# Patient Record
Sex: Female | Born: 1965
Health system: Southern US, Community
[De-identification: ages and names within clinical notes are randomized; demographics above are authoritative.]

## PROBLEM LIST (undated history)

## (undated) DIAGNOSIS — R251 Tremor, unspecified: Secondary | ICD-10-CM

## (undated) DIAGNOSIS — F32A Depression, unspecified: Secondary | ICD-10-CM

## (undated) DIAGNOSIS — R519 Headache, unspecified: Secondary | ICD-10-CM

## (undated) DIAGNOSIS — Z87448 Personal history of other diseases of urinary system: Secondary | ICD-10-CM

## (undated) DIAGNOSIS — T4145XA Adverse effect of unspecified anesthetic, initial encounter: Secondary | ICD-10-CM

## (undated) DIAGNOSIS — G709 Myoneural disorder, unspecified: Secondary | ICD-10-CM

## (undated) DIAGNOSIS — G629 Polyneuropathy, unspecified: Secondary | ICD-10-CM

## (undated) DIAGNOSIS — R945 Abnormal results of liver function studies: Secondary | ICD-10-CM

## (undated) DIAGNOSIS — A6920 Lyme disease, unspecified: Secondary | ICD-10-CM

## (undated) DIAGNOSIS — G8929 Other chronic pain: Secondary | ICD-10-CM

## (undated) DIAGNOSIS — R51 Headache: Secondary | ICD-10-CM

## (undated) DIAGNOSIS — D472 Monoclonal gammopathy: Secondary | ICD-10-CM

## (undated) DIAGNOSIS — M545 Low back pain: Secondary | ICD-10-CM

## (undated) DIAGNOSIS — K219 Gastro-esophageal reflux disease without esophagitis: Secondary | ICD-10-CM

## (undated) DIAGNOSIS — G4769 Other sleep related movement disorders: Secondary | ICD-10-CM

## (undated) DIAGNOSIS — C50311 Malignant neoplasm of lower-inner quadrant of right female breast: Secondary | ICD-10-CM

## (undated) DIAGNOSIS — E785 Hyperlipidemia, unspecified: Secondary | ICD-10-CM

## (undated) DIAGNOSIS — R112 Nausea with vomiting, unspecified: Secondary | ICD-10-CM

## (undated) DIAGNOSIS — Z923 Personal history of irradiation: Secondary | ICD-10-CM

## (undated) DIAGNOSIS — R7989 Other specified abnormal findings of blood chemistry: Secondary | ICD-10-CM

## (undated) DIAGNOSIS — F419 Anxiety disorder, unspecified: Secondary | ICD-10-CM

## (undated) DIAGNOSIS — R0600 Dyspnea, unspecified: Secondary | ICD-10-CM

## (undated) DIAGNOSIS — I671 Cerebral aneurysm, nonruptured: Secondary | ICD-10-CM

## (undated) DIAGNOSIS — Z800 Family history of malignant neoplasm of digestive organs: Secondary | ICD-10-CM

## (undated) DIAGNOSIS — N189 Chronic kidney disease, unspecified: Secondary | ICD-10-CM

## (undated) DIAGNOSIS — C50919 Malignant neoplasm of unspecified site of unspecified female breast: Secondary | ICD-10-CM

## (undated) DIAGNOSIS — T7840XA Allergy, unspecified, initial encounter: Secondary | ICD-10-CM

## (undated) DIAGNOSIS — M1990 Unspecified osteoarthritis, unspecified site: Secondary | ICD-10-CM

## (undated) DIAGNOSIS — T8859XA Other complications of anesthesia, initial encounter: Secondary | ICD-10-CM

## (undated) DIAGNOSIS — G4733 Obstructive sleep apnea (adult) (pediatric): Secondary | ICD-10-CM

## (undated) DIAGNOSIS — C649 Malignant neoplasm of unspecified kidney, except renal pelvis: Secondary | ICD-10-CM

## (undated) DIAGNOSIS — I10 Essential (primary) hypertension: Secondary | ICD-10-CM

## (undated) DIAGNOSIS — F329 Major depressive disorder, single episode, unspecified: Secondary | ICD-10-CM

## (undated) DIAGNOSIS — Z803 Family history of malignant neoplasm of breast: Secondary | ICD-10-CM

## (undated) DIAGNOSIS — Z8719 Personal history of other diseases of the digestive system: Secondary | ICD-10-CM

## (undated) DIAGNOSIS — R2689 Other abnormalities of gait and mobility: Secondary | ICD-10-CM

## (undated) DIAGNOSIS — G250 Essential tremor: Secondary | ICD-10-CM

## (undated) DIAGNOSIS — Z9889 Other specified postprocedural states: Secondary | ICD-10-CM

## (undated) DIAGNOSIS — K5900 Constipation, unspecified: Secondary | ICD-10-CM

## (undated) DIAGNOSIS — Z8489 Family history of other specified conditions: Secondary | ICD-10-CM

## (undated) DIAGNOSIS — R42 Dizziness and giddiness: Secondary | ICD-10-CM

## (undated) DIAGNOSIS — M797 Fibromyalgia: Secondary | ICD-10-CM

## (undated) DIAGNOSIS — K589 Irritable bowel syndrome without diarrhea: Secondary | ICD-10-CM

## (undated) DIAGNOSIS — Z1379 Encounter for other screening for genetic and chromosomal anomalies: Secondary | ICD-10-CM

## (undated) DIAGNOSIS — R269 Unspecified abnormalities of gait and mobility: Secondary | ICD-10-CM

## (undated) DIAGNOSIS — H532 Diplopia: Secondary | ICD-10-CM

## (undated) HISTORY — DX: Monoclonal gammopathy: D47.2

## (undated) HISTORY — PX: BREAST SURGERY: SHX581

## (undated) HISTORY — DX: Encounter for other screening for genetic and chromosomal anomalies: Z13.79

## (undated) HISTORY — DX: Family history of malignant neoplasm of digestive organs: Z80.0

## (undated) HISTORY — PX: KNEE ARTHROSCOPY W/ ACL RECONSTRUCTION: SHX1858

## (undated) HISTORY — PX: ABDOMINAL HYSTERECTOMY: SHX81

## (undated) HISTORY — DX: Chronic kidney disease, unspecified: N18.9

## (undated) HISTORY — DX: Other sleep related movement disorders: G47.69

## (undated) HISTORY — DX: Malignant neoplasm of unspecified site of unspecified female breast: C50.919

## (undated) HISTORY — DX: Hyperlipidemia, unspecified: E78.5

## (undated) HISTORY — DX: Low back pain: M54.5

## (undated) HISTORY — PX: WRIST SURGERY: SHX841

## (undated) HISTORY — DX: Other chronic pain: G89.29

## (undated) HISTORY — PX: BLADDER SURGERY: SHX569

## (undated) HISTORY — DX: Lyme disease, unspecified: A69.20

## (undated) HISTORY — DX: Malignant neoplasm of lower-inner quadrant of right female breast: C50.311

## (undated) HISTORY — DX: Unspecified osteoarthritis, unspecified site: M19.90

## (undated) HISTORY — DX: Essential tremor: G25.0

## (undated) HISTORY — DX: Myoneural disorder, unspecified: G70.9

## (undated) HISTORY — PX: KNEE ARTHROSCOPY: SHX127

## (undated) HISTORY — DX: Diplopia: H53.2

## (undated) HISTORY — PX: BREAST BIOPSY: SHX20

## (undated) HISTORY — DX: Family history of malignant neoplasm of breast: Z80.3

## (undated) HISTORY — PX: OTHER SURGICAL HISTORY: SHX169

## (undated) HISTORY — DX: Unspecified abnormalities of gait and mobility: R26.9

## (undated) HISTORY — DX: Allergy, unspecified, initial encounter: T78.40XA

## (undated) HISTORY — DX: Dizziness and giddiness: R42

## (undated) HISTORY — PX: FRACTURE SURGERY: SHX138

## (undated) HISTORY — PX: BONE GRAFT HIP ILIAC CREST: SUR159

## (undated) HISTORY — PX: BREAST LUMPECTOMY: SHX2

## (undated) HISTORY — PX: CHOLECYSTECTOMY: SHX55

---

## 1998-12-02 ENCOUNTER — Other Ambulatory Visit: Admission: RE | Admit: 1998-12-02 | Discharge: 1998-12-02 | Payer: Self-pay

## 1999-12-21 ENCOUNTER — Encounter: Payer: Self-pay | Admitting: Family Medicine

## 1999-12-21 ENCOUNTER — Encounter: Admission: RE | Admit: 1999-12-21 | Discharge: 1999-12-21 | Payer: Self-pay | Admitting: Family Medicine

## 2000-08-30 ENCOUNTER — Encounter: Admission: RE | Admit: 2000-08-30 | Discharge: 2000-08-30 | Payer: Self-pay | Admitting: Family Medicine

## 2000-08-30 ENCOUNTER — Encounter: Payer: Self-pay | Admitting: Family Medicine

## 2001-11-07 ENCOUNTER — Other Ambulatory Visit: Admission: RE | Admit: 2001-11-07 | Discharge: 2001-11-07 | Payer: Self-pay | Admitting: Family Medicine

## 2002-01-30 ENCOUNTER — Encounter: Payer: Self-pay | Admitting: Family Medicine

## 2002-01-30 ENCOUNTER — Encounter: Admission: RE | Admit: 2002-01-30 | Discharge: 2002-01-30 | Payer: Self-pay | Admitting: Family Medicine

## 2002-02-02 ENCOUNTER — Encounter: Payer: Self-pay | Admitting: Family Medicine

## 2002-02-02 ENCOUNTER — Encounter: Admission: RE | Admit: 2002-02-02 | Discharge: 2002-02-02 | Payer: Self-pay | Admitting: Family Medicine

## 2005-05-02 ENCOUNTER — Emergency Department (HOSPITAL_COMMUNITY): Admission: EM | Admit: 2005-05-02 | Discharge: 2005-05-02 | Payer: Self-pay | Admitting: Family Medicine

## 2005-08-27 ENCOUNTER — Ambulatory Visit (HOSPITAL_COMMUNITY): Admission: RE | Admit: 2005-08-27 | Discharge: 2005-08-28 | Payer: Self-pay | Admitting: Surgery

## 2006-04-16 ENCOUNTER — Ambulatory Visit: Admission: RE | Admit: 2006-04-16 | Discharge: 2006-04-16 | Payer: Self-pay | Admitting: Family Medicine

## 2006-05-01 ENCOUNTER — Ambulatory Visit: Payer: Self-pay | Admitting: Pulmonary Disease

## 2007-02-07 ENCOUNTER — Ambulatory Visit (HOSPITAL_COMMUNITY): Admission: RE | Admit: 2007-02-07 | Discharge: 2007-02-08 | Payer: Self-pay | Admitting: Specialist

## 2012-02-12 ENCOUNTER — Emergency Department (HOSPITAL_COMMUNITY)
Admission: EM | Admit: 2012-02-12 | Discharge: 2012-02-12 | Disposition: A | Discharge status: Home / Self Care | Payer: Self-pay | Attending: Emergency Medicine | Admitting: Emergency Medicine

## 2012-02-12 ENCOUNTER — Encounter (HOSPITAL_COMMUNITY): Payer: Self-pay | Admitting: *Deleted

## 2012-02-12 ENCOUNTER — Emergency Department (HOSPITAL_COMMUNITY): Payer: Self-pay

## 2012-02-12 DIAGNOSIS — M25519 Pain in unspecified shoulder: Secondary | ICD-10-CM | POA: Insufficient documentation

## 2012-02-12 DIAGNOSIS — W010XXA Fall on same level from slipping, tripping and stumbling without subsequent striking against object, initial encounter: Secondary | ICD-10-CM | POA: Insufficient documentation

## 2012-02-12 DIAGNOSIS — S43401A Unspecified sprain of right shoulder joint, initial encounter: Secondary | ICD-10-CM

## 2012-02-12 DIAGNOSIS — G4733 Obstructive sleep apnea (adult) (pediatric): Secondary | ICD-10-CM | POA: Insufficient documentation

## 2012-02-12 DIAGNOSIS — IMO0002 Reserved for concepts with insufficient information to code with codable children: Secondary | ICD-10-CM | POA: Insufficient documentation

## 2012-02-12 DIAGNOSIS — F329 Major depressive disorder, single episode, unspecified: Secondary | ICD-10-CM | POA: Insufficient documentation

## 2012-02-12 DIAGNOSIS — IMO0001 Reserved for inherently not codable concepts without codable children: Secondary | ICD-10-CM | POA: Insufficient documentation

## 2012-02-12 DIAGNOSIS — F3289 Other specified depressive episodes: Secondary | ICD-10-CM | POA: Insufficient documentation

## 2012-02-12 DIAGNOSIS — I10 Essential (primary) hypertension: Secondary | ICD-10-CM | POA: Insufficient documentation

## 2012-02-12 HISTORY — DX: Fibromyalgia: M79.7

## 2012-02-12 HISTORY — DX: Obstructive sleep apnea (adult) (pediatric): G47.33

## 2012-02-12 HISTORY — DX: Essential (primary) hypertension: I10

## 2012-02-12 HISTORY — DX: Depression, unspecified: F32.A

## 2012-02-12 HISTORY — DX: Major depressive disorder, single episode, unspecified: F32.9

## 2012-02-12 MED ORDER — HYDROCODONE-ACETAMINOPHEN 5-325 MG PO TABS
1.0000 | ORAL_TABLET | Freq: Once | ORAL | Status: AC
  Administered 2012-02-12: 1 via ORAL
  Filled 2012-02-12: qty 1

## 2012-02-12 MED ORDER — HYDROCODONE-ACETAMINOPHEN 5-325 MG PO TABS: 1.0000 | ORAL_TABLET | ORAL | Status: AC | PRN

## 2012-02-12 NOTE — Discharge Instructions (Signed)
Joint Sprain A sprain is a tear or stretch in the ligaments that hold a joint together. Severe sprains may need as long as 3-6 weeks of immobilization and/or exercises to heal completely. Sprained joints should be rested and protected. If not, they can become unstable and prone to re-injury. Proper treatment can reduce your pain, shorten the period of disability, and reduce the risk of repeated injuries. TREATMENT   Rest and elevate the injured joint to reduce pain and swelling.   Apply ice packs to the injury for 20-30 minutes every 2-3 hours for the next 2-3 days.   Keep the injury wrapped in a compression bandage or splint as long as the joint is painful or as instructed by your caregiver.   Do not use the injured joint until it is completely healed to prevent re-injury and chronic instability. Follow the instructions of your caregiver.   Long-term sprain management may require exercises and/or treatment by a physical therapist. Taping or special braces may help stabilize the joint until it is completely better.  SEEK MEDICAL CARE IF:   You develop increased pain or swelling of the joint.   You develop increasing redness and warmth of the joint.   You develop a fever.   It becomes stiff.   Your hand or foot gets cold or numb.  Document Released: 11/15/2004 Document Revised: 09/27/2011 Document Reviewed: 10/25/2008 [facility] Patient Information 2012 [place], [place].   Using medication prescribed for pain, use caution as this will make you drowsy.  Do not drive within 4 taking hydrocodone.  Use the sling for comfort and continue using ice to your shoulder for the next 24 hours as much as is comfortable.  Called Dr. Hilda Lias for further management if your symptoms persist beyond the next week.  Your x-rays are negative today.

## 2012-02-12 NOTE — ED Notes (Signed)
Pt c/o pain in her right shoulder and upper arm. States that it is difficult to raise her arm up.

## 2012-02-13 NOTE — ED Provider Notes (Signed)
History     CSN: [id number]  Arrival date & time 02/12/12  1556   First MD Initiated Contact with Patient 02/12/12 1649      Chief Complaint  Patient presents with  . Fall    (Consider location/radiation/quality/duration/timing/severity/associated sxs/prior treatment) HPI Comments: Patient fell this am while attempting to walk through her dark room to use the bathroom.  She landed on her right side with her right arm outstretched above her.  She has increasing pain and decreased ROM of her right shoulder.  Pain is constant ,  Throbbing and does not radiate.  She denies weakness in her forearm or hand,  No numbness or tingling.  She denies any other injury except for minor abrasion on right volar forearm.  Patient is a 46 y.o. female presenting with fall. The history is provided by the patient.  Fall    Past Medical History  Diagnosis Date  . Fibromyalgia   . Depression   . OSA (obstructive sleep apnea)   . Hypertension     Past Surgical History  Procedure Date  . Bone graft hip iliac crest   . Abdominal hysterectomy   . Bladder surgery   . Knee arthroscopy w/ acl reconstruction     right  . Cholecystectomy     History reviewed. No pertinent family history.  History  Substance Use Topics  . Smoking status: Never Smoker   . Smokeless tobacco: Not on file  . Alcohol Use: No    OB History    Grav Para Term Preterm Abortions TAB SAB Ect Mult Living                  Review of Systems  Musculoskeletal: Positive for arthralgias. Negative for joint swelling.  Skin: Negative for wound.  Neurological: Negative for weakness.  All other systems reviewed and are negative.    Allergies  Altace; Erythromycin; Minocin; Shrimp; Drixoral; and Sinutab  Home Medications   Current Outpatient Rx  Name Route Sig Dispense Refill  . ACETAMINOPHEN 500 MG PO TABS Oral Take 500 mg by mouth as needed. For pain    . ATENOLOL 50 MG PO TABS Oral Take 50 mg by mouth daily.    Marland Kitchen  CETIRIZINE HCL 10 MG PO TABS Oral Take 10 mg by mouth daily.    Marland Kitchen CLONAZEPAM 1 MG PO TABS Oral Take 1 mg by mouth 2 (two) times daily as needed. For anxiety    . FLUOXETINE HCL 20 MG PO CAPS Oral Take 20 mg by mouth daily.    Marland Kitchen HYDROCHLOROTHIAZIDE 25 MG PO TABS Oral Take 25 mg by mouth daily.    . IBUPROFEN 200 MG PO TABS Oral Take 400 mg by mouth as needed. For pain    . TRAMADOL HCL 50 MG PO TABS Oral Take 50-100 mg by mouth every 6 (six) hours as needed. Every 4 to 6 hours as needed for pain    . HYDROCODONE-ACETAMINOPHEN 5-325 MG PO TABS Oral Take 1 tablet by mouth every 4 (four) hours as needed for pain. 15 tablet 0    BP 127/87  Pulse 99  Temp(Src) 98.4 F (36.9 C) (Oral)  Resp 20  Ht 5' 8.5" (1.74 m)  Wt 212 lb (96.163 kg)  BMI 31.77 kg/m2  SpO2 96%  Physical Exam  Nursing note and vitals reviewed. Constitutional: She appears well-developed and well-nourished.  HENT:  Head: Normocephalic.  Cardiovascular: Normal rate and intact distal pulses.  Exam reveals no decreased pulses.  Pulses:      Radial pulses are 2+ on the right side, and 2+ on the left side.  Musculoskeletal: She exhibits tenderness. She exhibits no edema.       Right shoulder: She exhibits decreased range of motion, tenderness and decreased strength. She exhibits no swelling, no effusion, no crepitus, no deformity, no spasm and normal pulse.       ttp right upper anterior arm over upper bicep muscle through shoulder anterior bicep tendon insertion site.  Increased pain with attempted resistance to right elbow flexion,  No appreciable decreased strength.  No palpable deformity along length of bicep muscle.  Neurological: She is alert. No sensory deficit.  Skin: Skin is warm, dry and intact.    ED Course  Procedures (including critical care time)  Labs Reviewed - No data to display Dg Shoulder Right  02/12/2012  *RADIOLOGY REPORT*  Clinical Data: Fall on outstretched arm, right shoulder pain  RIGHT SHOULDER  - 2+ VIEW  Comparison: None.  Findings: Right lung apex is clear.  No fracture or dislocation. No soft tissue abnormality.  IMPRESSION: No acute osseous abnormality.  Original Report Authenticated By: Harrel Lemon, M.D.     1. Sprain of right shoulder       MDM  xrays reviewed and negative.  Suspect shoulder/bicep sprain.  Sling provided,  Rest,  Ice,  Hydrocodone.  Recheck by ortho if not improved over 1 week.        Candis Musa, PA 02/13/12 1346  Candis Musa, PA 02/13/12 1347

## 2012-02-16 NOTE — ED Provider Notes (Signed)
Medical screening examination/treatment/procedure(s) were performed by non-physician practitioner and as supervising physician I was immediately available for consultation/collaboration.   Shelda Jakes, MD 02/16/12 1902

## 2014-10-21 ENCOUNTER — Other Ambulatory Visit: Payer: Self-pay

## 2014-10-21 DIAGNOSIS — Z1231 Encounter for screening mammogram for malignant neoplasm of breast: Secondary | ICD-10-CM

## 2014-11-02 ENCOUNTER — Other Ambulatory Visit: Payer: Self-pay

## 2014-11-02 ENCOUNTER — Ambulatory Visit: Payer: Self-pay

## 2014-11-02 ENCOUNTER — Ambulatory Visit
Admission: RE | Admit: 2014-11-02 | Discharge: 2014-11-02 | Disposition: A | Discharge status: Home / Self Care | Payer: BC Managed Care – PPO | Source: Ambulatory Visit

## 2014-11-02 DIAGNOSIS — Z1231 Encounter for screening mammogram for malignant neoplasm of breast: Secondary | ICD-10-CM

## 2014-11-04 ENCOUNTER — Other Ambulatory Visit: Payer: Self-pay | Admitting: Nurse Practitioner

## 2014-11-04 DIAGNOSIS — R928 Other abnormal and inconclusive findings on diagnostic imaging of breast: Secondary | ICD-10-CM

## 2014-11-15 ENCOUNTER — Other Ambulatory Visit: Payer: Self-pay | Admitting: Nurse Practitioner

## 2014-11-15 ENCOUNTER — Ambulatory Visit
Admission: RE | Admit: 2014-11-15 | Discharge: 2014-11-15 | Disposition: A | Discharge status: Home / Self Care | Payer: BC Managed Care – PPO | Source: Ambulatory Visit | Attending: Nurse Practitioner | Admitting: Nurse Practitioner

## 2014-11-15 DIAGNOSIS — R928 Other abnormal and inconclusive findings on diagnostic imaging of breast: Secondary | ICD-10-CM

## 2014-11-25 ENCOUNTER — Ambulatory Visit
Admission: RE | Admit: 2014-11-25 | Discharge: 2014-11-25 | Disposition: A | Discharge status: Home / Self Care | Payer: BC Managed Care – PPO | Source: Ambulatory Visit | Attending: Nurse Practitioner | Admitting: Nurse Practitioner

## 2014-11-25 ENCOUNTER — Other Ambulatory Visit: Payer: Self-pay | Admitting: Nurse Practitioner

## 2014-11-25 DIAGNOSIS — R928 Other abnormal and inconclusive findings on diagnostic imaging of breast: Secondary | ICD-10-CM

## 2015-02-01 ENCOUNTER — Encounter: Payer: Self-pay | Admitting: Neurology

## 2015-02-01 ENCOUNTER — Ambulatory Visit (INDEPENDENT_AMBULATORY_CARE_PROVIDER_SITE_OTHER): Payer: BC Managed Care – PPO | Admitting: Neurology

## 2015-02-01 VITALS — BP 118/72 | HR 69 | Ht 68.5 in | Wt 237.0 lb

## 2015-02-01 DIAGNOSIS — M542 Cervicalgia: Secondary | ICD-10-CM | POA: Diagnosis not present

## 2015-02-01 DIAGNOSIS — R29818 Other symptoms and signs involving the nervous system: Secondary | ICD-10-CM

## 2015-02-01 DIAGNOSIS — G250 Essential tremor: Secondary | ICD-10-CM

## 2015-02-01 DIAGNOSIS — M25569 Pain in unspecified knee: Secondary | ICD-10-CM | POA: Diagnosis not present

## 2015-02-01 DIAGNOSIS — R7989 Other specified abnormal findings of blood chemistry: Secondary | ICD-10-CM

## 2015-02-01 DIAGNOSIS — R2689 Other abnormalities of gait and mobility: Secondary | ICD-10-CM

## 2015-02-01 DIAGNOSIS — M797 Fibromyalgia: Secondary | ICD-10-CM

## 2015-02-01 DIAGNOSIS — R945 Abnormal results of liver function studies: Secondary | ICD-10-CM

## 2015-02-01 MED ORDER — GABAPENTIN 300 MG PO CAPS: ORAL_CAPSULE | ORAL | Status: DC

## 2015-02-01 NOTE — Progress Notes (Signed)
Subjective:   Sarah Cooley was seen in consultation in the movement disorder clinic at the request of [facility], NP.  The evaluation is for tremor.  The patient is a 49 y.o. right handed female with a history of tremor.  Pt states that the tremor is in both hands and in the head.  Pt states that it started in the hands more than 10 years ago and started in the head more recently.  Friends notice the head tremor more than she does.  She states that in January cymbalta was added for fibromyalgia and IBS and fluoxetine was d/c and wellbutrin was also added.  She returned a month later and was c/o balance issues, constipation, sexual dysfunction, increased fibromyalgia pain, urinary retention.  Her meds were changed back.  The cymbalta and wellbutrin were d/c at the end of feb.  The tremor has not changed at all.  She still has concentration difficulty.  Her balance issues seem to come and go.  She still has some bladder dysfunction, but that is better.  She states that she has been on seroquel in the past and sounds like she had "uncontrollable movements."   There is a family hx of tremor in her mother.  She states that her mother has "demyelination" in her back.    Affected by caffeine:  No.  (1 large cup per day) Affected by alcohol:  No. (2-3 times per year) Affected by stress:  Yes.   Affected by fatigue:  Yes.   Spills soup if on spoon:  Yes.   Spills glass of liquid if full:  Yes.   Affects ADL's (tying shoes, brushing teeth, etc):  No. (does have some trouble with makeup)  Current/Previously tried tremor medications: n/a  Current medications that may exacerbate tremor:  n/a  I reviewed the patients MRI of the brain dated 01/08/15.  There were very rare T2 hyperintensities.  Pt worried that she has MS.    Outside reports reviewed: historical medical records, lab reports and x-ray reports.  Allergies  Allergen Reactions  . Erythromycin Other (See Comments)    GI- Upset   .  Minocycline Hcl Hives  . Ramipril Other (See Comments)    Ramipril--Caused Pancreatitis  . Shrimp [Shellfish Allergy] Swelling  . Drixoral [Brompheniramine-Pseudoeph] Rash  . Sinutab [Chlorphen-Pseudoephed-Apap] Rash    Outpatient Encounter Prescriptions as of 02/01/2015  Medication Sig  . acetaminophen (TYLENOL) 500 MG tablet Take 500 mg by mouth as needed. For pain  . atenolol (TENORMIN) 50 MG tablet Take 50 mg by mouth 2 (two) times daily.   Marland Kitchen atorvastatin (LIPITOR) 20 MG tablet   . cetirizine (ZYRTEC) 10 MG tablet Take 10 mg by mouth daily.  . clonazePAM (KLONOPIN) 1 MG tablet Take 1 mg by mouth 3 (three) times daily as needed. For anxiety  . diclofenac (VOLTAREN) 75 MG EC tablet Take 75 mg by mouth 2 (two) times daily.  . ergocalciferol (VITAMIN D2) 50000 UNITS capsule Take 50,000 Units by mouth once a week.  Marland Kitchen FLUoxetine (PROZAC) 20 MG capsule Take 40 mg by mouth daily.   Marland Kitchen gabapentin (NEURONTIN) 100 MG capsule Take 100 mg by mouth 2 (two) times daily.  . hydrochlorothiazide (HYDRODIURIL) 25 MG tablet Take 25 mg by mouth daily.  Marland Kitchen ibuprofen (ADVIL,MOTRIN) 200 MG tablet Take 400 mg by mouth as needed. For pain  . [DISCONTINUED] traMADol (ULTRAM) 50 MG tablet Take 50-100 mg by mouth every 6 (six) hours as needed. Every 4 to 6 hours  as needed for pain    Past Medical History  Diagnosis Date  . Fibromyalgia   . Depression   . OSA (obstructive sleep apnea)   . Hypertension   . Hyperlipidemia     Past Surgical History  Procedure Laterality Date  . Bone graft hip iliac crest    . Abdominal hysterectomy    . Bladder surgery    . Knee arthroscopy w/ acl reconstruction      right  . Cholecystectomy      History   Social History  . Marital Status: Married    Spouse Name: N/A  . Number of Children: N/A  . Years of Education: N/A   Occupational History  . physician office    Social History Main Topics  . Smoking status: Never Smoker   . Smokeless tobacco: Not on file    . Alcohol Use: 0.0 oz/week    0 Standard drinks or equivalent per week     Comment: 2-3 times a year  . Drug Use: No  . Sexual Activity: Not on file   Other Topics Concern  . Not on file   Social History Narrative    Family Status  Relation Status Death Age  . Mother Alive     tremor, DJD, lichen planus  . Father Alive     HTN, hyperlipidemia  . Sister Alive     SLE  . Brother Alive     healthy, depression  . Son Alive     1, bipolar  . Daughter Alive     1, healthy    Review of Systems C/o inverting words and numbers.  Complains about neck pain.  Complains about pain in the thigh.   A complete 10 system ROS was obtained and was negative apart from what is mentioned.   Objective:   VITALS:   Filed Vitals:   02/01/15 0908  BP: 118/72  Pulse: 69  Height: 5' 8.5" (1.74 m)  Weight: 237 lb (107.502 kg)  SpO2: 98%   Gen:  Appears stated age and in NAD. HEENT:  Normocephalic, atraumatic. The mucous membranes are moist. The superficial temporal arteries are without ropiness or tenderness. Cardiovascular: Regular rate and rhythm. Lungs: Clear to auscultation bilaterally. Neck: There are no carotid bruits noted bilaterally.  NEUROLOGICAL:  Orientation:  The patient is alert and oriented x 3.  Recent and remote memory are intact.  Attention span and concentration are normal.  Able to name objects and repeat without trouble.  Fund of knowledge is appropriate Cranial nerves: There is good facial symmetry. The pupils are equal round and reactive to light bilaterally. Fundoscopic exam reveals clear disc margins bilaterally. Extraocular muscles are intact and visual fields are full to confrontational testing. Speech is fluent and clear. Soft palate rises symmetrically and there is no tongue deviation. Hearing is intact to conversational tone. Tone: Tone is good throughout. Sensation: Sensation is intact to light touch and pinprick throughout (facial, trunk, extremities).  Vibration is intact at the bilateral big toe. There is no extinction with double simultaneous stimulation. There is no sensory dermatomal level identified. Coordination:  The patient has no dysdiadichokinesia or dysmetria. Motor: Strength is 5/5 in the bilateral upper and lower extremities.  Shoulder shrug is equal bilaterally.  There is no pronator drift.  There are no fasciculations noted. DTR's: Deep tendon reflexes are 2+/4 at the bilateral biceps, triceps, brachioradialis, patella and achilles.  Plantar responses are downgoing bilaterally. Gait and Station: The patient is able to ambulate  without difficulty. The patient is able to heel toe walk without any difficulty. The patient has minimal difficulty in relating in a tandem fashion. The patient is able to stand in the Romberg position.   MOVEMENT EXAM: Tremor:  There is tremor of the outstretched hands.  It is not worse with intention, and is primarily a postural tremor.  It is worse on the right than the left.  She has head tremor in the "no" direction.  There is no null point.  She has no significant difficulty with Archimedes spirals.  Tremor is evident when she pours water from one glass to another, but she does not spill it.  Labs:  Labs were reviewed from her primary care physician office.  Her AST was 63 and ALT was 81.  Patient states that because of these elevations her cholesterol medication was changed from Pravachol to Lipitor.  Her TSH was normal at 1.590.     Assessment/Plan:   1.  Essential Tremor.  -This is evidenced by the symmetrical nature and longstanding hx of gradually getting worse.  In addition, she has a family history of tremor.  She has both head and hand tremor.  I did explain to her that head tremor can be quite resistant to medication treatment.  We talked about.  His medications, and ultimately decided to just use the gabapentin that she was recently placed on for her fibromyalgia and try to increase that and  see if it would be helpful for tremor.  She would need a much higher dosage.  The other benefit of using gabapentin is that it will not further increase her liver enzymes, which were already elevated.  I gave her a schedule to titrate the medication up.  She is currently on 100 mg twice a day and over the next 3 weeks I will work her up to 300 mg in the morning and 600 mg at night.  She will let me know if she has any side effects with the medication.  Risks, benefits, side effects and alternative therapies were discussed.  The opportunity to ask questions was given and they were answered to the best of my ability.  The patient expressed understanding and willingness to follow the outlined treatment protocols.  -reviewed patients MRI brain with her.  Was a very rare focus of T2 hyperintesties, not uncommon in her age group in a patient with HTN, hyperlipidemia.  Does not look suspicious for demyelinating disease.  I told her that I see no evidence on her examination that she has demyelinating disease, which she was very worried about.  Reassurance was provided. 2.  Loss of balance.  -The patient asked me several questions about what could be causing the other issues that she complained about.  Again, I saw no evidence of any type of demyelinating disease.  Her examination was nonfocal and nonlateralizing.  I did tell her that I wonder if it is not from her clonazepam, which she has been on when necessary for a long time, but has recently been taking it 3 times a day.  This could potentially cause cognitive difficulties as well as balance changes.  I told her that we could certainly do a workup and look at her MRI of the cervical spine since she does complain about neck pain.  She would also like to pursue an EMG to make sure that there is nothing peripheral going on.  I think that is reasonable, but otherwise I see nothing else neurologic that could  be causing her complaints. 3.  Fibromyalgia  -This is  long-standing and she is under the care of her primary care physician for this. 4.  She will follow-up with me in the next few months, sooner should new neurologic issues arise.

## 2015-02-01 NOTE — Patient Instructions (Signed)
1.  Take gabapentin as follows:  100 mg in the AM, 300 mg in the Pm for a week, then 300 mg twice a day for a week, then 300 mg in the AM and 600 mg in the PM thereafter

## 2015-02-14 ENCOUNTER — Ambulatory Visit (INDEPENDENT_AMBULATORY_CARE_PROVIDER_SITE_OTHER): Payer: BC Managed Care – PPO | Admitting: Neurology

## 2015-02-14 ENCOUNTER — Telehealth: Payer: Self-pay | Admitting: Neurology

## 2015-02-14 DIAGNOSIS — R202 Paresthesia of skin: Secondary | ICD-10-CM

## 2015-02-14 DIAGNOSIS — M542 Cervicalgia: Secondary | ICD-10-CM | POA: Diagnosis not present

## 2015-02-14 DIAGNOSIS — R269 Unspecified abnormalities of gait and mobility: Secondary | ICD-10-CM

## 2015-02-14 DIAGNOSIS — M25569 Pain in unspecified knee: Secondary | ICD-10-CM

## 2015-02-14 NOTE — Telephone Encounter (Signed)
-----   Message from [place], DO sent at 02/14/2015  3:24 PM EDT ----- Please let pt know that her EMG was normal; no evidence of pinched nerves from the back.  No evidence of peripheral neuropathy.  I don't know cause of her other sx's but not likely neurologic

## 2015-02-14 NOTE — Procedures (Signed)
[facility]  [address], [place]  [place], [place] [address] Tel: [phone] Fax:  [phone] Test Date:  02/14/2015  Patient: Sarah Cooley DOB: 12-Nov-1965 Physician: Narda Amber, DO  Sex: Female Height: 5' 8.5" Ref Phys: Alonza Bogus  ID#: [id number] Temp: 34.0C Technician: Laureen Ochs R. NCS T.   Patient Complaints: Patient is a 49 year old female here for evaluaiton of both her lower extremities for balance changes and pain.  NCV & EMG Findings: Extensive electrodiagnostic testing of the left lower extremity and additional studies of the right shows:  1. Bilateral sural and superficial peroneal sensory responses are within normal limits. 2. Bilateral tibial and peroneal motor responses are within normal limits. 3. Bilateral H reflex studies are within normal limits. 4. There is no evidence of active or chronic motor axon loss changes affecting any of the tested muscles. Motor unit configuration and recruitment pattern is within normal limits.  Impression: This is a normal study. In particular, there is no evidence of a generalized large fiber sensorimotor polyneuropathy or lumbosacral radiculopathy affecting the lower extremities.   ___________________________ Narda Amber, DO    Nerve Conduction Studies Anti Sensory Summary Table   Site NR Peak (ms) Norm Peak (ms) P-T Amp (V) Norm P-T Amp  Left Sup Peroneal Anti Sensory (Ant Lat Mall)  34C  12 cm    2.6 <4.5 12.4 >5  Right Sup Peroneal Anti Sensory (Ant Lat Mall)  34C  12 cm    2.7 <4.5 12.9 >5  Left Sural Anti Sensory (Lat Mall)  34C  Calf    3.6 <4.5 8.5 >5  Right Sural Anti Sensory (Lat Mall)  34C  Calf    3.5 <4.5 10.6 >5   Motor Summary Table   Site NR Onset (ms) Norm Onset (ms) O-P Amp (mV) Norm O-P Amp Site1 Site2 Delta-0 (ms) Dist (cm) Vel (m/s) Norm Vel (m/s)  Left Peroneal Motor (Ext Dig Brev)  34C  Ankle    3.4 <5.5 4.8 >3 B Fib Ankle 7.5 35.0 47 >40  B Fib    10.9  4.6   Poplt B Fib 2.1 10.0 48 >40  Poplt    13.0  4.5         Right Peroneal Motor (Ext Dig Brev)  34C  Ankle    3.5 <5.5 6.0 >3 B Fib Ankle 7.0 35.5 51 >40  B Fib    10.5  5.7  Poplt B Fib 1.5 8.0 53 >40  Poplt    12.0  5.7         Left Tibial Motor (Abd Hall Brev)  34C  Ankle    3.7 <6.0 12.9 >8 Knee Ankle 7.9 39.0 49 >40  Knee    11.6  8.5         Right Tibial Motor (Abd Hall Brev)  34C  Ankle    3.4 <6.0 12.3 >8 Knee Ankle 8.0 41.0 51 >40  Knee    11.4  9.0          H Reflex Studies   NR H-Lat (ms) Lat Norm (ms) L-R H-Lat (ms)  Left Tibial (Gastroc)  34C     33.33 <35 0.00  Right Tibial (Gastroc)  34C     33.33 <35 0.00   EMG   Side Muscle Ins Act Fibs Psw Fasc Number Recrt Dur Dur. Amp Amp. Poly Poly. Comment  Left AntTibialis Nml Nml Nml Nml Nml Nml Nml Nml Nml Nml Nml Nml N/A  Left Gastroc Nml Nml Nml Nml Nml Nml Nml Nml Nml Nml Nml Nml N/A  Left Flex Dig Long Nml Nml Nml Nml Nml Nml Nml Nml Nml Nml Nml Nml N/A  Left RectFemoris Nml Nml Nml Nml Nml Nml Nml Nml Nml Nml Nml Nml N/A  Left GluteusMed Nml Nml Nml Nml Nml Nml Nml Nml Nml Nml Nml Nml N/A  Right AntTibialis Nml Nml Nml Nml Nml Nml Nml Nml Nml Nml Nml Nml N/A  Right Gastroc Nml Nml Nml Nml Nml Nml Nml Nml Nml Nml Nml Nml N/A  Right RectFemoris Nml Nml Nml Nml Nml Nml Nml Nml Nml Nml Nml Nml N/A      Waveforms:

## 2015-02-14 NOTE — Telephone Encounter (Signed)
Left message on machine for patient to call back.

## 2015-02-16 ENCOUNTER — Telehealth: Payer: Self-pay | Admitting: *Deleted

## 2015-02-16 NOTE — Telephone Encounter (Signed)
Left another message on machine for patient to call back.

## 2015-02-16 NOTE — Telephone Encounter (Signed)
Patient complaining of side affects since she increased her Neurontin  Call back number [phone]

## 2015-02-17 NOTE — Telephone Encounter (Signed)
Attempted to contact patient at the number she left and the mailbox is full.

## 2015-02-17 NOTE — Telephone Encounter (Signed)
Attempted to contact patient as well to find out symptoms of reaction to neurontin. Voicemail was full.

## 2015-02-17 NOTE — Telephone Encounter (Signed)
Caryl Pina, will you call her and see what SE are.  Also, give her the message that Luvenia Starch was calling her about.

## 2015-02-18 ENCOUNTER — Encounter: Payer: Self-pay | Admitting: Neurology

## 2015-02-18 ENCOUNTER — Ambulatory Visit (HOSPITAL_COMMUNITY)
Admission: RE | Admit: 2015-02-18 | Discharge: 2015-02-18 | Disposition: A | Discharge status: Home / Self Care | Payer: BC Managed Care – PPO | Source: Ambulatory Visit | Attending: Neurology | Admitting: Neurology

## 2015-02-18 DIAGNOSIS — M5022 Other cervical disc displacement, mid-cervical region: Secondary | ICD-10-CM | POA: Diagnosis not present

## 2015-02-18 DIAGNOSIS — M25569 Pain in unspecified knee: Secondary | ICD-10-CM | POA: Diagnosis not present

## 2015-02-18 DIAGNOSIS — M542 Cervicalgia: Secondary | ICD-10-CM

## 2015-02-18 DIAGNOSIS — R251 Tremor, unspecified: Secondary | ICD-10-CM | POA: Diagnosis not present

## 2015-02-18 NOTE — Telephone Encounter (Signed)
Left message for patient to call me back. 

## 2015-02-18 NOTE — Telephone Encounter (Signed)
Letter mailed to patient with results.

## 2015-02-21 ENCOUNTER — Telehealth: Payer: Self-pay | Admitting: Neurology

## 2015-02-21 NOTE — Telephone Encounter (Signed)
Mailed letter to patient with normal results due to patient not returning previous calls for EMG results. To call with any questions.

## 2015-02-21 NOTE — Telephone Encounter (Signed)
Patient did not call back with side effects of medication.  Jade mailed a letter giving patient other information.

## 2015-02-21 NOTE — Telephone Encounter (Signed)
-----   Message from La Conner, DO sent at 02/18/2015  3:49 PM EDT ----- Let pt know that her MRI of the cervical spine is unremarkable

## 2015-04-01 ENCOUNTER — Telehealth: Payer: Self-pay | Admitting: Neurology

## 2015-04-01 MED ORDER — GABAPENTIN 600 MG PO TABS: 600.0000 mg | ORAL_TABLET | Freq: Two times a day (BID) | ORAL | Status: DC

## 2015-04-01 NOTE — Telephone Encounter (Signed)
Spoke with patient. She states she is currently on Gabapentin 300 mg in the morning, 600 mg at night. She is not having side effects from medication. Previous phone call which stated she was having side effects she believes was from EMG - she had severe sciatic pain down her leg. She was placed on a course of Prednisone by her PCP and that is better. She states that Gabapentin isn't doing much for the tremor in her hands, arms, and head. It is sometimes better, but most of the time the same. She was asking about an alternative medication. Her PCP advised her to ask about Requip. If she is to remain on this dose of Gabapentin she will need a refill with the current dosage instructions called in. Please advise.

## 2015-04-01 NOTE — Telephone Encounter (Signed)
Pt called to say that the medication Gabapentin is not working for her, would like a call back at [phone] Ext:[phone]/Dawn

## 2015-04-01 NOTE — Telephone Encounter (Signed)
i'm not sure what the requip would be for.  She doesn't have PD, which is what requip helps with.  She just has essential tremor.  She can try to increase the dose of gabapentin to 600 mg bid and you can send new RX if you would like.

## 2015-04-01 NOTE — Telephone Encounter (Signed)
Notified patient of advisement. She is agreeable to try increase of Gabapentin. New Rx sent to her pharmacy.

## 2015-05-06 ENCOUNTER — Encounter: Payer: Self-pay | Admitting: Neurology

## 2015-05-06 ENCOUNTER — Ambulatory Visit (INDEPENDENT_AMBULATORY_CARE_PROVIDER_SITE_OTHER): Payer: BC Managed Care – PPO | Admitting: Neurology

## 2015-05-06 VITALS — BP 100/70 | HR 76 | Ht 68.0 in | Wt 239.0 lb

## 2015-05-06 DIAGNOSIS — G250 Essential tremor: Secondary | ICD-10-CM | POA: Diagnosis not present

## 2015-05-06 DIAGNOSIS — M797 Fibromyalgia: Secondary | ICD-10-CM | POA: Diagnosis not present

## 2015-05-06 DIAGNOSIS — R29818 Other symptoms and signs involving the nervous system: Secondary | ICD-10-CM

## 2015-05-06 DIAGNOSIS — R2689 Other abnormalities of gait and mobility: Secondary | ICD-10-CM

## 2015-05-06 MED ORDER — PRIMIDONE 50 MG PO TABS: 50.0000 mg | ORAL_TABLET | Freq: Every day | ORAL | Status: DC

## 2015-05-06 NOTE — Progress Notes (Signed)
Subjective:   Sarah Cooley was seen in consultation in the movement disorder clinic at the request of [facility], NP.  The evaluation is for tremor.  The patient is a 49 y.o. right handed female with a history of tremor.  Pt states that the tremor is in both hands and in the head.  Pt states that it started in the hands more than 10 years ago and started in the head more recently.  Friends notice the head tremor more than she does.  She states that in January cymbalta was added for fibromyalgia and IBS and fluoxetine was d/c and wellbutrin was also added.  She returned a month later and was c/o balance issues, constipation, sexual dysfunction, increased fibromyalgia pain, urinary retention.  Her meds were changed back.  The cymbalta and wellbutrin were d/c at the end of feb.  The tremor has not changed at all.  She still has concentration difficulty.  Her balance issues seem to come and go.  She still has some bladder dysfunction, but that is better.  She states that she has been on seroquel in the past and sounds like she had "uncontrollable movements."   There is a family hx of tremor in her mother.  She states that her mother has "demyelination" in her back.    Affected by caffeine:  No.  (1 large cup per day) Affected by alcohol:  No. (2-3 times per year) Affected by stress:  Yes.   Affected by fatigue:  Yes.   Spills soup if on spoon:  Yes.   Spills glass of liquid if full:  Yes.   Affects ADL's (tying shoes, brushing teeth, etc):  No. (does have some trouble with makeup)  05/06/15 update:  The patient returns today for follow-up.   She is accompanied by her husband who supplements the hx.   The patient has a history of essential tremor.  Last visit, she was on low-dose gabapentin for fibromyalgia and I was reluctant to add further medication because of elevated liver enzymes.  Therefore, I increased her gabapentin in hopes that it would help her essential tremor.  She called me on  June 10 and stated that it was not helping.  She stated that her primary care for providers told her to ask me about Requip.  I explained to her that Requip was only helpful for patients who have parkinsonian tremor, which she does not have.  I did increase her gabapentin, however, to 600 mg twice per day.  Today, the patient states that this has really helped.  It has not helped her fibromyalgia either.  Last visit, the patient was also complaining about balance difficulties.  I thought that this was likely from her clonazepam, which she was taking 3 times per day.  However, the patient wanted to explore this further.  She had an EMG on 02/14/2015.  This was normal.  Specifically, there was no evidence of radiculopathy or peripheral neuropathy.  She also had an unremarkable MRI of the cervical spine.  She had previously already had an MRI of the brain.  I explained to her that I did not feel her balance changes were a primary neurologic problem.  She continues to c/o balance issues and falling.  She states that she has had some bradycardia.  She has had a normal EKG and has been altering her dosage/timing of her atenolol because of that.  States that she has been having vision change as well.  Saw her eye doctor  and noted no problems.  Current/Previously tried tremor medications: n/a  Current medications that may exacerbate tremor:  n/a  I reviewed the patients MRI of the brain dated 01/08/15.  There were very rare T2 hyperintensities.  Pt worried that she has MS.    Outside reports reviewed: historical medical records, lab reports and x-ray reports.  Allergies  Allergen Reactions  . Erythromycin Other (See Comments)    GI- Upset   . Minocycline Hcl Hives  . Ramipril Other (See Comments)    Ramipril--Caused Pancreatitis  . Shrimp [Shellfish Allergy] Swelling  . Drixoral [Brompheniramine-Pseudoeph] Rash  . Sinutab [Chlorphen-Pseudoephed-Apap] Rash    Outpatient Encounter Prescriptions as of  05/06/2015  Medication Sig  . acetaminophen (TYLENOL) 500 MG tablet Take 500 mg by mouth as needed. For pain  . atenolol (TENORMIN) 50 MG tablet Take 25 mg by mouth daily.   Marland Kitchen atorvastatin (LIPITOR) 20 MG tablet Take 40 mg by mouth daily.   . cetirizine (ZYRTEC) 10 MG tablet Take 10 mg by mouth daily.  . clonazePAM (KLONOPIN) 1 MG tablet Take 1 mg by mouth 3 (three) times daily as needed. For anxiety  . diclofenac (VOLTAREN) 75 MG EC tablet Take 75 mg by mouth 2 (two) times daily.  Marland Kitchen FLUoxetine (PROZAC) 20 MG capsule Take 60 mg by mouth daily.   Marland Kitchen gabapentin (NEURONTIN) 600 MG tablet Take 1 tablet (600 mg total) by mouth 2 (two) times daily.  . hydrochlorothiazide (HYDRODIURIL) 25 MG tablet Take 25 mg by mouth daily.  Marland Kitchen ibuprofen (ADVIL,MOTRIN) 200 MG tablet Take 400 mg by mouth as needed. For pain  . zolpidem (AMBIEN) 10 MG tablet Take 10 mg by mouth at bedtime.   . [DISCONTINUED] ergocalciferol (VITAMIN D2) 50000 UNITS capsule Take 50,000 Units by mouth once a week.   No facility-administered encounter medications on file as of 05/06/2015.    Past Medical History  Diagnosis Date  . Fibromyalgia   . Depression   . OSA (obstructive sleep apnea)   . Hypertension   . Hyperlipidemia     Past Surgical History  Procedure Laterality Date  . Bone graft hip iliac crest    . Abdominal hysterectomy    . Bladder surgery    . Knee arthroscopy w/ acl reconstruction      right  . Cholecystectomy      History   Social History  . Marital Status: Married    Spouse Name: N/A  . Number of Children: N/A  . Years of Education: N/A   Occupational History  . physician office    Social History Main Topics  . Smoking status: Never Smoker   . Smokeless tobacco: Not on file  . Alcohol Use: 0.0 oz/week    0 Standard drinks or equivalent per week     Comment: 2-3 times a year  . Drug Use: No  . Sexual Activity: Not on file   Other Topics Concern  . Not on file   Social History Narrative      Family Status  Relation Status Death Age  . Mother Alive     tremor, DJD, lichen planus  . Father Alive     HTN, hyperlipidemia  . Sister Alive     SLE  . Brother Alive     healthy, depression  . Son Alive     1, bipolar  . Daughter Alive     1, healthy    Review of Systems    A complete 10 system ROS was  obtained and was negative apart from what is mentioned.   Objective:   VITALS:   Filed Vitals:   05/06/15 1010  BP: 100/70  Pulse: 76  Height: 5\' 8"  (1.727 m)  Weight: 239 lb (108.41 kg)   Gen:  Appears stated age and in NAD. HEENT:  Normocephalic, atraumatic. The mucous membranes are moist. The superficial temporal arteries are without ropiness or tenderness. Cardiovascular: Regular rate and rhythm. Lungs: Clear to auscultation bilaterally. Neck: There are no carotid bruits noted bilaterally.  NEUROLOGICAL:  Orientation:  The patient is alert and oriented x 3.  Cranial nerves: There is good facial symmetry.  Speech is fluent and clear. Soft palate rises symmetrically and there is no tongue deviation. Hearing is intact to conversational tone. Tone: Tone is good throughout. Sensation: Sensation is intact to light touch throughout. Coordination:  The patient has no dysdiadichokinesia or dysmetria. Motor: Strength is 5/5 in the bilateral upper and lower extremities.  Shoulder shrug is equal bilaterally.  There is no pronator drift.  There are no fasciculations noted. Gait and Station: The patient is able to ambulate without difficulty.   MOVEMENT EXAM: Tremor:  There is minimal tremor of the outstretched hands. No intention tremor.  She has no head tremor today.    She has no significant difficulty with Archimedes spirals.   Labs:  Labs were reviewed from her primary care physician office.  Her AST was 63 and ALT was 81.  Patient states that because of these elevations her cholesterol medication was changed from Pravachol to Lipitor.  Her TSH was normal at  1.590.     Assessment/Plan:   1.  Essential Tremor.  -Clinically, I saw almost no tremor today but pt didn't feel that increasing her gabapentin that she was already on for fibromyalgia was helpful for tremor.  -Wants to try primidone, 50 mg at night.  States that her PCP did repeat LFT's and were normal.  I did call PCP for labs yesterday and got labs from 03/31/15 but this only included lipids and vitamin D.  Will try to get a copy  -decrease gabapentin to 600 mg q hs 2.  Loss of balance.  -The patient asked me several questions about what could be causing this.  I reiterated that I could not find a primary neurologic etiology for balance change.  She has had an extensive neurologic work up (MRI brain, cervical spine, EMG) and there was nothing to suggest a primary neurologic etiology for balance change.  I told her that this may be medication but she needs to f/u with PCP to discuss further. 3.  Fibromyalgia  -This is long-standing and she is under the care of her primary care physician for this. 4.  She will follow-up with me in the next few months, sooner should new neurologic issues arise.

## 2015-05-06 NOTE — Patient Instructions (Addendum)
1.  Start primidone - 50 mg - 1/2 tablet at night for 4 nights and then increase to 1 tablet at night  2.  Reduce Gabapentin to 600 mg at bedtime.

## 2015-07-17 ENCOUNTER — Emergency Department (HOSPITAL_COMMUNITY): Payer: BC Managed Care – PPO

## 2015-07-17 ENCOUNTER — Encounter (HOSPITAL_COMMUNITY): Payer: Self-pay | Admitting: *Deleted

## 2015-07-17 ENCOUNTER — Observation Stay (HOSPITAL_COMMUNITY)
Admission: EM | Admit: 2015-07-17 | Discharge: 2015-07-20 | Disposition: A | Discharge status: Home / Self Care | Payer: BC Managed Care – PPO | Attending: Emergency Medicine | Admitting: Emergency Medicine

## 2015-07-17 DIAGNOSIS — M1990 Unspecified osteoarthritis, unspecified site: Secondary | ICD-10-CM | POA: Insufficient documentation

## 2015-07-17 DIAGNOSIS — K589 Irritable bowel syndrome without diarrhea: Secondary | ICD-10-CM | POA: Diagnosis not present

## 2015-07-17 DIAGNOSIS — F0390 Unspecified dementia without behavioral disturbance: Secondary | ICD-10-CM | POA: Insufficient documentation

## 2015-07-17 DIAGNOSIS — K449 Diaphragmatic hernia without obstruction or gangrene: Secondary | ICD-10-CM | POA: Diagnosis not present

## 2015-07-17 DIAGNOSIS — G20 Parkinson's disease: Secondary | ICD-10-CM | POA: Insufficient documentation

## 2015-07-17 DIAGNOSIS — E669 Obesity, unspecified: Secondary | ICD-10-CM | POA: Diagnosis present

## 2015-07-17 DIAGNOSIS — I129 Hypertensive chronic kidney disease with stage 1 through stage 4 chronic kidney disease, or unspecified chronic kidney disease: Secondary | ICD-10-CM | POA: Insufficient documentation

## 2015-07-17 DIAGNOSIS — N189 Chronic kidney disease, unspecified: Secondary | ICD-10-CM | POA: Diagnosis not present

## 2015-07-17 DIAGNOSIS — E785 Hyperlipidemia, unspecified: Secondary | ICD-10-CM | POA: Insufficient documentation

## 2015-07-17 DIAGNOSIS — I609 Nontraumatic subarachnoid hemorrhage, unspecified: Secondary | ICD-10-CM

## 2015-07-17 DIAGNOSIS — I4891 Unspecified atrial fibrillation: Secondary | ICD-10-CM | POA: Diagnosis not present

## 2015-07-17 DIAGNOSIS — G4489 Other headache syndrome: Secondary | ICD-10-CM | POA: Diagnosis not present

## 2015-07-17 DIAGNOSIS — R519 Headache, unspecified: Secondary | ICD-10-CM | POA: Diagnosis present

## 2015-07-17 DIAGNOSIS — Z79899 Other long term (current) drug therapy: Secondary | ICD-10-CM | POA: Diagnosis not present

## 2015-07-17 DIAGNOSIS — I2510 Atherosclerotic heart disease of native coronary artery without angina pectoris: Secondary | ICD-10-CM | POA: Insufficient documentation

## 2015-07-17 DIAGNOSIS — R079 Chest pain, unspecified: Secondary | ICD-10-CM | POA: Insufficient documentation

## 2015-07-17 DIAGNOSIS — I10 Essential (primary) hypertension: Secondary | ICD-10-CM | POA: Diagnosis not present

## 2015-07-17 DIAGNOSIS — G44201 Tension-type headache, unspecified, intractable: Secondary | ICD-10-CM | POA: Diagnosis not present

## 2015-07-17 DIAGNOSIS — R51 Headache: Secondary | ICD-10-CM

## 2015-07-17 DIAGNOSIS — Q2730 Arteriovenous malformation, site unspecified: Secondary | ICD-10-CM | POA: Diagnosis not present

## 2015-07-17 DIAGNOSIS — M720 Palmar fascial fibromatosis [Dupuytren]: Secondary | ICD-10-CM | POA: Diagnosis not present

## 2015-07-17 DIAGNOSIS — R778 Other specified abnormalities of plasma proteins: Secondary | ICD-10-CM | POA: Diagnosis present

## 2015-07-17 DIAGNOSIS — I472 Ventricular tachycardia: Secondary | ICD-10-CM | POA: Insufficient documentation

## 2015-07-17 DIAGNOSIS — R42 Dizziness and giddiness: Secondary | ICD-10-CM | POA: Diagnosis present

## 2015-07-17 DIAGNOSIS — R7989 Other specified abnormal findings of blood chemistry: Secondary | ICD-10-CM | POA: Diagnosis not present

## 2015-07-17 DIAGNOSIS — K219 Gastro-esophageal reflux disease without esophagitis: Secondary | ICD-10-CM | POA: Insufficient documentation

## 2015-07-17 DIAGNOSIS — Z8701 Personal history of pneumonia (recurrent): Secondary | ICD-10-CM | POA: Insufficient documentation

## 2015-07-17 DIAGNOSIS — I5032 Chronic diastolic (congestive) heart failure: Secondary | ICD-10-CM | POA: Insufficient documentation

## 2015-07-17 DIAGNOSIS — Z7982 Long term (current) use of aspirin: Secondary | ICD-10-CM | POA: Insufficient documentation

## 2015-07-17 LAB — CBC WITH DIFFERENTIAL/PLATELET
BASOS ABS: 0 10*3/uL (ref 0.0–0.1)
BASOS PCT: 0 %
EOS ABS: 0.1 10*3/uL (ref 0.0–0.7)
EOS PCT: 1 %
HEMATOCRIT: 41.5 % (ref 36.0–46.0)
Hemoglobin: 14.5 g/dL (ref 12.0–15.0)
Lymphocytes Relative: 22 %
Lymphs Abs: 1.9 10*3/uL (ref 0.7–4.0)
MCH: 32.4 pg (ref 26.0–34.0)
MCHC: 34.9 g/dL (ref 30.0–36.0)
MCV: 92.6 fL (ref 78.0–100.0)
MONO ABS: 0.5 10*3/uL (ref 0.1–1.0)
MONOS PCT: 6 %
NEUTROS ABS: 6.2 10*3/uL (ref 1.7–7.7)
Neutrophils Relative %: 71 %
PLATELETS: 201 10*3/uL (ref 150–400)
RBC: 4.48 MIL/uL (ref 3.87–5.11)
RDW: 12.8 % (ref 11.5–15.5)
WBC: 8.7 10*3/uL (ref 4.0–10.5)

## 2015-07-17 LAB — CBC
HCT: 40.8 % (ref 36.0–46.0)
Hemoglobin: 14.1 g/dL (ref 12.0–15.0)
MCH: 31.9 pg (ref 26.0–34.0)
MCHC: 34.6 g/dL (ref 30.0–36.0)
MCV: 92.3 fL (ref 78.0–100.0)
PLATELETS: 209 10*3/uL (ref 150–400)
RBC: 4.42 MIL/uL (ref 3.87–5.11)
RDW: 12.8 % (ref 11.5–15.5)
WBC: 12.1 10*3/uL — ABNORMAL HIGH (ref 4.0–10.5)

## 2015-07-17 LAB — CREATININE, SERUM
Creatinine, Ser: 0.72 mg/dL (ref 0.44–1.00)
GFR calc Af Amer: >60 mL/min (ref >60)
GFR calc non Af Amer: >60 mL/min (ref >60)

## 2015-07-17 LAB — BASIC METABOLIC PANEL
ANION GAP: 10 (ref 5–15)
BUN: 13 mg/dL (ref 6–20)
CALCIUM: 9 mg/dL (ref 8.9–10.3)
CO2: 26 mmol/L (ref 22–32)
CREATININE: 0.71 mg/dL (ref 0.44–1.00)
Chloride: 101 mmol/L (ref 101–111)
GFR calc Af Amer: >60 mL/min (ref >60)
GLUCOSE: 98 mg/dL (ref 65–99)
Potassium: 3.3 mmol/L — ABNORMAL LOW (ref 3.5–5.1)
Sodium: 137 mmol/L (ref 135–145)

## 2015-07-17 LAB — TSH: TSH: 3.845 u[IU]/mL (ref 0.350–4.500)

## 2015-07-17 LAB — GLUCOSE, CAPILLARY: Glucose-Capillary: 104 mg/dL — ABNORMAL HIGH (ref 65–99)

## 2015-07-17 LAB — TROPONIN I
TROPONIN I: 0.15 ng/mL — AB (ref <0.031)
TROPONIN I: 0.19 ng/mL — AB (ref <0.031)

## 2015-07-17 MED ORDER — DICLOFENAC SODIUM 75 MG PO TBEC
75.0000 mg | DELAYED_RELEASE_TABLET | Freq: Two times a day (BID) | ORAL | Status: DC | PRN
  Administered 2015-07-18: 75 mg via ORAL
  Filled 2015-07-17 (×2): qty 1

## 2015-07-17 MED ORDER — INFLUENZA VAC SPLIT QUAD 0.5 ML IM SUSY
0.5000 mL | PREFILLED_SYRINGE | INTRAMUSCULAR | Status: AC
  Administered 2015-07-18: 0.5 mL via INTRAMUSCULAR
  Filled 2015-07-17: qty 0.5

## 2015-07-17 MED ORDER — INSULIN ASPART 100 UNIT/ML ~~LOC~~ SOLN: 0.0000 [IU] | Freq: Every day | SUBCUTANEOUS | Status: DC

## 2015-07-17 MED ORDER — ONDANSETRON HCL 4 MG/2ML IJ SOLN: 4.0000 mg | Freq: Four times a day (QID) | INTRAMUSCULAR | Status: DC | PRN

## 2015-07-17 MED ORDER — DOCUSATE SODIUM 100 MG PO CAPS: 100.0000 mg | ORAL_CAPSULE | Freq: Every day | ORAL | Status: DC | PRN

## 2015-07-17 MED ORDER — CO Q 10 10 MG PO CAPS: 1.0000 | ORAL_CAPSULE | Freq: Every day | ORAL | Status: DC

## 2015-07-17 MED ORDER — LORATADINE 10 MG PO TABS: 10.0000 mg | ORAL_TABLET | Freq: Every day | ORAL | Status: DC

## 2015-07-17 MED ORDER — INSULIN ASPART 100 UNIT/ML ~~LOC~~ SOLN: 0.0000 [IU] | Freq: Three times a day (TID) | SUBCUTANEOUS | Status: DC

## 2015-07-17 MED ORDER — ENOXAPARIN SODIUM 40 MG/0.4ML ~~LOC~~ SOLN
40.0000 mg | SUBCUTANEOUS | Status: DC
  Administered 2015-07-17: 40 mg via SUBCUTANEOUS
  Filled 2015-07-17: qty 0.4

## 2015-07-17 MED ORDER — MORPHINE SULFATE (PF) 4 MG/ML IV SOLN
4.0000 mg | Freq: Once | INTRAVENOUS | Status: AC
  Administered 2015-07-17: 4 mg via INTRAVENOUS
  Filled 2015-07-17: qty 1

## 2015-07-17 MED ORDER — CLONIDINE HCL 0.1 MG PO TABS
0.1000 mg | ORAL_TABLET | Freq: Once | ORAL | Status: DC
  Filled 2015-07-17: qty 1

## 2015-07-17 MED ORDER — PRIMIDONE 50 MG PO TABS
50.0000 mg | ORAL_TABLET | Freq: Every day | ORAL | Status: DC
  Administered 2015-07-17 – 2015-07-19 (×3): 50 mg via ORAL
  Filled 2015-07-17 (×5): qty 1

## 2015-07-17 MED ORDER — FLUOXETINE HCL 20 MG PO CAPS: 60.0000 mg | ORAL_CAPSULE | Freq: Every day | ORAL | Status: DC

## 2015-07-17 MED ORDER — HYDROCHLOROTHIAZIDE 25 MG PO TABS
25.0000 mg | ORAL_TABLET | Freq: Every day | ORAL | Status: DC
Start: 2015-07-18 — End: 2015-07-20
  Administered 2015-07-18 – 2015-07-20 (×3): 25 mg via ORAL
  Filled 2015-07-17 (×3): qty 1

## 2015-07-17 MED ORDER — ONDANSETRON HCL 4 MG/2ML IJ SOLN
4.0000 mg | Freq: Once | INTRAMUSCULAR | Status: AC
  Administered 2015-07-17: 4 mg via INTRAVENOUS
  Filled 2015-07-17: qty 2

## 2015-07-17 MED ORDER — SODIUM CHLORIDE 0.9 % IJ SOLN
3.0000 mL | Freq: Two times a day (BID) | INTRAMUSCULAR | Status: DC
  Administered 2015-07-17 – 2015-07-20 (×4): 3 mL via INTRAVENOUS

## 2015-07-17 MED ORDER — SODIUM CHLORIDE 0.9 % IJ SOLN
3.0000 mL | Freq: Two times a day (BID) | INTRAMUSCULAR | Status: DC
  Administered 2015-07-17 – 2015-07-19 (×3): 3 mL via INTRAVENOUS

## 2015-07-17 MED ORDER — ATORVASTATIN CALCIUM 40 MG PO TABS
40.0000 mg | ORAL_TABLET | Freq: Every day | ORAL | Status: DC
  Administered 2015-07-17 – 2015-07-19 (×3): 40 mg via ORAL
  Filled 2015-07-17 (×3): qty 1

## 2015-07-17 MED ORDER — HYDROCODONE-ACETAMINOPHEN 5-325 MG PO TABS
0.5000 | ORAL_TABLET | Freq: Four times a day (QID) | ORAL | Status: DC | PRN
  Administered 2015-07-18: 1 via ORAL
  Filled 2015-07-17: qty 1

## 2015-07-17 MED ORDER — GABAPENTIN 300 MG PO CAPS
ORAL_CAPSULE | ORAL | Status: AC
  Filled 2015-07-17: qty 2

## 2015-07-17 MED ORDER — LOSARTAN POTASSIUM 50 MG PO TABS
100.0000 mg | ORAL_TABLET | Freq: Every day | ORAL | Status: DC
  Administered 2015-07-18 – 2015-07-20 (×3): 100 mg via ORAL
  Filled 2015-07-17 (×3): qty 2

## 2015-07-17 MED ORDER — VITAMIN E 180 MG (400 UNIT) PO CAPS
400.0000 [IU] | ORAL_CAPSULE | Freq: Every day | ORAL | Status: DC
  Administered 2015-07-18 – 2015-07-20 (×3): 400 [IU] via ORAL
  Filled 2015-07-17 (×5): qty 1

## 2015-07-17 MED ORDER — GABAPENTIN 600 MG PO TABS
600.0000 mg | ORAL_TABLET | Freq: Every day | ORAL | Status: DC
  Administered 2015-07-17: 600 mg via ORAL
  Filled 2015-07-17: qty 1

## 2015-07-17 MED ORDER — METFORMIN HCL 500 MG PO TABS
500.0000 mg | ORAL_TABLET | Freq: Two times a day (BID) | ORAL | Status: DC
  Administered 2015-07-18 – 2015-07-20 (×5): 500 mg via ORAL
  Filled 2015-07-17 (×5): qty 1

## 2015-07-17 MED ORDER — ONDANSETRON HCL 4 MG PO TABS: 4.0000 mg | ORAL_TABLET | Freq: Four times a day (QID) | ORAL | Status: DC | PRN

## 2015-07-17 MED ORDER — LORATADINE 10 MG PO TABS: 10.0000 mg | ORAL_TABLET | Freq: Every day | ORAL | Status: DC | PRN

## 2015-07-17 MED ORDER — SODIUM CHLORIDE 0.9 % IV BOLUS (SEPSIS)
500.0000 mL | Freq: Once | INTRAVENOUS | Status: AC
  Administered 2015-07-17: 500 mL via INTRAVENOUS

## 2015-07-17 MED ORDER — CLONAZEPAM 0.5 MG PO TABS: 1.0000 mg | ORAL_TABLET | Freq: Three times a day (TID) | ORAL | Status: DC | PRN

## 2015-07-17 MED ORDER — PNEUMOCOCCAL VAC POLYVALENT 25 MCG/0.5ML IJ INJ
0.5000 mL | INJECTION | INTRAMUSCULAR | Status: AC
  Administered 2015-07-18: 0.5 mL via INTRAMUSCULAR
  Filled 2015-07-17: qty 0.5

## 2015-07-17 MED ORDER — HYDROCHLOROTHIAZIDE 25 MG PO TABS: 25.0000 mg | ORAL_TABLET | Freq: Every day | ORAL | Status: DC

## 2015-07-17 MED ORDER — FLUOXETINE HCL 20 MG PO CAPS
60.0000 mg | ORAL_CAPSULE | Freq: Every day | ORAL | Status: DC
Start: 2015-07-18 — End: 2015-07-20
  Administered 2015-07-18 – 2015-07-20 (×3): 60 mg via ORAL
  Filled 2015-07-17 (×3): qty 3

## 2015-07-17 MED ORDER — ATENOLOL 25 MG PO TABS
25.0000 mg | ORAL_TABLET | Freq: Every day | ORAL | Status: DC
  Administered 2015-07-17: 25 mg via ORAL
  Filled 2015-07-17 (×2): qty 1

## 2015-07-17 MED ORDER — PRIMIDONE 50 MG PO TABS
ORAL_TABLET | ORAL | Status: AC
  Filled 2015-07-17: qty 1

## 2015-07-17 MED ORDER — TRIAMCINOLONE ACETONIDE 55 MCG/ACT NA AERO
2.0000 | INHALATION_SPRAY | Freq: Every day | NASAL | Status: DC | PRN
  Filled 2015-07-17: qty 21.6

## 2015-07-17 NOTE — Progress Notes (Signed)
PHARMACIST - PHYSICIAN ORDER COMMUNICATION  CONCERNING: P&T Medication Policy on Herbal Medications  DESCRIPTION:  This patient's order for:  Co Q10  has been noted.  This product(s) is classified as an "herbal" or natural product. Due to a lack of definitive safety studies or FDA approval, nonstandard manufacturing practices, plus the potential risk of unknown drug-drug interactions while on inpatient medications, the Pharmacy and Therapeutics Committee does not permit the use of "herbal" or natural products of this type within [facility].   ACTION TAKEN: The pharmacy department is unable to verify this order at this time and your patient has been informed of this safety policy. Please reevaluate patient's clinical condition at discharge and address if the herbal or natural product(s) should be resumed at that time.  Jerrye Beavers, PharmD, BCPS Clinical Pharmacist

## 2015-07-17 NOTE — H&P (Signed)
[facility] History and Physical  Zienna Ahlin FAO:[id number] DOB: 1966-10-05 DOA: 07/17/2015  Referring physician: ER, Dr. Lacinda Axon PCP: Delia Chimes, NP   Chief Complaint: Severe headache. Chest numbness.  HPI: Sarah Cooley is a 49 y.o. female  This is a 49 year old lady who had a sudden onset of severe headache earlier this evening that was occipital and was associated with weakness but no photophobia. There was no nausea. She also described chest numbness with this headache. She does not admit to any chest pain when I saw her although earlier apparently she had said this. She denies any palpitations or dyspnea. She has hypertension, hyperlipidemia, obstructive sleep apnea, diagnosis of fibromyalgia. Evaluation in the emergency room shows a normal CT brain scan but elevated troponin levels. She is now being admitted for further investigation and management. There is a family history of lupus.   Review of Systems:  Apart from symptoms above, all systems are negative.  Past Medical History  Diagnosis Date  . Fibromyalgia   . Depression   . OSA (obstructive sleep apnea)   . Hypertension   . Hyperlipidemia    Past Surgical History  Procedure Laterality Date  . Bone graft hip iliac crest    . Abdominal hysterectomy    . Bladder surgery    . Knee arthroscopy w/ acl reconstruction      right  . Cholecystectomy     Social History:  reports that she has never smoked. She does not have any smokeless tobacco history on file. She reports that she drinks alcohol. She reports that she does not use illicit drugs.  Allergies  Allergen Reactions  . Shrimp [Shellfish Allergy] Anaphylaxis and Swelling  . Erythromycin Other (See Comments)    GI- Upset   . Minocycline Hcl Hives  . Ramipril Other (See Comments)    Ramipril--Caused Pancreatitis  . Drixoral [Brompheniramine-Pseudoeph] Rash  . Sinutab [Chlorphen-Pseudoephed-Apap] Rash     Family history: There is a  family history of lupus.  Prior to Admission medications   Medication Sig Start Date End Date Taking? Authorizing Provider  atorvastatin (LIPITOR) 40 MG tablet Take 40 mg by mouth daily.   Yes Historical Provider, MD  clonazePAM (KLONOPIN) 1 MG tablet Take 1 mg by mouth 3 (three) times daily as needed. For anxiety   Yes Historical Provider, MD  Coenzyme Q10 (CO Q 10) 10 MG CAPS Take 1 capsule by mouth daily.   Yes Historical Provider, MD  diclofenac (VOLTAREN) 75 MG EC tablet Take 75 mg by mouth 2 (two) times daily as needed for mild pain.    Yes Historical Provider, MD  docusate sodium (COLACE) 100 MG capsule Take 100 mg by mouth daily as needed for mild constipation.   Yes Historical Provider, MD  FLUoxetine (PROZAC) 20 MG capsule Take 60 mg by mouth daily.    Yes Historical Provider, MD  gabapentin (NEURONTIN) 600 MG tablet Take 1 tablet (600 mg total) by mouth 2 (two) times daily. Patient taking differently: Take 600 mg by mouth at bedtime.  04/01/15  Yes Rebecca S Tat, DO  hydrochlorothiazide (HYDRODIURIL) 25 MG tablet Take 25 mg by mouth daily.   Yes Historical Provider, MD  HYDROcodone-acetaminophen (NORCO/VICODIN) 5-325 MG per tablet Take 0.5-1 tablets by mouth every 6 (six) hours as needed for moderate pain.  05/10/15  Yes Historical Provider, MD  ibuprofen (ADVIL,MOTRIN) 200 MG tablet Take 800 mg by mouth every 6 (six) hours as needed for mild pain.   Yes Historical Provider, MD  losartan (COZAAR) 100 MG tablet Take 100 mg by mouth daily.   Yes Historical Provider, MD  metFORMIN (GLUCOPHAGE) 500 MG tablet Take 500 mg by mouth 2 (two) times daily with a meal.   Yes Historical Provider, MD  primidone (MYSOLINE) 50 MG tablet Take 1 tablet (50 mg total) by mouth at bedtime. 05/06/15  Yes Rebecca S Tat, DO  triamcinolone (NASACORT ALLERGY 24HR) 55 MCG/ACT AERO nasal inhaler Place 2 sprays into the nose daily as needed (Allergies).   Yes Historical Provider, MD  vitamin E 400 UNIT capsule Take  400 Units by mouth daily.   Yes Historical Provider, MD  atenolol (TENORMIN) 50 MG tablet Take 25 mg by mouth daily.     Historical Provider, MD  cetirizine (ZYRTEC) 10 MG tablet Take 10 mg by mouth daily as needed for allergies.     Historical Provider, MD   Physical Exam: Filed Vitals:   07/17/15 2000 07/17/15 2015 07/17/15 2030 07/17/15 2045  BP: 123/78  141/100   Pulse: 82 84 93 108  Temp:      TempSrc:      Resp: 10 12 14 9   Height:      Weight:      SpO2: 94% 95% 95% 98%    Wt Readings from Last 3 Encounters:  07/17/15 103.42 kg (228 lb)  05/06/15 108.41 kg (239 lb)  02/01/15 107.502 kg (237 lb)    General:  Appears calm and comfortable. Obese. Eyes: PERRL, normal lids, irises & conjunctiva ENT: grossly normal hearing, lips & tongue Neck: no LAD, masses or thyromegaly Cardiovascular: RRR, no m/r/g. No LE edema. Telemetry: SR, no arrhythmias  Respiratory: CTA bilaterally, no w/r/r. Normal respiratory effort. Abdomen: soft, ntnd Skin: no rash or induration seen on limited exam Musculoskeletal: grossly normal tone BUE/BLE Psychiatric: grossly normal mood and affect, speech fluent and appropriate Neurologic: grossly non-focal.          Labs on Admission:  Basic Metabolic Panel:  Recent Labs Lab 07/17/15 1555  NA 137  K 3.3*  CL 101  CO2 26  GLUCOSE 98  BUN 13  CREATININE 0.71  CALCIUM 9.0   Liver Function Tests: No results for input(s): AST, ALT, ALKPHOS, BILITOT, PROT, ALBUMIN in the last 168 hours. No results for input(s): LIPASE, AMYLASE in the last 168 hours. No results for input(s): AMMONIA in the last 168 hours. CBC:  Recent Labs Lab 07/17/15 1555  WBC 8.7  NEUTROABS 6.2  HGB 14.5  HCT 41.5  MCV 92.6  PLT 201   Cardiac Enzymes:  Recent Labs Lab 07/17/15 1839  TROPONINI 0.15*    BNP (last 3 results) No results for input(s): BNP in the last 8760 hours.  ProBNP (last 3 results) No results for input(s): PROBNP in the last 8760  hours.  CBG: No results for input(s): GLUCAP in the last 168 hours.  Radiological Exams on Admission: Ct Head Wo Contrast  07/17/2015   CLINICAL DATA:  49 year old female with sudden onset of stabbing pain in the back of the head 30 minutes prior to admission.  EXAM: CT HEAD WITHOUT CONTRAST  TECHNIQUE: Contiguous axial images were obtained from the base of the skull through the vertex without intravenous contrast.  COMPARISON:  No priors.  FINDINGS: No acute intracranial abnormalities. Specifically, no evidence of acute intracranial hemorrhage, no definite findings of acute/subacute cerebral ischemia, no mass, mass effect, hydrocephalus or abnormal intra or extra-axial fluid collections. Visualized paranasal sinuses and mastoids are well pneumatized. No acute displaced  skull fractures are identified.  IMPRESSION: 1. No acute intracranial abnormalities. 2. The appearance of the brain is normal.   Electronically Signed   By: Vinnie Langton M.D.   On: 07/17/2015 16:56    EKG: Independently reviewed. Sinus rhythm without any acute ST-T wave changes.  Assessment/Plan   1. Occipital headache. The etiology is not clear. Headache seems to have improved somewhat. Brain CT was unremarkable. She has been investigated with MRI brain scan for tremor. Check double-stranded DNA. Neurology consultation. 2. Elevated troponin level. She did not describe to me any chest pain or pressure but the level is elevated. She does have risk factors for coronary artery disease. I will ask cardiology to see the patient in consultation. 3. Hypertension. Now stabilized. Continue with home medications. 4. Possible diabetes. The patient has been put on metformin for she describes as elevated sugars. She has not been told that she has diabetes. Continue with metformin and can't modify diet and sliding scale of insulin. 5. Obesity. This is at the center of the majority of her medical problems.  Further recommendations will  depend on patient's hospital progress.   Code Status: Full code.  DVT Prophylaxis: Lovenox.  Family Communication: I discussed the plan with the patient at the bedside.  Disposition Plan: Home when medically stable.   Time spent: 60 minutes.  Doree Albee [facility] Pager [phone].

## 2015-07-17 NOTE — ED Notes (Addendum)
Pt states she became upset earlier this morning and has been under a lot of stress. Pt states she was eating lunch with family 30 min PTA, states stabbing pain to the back of the head. Pain continues. Pt states chest feels numb and she is nauseated. Pain to the head was noticed this morning.

## 2015-07-17 NOTE — ED Notes (Signed)
Pt appears slightly diaphoretic, c/o generalized weakness. Pt states headache is still present and worsening at this time. MD notified, states he is going to see pt at bedside.

## 2015-07-17 NOTE — ED Provider Notes (Signed)
CSN: [id number]     Arrival date & time 07/17/15  1427 History   First MD Initiated Contact with Patient 07/17/15 1503     Chief Complaint  Patient presents with  . Headache     (Consider location/radiation/quality/duration/timing/severity/associated sxs/prior Treatment) HPI.... Patient presents with occipital headache, weakness, chest pain just prior to admission. Past medical history includes hypertension, hyperlipidemia, depression, fibromyalgia.  No prior history of coronary artery disease. Nonsmoker. She is currently taking hydrochlorothiazide 25 mg and losartan 100 mg for her blood pressure. Nothing makes symptoms better or worse. Severity is moderate.  Past Medical History  Diagnosis Date  . Fibromyalgia   . Depression   . OSA (obstructive sleep apnea)   . Hypertension   . Hyperlipidemia    Past Surgical History  Procedure Laterality Date  . Bone graft hip iliac crest    . Abdominal hysterectomy    . Bladder surgery    . Knee arthroscopy w/ acl reconstruction      right  . Cholecystectomy     No family history on file. Social History  Substance Use Topics  . Smoking status: Never Smoker   . Smokeless tobacco: None  . Alcohol Use: 0.0 oz/week    0 Standard drinks or equivalent per week     Comment: 2-3 times a year   OB History    No data available     Review of Systems  All other systems reviewed and are negative.     Allergies  Shrimp; Erythromycin; Minocycline hcl; Ramipril; Drixoral; and Sinutab  Home Medications   Prior to Admission medications   Medication Sig Start Date End Date Taking? Authorizing Provider  atorvastatin (LIPITOR) 40 MG tablet Take 40 mg by mouth daily.   Yes Historical Provider, MD  clonazePAM (KLONOPIN) 1 MG tablet Take 1 mg by mouth 3 (three) times daily as needed. For anxiety   Yes Historical Provider, MD  Coenzyme Q10 (CO Q 10) 10 MG CAPS Take 1 capsule by mouth daily.   Yes Historical Provider, MD  diclofenac (VOLTAREN) 75  MG EC tablet Take 75 mg by mouth 2 (two) times daily as needed for mild pain.    Yes Historical Provider, MD  docusate sodium (COLACE) 100 MG capsule Take 100 mg by mouth daily as needed for mild constipation.   Yes Historical Provider, MD  FLUoxetine (PROZAC) 20 MG capsule Take 60 mg by mouth daily.    Yes Historical Provider, MD  gabapentin (NEURONTIN) 600 MG tablet Take 1 tablet (600 mg total) by mouth 2 (two) times daily. Patient taking differently: Take 600 mg by mouth at bedtime.  04/01/15  Yes Rebecca S Tat, DO  hydrochlorothiazide (HYDRODIURIL) 25 MG tablet Take 25 mg by mouth daily.   Yes Historical Provider, MD  HYDROcodone-acetaminophen (NORCO/VICODIN) 5-325 MG per tablet Take 0.5-1 tablets by mouth every 6 (six) hours as needed for moderate pain.  05/10/15  Yes Historical Provider, MD  ibuprofen (ADVIL,MOTRIN) 200 MG tablet Take 800 mg by mouth every 6 (six) hours as needed for mild pain.   Yes Historical Provider, MD  losartan (COZAAR) 100 MG tablet Take 100 mg by mouth daily.   Yes Historical Provider, MD  metFORMIN (GLUCOPHAGE) 500 MG tablet Take 500 mg by mouth 2 (two) times daily with a meal.   Yes Historical Provider, MD  primidone (MYSOLINE) 50 MG tablet Take 1 tablet (50 mg total) by mouth at bedtime. 05/06/15  Yes Rebecca S Tat, DO  triamcinolone (NASACORT ALLERGY 24HR)  55 MCG/ACT AERO nasal inhaler Place 2 sprays into the nose daily as needed (Allergies).   Yes Historical Provider, MD  vitamin E 400 UNIT capsule Take 400 Units by mouth daily.   Yes Historical Provider, MD  atenolol (TENORMIN) 50 MG tablet Take 25 mg by mouth daily.     Historical Provider, MD  cetirizine (ZYRTEC) 10 MG tablet Take 10 mg by mouth daily as needed for allergies.     Historical Provider, MD   BP 136/88 mmHg  Pulse 85  Temp(Src) 98 F (36.7 C) (Oral)  Resp 13  Ht 5\' 8"  (1.727 m)  Wt 228 lb (103.42 kg)  BMI 34.68 kg/m2  SpO2 92% Physical Exam  Constitutional: She is oriented to person, place,  and time.  Obese.  No neurological deficits.  HENT:  Head: Normocephalic and atraumatic.  Eyes: Conjunctivae and EOM are normal. Pupils are equal, round, and reactive to light.  Neck: Normal range of motion. Neck supple.  Cardiovascular: Normal rate and regular rhythm.   Pulmonary/Chest: Effort normal and breath sounds normal.  Abdominal: Soft. Bowel sounds are normal.  Musculoskeletal: Normal range of motion.  Neurological: She is alert and oriented to person, place, and time.  Skin: Skin is warm and dry.  Psychiatric: She has a normal mood and affect. Her behavior is normal.  Nursing note and vitals reviewed.   ED Course  Procedures (including critical care time) Labs Review Labs Reviewed  BASIC METABOLIC PANEL - Abnormal; Notable for the following:    Potassium 3.3 (*)    All other components within normal limits  TROPONIN I - Abnormal; Notable for the following:    Troponin I 0.15 (*)    All other components within normal limits  CBC WITH DIFFERENTIAL/PLATELET    Imaging Review Ct Head Wo Contrast  07/17/2015   CLINICAL DATA:  49 year old female with sudden onset of stabbing pain in the back of the head 30 minutes prior to admission.  EXAM: CT HEAD WITHOUT CONTRAST  TECHNIQUE: Contiguous axial images were obtained from the base of the skull through the vertex without intravenous contrast.  COMPARISON:  No priors.  FINDINGS: No acute intracranial abnormalities. Specifically, no evidence of acute intracranial hemorrhage, no definite findings of acute/subacute cerebral ischemia, no mass, mass effect, hydrocephalus or abnormal intra or extra-axial fluid collections. Visualized paranasal sinuses and mastoids are well pneumatized. No acute displaced skull fractures are identified.  IMPRESSION: 1. No acute intracranial abnormalities. 2. The appearance of the brain is normal.   Electronically Signed   By: Vinnie Langton M.D.   On: 07/17/2015 16:56   I have personally reviewed and  evaluated these images and lab results as part of my medical decision-making.   EKG Interpretation   Date/Time:  Sunday July 17 2015 19:00:21 EDT Ventricular Rate:  91 PR Interval:  153 QRS Duration: 102 QT Interval:  393 QTC Calculation: 483 R Axis:   2 Text Interpretation:  Sinus rhythm Low voltage, precordial leads RSR' in  V1 or V2, right VCD or RVH Borderline T abnormalities, anterior leads  Confirmed by Francois Elk  MD, Filmore Molyneux ([id number]) on 07/17/2015 7:34:03 PM      MDM   Final diagnoses:  Headache, unspecified headache type  Chest pain, unspecified chest pain type  Elevated troponin    No frank neuro deficits. Blood pressure initially elevated, but it has reduced with pain management.  CT head negative. EKG shows no acute changes. First troponin 0.15    Will discuss with general  medicine and admit    Nat Christen, MD 07/17/15 [phone]

## 2015-07-18 ENCOUNTER — Encounter (HOSPITAL_COMMUNITY): Payer: BC Managed Care – PPO

## 2015-07-18 ENCOUNTER — Encounter (HOSPITAL_COMMUNITY): Payer: Self-pay | Admitting: Adult Health

## 2015-07-18 ENCOUNTER — Inpatient Hospital Stay (HOSPITAL_COMMUNITY): Payer: BC Managed Care – PPO

## 2015-07-18 ENCOUNTER — Ambulatory Visit (HOSPITAL_COMMUNITY): Payer: BC Managed Care – PPO

## 2015-07-18 DIAGNOSIS — R51 Headache: Secondary | ICD-10-CM | POA: Diagnosis not present

## 2015-07-18 DIAGNOSIS — R7989 Other specified abnormal findings of blood chemistry: Secondary | ICD-10-CM | POA: Diagnosis not present

## 2015-07-18 DIAGNOSIS — E785 Hyperlipidemia, unspecified: Secondary | ICD-10-CM | POA: Diagnosis not present

## 2015-07-18 DIAGNOSIS — G4489 Other headache syndrome: Secondary | ICD-10-CM | POA: Diagnosis not present

## 2015-07-18 DIAGNOSIS — R079 Chest pain, unspecified: Secondary | ICD-10-CM

## 2015-07-18 DIAGNOSIS — N189 Chronic kidney disease, unspecified: Secondary | ICD-10-CM | POA: Diagnosis not present

## 2015-07-18 DIAGNOSIS — I10 Essential (primary) hypertension: Secondary | ICD-10-CM | POA: Diagnosis not present

## 2015-07-18 DIAGNOSIS — I129 Hypertensive chronic kidney disease with stage 1 through stage 4 chronic kidney disease, or unspecified chronic kidney disease: Secondary | ICD-10-CM | POA: Diagnosis not present

## 2015-07-18 DIAGNOSIS — E669 Obesity, unspecified: Secondary | ICD-10-CM | POA: Diagnosis not present

## 2015-07-18 LAB — TSH: TSH: 1.726 u[IU]/mL (ref 0.350–4.500)

## 2015-07-18 LAB — RAPID URINE DRUG SCREEN, HOSP PERFORMED
AMPHETAMINES: NOT DETECTED
Barbiturates: NOT DETECTED
Benzodiazepines: NOT DETECTED
COCAINE: NOT DETECTED
Opiates: POSITIVE — AB
TETRAHYDROCANNABINOL: NOT DETECTED

## 2015-07-18 LAB — GLUCOSE, CAPILLARY
GLUCOSE-CAPILLARY: 86 mg/dL (ref 65–99)
Glucose-Capillary: 105 mg/dL — ABNORMAL HIGH (ref 65–99)
Glucose-Capillary: 91 mg/dL (ref 65–99)
Glucose-Capillary: 88 mg/dL (ref 65–99)

## 2015-07-18 LAB — COMPREHENSIVE METABOLIC PANEL
ALK PHOS: 54 U/L (ref 38–126)
ALT: 36 U/L (ref 14–54)
AST: 29 U/L (ref 15–41)
Albumin: 3.7 g/dL (ref 3.5–5.0)
Anion gap: 10 (ref 5–15)
BUN: 12 mg/dL (ref 6–20)
CALCIUM: 8.3 mg/dL — AB (ref 8.9–10.3)
CHLORIDE: 102 mmol/L (ref 101–111)
CO2: 26 mmol/L (ref 22–32)
CREATININE: 0.79 mg/dL (ref 0.44–1.00)
GFR calc Af Amer: >60 mL/min (ref >60)
Glucose, Bld: 93 mg/dL (ref 65–99)
Potassium: 3.4 mmol/L — ABNORMAL LOW (ref 3.5–5.1)
Sodium: 138 mmol/L (ref 135–145)
Total Bilirubin: 0.7 mg/dL (ref 0.3–1.2)
Total Protein: 6.9 g/dL (ref 6.5–8.1)

## 2015-07-18 LAB — CBC
HCT: 39.4 % (ref 36.0–46.0)
Hemoglobin: 13.4 g/dL (ref 12.0–15.0)
MCH: 32 pg (ref 26.0–34.0)
MCHC: 34 g/dL (ref 30.0–36.0)
MCV: 94 fL (ref 78.0–100.0)
Platelets: 200 10*3/uL (ref 150–400)
RBC: 4.19 MIL/uL (ref 3.87–5.11)
RDW: 13 % (ref 11.5–15.5)
WBC: 11.4 10*3/uL — ABNORMAL HIGH (ref 4.0–10.5)

## 2015-07-18 LAB — TROPONIN I
TROPONIN I: 0.1 ng/mL — AB (ref <0.031)
Troponin I: 0.06 ng/mL — ABNORMAL HIGH (ref <0.031)

## 2015-07-18 LAB — MAGNESIUM: MAGNESIUM: 2 mg/dL (ref 1.7–2.4)

## 2015-07-18 MED ORDER — FLUTICASONE PROPIONATE 50 MCG/ACT NA SUSP: 2.0000 | Freq: Every day | NASAL | Status: DC | PRN

## 2015-07-18 MED ORDER — GABAPENTIN 300 MG PO CAPS
600.0000 mg | ORAL_CAPSULE | Freq: Every day | ORAL | Status: DC
  Administered 2015-07-18 – 2015-07-19 (×2): 600 mg via ORAL
  Filled 2015-07-18 (×2): qty 2

## 2015-07-18 MED ORDER — SODIUM CHLORIDE 0.9 % IJ SOLN
INTRAMUSCULAR | Status: AC
  Filled 2015-07-18: qty 42

## 2015-07-18 MED ORDER — ASPIRIN EC 81 MG PO TBEC
81.0000 mg | DELAYED_RELEASE_TABLET | Freq: Every day | ORAL | Status: DC
  Administered 2015-07-18 – 2015-07-20 (×3): 81 mg via ORAL
  Filled 2015-07-18 (×3): qty 1

## 2015-07-18 MED ORDER — REGADENOSON 0.4 MG/5ML IV SOLN
0.4000 mg | Freq: Once | INTRAVENOUS | Status: AC
  Administered 2015-07-19: 0.4 mg via INTRAVENOUS
  Filled 2015-07-18: qty 5

## 2015-07-18 NOTE — Care Management Note (Signed)
Case Management Note  Patient Details  Name: Sarah Cooley MRN: [id number] Date of Birth: 1966/05/12  Expected Discharge Date:    07/19/2015              Expected Discharge Plan:  Home/Self Care  In-House Referral:  NA  Discharge planning Services  CM Consult  Post Acute Care Choice:  NA Choice offered to:  NA  DME Arranged:    DME Agency:     HH Arranged:    [place] Agency:     Status of Service:  Completed, signed off  Medicare Important Message Given:    Date Medicare IM Given:    Medicare IM give by:    Date Additional Medicare IM Given:    Additional Medicare Important Message give by:     If discussed at [place] of Stay Meetings, dates discussed:    Additional Comments: Pt admitted with headache, chest numbness, elevated troponins. Pt is from home, lives with family. Pt ind. With ADL's. Pt has no HH services, DME or med needs prior to admission. Pt plans to return home with self care at DC. No CM needs noted.  Sherald Barge, RN 07/18/2015, 1:32 PM

## 2015-07-18 NOTE — Consult Note (Signed)
CARDIOLOGY CONSULT NOTE   Patient ID: Sarah Cooley MRN: [id number] DOB/AGE: 27-Oct-1965 49 y.o.  Admit Date: 07/17/2015 Referring Physician: PTH-Memon  Primary Physician: Delia Chimes, NP Consulting Cardiologist: Sarah Dolly MD Primary Cardiologist: New Reason for Consultation: Elevated troponin   Clinical Summary [name] is a 49 y.o.female with history of hypertension, hypercholesterolemia, "boarderline diabetes" OSA, fibromyalgia, and depression, who came to ER with complaints of headache, and chest pain. She has no previous cardiac history.   She was eating at [facility] finishing a salad when she has a sudden onset of a severe sharp pain in the back of her head. She felt dizzy. 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 She laid down and put herself in trendelenburg. Had her BP checked, and found it to be elevated. Came to ER for further evaluation.    On arrival BP was elevated at 191/102, HR 77, O2 sat 100%, afebrile. Troponin was elevated at 0.15  and 0.19 respectively, with this am troponin of 0.10. Potassium 3.4. CT scan was negative for bleeding or other intracranial abnormalities. EKG demonstrated NSR with non-specific ST changes. He was treated with clonidine, morphine and IV fluids. He is admitted for further evaluation.   She states she feels better now but has overall weakness. She has been under a lot of stress and did have some anxiety with crying at home,  prior to the episode at [facility].   Allergies  Allergen Reactions  . Shrimp [Shellfish Allergy] Anaphylaxis and Swelling  . Erythromycin Other (See Comments)    GI- Upset   . Minocycline Hcl Hives  . Ramipril Other (See Comments)    Ramipril--Caused Pancreatitis  . Drixoral [Brompheniramine-Pseudoeph] Rash  . Sinutab [Chlorphen-Pseudoephed-Apap] Rash    Medications Scheduled Medications: . atenolol  25 mg Oral Daily  . atorvastatin  40 mg Oral  QHS  . cloNIDine  0.1 mg Oral Once  . enoxaparin (LOVENOX) injection  40 mg Subcutaneous Q24H  . FLUoxetine  60 mg Oral Daily  . gabapentin  600 mg Oral QHS  . hydrochlorothiazide  25 mg Oral Daily  . Influenza vac split quadrivalent PF  0.5 mL Intramuscular Tomorrow-1000  . insulin aspart  0-20 Units Subcutaneous TID WC  . insulin aspart  0-5 Units Subcutaneous QHS  . losartan  100 mg Oral Daily  . metFORMIN  500 mg Oral BID WC  . pneumococcal 23 valent vaccine  0.5 mL Intramuscular Tomorrow-1000  . primidone  50 mg Oral QHS  . sodium chloride  3 mL Intravenous Q12H  . sodium chloride  3 mL Intravenous Q12H  . vitamin E  400 Units Oral Daily      PRN Medications: clonazePAM, diclofenac, docusate sodium, fluticasone, HYDROcodone-acetaminophen, loratadine, ondansetron **OR** ondansetron (ZOFRAN) IV   Past Medical History  Diagnosis Date  . Fibromyalgia   . Depression   . OSA (obstructive sleep apnea)   . Hypertension   . Hyperlipidemia     Past Surgical History  Procedure Laterality Date  . Bone graft hip iliac crest    . Abdominal hysterectomy    . Bladder surgery    . Knee arthroscopy w/ acl reconstruction      right  . Cholecystectomy      Family History  Problem Relation Age of Onset  . Hypertension Mother   . Lupus Sister   . Heart disease Maternal Aunt     Social History [name] reports that  she has never smoked. She does not have any smokeless tobacco history on file. [name] reports that she drinks alcohol.  Review of Systems Complete review of systems are found to be negative unless outlined in H&P above.  Physical Examination Blood pressure 116/74, pulse 53, temperature 98.5 F (36.9 C), temperature source Oral, resp. rate 16, height 5\' 8"  (1.727 m), weight 226 lb 11.2 oz (102.83 kg), SpO2 96 %.  Intake/Output Summary (Last 24 hours) at 07/18/15 0935 Last data filed at 07/18/15 0600  Gross per 24 hour  Intake      0 ml  Output    600 ml   Net   -600 ml    Telemetry: NSR  GEN: No acute distress  HEENT: Conjunctiva and lids normal, oropharynx clear with moist mucosa. Neck: Supple, no elevated JVP or carotid bruits, no thyromegaly. Lungs: Clear to auscultation, nonlabored breathing at rest. Cardiac: Regular rate and rhythm, no S3 or significant systolic murmur, no pericardial rub. Abdomen: Soft, nontender, no hepatomegaly, bowel sounds present, no guarding or rebound. Extremities: No pitting edema, distal pulses 2+. Skin: Warm and dry. Musculoskeletal: No kyphosis. Neuropsychiatric: Alert and oriented x3, affect grossly appropriate.  Prior Cardiac Testing/Procedures  Lab Results  Basic Metabolic Panel:  Recent Labs Lab 07/17/15 1555 07/17/15 2104 07/18/15 0508  NA 137  --  138  K 3.3*  --  3.4*  CL 101  --  102  CO2 26  --  26  GLUCOSE 98  --  93  BUN 13  --  12  CREATININE 0.71 0.72 0.79  CALCIUM 9.0  --  8.3*    Liver Function Tests:  Recent Labs Lab 07/18/15 0508  AST 29  ALT 36  ALKPHOS 54  BILITOT 0.7  PROT 6.9  ALBUMIN 3.7    CBC:  Recent Labs Lab 07/17/15 1555 07/17/15 2104 07/18/15 0508  WBC 8.7 12.1* 11.4*  NEUTROABS 6.2  --   --   HGB 14.5 14.1 13.4  HCT 41.5 40.8 39.4  MCV 92.6 92.3 94.0  PLT 201 209 200    Cardiac Enzymes:  Recent Labs Lab 07/17/15 1839 07/17/15 2104 07/18/15 0340  TROPONINI 0.15* 0.19* 0.10*    Radiology: Ct Head Wo Contrast  07/17/2015   CLINICAL DATA:  49 year old female with sudden onset of stabbing pain in the back of the head 30 minutes prior to admission.  EXAM: CT HEAD WITHOUT CONTRAST  TECHNIQUE: Contiguous axial images were obtained from the base of the skull through the vertex without intravenous contrast.  COMPARISON:  No priors.  FINDINGS: No acute intracranial abnormalities. Specifically, no evidence of acute intracranial hemorrhage, no definite findings of acute/subacute cerebral ischemia, no mass, mass effect, hydrocephalus or  abnormal intra or extra-axial fluid collections. Visualized paranasal sinuses and mastoids are well pneumatized. No acute displaced skull fractures are identified.  IMPRESSION: 1. No acute intracranial abnormalities. 2. The appearance of the brain is normal.   Electronically Signed   By: Vinnie Langton M.D.   On: 07/17/2015 16:56     ECG: NSR with non-specific changes    Impression and Recommendations  1. Hypertension: Elevated on arrival to ER. She denies medical non-compliance. She states that she has been followed for the BP and it is usually under control. Currently while hospitalized she is normotensive. She has been eating salty foods and using [place]. Echo for LV fx.   2. Elevated Troponin: Mild elevation. She has risk factors for CAD with hypertension, hypercholesterolemia, diabetes, and  obesity. Will plan a Lexiscan stress test (she has balance issues) either as IP or OP. She has been kept NPO. I have checked with nuclear medicine. They have had some "no-shows' and have a dose available this am. . EKG does not reveal ischemic changes.   3. Hypercholesterolemia: She is currently on atorvastatin.   4. Diabetes: On metformin and now sliding scale insulin. She admits to not being compliant with dietary restrictions.   5. Headache: She had a normal CT of head. She is now pain free. May have been related to hypertension.   Signed: Phill Myron. Lawrence NP [place]    07/18/2015, 9:35 AM Co-Sign MD  Patient seen and discussed with NP Lawerence, I agree with her documentation above. 49 yo female history of fibromyalgia, depression, OSA, HTN, HL admitted with headache, weakness, and chest numbness. Symptoms started yesterday at lunch. Initially developed severe occipital headache with some nausea, followed by a "numbness" in her midchest lasting several minutes. No SOB or palpitations. She does report a several month history of intermittent chest pain, often with activity or stress. She  describes that as a sharp pain mid to right chest. Reports this pain has increased in frequency over the last few months, stable in severity   ER vitals BP 191/102 p 77 100% RA TSH 3.8, Hgb 14.5, Plt 201, K 3.3, Cr 0.71,  Trop .15-->0.19-->0.10-->0.06 CT head negative   Mild troponin elevation that has since trended down. EKG without specific ischemic changes. Patient presented severely hypertensive with SBPs in the 190s, this may have been caused by demand ischemia. She does have CAD risk factors with DM2,HTN and HL and a several month history of intermittent chest pain.  We will f/u echo results, pending results consider ischemic testing. She ate breakfast this AM so will not be able to stress today. Make NPO for tomorrow for Lexiscan, she reports chronic balance problems and is not able to run on treadmill.   Zandra Abts MD

## 2015-07-18 NOTE — Progress Notes (Signed)
EKG done and handed off RN

## 2015-07-18 NOTE — Progress Notes (Signed)
[facility] PROGRESS NOTE  Sarah Cooley HUD:[id number] DOB: [birth year] DOA: 07/17/2015 PCP: Delia Chimes, NP  Assessment/Plan: 1. Occipital headache, possibly related to hypertensive urgency vs cervicogenic headache. Improved but still present now that her BP has normalized. Head CT negative.  Neurology consulted and she may benefit from MRI of the brain / C-spine.   2. Elevated troponin. She denies any CP or SOB.  Cardiology input appreciated. They recommend a stress test, possibly this morning.  Suspect that her elevated troponin is from a demand ischemia.  3. Hypertensive urgency, she reports that her Atenolol was discontinued as an outpatient due to symptomatic bradycardia. Will hold off on further Atenolol for now. Continue Losartan and HCTZ. 4. DM, continue Metformin and SSI.   5. Obesity.   6. HLD. Continue statin.   Code Status: Full DVT Prophylaxis:Lovenox Family Communication: Family at bedside. Discussed with patient who understands and has no concerns at this time. Disposition Plan: Anticipate discharge within the next 24 hours.     Consultants:  Neurology  Cardiology  Procedures:     Antibiotics:     HPI/Subjective: Feels somewhat improved. Still has an occipital headache radiating into her neck but not as severe as yesterday. Reports visual changes for the last year but denies any acute changes in the last 2 days. Has some nausea secondary to her HA but denies any vomiting or unilateral weakness.    Objective: Filed Vitals:   07/18/15 0733  BP: 116/74  Pulse: 53  Temp: 98.5 F (36.9 C)  Resp: 16    Intake/Output Summary (Last 24 hours) at 07/18/15 0825 Last data filed at 07/18/15 0600  Gross per 24 hour  Intake      0 ml  Output    600 ml  Net   -600 ml   Filed Weights   07/17/15 1435 07/17/15 2232  Weight: 103.42 kg (228 lb) 102.83 kg (226 lb 11.2 oz)    Exam:  General: NAD, looks comfortable Cardiovascular: RRR, S1, S2   Respiratory: clear bilaterally, No wheezing, rales or rhonchi Abdomen: soft, non tender, no distention , bowel sounds normal Musculoskeletal: No edema b/l, soreness over trapezius bilaterally, decreased flexion/extension of her neck   Data Reviewed: Basic Metabolic Panel:  Recent Labs Lab 07/17/15 1555 07/17/15 2104 07/18/15 0508  NA 137  --  138  K 3.3*  --  3.4*  CL 101  --  102  CO2 26  --  26  GLUCOSE 98  --  93  BUN 13  --  12  CREATININE 0.71 0.72 0.79  CALCIUM 9.0  --  8.3*   Liver Function Tests:  Recent Labs Lab 07/18/15 0508  AST 29  ALT 36  ALKPHOS 54  BILITOT 0.7  PROT 6.9  ALBUMIN 3.7   CBC:  Recent Labs Lab 07/17/15 1555 07/17/15 2104 07/18/15 0508  WBC 8.7 12.1* 11.4*  NEUTROABS 6.2  --   --   HGB 14.5 14.1 13.4  HCT 41.5 40.8 39.4  MCV 92.6 92.3 94.0  PLT 201 209 200   Cardiac Enzymes:  Recent Labs Lab 07/17/15 1839 07/17/15 2104 07/18/15 0340  TROPONINI 0.15* 0.19* 0.10*   CBG:  Recent Labs Lab 07/17/15 2221 07/18/15 0718  GLUCAP 104* 105*     Studies: Ct Head Wo Contrast  07/17/2015   CLINICAL DATA:  49 year old female with sudden onset of stabbing pain in the back of the head 30 minutes prior to admission.  EXAM: CT HEAD WITHOUT CONTRAST  TECHNIQUE:  Contiguous axial images were obtained from the base of the skull through the vertex without intravenous contrast.  COMPARISON:  No priors.  FINDINGS: No acute intracranial abnormalities. Specifically, no evidence of acute intracranial hemorrhage, no definite findings of acute/subacute cerebral ischemia, no mass, mass effect, hydrocephalus or abnormal intra or extra-axial fluid collections. Visualized paranasal sinuses and mastoids are well pneumatized. No acute displaced skull fractures are identified.  IMPRESSION: 1. No acute intracranial abnormalities. 2. The appearance of the brain is normal.   Electronically Signed   By: Vinnie Langton M.D.   On: 07/17/2015 16:56     Scheduled Meds: . atenolol  25 mg Oral Daily  . atorvastatin  40 mg Oral QHS  . cloNIDine  0.1 mg Oral Once  . enoxaparin (LOVENOX) injection  40 mg Subcutaneous Q24H  . FLUoxetine  60 mg Oral Daily  . gabapentin  600 mg Oral QHS  . hydrochlorothiazide  25 mg Oral Daily  . Influenza vac split quadrivalent PF  0.5 mL Intramuscular Tomorrow-1000  . insulin aspart  0-20 Units Subcutaneous TID WC  . insulin aspart  0-5 Units Subcutaneous QHS  . losartan  100 mg Oral Daily  . metFORMIN  500 mg Oral BID WC  . pneumococcal 23 valent vaccine  0.5 mL Intramuscular Tomorrow-1000  . primidone  50 mg Oral QHS  . sodium chloride  3 mL Intravenous Q12H  . sodium chloride  3 mL Intravenous Q12H  . vitamin E  400 Units Oral Daily   Continuous Infusions:   Active Problems:   Headache   Elevated troponin   Hypertension   Obesity   Time spent: 25 minutes   Jehanzeb Memon. MD  [facility] Pager [phone]. If 7PM-7AM, please contact night-coverage at [email or web address], password [facility] 07/18/2015, 8:25 AM  LOS: 1 day     By signing my name below, I, Rosalie Doctor, attest that this documentation has been prepared under the direction and in the presence of [facility]. MD Electronically Signed: Rosalie Doctor, Scribe. 07/18/2015 9:41am  I, Dr. Kathie Dike, personally performed the services described in this documentaiton. All medical record entries made by the scribe were at my direction and in my presence. I have reviewed the chart and agree that the record reflects my personal performance and is accurate and complete  Kathie Dike, MD, 07/18/2015 9:56 AM

## 2015-07-18 NOTE — Consult Note (Signed)
[place] A. Merlene Laughter, MD     [email or web address]          Sarah Cooley is an 49 y.o. female.   ASSESSMENT/PLAN: 1. Acute severe headache worrisome for thunderclap headaches which should be evaluated further especially for the possibility of subarachnoid hemorrhage due to aneurysmal bleed. There is a family history of subarachnoid hemorrhage due to aneurysmal hemorrhage. The patient therefore be set up to have a spinal tap to evaluate for cerebral hemorrhage. 2. Chronic headaches in the past both tension-type headache and a remote history of migraine headaches. 3. Essential tremor 4. Chronic fibromyalgia 5. Obstructive sleep apnea syndrome 6. Obesity   The a 49 year old white female who does have episodic posterior headaches although not it frequent. These headaches are mild to moderate in severity and associated with psychosocial stresses. However, the patient on yesterday was eating when she developed the acute onset of severe headache essentially almost maximal at onset involving the posterior head region. The patient just did not feel right and decided to seek medical attention. The headaches were associated with nausea but no vomiting. No photophobia or phonophobia. She does have baseline headaches again in the same location although these are typically mild and not associated with nausea and vomiting. She has had about 2-3 migraine headaches associated with photophobia, phonophobia and nausea. These 3 episodes occurred long time ago. The patient does admit that she has been on increased psychosocial stresses recently with various issues including her daughter is getting married. The patient has been treated with IV morphine and has had a moderate improvement in her headaches. She tells me that she has a first cousin who died in the 53s after collapsing abruptly due to complications of aneurysmal hemorrhage in the brain. She has recently been seen by Dr. Carles Collet  neurologist in [place] evaluated for several issues. Notes are reviewed. The patient is a history of obstructive sleep apnea syndrome and is compliant with her CPAP machine. Review of systems is otherwise negative. GENERAL: This is a pleasant overweight female in no acute distress.  HEENT: Supple. Atraumatic normocephalic. She does have a crowded posterior space. Funduscopic examination shows clear flat disc with spontaneous venous pulsations.  ABDOMEN: soft  EXTREMITIES: No edema   BACK: Normal.  SKIN: Normal by inspection.    MENTAL STATUS: Alert and oriented. Speech, language and cognition are generally intact. Judgment and insight normal.   CRANIAL NERVES: Pupils are equal, round and reactive to light and accommodation; extra ocular movements are full, there is no significant nystagmus; visual fields are full; upper and lower facial muscles are normal in strength and symmetric, there is no flattening of the nasolabial folds; tongue is midline; uvula is midline; shoulder elevation is normal.  MOTOR: Normal tone, bulk and strength; no pronator drift.  COORDINATION: Left finger to nose is normal, right finger to nose is normal, No rest tremor; no intention tremor; no postural tremor; no bradykinesia.  REFLEXES: Deep tendon reflexes are symmetrical and normal. Babinski reflexes are flexor bilaterally.   SENSATION: Normal to light touch.      [[[[[[[[[[[[[[[[[[OUTPT NEUROLOGIST NOTE 1. Essential Tremor. -Clinically, I saw almost no tremor today but pt didn't feel that increasing her gabapentin that she was already on for fibromyalgia was helpful for tremor. -Wants to try primidone, 50 mg at night. States that her PCP did repeat LFT's and were normal. I did call PCP for labs yesterday and got labs from 03/31/15 but this only included lipids and vitamin D. Will  try to get a copy -decrease gabapentin to 600 mg q hs 2. Loss of  balance. -The patient asked me several questions about what could be causing this. I reiterated that I could not find a primary neurologic etiology for balance change. She has had an extensive neurologic work up (MRI brain, cervical spine, EMG) and there was nothing to suggest a primary neurologic etiology for balance change. I told her that this may be medication but she needs to f/u with PCP to discuss further. 3. Fibromyalgia -This is long-standing and she is under the care of her primary care physician for this. 4. She will follow-up with me in the next few months, sooner should new neurologic issues arise.]]]]]]]]]]]]]]]]]      Blood pressure 103/60, pulse 60, temperature 98.5 F (36.9 C), temperature source Oral, resp. rate 18, height 5' 8"  (1.727 m), weight 102.83 kg (226 lb 11.2 oz), SpO2 98 %.  Past Medical History  Diagnosis Date  . Fibromyalgia   . Depression   . OSA (obstructive sleep apnea)   . Hypertension   . Hyperlipidemia     Past Surgical History  Procedure Laterality Date  . Bone graft hip iliac crest    . Abdominal hysterectomy    . Bladder surgery    . Knee arthroscopy w/ acl reconstruction      right  . Cholecystectomy      Family History  Problem Relation Age of Onset  . Hypertension Mother   . Lupus Sister   . Heart disease Maternal Aunt     Social History:  reports that she has never smoked. She does not have any smokeless tobacco history on file. She reports that she drinks alcohol. She reports that she does not use illicit drugs.  Allergies:  Allergies  Allergen Reactions  . Shrimp [Shellfish Allergy] Anaphylaxis and Swelling  . Erythromycin Other (See Comments)    GI- Upset   . Minocycline Hcl Hives  . Ramipril Other (See Comments)    Ramipril--Caused Pancreatitis  . Drixoral [Brompheniramine-Pseudoeph] Rash  . Sinutab [Chlorphen-Pseudoephed-Apap] Rash    Medications: Prior to Admission medications    Medication Sig Start Date End Date Taking? Authorizing Provider  atorvastatin (LIPITOR) 40 MG tablet Take 40 mg by mouth daily.   Yes Historical Provider, MD  clonazePAM (KLONOPIN) 1 MG tablet Take 1 mg by mouth 3 (three) times daily as needed. For anxiety   Yes Historical Provider, MD  Coenzyme Q10 (CO Q 10) 10 MG CAPS Take 1 capsule by mouth daily.   Yes Historical Provider, MD  diclofenac (VOLTAREN) 75 MG EC tablet Take 75 mg by mouth 2 (two) times daily as needed for mild pain.    Yes Historical Provider, MD  docusate sodium (COLACE) 100 MG capsule Take 100 mg by mouth daily as needed for mild constipation.   Yes Historical Provider, MD  FLUoxetine (PROZAC) 20 MG capsule Take 60 mg by mouth daily.    Yes Historical Provider, MD  gabapentin (NEURONTIN) 600 MG tablet Take 1 tablet (600 mg total) by mouth 2 (two) times daily. Patient taking differently: Take 600 mg by mouth at bedtime.  04/01/15  Yes Rebecca S Tat, DO  hydrochlorothiazide (HYDRODIURIL) 25 MG tablet Take 25 mg by mouth daily.   Yes Historical Provider, MD  HYDROcodone-acetaminophen (NORCO/VICODIN) 5-325 MG per tablet Take 0.5-1 tablets by mouth every 6 (six) hours as needed for moderate pain.  05/10/15  Yes Historical Provider, MD  ibuprofen (ADVIL,MOTRIN) 200 MG tablet Take 800  mg by mouth every 6 (six) hours as needed for mild pain.   Yes Historical Provider, MD  losartan (COZAAR) 100 MG tablet Take 100 mg by mouth daily.   Yes Historical Provider, MD  metFORMIN (GLUCOPHAGE) 500 MG tablet Take 500 mg by mouth 2 (two) times daily with a meal.   Yes Historical Provider, MD  primidone (MYSOLINE) 50 MG tablet Take 1 tablet (50 mg total) by mouth at bedtime. 05/06/15  Yes Rebecca S Tat, DO  triamcinolone (NASACORT ALLERGY 24HR) 55 MCG/ACT AERO nasal inhaler Place 2 sprays into the nose daily as needed (Allergies).   Yes Historical Provider, MD  vitamin E 400 UNIT capsule Take 400 Units by mouth daily.   Yes Historical Provider, MD   atenolol (TENORMIN) 50 MG tablet Take 25 mg by mouth daily.     Historical Provider, MD  cetirizine (ZYRTEC) 10 MG tablet Take 10 mg by mouth daily as needed for allergies.     Historical Provider, MD    Scheduled Meds: . aspirin EC  81 mg Oral Daily  . atorvastatin  40 mg Oral QHS  . cloNIDine  0.1 mg Oral Once  . enoxaparin (LOVENOX) injection  40 mg Subcutaneous Q24H  . FLUoxetine  60 mg Oral Daily  . gabapentin  600 mg Oral QHS  . hydrochlorothiazide  25 mg Oral Daily  . insulin aspart  0-20 Units Subcutaneous TID WC  . insulin aspart  0-5 Units Subcutaneous QHS  . losartan  100 mg Oral Daily  . metFORMIN  500 mg Oral BID WC  . primidone  50 mg Oral QHS  . sodium chloride  3 mL Intravenous Q12H  . sodium chloride  3 mL Intravenous Q12H  . vitamin E  400 Units Oral Daily   Continuous Infusions:  PRN Meds:.clonazePAM, diclofenac, docusate sodium, fluticasone, HYDROcodone-acetaminophen, loratadine, ondansetron **OR** ondansetron (ZOFRAN) IV     Results for orders placed or performed during the hospital encounter of 07/17/15 (from the past 48 hour(s))  Basic metabolic panel     Status: Abnormal   Collection Time: 07/17/15  3:55 PM  Result Value Ref Range   Sodium 137 135 - 145 mmol/L   Potassium 3.3 (L) 3.5 - 5.1 mmol/L   Chloride 101 101 - 111 mmol/L   CO2 26 22 - 32 mmol/L   Glucose, Bld 98 65 - 99 mg/dL   BUN 13 6 - 20 mg/dL   Creatinine, Ser 0.71 0.44 - 1.00 mg/dL   Calcium 9.0 8.9 - 10.3 mg/dL   GFR calc non Af Amer >60 >60 mL/min   GFR calc Af Amer >60 >60 mL/min    Comment: (NOTE) The eGFR has been calculated using the CKD EPI equation. This calculation has not been validated in all clinical situations. eGFR's persistently <60 mL/min signify possible Chronic Kidney Disease.    Anion gap 10 5 - 15  CBC with Differential     Status: None   Collection Time: 07/17/15  3:55 PM  Result Value Ref Range   WBC 8.7 4.0 - 10.5 K/uL   RBC 4.48 3.87 - 5.11 MIL/uL    Hemoglobin 14.5 12.0 - 15.0 g/dL   HCT 41.5 36.0 - 46.0 %   MCV 92.6 78.0 - 100.0 fL   MCH 32.4 26.0 - 34.0 pg   MCHC 34.9 30.0 - 36.0 g/dL   RDW 12.8 11.5 - 15.5 %   Platelets 201 150 - 400 K/uL   Neutrophils Relative % 71 %  Neutro Abs 6.2 1.7 - 7.7 K/uL   Lymphocytes Relative 22 %   Lymphs Abs 1.9 0.7 - 4.0 K/uL   Monocytes Relative 6 %   Monocytes Absolute 0.5 0.1 - 1.0 K/uL   Eosinophils Relative 1 %   Eosinophils Absolute 0.1 0.0 - 0.7 K/uL   Basophils Relative 0 %   Basophils Absolute 0.0 0.0 - 0.1 K/uL  TSH     Status: None   Collection Time: 07/17/15  3:55 PM  Result Value Ref Range   TSH 1.726 0.350 - 4.500 uIU/mL  Troponin I     Status: Abnormal   Collection Time: 07/17/15  6:39 PM  Result Value Ref Range   Troponin I 0.15 (H) <0.031 ng/mL    Comment:        PERSISTENTLY INCREASED TROPONIN VALUES IN THE RANGE OF 0.04-0.49 ng/mL CAN BE SEEN IN:       -UNSTABLE ANGINA       -CONGESTIVE HEART FAILURE       -MYOCARDITIS       -CHEST TRAUMA       -ARRYHTHMIAS       -LATE PRESENTING MYOCARDIAL INFARCTION       -COPD   CLINICAL FOLLOW-UP RECOMMENDED.   CBC     Status: Abnormal   Collection Time: 07/17/15  9:04 PM  Result Value Ref Range   WBC 12.1 (H) 4.0 - 10.5 K/uL   RBC 4.42 3.87 - 5.11 MIL/uL   Hemoglobin 14.1 12.0 - 15.0 g/dL   HCT 40.8 36.0 - 46.0 %   MCV 92.3 78.0 - 100.0 fL   MCH 31.9 26.0 - 34.0 pg   MCHC 34.6 30.0 - 36.0 g/dL   RDW 12.8 11.5 - 15.5 %   Platelets 209 150 - 400 K/uL  Creatinine, serum     Status: None   Collection Time: 07/17/15  9:04 PM  Result Value Ref Range   Creatinine, Ser 0.72 0.44 - 1.00 mg/dL   GFR calc non Af Amer >60 >60 mL/min   GFR calc Af Amer >60 >60 mL/min    Comment: (NOTE) The eGFR has been calculated using the CKD EPI equation. This calculation has not been validated in all clinical situations. eGFR's persistently <60 mL/min signify possible Chronic Kidney Disease.   Troponin I     Status: Abnormal    Collection Time: 07/17/15  9:04 PM  Result Value Ref Range   Troponin I 0.19 (H) <0.031 ng/mL    Comment:        PERSISTENTLY INCREASED TROPONIN VALUES IN THE RANGE OF 0.04-0.49 ng/mL CAN BE SEEN IN:       -UNSTABLE ANGINA       -CONGESTIVE HEART FAILURE       -MYOCARDITIS       -CHEST TRAUMA       -ARRYHTHMIAS       -LATE PRESENTING MYOCARDIAL INFARCTION       -COPD   CLINICAL FOLLOW-UP RECOMMENDED.   TSH     Status: None   Collection Time: 07/17/15  9:04 PM  Result Value Ref Range   TSH 3.845 0.350 - 4.500 uIU/mL  Glucose, capillary     Status: Abnormal   Collection Time: 07/17/15 10:21 PM  Result Value Ref Range   Glucose-Capillary 104 (H) 65 - 99 mg/dL   Comment 1 Notify RN    Comment 2 Document in Chart   Troponin I     Status: Abnormal   Collection Time: 07/18/15  3:40  AM  Result Value Ref Range   Troponin I 0.10 (H) <0.031 ng/mL    Comment:        PERSISTENTLY INCREASED TROPONIN VALUES IN THE RANGE OF 0.04-0.49 ng/mL CAN BE SEEN IN:       -UNSTABLE ANGINA       -CONGESTIVE HEART FAILURE       -MYOCARDITIS       -CHEST TRAUMA       -ARRYHTHMIAS       -LATE PRESENTING MYOCARDIAL INFARCTION       -COPD   CLINICAL FOLLOW-UP RECOMMENDED.   Comprehensive metabolic panel     Status: Abnormal   Collection Time: 07/18/15  5:08 AM  Result Value Ref Range   Sodium 138 135 - 145 mmol/L   Potassium 3.4 (L) 3.5 - 5.1 mmol/L   Chloride 102 101 - 111 mmol/L   CO2 26 22 - 32 mmol/L   Glucose, Bld 93 65 - 99 mg/dL   BUN 12 6 - 20 mg/dL   Creatinine, Ser 0.79 0.44 - 1.00 mg/dL   Calcium 8.3 (L) 8.9 - 10.3 mg/dL   Total Protein 6.9 6.5 - 8.1 g/dL   Albumin 3.7 3.5 - 5.0 g/dL   AST 29 15 - 41 U/L   ALT 36 14 - 54 U/L   Alkaline Phosphatase 54 38 - 126 U/L   Total Bilirubin 0.7 0.3 - 1.2 mg/dL   GFR calc non Af Amer >60 >60 mL/min   GFR calc Af Amer >60 >60 mL/min    Comment: (NOTE) The eGFR has been calculated using the CKD EPI equation. This calculation has not been  validated in all clinical situations. eGFR's persistently <60 mL/min signify possible Chronic Kidney Disease.    Anion gap 10 5 - 15  CBC     Status: Abnormal   Collection Time: 07/18/15  5:08 AM  Result Value Ref Range   WBC 11.4 (H) 4.0 - 10.5 K/uL   RBC 4.19 3.87 - 5.11 MIL/uL   Hemoglobin 13.4 12.0 - 15.0 g/dL   HCT 39.4 36.0 - 46.0 %   MCV 94.0 78.0 - 100.0 fL   MCH 32.0 26.0 - 34.0 pg   MCHC 34.0 30.0 - 36.0 g/dL   RDW 13.0 11.5 - 15.5 %   Platelets 200 150 - 400 K/uL  Glucose, capillary     Status: Abnormal   Collection Time: 07/18/15  7:18 AM  Result Value Ref Range   Glucose-Capillary 105 (H) 65 - 99 mg/dL   Comment 1 Notify RN    Comment 2 Document in Chart   Troponin I     Status: Abnormal   Collection Time: 07/18/15  9:03 AM  Result Value Ref Range   Troponin I 0.06 (H) <0.031 ng/mL    Comment:        PERSISTENTLY INCREASED TROPONIN VALUES IN THE RANGE OF 0.04-0.49 ng/mL CAN BE SEEN IN:       -UNSTABLE ANGINA       -CONGESTIVE HEART FAILURE       -MYOCARDITIS       -CHEST TRAUMA       -ARRYHTHMIAS       -LATE PRESENTING MYOCARDIAL INFARCTION       -COPD   CLINICAL FOLLOW-UP RECOMMENDED.   Magnesium     Status: None   Collection Time: 07/18/15  9:03 AM  Result Value Ref Range   Magnesium 2.0 1.7 - 2.4 mg/dL  Glucose, capillary     Status:  None   Collection Time: 07/18/15 11:34 AM  Result Value Ref Range   Glucose-Capillary 91 65 - 99 mg/dL   Comment 1 Notify RN    Comment 2 Document in Chart   Glucose, capillary     Status: None   Collection Time: 07/18/15  4:26 PM  Result Value Ref Range   Glucose-Capillary 88 65 - 99 mg/dL   Comment 1 Notify RN    Comment 2 Document in Chart     Studies/Results:     Sarah Cooley, M.D.  Diplomate, [employer] adviser of Psychiatry and Neurology ( Neurology). 07/18/2015, 6:21 PM

## 2015-07-19 ENCOUNTER — Observation Stay (HOSPITAL_COMMUNITY): Payer: BC Managed Care – PPO

## 2015-07-19 ENCOUNTER — Encounter (HOSPITAL_COMMUNITY): Payer: Self-pay

## 2015-07-19 ENCOUNTER — Observation Stay (HOSPITAL_BASED_OUTPATIENT_CLINIC_OR_DEPARTMENT_OTHER): Payer: BC Managed Care – PPO

## 2015-07-19 DIAGNOSIS — R51 Headache: Secondary | ICD-10-CM

## 2015-07-19 DIAGNOSIS — I10 Essential (primary) hypertension: Secondary | ICD-10-CM | POA: Diagnosis not present

## 2015-07-19 DIAGNOSIS — R079 Chest pain, unspecified: Secondary | ICD-10-CM | POA: Insufficient documentation

## 2015-07-19 DIAGNOSIS — R519 Headache, unspecified: Secondary | ICD-10-CM | POA: Insufficient documentation

## 2015-07-19 DIAGNOSIS — R7989 Other specified abnormal findings of blood chemistry: Secondary | ICD-10-CM | POA: Diagnosis not present

## 2015-07-19 LAB — CSF CELL COUNT WITH DIFFERENTIAL
RBC COUNT CSF: 3 /mm3 — AB
Tube #: 4
WBC CSF: 0 /mm3 (ref 0–5)

## 2015-07-19 LAB — GLUCOSE, CAPILLARY
GLUCOSE-CAPILLARY: 109 mg/dL — AB (ref 65–99)
Glucose-Capillary: 81 mg/dL (ref 65–99)
Glucose-Capillary: 106 mg/dL — ABNORMAL HIGH (ref 65–99)
Glucose-Capillary: 95 mg/dL (ref 65–99)

## 2015-07-19 LAB — NM MYOCAR MULTI W/SPECT W/WALL MOTION / EF
CHL CUP NUCLEAR SDS: 3
CHL CUP RESTING HR STRESS: 61 {beats}/min
CSEPPHR: 108 {beats}/min
LHR: 0.37
LVDIAVOL: 64 mL
LVSYSVOL: 24 mL
NUC STRESS TID: 0.91
SRS: 0
SSS: 3

## 2015-07-19 LAB — CRYPTOCOCCAL ANTIGEN, CSF: CRYPTO AG: NEGATIVE

## 2015-07-19 LAB — PROTEIN, CSF: TOTAL PROTEIN, CSF: 44 mg/dL (ref 15–45)

## 2015-07-19 LAB — ANTI-DNA ANTIBODY, DOUBLE-STRANDED

## 2015-07-19 LAB — GLUCOSE, CSF: Glucose, CSF: 63 mg/dL (ref 40–70)

## 2015-07-19 MED ORDER — REGADENOSON 0.4 MG/5ML IV SOLN
INTRAVENOUS | Status: AC
  Administered 2015-07-19: 0.4 mg via INTRAVENOUS
  Filled 2015-07-19: qty 5

## 2015-07-19 MED ORDER — TECHNETIUM TC 99M SESTAMIBI GENERIC - CARDIOLITE
30.0000 | Freq: Once | INTRAVENOUS | Status: AC | PRN
  Administered 2015-07-19: 28 via INTRAVENOUS

## 2015-07-19 MED ORDER — TECHNETIUM TC 99M SESTAMIBI - CARDIOLITE
10.0000 | Freq: Once | INTRAVENOUS | Status: AC | PRN
  Administered 2015-07-19: 07:00:00 9 via INTRAVENOUS

## 2015-07-19 MED ORDER — REGADENOSON 0.4 MG/5ML IV SOLN
0.4000 mg | Freq: Once | INTRAVENOUS | Status: AC
  Filled 2015-07-19: qty 5

## 2015-07-19 MED ORDER — SODIUM CHLORIDE 0.9 % IJ SOLN
INTRAMUSCULAR | Status: AC
  Administered 2015-07-19: 10 mL via INTRAVENOUS
  Filled 2015-07-19: qty 3

## 2015-07-19 NOTE — Procedures (Signed)
Preprocedure Dx: Headache, imbalance Postprocedure Dx: Headache, imbalance Procedure:  Fluoroscopically guided lumbar puncture Radiologist:  Thornton Papas Anesthesia:  1.5 ml of 1% lidocaine Specimen:  11 ml CSF, clear colorless EBL:   None Opening pressure: 6 cm Z8K (prone) Complications: None

## 2015-07-19 NOTE — Care Management Note (Signed)
Case Management Note  Patient Details  Name: Sarah Cooley MRN: [id number] Date of Birth: June 10, 1966  Expected Discharge Date:    07/19/2015              Expected Discharge Plan:  Home/Self Care  In-House Referral:  NA  Discharge planning Services  CM Consult  Post Acute Care Choice:  NA Choice offered to:  NA  DME Arranged:    DME Agency:     HH Arranged:    [place] Agency:     Status of Service:  Completed, signed off  Medicare Important Message Given:    Date Medicare IM Given:    Medicare IM give by:    Date Additional Medicare IM Given:    Additional Medicare Important Message give by:     If discussed at [place] of Stay Meetings, dates discussed:    Additional Comments: [place] home today with self care. No CM needs noted.  Sherald Barge, RN 07/19/2015, 12:59 PM

## 2015-07-19 NOTE — Progress Notes (Addendum)
Subjective:    No complaints  Objective:   Temp:  [97.9 F (36.6 C)-99.3 F (37.4 C)] 99.3 F (37.4 C) (09/27 0530) Pulse Rate:  [53-60] 55 (09/27 0530) Resp:  [18] 18 (09/27 0530) BP: (103-115)/(60-70) 115/70 mmHg (09/27 0530) SpO2:  [94 %-98 %] 95 % (09/27 0530) Last BM Date: 07/17/15  Filed Weights   07/17/15 1435 07/17/15 2232  Weight: 228 lb (103.42 kg) 226 lb 11.2 oz (102.83 kg)    Intake/Output Summary (Last 24 hours) at 07/19/15 0935 Last data filed at 07/19/15 0700  Gross per 24 hour  Intake    483 ml  Output   1900 ml  Net  -1417 ml    Exam:  General: NAD  Resp: CTAB  Cardiac: RRR, no m/r/g, no jvd  GI: abdomen soft, NT, ND  MSK: no LE edema  Neuro: no focal deficits  Psych: appropriate affect  Lab Results:  Basic Metabolic Panel:  Recent Labs Lab 07/17/15 1555 07/17/15 2104 07/18/15 0508 07/18/15 0903  NA 137  --  138  --   K 3.3*  --  3.4*  --   CL 101  --  102  --   CO2 26  --  26  --   GLUCOSE 98  --  93  --   BUN 13  --  12  --   CREATININE 0.71 0.72 0.79  --   CALCIUM 9.0  --  8.3*  --   MG  --   --   --  2.0    Liver Function Tests:  Recent Labs Lab 07/18/15 0508  AST 29  ALT 36  ALKPHOS 54  BILITOT 0.7  PROT 6.9  ALBUMIN 3.7    CBC:  Recent Labs Lab 07/17/15 1555 07/17/15 2104 07/18/15 0508  WBC 8.7 12.1* 11.4*  HGB 14.5 14.1 13.4  HCT 41.5 40.8 39.4  MCV 92.6 92.3 94.0  PLT 201 209 200    Cardiac Enzymes:  Recent Labs Lab 07/17/15 2104 07/18/15 0340 07/18/15 0903  TROPONINI 0.19* 0.10* 0.06*    BNP: No results for input(s): PROBNP in the last 8760 hours.  Coagulation: No results for input(s): INR in the last 168 hours.  ECG:   Medications:   Scheduled Medications: . aspirin EC  81 mg Oral Daily  . atorvastatin  40 mg Oral QHS  . cloNIDine  0.1 mg Oral Once  . FLUoxetine  60 mg Oral Daily  . gabapentin  600 mg Oral QHS  . hydrochlorothiazide  25 mg Oral Daily  . insulin  aspart  0-20 Units Subcutaneous TID WC  . insulin aspart  0-5 Units Subcutaneous QHS  . losartan  100 mg Oral Daily  . metFORMIN  500 mg Oral BID WC  . primidone  50 mg Oral QHS  . regadenoson      . regadenoson  0.4 mg Intravenous Once  . sodium chloride  3 mL Intravenous Q12H  . sodium chloride  3 mL Intravenous Q12H  . sodium chloride      . vitamin E  400 Units Oral Daily     Infusions:     PRN Medications:  clonazePAM, diclofenac, docusate sodium, fluticasone, HYDROcodone-acetaminophen, loratadine, ondansetron **OR** ondansetron (ZOFRAN) IV, technetium sestamibi generic     Assessment/Plan   1. Elevated troponin - presented with episode of severe occipital headache and severe HTN, mild troponin elevation without ischemic changes by EKG - echo with normal LVEF, no WMAs - Lexiscan MPI  pending, will f/u results. If negative no further cardiac testing is planned.         Carlyle Dolly, M.D.

## 2015-07-19 NOTE — Progress Notes (Signed)
[facility] PROGRESS NOTE  Sarah Cooley BDZ:[id number] DOB: [birth year] DOA: 07/17/2015 PCP: Delia Chimes, NP  Summary  49 yof with complaints of occipital headaches and elevated troponin. Her headaches which are possibly related to hypertensive urgency has improved but not resolved even with normalization of her BP. Neurology evaluated the patient and recommended lumbar puncture to rule out subarachnoid hemorrhage. Await further input from neurology regarding workup of headache. Cardiology evaluated the patient for elevated troponin with echocardiogram and stress testing that were found to be lower studies. No further cardiac testing is planned. Anticipate discharge home in the next 24 hours if no further neurologic workup is recommended.  Assessment/Plan: 1. Occipital headache, possibly related to hypertensive urgency vs cervicogenic headache. CT head was negative. Neurology consulted and recommended lumbar puncture to rule out subarachnoid hemorrhage. Patient underwent lumbar puncture today and was also been unrevealing. Await further input from neurology.   2. Elevated troponin. She denies any CP or SOB.  She was seen by cardiology who performed stress testing that was found to be a low risk study. Echocardiogram showed normal LVEF and no wall motion abnormalities. Suspect that her elevated troponin is from a demand ischemia. No further cardiac testing is planned. 3. Hypertensive urgency, she reports that her Atenolol was discontinued as an outpatient due to symptomatic bradycardia. She is currently on outpatient doses of losartan and hydrochlorothiazide with stable blood pressures. 4. DM, continue Metformin and SSI.   5. Obesity.   6. HLD. Continue statin.   Code Status: Full DVT Prophylaxis:Lovenox Family Communication: Family at bedside. Discussed with patient who understands and has no concerns at this time. Disposition Plan: Anticipate discharge within the next 24 hours.      Consultants:  Neurology  Cardiology  Procedures:  Lumbar puncture 9/27   Antibiotics:     HPI/Subjective: Still has some occipital headache, although not as bad as it was on admission. No chest pain or shortness of breath   Objective: Filed Vitals:   07/19/15 0530  BP: 115/70  Pulse: 55  Temp: 99.3 F (37.4 C)  Resp: 18    Intake/Output Summary (Last 24 hours) at 07/19/15 0734 Last data filed at 07/18/15 1900  Gross per 24 hour  Intake    723 ml  Output   1900 ml  Net  -1177 ml   Filed Weights   07/17/15 1435 07/17/15 2232  Weight: 103.42 kg (228 lb) 102.83 kg (226 lb 11.2 oz)    Exam:  General: NAD, looks comfortable Cardiovascular: RRR, S1, S2  Respiratory: clear bilaterally, No wheezing, rales or rhonchi Abdomen: soft, non tender, no distention , bowel sounds normal Musculoskeletal: No edema b/l  Data Reviewed: Basic Metabolic Panel:  Recent Labs Lab 07/17/15 1555 07/17/15 2104 07/18/15 0508 07/18/15 0903  NA 137  --  138  --   K 3.3*  --  3.4*  --   CL 101  --  102  --   CO2 26  --  26  --   GLUCOSE 98  --  93  --   BUN 13  --  12  --   CREATININE 0.71 0.72 0.79  --   CALCIUM 9.0  --  8.3*  --   MG  --   --   --  2.0   Liver Function Tests:  Recent Labs Lab 07/18/15 0508  AST 29  ALT 36  ALKPHOS 54  BILITOT 0.7  PROT 6.9  ALBUMIN 3.7   CBC:  Recent Labs Lab  07/17/15 1555 07/17/15 2104 07/18/15 0508  WBC 8.7 12.1* 11.4*  NEUTROABS 6.2  --   --   HGB 14.5 14.1 13.4  HCT 41.5 40.8 39.4  MCV 92.6 92.3 94.0  PLT 201 209 200   Cardiac Enzymes:  Recent Labs Lab 07/17/15 1839 07/17/15 2104 07/18/15 0340 07/18/15 0903  TROPONINI 0.15* 0.19* 0.10* 0.06*   CBG:  Recent Labs Lab 07/18/15 0718 07/18/15 1134 07/18/15 1626 07/18/15 2133 07/19/15 0728  GLUCAP 105* 91 88 86 81     Studies: Dg Chest 2 View  07/18/2015   CLINICAL DATA:  Headache, chest pain.  EXAM: CHEST  2 VIEW  COMPARISON:  08/24/2005   FINDINGS: The heart size and mediastinal contours are within normal limits. Both lungs are clear. The visualized skeletal structures are unremarkable.  IMPRESSION: No active cardiopulmonary disease.   Electronically Signed   By: Rolm Baptise M.D.   On: 07/18/2015 15:14   Ct Head Wo Contrast  07/17/2015   CLINICAL DATA:  49 year old female with sudden onset of stabbing pain in the back of the head 30 minutes prior to admission.  EXAM: CT HEAD WITHOUT CONTRAST  TECHNIQUE: Contiguous axial images were obtained from the base of the skull through the vertex without intravenous contrast.  COMPARISON:  No priors.  FINDINGS: No acute intracranial abnormalities. Specifically, no evidence of acute intracranial hemorrhage, no definite findings of acute/subacute cerebral ischemia, no mass, mass effect, hydrocephalus or abnormal intra or extra-axial fluid collections. Visualized paranasal sinuses and mastoids are well pneumatized. No acute displaced skull fractures are identified.  IMPRESSION: 1. No acute intracranial abnormalities. 2. The appearance of the brain is normal.   Electronically Signed   By: Vinnie Langton M.D.   On: 07/17/2015 16:56    Scheduled Meds: . aspirin EC  81 mg Oral Daily  . atorvastatin  40 mg Oral QHS  . cloNIDine  0.1 mg Oral Once  . FLUoxetine  60 mg Oral Daily  . gabapentin  600 mg Oral QHS  . hydrochlorothiazide  25 mg Oral Daily  . insulin aspart  0-20 Units Subcutaneous TID WC  . insulin aspart  0-5 Units Subcutaneous QHS  . losartan  100 mg Oral Daily  . metFORMIN  500 mg Oral BID WC  . primidone  50 mg Oral QHS  . regadenoson  0.4 mg Intravenous Once  . sodium chloride  3 mL Intravenous Q12H  . sodium chloride  3 mL Intravenous Q12H  . vitamin E  400 Units Oral Daily   Continuous Infusions:   Active Problems:   Headache   Elevated troponin   Hypertension   Obesity   Time spent: 76minutes   Jehanzeb Memon. MD  [facility] Pager [phone]. If 7PM-7AM,  please contact night-coverage at [email or web address], password [facility] 07/19/2015, 7:34 AM  LOS: 2 days

## 2015-07-20 DIAGNOSIS — R51 Headache: Secondary | ICD-10-CM | POA: Diagnosis not present

## 2015-07-20 DIAGNOSIS — R7989 Other specified abnormal findings of blood chemistry: Secondary | ICD-10-CM | POA: Diagnosis not present

## 2015-07-20 LAB — GLUCOSE, CAPILLARY
GLUCOSE-CAPILLARY: 89 mg/dL (ref 65–99)
GLUCOSE-CAPILLARY: 78 mg/dL (ref 65–99)

## 2015-07-20 LAB — VDRL, CSF: SYPHILIS VDRL QUANT CSF: NONREACTIVE

## 2015-07-20 MED ORDER — GABAPENTIN 600 MG PO TABS: 600.0000 mg | ORAL_TABLET | Freq: Every day | ORAL | Status: DC

## 2015-07-20 NOTE — Progress Notes (Signed)
PROGRESS NOTE  Sarah Cooley [id number]:[id number] DOB: 04/17/1966 DOA: 07/17/2015 PCP: Delia Chimes, NP  Summary: 49 y.o. female with h/o DM type 2, HTN, HLD presented to the ED with severe headache and chest numbness. While in the ED, patient was moderately hypertensive and labs revealed elevated troponin at 0.06 and slightly low potassium. CT head was unremarkable. She was admitted for further investigation and management.  Assessment/Plan: 1. Severe occipital headache superimposed on chronic HA, possibly secondary to HTN urgency. Much improved. CT head was negative. Neurology consulted and recommended lumbar puncture to rule out subarachnoid hemorrhage. Lumbar puncture normal. CSF culture pending. Discussed with Dr. Merlene Laughter today, agrees with d/c home, no further inpatient recs. Will f/u with Dr. Carles Collet. 2. Elevated troponin, suspected demand ischemia. Seen by cardiology who performed stress testing that was found to be a low risk study. Echocardiogram showed normal LVEF and no wall motion abnormalities.  3. Hypertensive urgency, now normotensive. She reports that her Atenolol was discontinued as an outpatient due to symptomatic bradycardia. She is currently on outpatient doses of losartan and hydrochlorothiazide. 4. DM. Stable. Continue Metformin and SSI.  5. Obesity.  6. HLD. Continue statin. 7. Stress, anxiety   Overall improved--no chest pain. HA nearly resolved and w/u negative.  Code Status: Full DVT Prophylaxis:Lovenox Family Communication: Discussed with patient and husband who understands and has no concerns at this time. Disposition Plan: Anticipate discharge today   Murray Hodgkins, MD  [facility]  Pager [phone] If 7PM-7AM, please contact night-coverage at [email or web address], password [facility] 07/20/2015, 9:28 AM  LOS: 3 days   Consultants:  Neurology  Cardiology  Procedures:  ECHO - Left ventricle: The cavity size was normal. Wall thickness was normal.  Systolic function was normal. The estimated ejection fraction was in the range of 60% to 65%. Wall motion was normal; there were no regional wall motion abnormalities. Left ventricular diastolic function parameters were normal. - Aortic valve: Valve area (VTI): 2.67 cm^2. Valve area (Vmax): 3.29 cm^2. - Atrial septum: No defect or patent foramen ovale was identified. - Technically adequate study.  Lumbar Puncture -CSF unremarkable  Antibiotics:    HPI/Subjective: No chest pain or SOB. Ready to go home. Headache nearly resolved.  Objective: Filed Vitals:   07/19/15 0530 07/19/15 1313 07/19/15 2100 07/20/15 0457  BP: 115/70 123/79 105/64 107/53  Pulse: 55 64 64 58  Temp: 99.3 F (37.4 C) 98.5 F (36.9 C) 97.8 F (36.6 C) 98.1 F (36.7 C)  TempSrc: Oral Oral Oral Oral  Resp: 18 18 18 18   Height:      Weight:      SpO2: 95% 97% 96% 96%    Intake/Output Summary (Last 24 hours) at 07/20/15 0928 Last data filed at 07/19/15 1706  Gross per 24 hour  Intake    480 ml  Output    450 ml  Net     30 ml     Filed Weights   07/17/15 1435 07/17/15 2232  Weight: 103.42 kg (228 lb) 102.83 kg (226 lb 11.2 oz)    Exam: Afebrile, VSS General:  Appears comfortable, calm. Eyes: PERRL, normal lids, irises Cardiovascular: Regular rate and rhythm, no murmur, rub or gallop. No lower extremity edema. Telemetry: Sinus rhythm, no arrhythmias  Respiratory: Clear to auscultation bilaterally, no wheezes, rales or rhonchi. Normal respiratory effort. Abdomen: soft, ntnd Musculoskeletal: grossly normal tone bilateral upper and lower extremities  New data reviewed:  CSF with 3 RBC, VDRL non-reactive, other CSF studies unremarkable. Culture pending.   Pending  data:  CSF culture  Scheduled Meds: . aspirin EC  81 mg Oral Daily  . atorvastatin  40 mg Oral QHS  . cloNIDine  0.1 mg Oral Once  . FLUoxetine  60 mg Oral Daily  . gabapentin  600 mg Oral QHS  . hydrochlorothiazide   25 mg Oral Daily  . insulin aspart  0-20 Units Subcutaneous TID WC  . insulin aspart  0-5 Units Subcutaneous QHS  . losartan  100 mg Oral Daily  . metFORMIN  500 mg Oral BID WC  . primidone  50 mg Oral QHS  . sodium chloride  3 mL Intravenous Q12H  . sodium chloride  3 mL Intravenous Q12H  . vitamin E  400 Units Oral Daily   Continuous Infusions:   Active Problems:   Headache   Elevated troponin   Hypertension   Obesity   Pain in the chest   Cephalalgia    By signing my name below, I, Rhett Bannister attest that this documentation has been prepared under the direction and in the presence of Murray Hodgkins, M.D.   Electronically signed: Rhett Bannister  07/20/2015   I personally performed the services described in this documentation. All medical record entries made by the scribe were at my direction. I have reviewed the chart and agree that the record reflects my personal performance and is accurate and complete. Murray Hodgkins, MD

## 2015-07-20 NOTE — Progress Notes (Signed)
Discussed with the patient and her husband at bedside - all questioned fully answered. She will call me if any problems arise. Discharge instructions included medications, upcoming appointments, and heart healthy diet.    Fritz Pickerel, RN

## 2015-07-20 NOTE — Progress Notes (Signed)
Cardiac work up negative including echo and stress test. No further cardiac testing planned at this time, will sign off inpatient care.  Zandra Abts MD

## 2015-07-20 NOTE — Discharge Summary (Signed)
Physician Discharge Summary  Sarah Cooley [id number]:[id number] DOB: 11/22/1965 DOA: 07/17/2015  PCP: Delia Chimes, NP  Admit date: 07/17/2015 Discharge date: 07/20/2015  Recommendations for Outpatient Follow-up:  1. Continue to follow-up with outpatient neurologist.   Follow-up Information    Follow up with [facility], NP.   Specialty:  Nurse Practitioner   Why:  As needed   Contact information:   [address] [place] [address] [phone]      Discharge Diagnoses:  1. Elevated troponin 2. Hypertensive urgency 3. Occipital headache 4. DM, type 2 5. Obesity 6. HLD  Discharge Condition: Improved Disposition: Home  Diet recommendation: Heart healthy  Filed Weights   07/17/15 1435 07/17/15 2232  Weight: 103.42 kg (228 lb) 102.83 kg (226 lb 11.2 oz)    History of present illness:  49 y.o. female with h/o CAD, DM type 2, HTN, HLD presented to the ED with severe headache and chest numbness. Patient did not complain of chest pain or SOB. While in the ED, patient was moderately hypertensive and labs revealed elevated troponin at 0.06 and slightly low potassium. CT head was unremarkable. She was admitted for further investigation and management.  Hospital Course:  Elevated troponin improved, likely related to demand ischemia. She remained without chest pain or SOB throughout her admission. Cardioloy recommended echo which showed normal LVEF and no wall motion abnormalities. Hypertensive urgency has resolved with antihypertensives. Occipital headache, superimposed on chronic headaches possibly related to hypertensive urgency has improved. CT head was negative. Neurology recommended lumbar puncture to rule out subarachnoid hemorrhage which was unremarkable. CSF culture is still pending.   Individual issus listed as below: 1. Severe occipital headache superimposed on chronic HA, possibly secondary to HTN urgency. Much improved. CT head was negative. Neurology  consulted and recommended lumbar puncture to rule out subarachnoid hemorrhage. Lumbar puncture normal. CSF culture pending. Discussed with Dr. Merlene Laughter today, agrees with d/c home, no further inpatient recs. Will f/u with Dr. Carles Collet. 2. Elevated troponin, suspected demand ischemia. Seen by cardiology who performed stress testing that was found to be a low risk study. Echocardiogram showed normal LVEF and no wall motion abnormalities.  3. Hypertensive urgency, now normotensive. She reports that her Atenolol was discontinued as an outpatient due to symptomatic bradycardia. She is currently on outpatient doses of losartan and hydrochlorothiazide. 4. DM. Stable. Continue Metformin and SSI.  5. Obesity.  6. HLD. Continue statin. 7. Stress, anxiety  Consultants:  Neurology  Cardiology  Procedures:  ECHO - Left ventricle: The cavity size was normal. Wall thickness was normal. Systolic function was normal. The estimated ejection fraction was in the range of 60% to 65%. Wall motion was normal; there were no regional wall motion abnormalities. Left ventricular diastolic function parameters were normal. - Aortic valve: Valve area (VTI): 2.67 cm^2. Valve area (Vmax): 3.29 cm^2. - Atrial septum: No defect or patent foramen ovale was identified. - Technically adequate study.  Lumbar Puncture -CSF unremarkable  Antibiotics:  None  Discharge Instructions   Discharge Medication List as of 07/20/2015 11:40 AM    CONTINUE these medications which have CHANGED   Details  gabapentin (NEURONTIN) 600 MG tablet Take 1 tablet (600 mg total) by mouth at bedtime., Starting 07/20/2015, Until Discontinued, No Print      CONTINUE these medications which have NOT CHANGED   Details  atorvastatin (LIPITOR) 40 MG tablet Take 40 mg by mouth daily., Until Discontinued, Historical Med    clonazePAM (KLONOPIN) 1 MG tablet Take 1 mg by mouth 3 (  three) times daily as needed. For anxiety, Until  Discontinued, Historical Med    Coenzyme Q10 (CO Q 10) 10 MG CAPS Take 1 capsule by mouth daily., Until Discontinued, Historical Med    diclofenac (VOLTAREN) 75 MG EC tablet Take 75 mg by mouth 2 (two) times daily as needed for mild pain. , Until Discontinued, Historical Med    docusate sodium (COLACE) 100 MG capsule Take 100 mg by mouth daily as needed for mild constipation., Until Discontinued, Historical Med    FLUoxetine (PROZAC) 20 MG capsule Take 60 mg by mouth daily. , Until Discontinued, Historical Med    hydrochlorothiazide (HYDRODIURIL) 25 MG tablet Take 25 mg by mouth daily., Until Discontinued, Historical Med    HYDROcodone-acetaminophen (NORCO/VICODIN) 5-325 MG per tablet Take 0.5-1 tablets by mouth every 6 (six) hours as needed for moderate pain. , Starting 05/10/2015, Until Discontinued, Historical Med    ibuprofen (ADVIL,MOTRIN) 200 MG tablet Take 800 mg by mouth every 6 (six) hours as needed for mild pain., Until Discontinued, Historical Med    losartan (COZAAR) 100 MG tablet Take 100 mg by mouth daily., Until Discontinued, Historical Med    metFORMIN (GLUCOPHAGE) 500 MG tablet Take 500 mg by mouth 2 (two) times daily with a meal., Until Discontinued, Historical Med    primidone (MYSOLINE) 50 MG tablet Take 1 tablet (50 mg total) by mouth at bedtime., Starting 05/06/2015, Until Discontinued, Normal    triamcinolone (NASACORT ALLERGY 24HR) 55 MCG/ACT AERO nasal inhaler Place 2 sprays into the nose daily as needed (Allergies)., Until Discontinued, Historical Med    vitamin E 400 UNIT capsule Take 400 Units by mouth daily., Until Discontinued, Historical Med    cetirizine (ZYRTEC) 10 MG tablet Take 10 mg by mouth daily as needed for allergies. , Until Discontinued, Historical Med      STOP taking these medications     atenolol (TENORMIN) 50 MG tablet        Allergies  Allergen Reactions  . Shrimp [Shellfish Allergy] Anaphylaxis and Swelling  . Erythromycin Other (See  Comments)    GI- Upset   . Minocycline Hcl Hives  . Ramipril Other (See Comments)    Ramipril--Caused Pancreatitis  . Drixoral [Brompheniramine-Pseudoeph] Rash  . Sinutab [Chlorphen-Pseudoephed-Apap] Rash    The results of significant diagnostics from this hospitalization (including imaging, microbiology, ancillary and laboratory) are listed below for reference.    Significant Diagnostic Studies: Dg Chest 2 View  07/18/2015   CLINICAL DATA:  Headache, chest pain.  EXAM: CHEST  2 VIEW  COMPARISON:  08/24/2005  FINDINGS: The heart size and mediastinal contours are within normal limits. Both lungs are clear. The visualized skeletal structures are unremarkable.  IMPRESSION: No active cardiopulmonary disease.   Electronically Signed   By: Rolm Baptise M.D.   On: 07/18/2015 15:14   Ct Head Wo Contrast  07/17/2015   CLINICAL DATA:  49 year old female with sudden onset of stabbing pain in the back of the head 30 minutes prior to admission.  EXAM: CT HEAD WITHOUT CONTRAST  TECHNIQUE: Contiguous axial images were obtained from the base of the skull through the vertex without intravenous contrast.  COMPARISON:  No priors.  FINDINGS: No acute intracranial abnormalities. Specifically, no evidence of acute intracranial hemorrhage, no definite findings of acute/subacute cerebral ischemia, no mass, mass effect, hydrocephalus or abnormal intra or extra-axial fluid collections. Visualized paranasal sinuses and mastoids are well pneumatized. No acute displaced skull fractures are identified.  IMPRESSION: 1. No acute intracranial abnormalities. 2. The  appearance of the brain is normal.   Electronically Signed   By: Vinnie Langton M.D.   On: 07/17/2015 16:56   Nm Myocar Multi W/spect W/wall Motion / Ef  07/19/2015    There was no ST segment deviation noted during stress after Lexiscan  injection  The study is normal. There are no perfusion defects consistent with  ischemia or prior infarct.  This is a low risk  study.  Nuclear stress EF: 63%.    Dg Fluoro Guide Ndl Plcd/bx/inj/loc  07/19/2015   CLINICAL DATA:  New onset severe, imbalance, hand tremors, numbness, family history of lupus, stroke, and aneurysm  EXAM: DIAGNOSTIC LUMBAR PUNCTURE UNDER FLUOROSCOPIC GUIDANCE  FLUOROSCOPY TIME:  Radiation Exposure Index (as provided by the fluoroscopic device): Not provided  If the device does not provide the exposure index:  Fluoroscopy Time (in minutes and seconds):  0 minutes 10 seconds  Number of Acquired Images:  1 screen capture during fluoroscopy  PROCEDURE: Procedure, benefits, and risks were discussed with the patient, including alternatives.  Patient's questions were answered.  Written informed consent was obtained.  Timeout protocol followed.  Patient placed prone.  L4-L5 disc space was localized under fluoroscopy.  Skin prepped and draped in usual sterile fashion.  Skin and soft tissues anesthetized with 1.5 mL of 1% lidocaine.  22 gauge needle was advanced into the spinal canal where clear colorless CSF was encountered with an opening pressure of 6 cm H2O (prone).  11 mL of CSF was obtained in 4 tubes for requested analysis.  Procedure tolerated very well by patient without immediate complication.  IMPRESSION: Fluoroscopic guided lumbar puncture as above.   Electronically Signed   By: Lavonia Dana M.D.   On: 07/19/2015 11:51    Microbiology: Recent Results (from the past 240 hour(s))  CSF culture     Status: None (Preliminary result)   Collection Time: 07/19/15 11:15 AM  Result Value Ref Range Status   Specimen Description CSF  Final   Special Requests NONE  Final   Gram Stain   Final    CYTOSPIN SMEAR NO WBC SEEN NO ORGANISMS SEEN Performed at [facility]    Culture   Final    NO GROWTH < 24 HOURS Performed at [facility]    Report Status PENDING  Incomplete     Labs: Basic Metabolic Panel:  Recent Labs Lab 07/17/15 1555 07/17/15 2104 07/18/15 0508 07/18/15 0903  NA  137  --  138  --   K 3.3*  --  3.4*  --   CL 101  --  102  --   CO2 26  --  26  --   GLUCOSE 98  --  93  --   BUN 13  --  12  --   CREATININE 0.71 0.72 0.79  --   CALCIUM 9.0  --  8.3*  --   MG  --   --   --  2.0   Liver Function Tests:  Recent Labs Lab 07/18/15 0508  AST 29  ALT 36  ALKPHOS 54  BILITOT 0.7  PROT 6.9  ALBUMIN 3.7   CBC:  Recent Labs Lab 07/17/15 1555 07/17/15 2104 07/18/15 0508  WBC 8.7 12.1* 11.4*  NEUTROABS 6.2  --   --   HGB 14.5 14.1 13.4  HCT 41.5 40.8 39.4  MCV 92.6 92.3 94.0  PLT 201 209 200   Cardiac Enzymes:  Recent Labs Lab 07/17/15 1839 07/17/15 2104 07/18/15 0340 07/18/15  4854  TROPONINI 0.15* 0.19* 0.10* 0.06*     CBG:  Recent Labs Lab 07/19/15 0728 07/19/15 1130 07/19/15 1615 07/19/15 2110 07/20/15 0756  GLUCAP 81 109* 106* 95 89    Principal Problem:   Elevated troponin Active Problems:   Headache   Hypertension   Obesity   Pain in the chest   Cephalalgia est   Cephalalgia   Time coordinating discharge: 35 minutes  Signed:  Murray Hodgkins, MD [facility] 07/20/2015, 11:18 AM  By signing my name below, I, Rhett Bannister attest that this documentation has been prepared under the direction and in the presence of Murray Hodgkins, M.D.   Electronically signed: Rhett Bannister  07/20/2015   I personally performed the services described in this documentation. All medical record entries made by the scribe were at my direction. I have reviewed the chart and agree that the record reflects my personal performance and is accurate and complete. Murray Hodgkins, MD

## 2015-07-22 LAB — CSF CULTURE W GRAM STAIN

## 2015-07-22 LAB — CSF CULTURE: CULTURE: NO GROWTH

## 2015-08-08 ENCOUNTER — Other Ambulatory Visit: Payer: Self-pay | Admitting: Nurse Practitioner

## 2015-08-08 DIAGNOSIS — N6489 Other specified disorders of breast: Secondary | ICD-10-CM

## 2015-08-19 ENCOUNTER — Other Ambulatory Visit: Payer: Self-pay | Admitting: Nurse Practitioner

## 2015-08-19 ENCOUNTER — Ambulatory Visit
Admission: RE | Admit: 2015-08-19 | Discharge: 2015-08-19 | Disposition: A | Discharge status: Home / Self Care | Payer: BC Managed Care – PPO | Source: Ambulatory Visit | Attending: Nurse Practitioner | Admitting: Nurse Practitioner

## 2015-08-19 DIAGNOSIS — N6489 Other specified disorders of breast: Secondary | ICD-10-CM

## 2015-08-23 ENCOUNTER — Ambulatory Visit
Admission: RE | Admit: 2015-08-23 | Discharge: 2015-08-23 | Disposition: A | Discharge status: Home / Self Care | Payer: BC Managed Care – PPO | Source: Ambulatory Visit | Attending: Nurse Practitioner | Admitting: Nurse Practitioner

## 2015-08-23 ENCOUNTER — Inpatient Hospital Stay: Admission: RE | Admit: 2015-08-23 | Payer: BC Managed Care – PPO | Source: Ambulatory Visit

## 2015-08-23 DIAGNOSIS — N6489 Other specified disorders of breast: Secondary | ICD-10-CM

## 2015-08-26 ENCOUNTER — Telehealth: Payer: Self-pay | Admitting: *Deleted

## 2015-08-26 DIAGNOSIS — C50311 Malignant neoplasm of lower-inner quadrant of right female breast: Secondary | ICD-10-CM | POA: Insufficient documentation

## 2015-08-26 HISTORY — DX: Malignant neoplasm of lower-inner quadrant of right female breast: C50.311

## 2015-08-26 NOTE — Telephone Encounter (Signed)
Confirmed [facility] for 08/31/15 at 1230pm .  Instructions and contact information given.

## 2015-08-31 ENCOUNTER — Ambulatory Visit (HOSPITAL_BASED_OUTPATIENT_CLINIC_OR_DEPARTMENT_OTHER): Payer: BC Managed Care – PPO | Admitting: Oncology

## 2015-08-31 ENCOUNTER — Ambulatory Visit: Payer: BC Managed Care – PPO | Attending: Surgery | Admitting: Physical Therapy

## 2015-08-31 ENCOUNTER — Ambulatory Visit
Admission: RE | Admit: 2015-08-31 | Discharge: 2015-08-31 | Disposition: A | Discharge status: Home / Self Care | Payer: BC Managed Care – PPO | Source: Ambulatory Visit | Attending: Radiation Oncology | Admitting: Radiation Oncology

## 2015-08-31 ENCOUNTER — Other Ambulatory Visit: Payer: BC Managed Care – PPO

## 2015-08-31 ENCOUNTER — Encounter: Payer: Self-pay | Admitting: Physical Therapy

## 2015-08-31 ENCOUNTER — Encounter: Payer: Self-pay | Admitting: Genetic Counselor

## 2015-08-31 ENCOUNTER — Ambulatory Visit: Payer: BC Managed Care – PPO | Admitting: Genetic Counselor

## 2015-08-31 ENCOUNTER — Other Ambulatory Visit (HOSPITAL_BASED_OUTPATIENT_CLINIC_OR_DEPARTMENT_OTHER): Payer: BC Managed Care – PPO

## 2015-08-31 ENCOUNTER — Ambulatory Visit: Payer: Self-pay | Admitting: Surgery

## 2015-08-31 ENCOUNTER — Encounter: Payer: Self-pay | Admitting: Oncology

## 2015-08-31 VITALS — BP 137/84 | HR 99 | Temp 98.2°F | Resp 18 | Ht 68.0 in | Wt 230.3 lb

## 2015-08-31 DIAGNOSIS — Z803 Family history of malignant neoplasm of breast: Secondary | ICD-10-CM | POA: Insufficient documentation

## 2015-08-31 DIAGNOSIS — R293 Abnormal posture: Secondary | ICD-10-CM | POA: Insufficient documentation

## 2015-08-31 DIAGNOSIS — C50311 Malignant neoplasm of lower-inner quadrant of right female breast: Secondary | ICD-10-CM

## 2015-08-31 DIAGNOSIS — D0511 Intraductal carcinoma in situ of right breast: Secondary | ICD-10-CM

## 2015-08-31 DIAGNOSIS — Z9181 History of falling: Secondary | ICD-10-CM | POA: Diagnosis present

## 2015-08-31 DIAGNOSIS — Z171 Estrogen receptor negative status [ER-]: Secondary | ICD-10-CM

## 2015-08-31 DIAGNOSIS — Z800 Family history of malignant neoplasm of digestive organs: Secondary | ICD-10-CM

## 2015-08-31 LAB — CBC WITH DIFFERENTIAL/PLATELET
BASO%: 0.6 % (ref 0.0–2.0)
BASOS ABS: 0 10*3/uL (ref 0.0–0.1)
EOS%: 1.2 % (ref 0.0–7.0)
Eosinophils Absolute: 0.1 10*3/uL (ref 0.0–0.5)
HCT: 45.4 % (ref 34.8–46.6)
HGB: 15.5 g/dL (ref 11.6–15.9)
LYMPH%: 28.3 % (ref 14.0–49.7)
MCH: 32.5 pg (ref 25.1–34.0)
MCHC: 34.2 g/dL (ref 31.5–36.0)
MCV: 95 fL (ref 79.5–101.0)
MONO#: 0.5 10*3/uL (ref 0.1–0.9)
MONO%: 6.3 % (ref 0.0–14.0)
NEUT#: 5.1 10*3/uL (ref 1.5–6.5)
NEUT%: 63.6 % (ref 38.4–76.8)
Platelets: 253 10*3/uL (ref 145–400)
RBC: 4.78 10*6/uL (ref 3.70–5.45)
RDW: 13.5 % (ref 11.2–14.5)
WBC: 8.1 10*3/uL (ref 3.9–10.3)
lymph#: 2.3 10*3/uL (ref 0.9–3.3)

## 2015-08-31 LAB — COMPREHENSIVE METABOLIC PANEL (CC13)
ALT: 37 U/L (ref 0–55)
AST: 24 U/L (ref 5–34)
Albumin: 3.9 g/dL (ref 3.5–5.0)
Alkaline Phosphatase: 70 U/L (ref 40–150)
Anion Gap: 14 mEq/L — ABNORMAL HIGH (ref 3–11)
BUN: 10.3 mg/dL (ref 7.0–26.0)
CHLORIDE: 97 meq/L — AB (ref 98–109)
CO2: 27 mEq/L (ref 22–29)
Calcium: 9.7 mg/dL (ref 8.4–10.4)
Creatinine: 0.8 mg/dL (ref 0.6–1.1)
EGFR: 84 mL/min/{1.73_m2} — AB (ref >90)
GLUCOSE: 114 mg/dL (ref 70–140)
POTASSIUM: 2.6 meq/L — AB (ref 3.5–5.1)
SODIUM: 138 meq/L (ref 136–145)
Total Bilirubin: 0.64 mg/dL (ref 0.20–1.20)
Total Protein: 7.8 g/dL (ref 6.4–8.3)

## 2015-08-31 MED ORDER — POTASSIUM CHLORIDE CRYS ER 20 MEQ PO TBCR: 20.0000 meq | EXTENDED_RELEASE_TABLET | Freq: Two times a day (BID) | ORAL | Status: DC

## 2015-08-31 NOTE — Progress Notes (Signed)
[facility]  [phone] [phone]     ID: Sarah Cooley DOB: 02/07/1966  MR#: [id number]  CSN#:[id number]  Patient Care Team: Susan Fuller, NP as PCP - General (Nurse Practitioner) Gustav C Magrinat, MD as Consulting Physician (Oncology) Thomas [name], MD as Consulting Physician (General Surgery) Sarah Squire, MD as Attending Physician (Radiation Oncology) PCP: FULLER,SUSAN, NP OTHER MD: Shannon Penland M.D., Jonathan Branch M.D., Rebecca Tat MLD  CHIEF COMPLAINT: Ductal carcinoma in situ, estrogen receptor negative  CURRENT TREATMENT: Awaiting genetics testing   BREAST CANCER HISTORY: Chela had bilateral screening mammography 11/02/2014 at the [facility] Center showing a possible area of asymmetry in the right breast. Right diagnostic mammography and ultrasonography genera 20 03/11/2015 found the breast density to be had a gray B. The area in the superior right breast noticed on screening mammography did not persist but there was another asymmetry in the medial right breast posteriorly which appeared more significant. This was not palpable. Ultrasound of this area was negative.  Stereotactic biopsy of the right breast area in question was attempted 11/25/2014 but with age in no definitive sonographic abnormality noted, and the area of asymmetry appearing less conspicuous, 6 month follow-up was suggested. This was performed 08/19/2015. Diagnostic right mammography with tomosynthesis and right breast ultrasonography on that date again found the area of asymmetry in the lower inner quadrant of the right breast, now more masslike in appearance (until tomosynthesis. Ultrasound again was unhelpful.  Stereotactic biopsy of the right breast mass in question was performed 08/23/2015 and showed ([id number]) ductal carcinoma in situ, high-grade, possibly involving a papilloma, with areas suspicious for microinvasion. The cells were estrogen and progesterone  receptor negative.  The patient's subsequent history is as detailed below  INTERVAL HISTORY: Sarah Cooley was evaluated in the breast cancer multidisciplinary clinic 08/31/2015 accompanied by her husband [name] and her sister Donna. Her case was also presented in the multidisciplinary breast cancer conference that same morning. At that time a preliminary plan was proposed: Genetics referral, breast conserving surgery including sentinel lymph node sampling and adjuvant radiation  REVIEW OF SYSTEMS: The patient has a diffusely positive review of systems, including insomnia, fatigue, pain in the back, hips, legs, feet, neck, and shoulders. The pain as throbbing. She has changes in her vision. She has ringing in her ears per she has a runny nose. She has a poor appetite and frequent problems with nausea although no vomiting. She has reflux symptoms. She has abdominal pain. She bruises easily. She has frequent headaches. She feels numb at times. She is anxious and depressed. A detailed review of systems was otherwise noncontributory.   PAST MEDICAL HISTORY: Past Medical History  Diagnosis Date  . Fibromyalgia   . Depression   . OSA (obstructive sleep apnea)   . Hypertension   . Hyperlipidemia   . Diabetes mellitus without complication (HCC)   . Breast cancer (HCC)   . Fibromyalgia   . Arthritis   . Hyperlipemia     PAST SURGICAL HISTORY: Past Surgical History  Procedure Laterality Date  . Bone graft hip iliac crest    . Abdominal hysterectomy    . Bladder surgery    . Knee arthroscopy w/ acl reconstruction      right  . Cholecystectomy      FAMILY HISTORY Family History  Problem Relation Age of Onset  . Hypertension Mother   . Lupus Sister   . Heart disease Maternal Aunt   . Lung cancer Paternal Aunt   .   Breast cancer Paternal Aunt   . Lung cancer Paternal Grandfather   . Colon cancer Paternal Uncle   . Colon cancer Paternal Uncle   The patient's parents are living, in their mid 70s.  The patient has one brother, and one sister. On the paternal side, the patient's father had melanoma diagnosed in his 60s. The paternal grandfather, who died at the age of 99, had a history of lung and colon cancer. There are 2 uncles with colon cancer and one with lung cancer as well as one aunt on the paternal side with breast cancer diagnosed in her late 60s. The patient's only sister underwent lumpectomy at the age of 32, but the patient does not know the exact results.  GYNECOLOGIC HISTORY:  No LMP recorded. Patient has had a hysterectomy. Menarche age 12, first live birth at 25. The patient is GX P2. She underwent partial hysterectomy, without salpingo-oophorectomy, remotely. She did not take hormone replacement. 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 The patient's husband, [name], is a corrections officer. The patient has 2 children from a prior marriage, Brennon Hurat who lives in [place] and is disabled secondary to bipolar disorder; and Alyssa Hurst, who lives in [place] and works as a pharmacy assistant and equal treat him. The patient has 2 grandchildren and one on the way. [name] has a daughter from an earlier marriage Kristen who works as an RN in [place]. Verenice attends a local [employer].    ADVANCED DIRECTIVES: Not in place   HEALTH MAINTENANCE: Social History  Substance Use Topics  . Smoking status: Never Smoker   . Smokeless tobacco: None  . Alcohol Use: 0.0 oz/week    0 Standard drinks or equivalent per week     Comment: 2-3 times a year     Colonoscopy:Never  PAP:Status post hysterectomy  Bone density:Never  Lipid panel:  Allergies  Allergen Reactions  . Shrimp [Shellfish Allergy] Anaphylaxis and Swelling  . Erythromycin Other (See Comments)    GI- Upset   . Minocycline Hcl Hives  . Ramipril  Other (See Comments)    Ramipril--Caused Pancreatitis  . Drixoral [Brompheniramine-Pseudoeph] Rash  . Sinutab [Chlorphen-Pseudoephed-Apap] Rash    Current Outpatient Prescriptions  Medication Sig Dispense Refill  . atorvastatin (LIPITOR) 40 MG tablet Take 40 mg by mouth daily.    . cetirizine (ZYRTEC) 10 MG tablet Take 10 mg by mouth daily as needed for allergies.     . clonazePAM (KLONOPIN) 1 MG tablet Take 1 mg by mouth 3 (three) times daily as needed. For anxiety    . diclofenac (VOLTAREN) 75 MG EC tablet Take 75 mg by mouth 2 (two) times daily as needed for mild pain.     . docusate sodium (COLACE) 100 MG capsule Take 100 mg by mouth daily as needed for mild constipation.    . fexofenadine (ALLEGRA) 30 MG tablet Take 30 mg by mouth as needed.    . FLUoxetine (PROZAC) 20 MG capsule Take 60 mg by mouth daily.     . gabapentin (NEURONTIN) 600 MG tablet Take 1 tablet (600 mg total) by mouth at bedtime.    . hydrochlorothiazide (HYDRODIURIL) 25 MG tablet Take 25 mg by mouth daily.    . HYDROcodone-acetaminophen (NORCO/VICODIN) 5-325 MG per tablet Take 0.5-1 tablets by mouth every 6 (six) hours as needed for moderate   pain.     . ibuprofen (ADVIL,MOTRIN) 200 MG tablet Take 800 mg by mouth every 6 (six) hours as needed for mild pain.    . losartan (COZAAR) 100 MG tablet Take 100 mg by mouth daily.    . metFORMIN (GLUCOPHAGE) 500 MG tablet Take 500 mg by mouth 2 (two) times daily with a meal.    . primidone (MYSOLINE) 50 MG tablet Take 1 tablet (50 mg total) by mouth at bedtime. 30 tablet 3  . Vitamin D, Ergocalciferol, (DRISDOL) 50000 UNITS CAPS capsule Take 50,000 Units by mouth 2 (two) times a week.    . vitamin E 400 UNIT capsule Take 400 Units by mouth daily.    . zolpidem (AMBIEN) 10 MG tablet Take 10 mg by mouth at bedtime.    . Coenzyme Q10 (CO Q 10) 10 MG CAPS Take 1 capsule by mouth daily.    . triamcinolone (NASACORT ALLERGY 24HR) 55 MCG/ACT AERO nasal inhaler Place 2 sprays into the  nose daily as needed (Allergies).     No current facility-administered medications for this visit.    OBJECTIVE: Middle-aged white woman who appears stated age  Filed Vitals:   08/31/15 1229  BP: 137/84  Pulse: 99  Temp: 98.2 F (36.8 C)  Resp: 18     Body mass index is 35.02 kg/(m^2).    ECOG FS:1 - Symptomatic but completely ambulatory  Ocular: Sclerae unicteric, pupils equal, round and reactive to light Ear-nose-throat: Oropharynx clear and moist Lymphatic: No cervical or supraclavicular adenopathy Lungs no rales or rhonchi, good excursion bilaterally Heart regular rate and rhythm, no murmur appreciated Abd soft, nontender, positive bowel sounds MSK no focal spinal tenderness, no joint edema Neuro: non-focal, well-oriented, appropriate affect Breasts: The right breast is status post recent biopsy. I do not palpate and mass. There are no skin or nipple changes of concern. The right axilla is benign. The left breast is unremarkable.   LAB RESULTS:  CMP     Component Value Date/Time   NA 138 08/31/2015 1217   NA 138 07/18/2015 0508   K 2.6* 08/31/2015 1217   K 3.4* 07/18/2015 0508   CL 102 07/18/2015 0508   CO2 27 08/31/2015 1217   CO2 26 07/18/2015 0508   GLUCOSE 114 08/31/2015 1217   GLUCOSE 93 07/18/2015 0508   BUN 10.3 08/31/2015 1217   BUN 12 07/18/2015 0508   CREATININE 0.8 08/31/2015 1217   CREATININE 0.79 07/18/2015 0508   CALCIUM 9.7 08/31/2015 1217   CALCIUM 8.3* 07/18/2015 0508   PROT 7.8 08/31/2015 1217   PROT 6.9 07/18/2015 0508   ALBUMIN 3.9 08/31/2015 1217   ALBUMIN 3.7 07/18/2015 0508   AST 24 08/31/2015 1217   AST 29 07/18/2015 0508   ALT 37 08/31/2015 1217   ALT 36 07/18/2015 0508   ALKPHOS 70 08/31/2015 1217   ALKPHOS 54 07/18/2015 0508   BILITOT 0.64 08/31/2015 1217   BILITOT 0.7 07/18/2015 0508   GFRNONAA >60 07/18/2015 0508   GFRAA >60 07/18/2015 0508    INo results found for: SPEP, UPEP  Lab Results  Component Value Date   WBC  8.1 08/31/2015   NEUTROABS 5.1 08/31/2015   HGB 15.5 08/31/2015   HCT 45.4 08/31/2015   MCV 95.0 08/31/2015   PLT 253 08/31/2015      Chemistry      Component Value Date/Time   NA 138 08/31/2015 1217   NA 138 07/18/2015 0508   K 2.6* 08/31/2015 1217   K   3.4* 07/18/2015 0508   CL 102 07/18/2015 0508   CO2 27 08/31/2015 1217   CO2 26 07/18/2015 0508   BUN 10.3 08/31/2015 1217   BUN 12 07/18/2015 0508   CREATININE 0.8 08/31/2015 1217   CREATININE 0.79 07/18/2015 0508      Component Value Date/Time   CALCIUM 9.7 08/31/2015 1217   CALCIUM 8.3* 07/18/2015 0508   ALKPHOS 70 08/31/2015 1217   ALKPHOS 54 07/18/2015 0508   AST 24 08/31/2015 1217   AST 29 07/18/2015 0508   ALT 37 08/31/2015 1217   ALT 36 07/18/2015 0508   BILITOT 0.64 08/31/2015 1217   BILITOT 0.7 07/18/2015 0508       No results found for: LABCA2  No components found for: LABCA125  No results for input(s): INR in the last 168 hours.  Urinalysis No results found for: COLORURINE, APPEARANCEUR, LABSPEC, PHURINE, GLUCOSEU, HGBUR, BILIRUBINUR, KETONESUR, PROTEINUR, UROBILINOGEN, NITRITE, LEUKOCYTESUR  STUDIES: US Breast Ltd Uni Right Inc Axilla  08/19/2015  CLINICAL DATA:  Six-month follow-up for probably benign asymmetry within the lower inner quadrant of the right breast. A stereotactic biopsy was attempted on 11/25/2014 but the asymmetry could not be reproduced at that time. EXAM: DIGITAL DIAGNOSTIC RIGHT MAMMOGRAM WITH 3D TOMOSYNTHESIS WITH CAD ULTRASOUND RIGHT BREAST COMPARISON:  Previous exams including diagnostic mammogram of 11/15/2014. ACR Breast Density Category b: There are scattered areas of fibroglandular density. FINDINGS: Bilateral CC and MLO views were obtained with tomosynthesis. With the benefit of tomosynthesis, the asymmetry is again identified within the lower inner quadrant of the right breast, at posterior depth, now masslike in appearance on tomosynthesis images and best seen on the  tomosynthesis CC slice 17 and MLO slice 43. There are several new punctate calcifications within this asymmetry. No new dominant masses, suspicious calcifications or secondary signs of malignancy within the remainder of the right breast. Mammographic images were processed with CAD. Targeted ultrasound is performed, showing no sonographic correlate for the right breast asymmetry. No suspicious solid or cystic mass is identified within the lower inner quadrant of the right breast. IMPRESSION: Masslike asymmetry within the lower inner quadrant of the right breast, at posterior depth, with associated microcalcifications which are new. No sonographic correlate. Recommend stereotactic biopsy with tomosynthesis guidance. RECOMMENDATION: Stereotactic-guided biopsy, with tomosynthesis guidance, for the masslike asymmetry in the lower inner quadrant of the right breast with associated microcalcifications. Stereotactic guided biopsy is scheduled for Tuesday, November 1st at 9 a.m. I have discussed the findings and recommendations with the patient. Results were also provided in writing at the conclusion of the visit. If applicable, a reminder letter will be sent to the patient regarding the next appointment. BI-RADS CATEGORY  4: Suspicious. Electronically Signed   By: Franki Cabot M.D.   On: 08/19/2015 11:14   Mm Diag Breast Tomo Uni Right  08/23/2015  CLINICAL DATA:  Stereotactic biopsy of the right breast at tomographic technique was performed of an asymmetry in the lower inner quadrant, with associated calcifications. EXAM: 3D DIAGNOSTIC RIGHT MAMMOGRAM POST STEREOTACTIC BIOPSY COMPARISON:  Previous exam(s). FINDINGS: 3D Mammographic images were obtained following stereotactic guided biopsy of a focal asymmetry in the lower inner quadrant of the right breast, with associated calcifications. An X shaped biopsy clip is 1.0 cm directly medial to the asymmetry and biopsy changes. On the 90 degrees lateral view, the biopsy  clip is correctly positioned in the superior to inferior plane. IMPRESSION: X shaped biopsy clip 1.0 cm directly medial to the biopsy site. Final Assessment:  Post Procedure Mammograms for Marker Placement Electronically Signed   By: Susan  Turner M.D.   On: 08/23/2015 10:18   Mm Diag Breast Tomo Uni Right  08/19/2015  CLINICAL DATA:  Six-month follow-up for probably benign asymmetry within the lower inner quadrant of the right breast. A stereotactic biopsy was attempted on 11/25/2014 but the asymmetry could not be reproduced at that time. EXAM: DIGITAL DIAGNOSTIC RIGHT MAMMOGRAM WITH 3D TOMOSYNTHESIS WITH CAD ULTRASOUND RIGHT BREAST COMPARISON:  Previous exams including diagnostic mammogram of 11/15/2014. ACR Breast Density Category b: There are scattered areas of fibroglandular density. FINDINGS: Bilateral CC and MLO views were obtained with tomosynthesis. With the benefit of tomosynthesis, the asymmetry is again identified within the lower inner quadrant of the right breast, at posterior depth, now masslike in appearance on tomosynthesis images and best seen on the tomosynthesis CC slice 17 and MLO slice 43. There are several new punctate calcifications within this asymmetry. No new dominant masses, suspicious calcifications or secondary signs of malignancy within the remainder of the right breast. Mammographic images were processed with CAD. Targeted ultrasound is performed, showing no sonographic correlate for the right breast asymmetry. No suspicious solid or cystic mass is identified within the lower inner quadrant of the right breast. IMPRESSION: Masslike asymmetry within the lower inner quadrant of the right breast, at posterior depth, with associated microcalcifications which are new. No sonographic correlate. Recommend stereotactic biopsy with tomosynthesis guidance. RECOMMENDATION: Stereotactic-guided biopsy, with tomosynthesis guidance, for the masslike asymmetry in the lower inner quadrant of the  right breast with associated microcalcifications. Stereotactic guided biopsy is scheduled for Tuesday, November 1st at 9 a.m. I have discussed the findings and recommendations with the patient. Results were also provided in writing at the conclusion of the visit. If applicable, a reminder letter will be sent to the patient regarding the next appointment. BI-RADS CATEGORY  4: Suspicious. Electronically Signed   By: Stan  Maynard M.D.   On: 08/19/2015 11:14   Mm Rt Breast Bx W Loc Dev 1st Lesion Image Bx Spec Stereo Guide  08/25/2015  ADDENDUM REPORT: 08/25/2015 13:18 ADDENDUM: Pathology reveals at least high grade (III) ductal carcinoma in situ of the lower inner quadrant of the Right breast. This was found to be concordant by Dr. Susan Turner. Pathology results were discussed with the patient via telephone. She reported tenderness at the biopsy site and is doing well otherwise. Post biopsy care and instructions were reviewed and questions were answered. The patient was encouraged to call [facility] with any additional questions and or concerns. The patient was referred to [facility] on August 31, 2015. Pathology results reported by Lynne Bailey RN on August 25, 2015. Electronically Signed   By: Susan  Turner M.D.   On: 08/25/2015 13:18  08/25/2015  CLINICAL DATA:  3D stereotactic biopsy of the right breast was recommended, for sampling of an asymmetry in the medial right breast. Asymmetry contains a few calcifications. EXAM: RIGHT BREAST STEREOTACTIC CORE NEEDLE BIOPSY COMPARISON:  Previous exams. FINDINGS: The patient and I discussed the procedure of stereotactic-guided biopsy including benefits and alternatives. We discussed the high likelihood of a successful procedure. We discussed the risks of the procedure including infection, bleeding, tissue injury, clip migration, and inadequate sampling. Informed  written consent was given. The usual time out protocol was performed immediately prior to the procedure. Using sterile technique and 2% Lidocaine as local anesthetic, under stereotactic guidance, a   9 gauge vacuum assisted device was used to perform core needle biopsy of asymmetry with associated calcifications in the lower inner quadrant of the right breast using a medial to lateral approach. Specimen radiograph was performed showing several calcifications. Specimens with calcifications are identified for pathology. At the conclusion of the procedure, a X shaped tissue marker clip was deployed into the biopsy cavity. Follow-up 2-view mammogram was performed and dictated separately. IMPRESSION: Stereotactic-guided biopsy of the right breast. No apparent complications. Electronically Signed: By: Susan  Turner M.D. On: 08/23/2015 10:13    ASSESSMENT: 49 y.o. [facility] woman status post right breast lower outer quadrant biopsy 08/23/2015 for ductal carcinoma in situ, high-grade, estrogen and progesterone receptor negative  (1) genetics testing pending  (2) definitive surgery to be guided by genetics results  (3) adjuvant radiation to follow surgery as appropriate  PLAN: We spent the better part of today's hour-long appointment discussing the biology of breast cancer in general, and the specifics of the patient's tumor in particular. Aniylah understands that in noninvasive ductal carcinoma, also called ductal carcinoma in situ ("DCIS") the breast cancer cells remain trapped in the ducts were they started. They cannot travel to a vital organ. For that reason these cancers in themselves are not life-threatening.  If the whole breast is removed then all the ducts are removed and since the cancer cells are trapped in the ducts, the cure rate with mastectomy for noninvasive breast cancer is approximately 99%. Nevertheless we recommend lumpectomy, because there is no survival advantage to mastectomy and because  the cosmetic result is generally superior with breast conservation.  Because of her family history and age he she qualifies for genetics testing. In patients who carry a deleterious mutations such as for example BRCA, the risk of a new breast cancer developing may be sufficiently great that the patient may choose bilateral mastectomies. However if the patient wishes to keep her breasts in that situation it is safe to do so. That would require intensified screening, which generally means not only yearly mammography but a yearly breast MRI as well. Of course, if there is a deleterious mutation bilateral oophorectomy would be necessary as there is no standard screening protocol for ovarian cancer.  After much discussion Ronda feels very confident that if she carried a deleterious mutation she would want bilateral mastectomies with reconstruction. Accordingly we have to start with genetics testing and this is being scheduled as soon as possible. Once she has those results she can proceed to surgery with all the information necessary to make a definitive decision. If she has a mastectomy she generally would not require radiation. If she has a lumpectomy then she would receive adjuvant radiation which she would like to have done in the.  As far as medical oncology follow-up she understands she is not a candidate for anti-estrogens and we do not do chemotherapy for ductal carcinoma in situ tumors. She does warrant follow-up and we generally see patients with ductal carcinoma in situ once a year for 5 years. She would prefer to have medical oncology follow-up in [place] and we will refer her to our partner Dr. Penland there, probably to see the patient February or so by which time all her local treatment should be completed.  Incidentally today on her lab work her potassium was 2.6. She is on hydrochlorothiazide, but not on potassium supplementation. I have put in a prescription for potassium 20 mEq to be taken  daily starting this evening. She is aware of this.    I will be glad to see Michiah again at this clinic anytime in the future if I when the need arises, but as of now we are making no further routine appointments for her here.  MAGRINAT,GUSTAV C, MD   08/31/2015 1:53 PM Medical Oncology and Hematology [facility] [address] [place], [place] [address] Tel. [phone]    Fax. [phone]      

## 2015-08-31 NOTE — Patient Instructions (Signed)

## 2015-08-31 NOTE — Progress Notes (Addendum)
Radiation Oncology         [phone] ________________________________  Initial outpatient Consultation  Name: Sarah Cooley MRN: [id number]  Date: 08/31/2015  DOB: 09-19-1966  ZY:SAYTKZ,SWFUX, NP  Delia Chimes, NP   REFERRING PHYSICIAN: Delia Chimes, NP  DIAGNOSIS:    ICD-9-CM ICD-10-CM   1. Breast cancer of lower-inner quadrant of right female breast (HCC) 174.3 C50.311    Stage 0 [id number] Right  Breast LIQ  Ductal Carcinoma In Situ, ERneg / PRneg / High Grade   HISTORY OF PRESENT ILLNESS::Sarah Cooley is a 49 y.o. female who presented with of right breast asymmetry.   A Prior biopsy was attempted in Feb, but lesion could not be localized.  More recent Biopsy showed DCIS with characteristics as described above in the diagnosis. Appearing to be involving a papilloma.  She is in her [place], lives in [place], works in [place]: No  PAST MEDICAL HISTORY:  has a past medical history of Fibromyalgia; Depression; OSA (obstructive sleep apnea); Hypertension; Hyperlipidemia; Diabetes mellitus without complication ([place]); Breast cancer ([place]); Fibromyalgia; Arthritis; and Hyperlipemia.    PAST SURGICAL HISTORY: Past Surgical History  Procedure Laterality Date  . Bone graft hip iliac crest    . Abdominal hysterectomy    . Bladder surgery    . Knee arthroscopy w/ acl reconstruction      right  . Cholecystectomy      FAMILY HISTORY: family history includes Breast cancer in her paternal aunt; Colon cancer in her paternal uncle and paternal uncle; Heart disease in her maternal aunt; Hypertension in her mother; Lung cancer in her paternal aunt and paternal grandfather; Lupus in her sister.  SOCIAL HISTORY:  reports that she has never smoked. She does not have any smokeless tobacco history on file. She reports that she drinks alcohol. She reports that she does not use illicit drugs.  ALLERGIES: Shrimp; Erythromycin; Minocycline hcl;  Ramipril; Drixoral; and Sinutab  MEDICATIONS:  Current Outpatient Prescriptions  Medication Sig Dispense Refill  . atorvastatin (LIPITOR) 40 MG tablet Take 40 mg by mouth daily.    . cetirizine (ZYRTEC) 10 MG tablet Take 10 mg by mouth daily as needed for allergies.     . clonazePAM (KLONOPIN) 1 MG tablet Take 1 mg by mouth 3 (three) times daily as needed. For anxiety    . Coenzyme Q10 (CO Q 10) 10 MG CAPS Take 1 capsule by mouth daily.    . diclofenac (VOLTAREN) 75 MG EC tablet Take 75 mg by mouth 2 (two) times daily as needed for mild pain.     Marland Kitchen docusate sodium (COLACE) 100 MG capsule Take 100 mg by mouth daily as needed for mild constipation.    . fexofenadine (ALLEGRA) 30 MG tablet Take 30 mg by mouth as needed.    Marland Kitchen FLUoxetine (PROZAC) 20 MG capsule Take 60 mg by mouth daily.     Marland Kitchen gabapentin (NEURONTIN) 600 MG tablet Take 1 tablet (600 mg total) by mouth at bedtime.    . hydrochlorothiazide (HYDRODIURIL) 25 MG tablet Take 25 mg by mouth daily.    Marland Kitchen HYDROcodone-acetaminophen (NORCO/VICODIN) 5-325 MG per tablet Take 0.5-1 tablets by mouth every 6 (six) hours as needed for moderate pain.     Marland Kitchen ibuprofen (ADVIL,MOTRIN) 200 MG tablet Take 800 mg by mouth every 6 (six) hours as needed for mild pain.    Marland Kitchen losartan (COZAAR) 100 MG tablet Take 100 mg by mouth daily.    . metFORMIN (  GLUCOPHAGE) 500 MG tablet Take 500 mg by mouth 2 (two) times daily with a meal.    . potassium chloride SA (K-DUR,KLOR-CON) 20 MEQ tablet Take 1 tablet (20 mEq total) by mouth 2 (two) times daily. 90 tablet 4  . primidone (MYSOLINE) 50 MG tablet Take 1 tablet (50 mg total) by mouth at bedtime. 30 tablet 3  . triamcinolone (NASACORT ALLERGY 24HR) 55 MCG/ACT AERO nasal inhaler Place 2 sprays into the nose daily as needed (Allergies).    . Vitamin D, Ergocalciferol, (DRISDOL) 50000 UNITS CAPS capsule Take 50,000 Units by mouth 2 (two) times a week.    . vitamin E 400 UNIT capsule Take 400 Units by mouth daily.    Marland Kitchen  zolpidem (AMBIEN) 10 MG tablet Take 10 mg by mouth at bedtime.     No current facility-administered medications for this encounter.    REVIEW OF SYSTEMS:  Notable for that above.   Gynecologic History  Age at first menstrual period? 12  Are you still having periods? No Approximate date of last period? After hysterectomy.  If you are still having periods: Are your periods regular? N/A  If you no longer have periods: Have you used hormone replacement? No  If YES, for how long? N/A When did you stop? N/A Obstetric History:  How many children have you carried to term? 2 Your age at first live birth? 72  Pregnant now or trying to get pregnant? No  Have you used birth control pills or hormone shots for contraception? Yes  If so, for how long (or approximate dates)? 3 years max in her 48s  Would you be interested in learning more about the options to preserve fertility? No Health Maintenance:  Have you ever had a colonoscopy? No If yes, date? N/A  Have you ever had a bone density? No If yes, date? N/A  Date of your last PAP smear? 2005? Date of your FIRST mammogram? 2002   PHYSICAL EXAM:  Vitals with BMI 08/31/2015  Height 5\' 8"   Weight 230 lbs 5 oz  BMI 67.3  Systolic 419  Diastolic 84  Pulse 99  Respirations 18   General: Alert and oriented, in no acute distress HEENT: Head is normocephalic. Extraocular movements are intact. Oropharynx is clear. Neck: Neck is supple, no palpable cervical or supraclavicular lymphadenopathy. Heart: Regular in rate and rhythm with no murmurs, rubs, or gallops. Chest: Clear to auscultation bilaterally, with no rhonchi, wheezes, or rales. Abdomen: Soft, nontender, nondistended, with no rigidity or guarding. Extremities: No cyanosis. No wrist edema. Lymphatics: see Neck Exam Skin: No concerning lesions. Musculoskeletal: symmetric strength and muscle tone throughout. Neurologic: Cranial nerves II through XII are grossly intact. No obvious focalities.  Speech is fluent. Coordination is intact. Psychiatric: Judgment and insight are intact. Affect is appropriate. Breasts:    Right: In the lower inner quadrant of the right breast she has some post biopsy bruising but no clear mass. No palpable axillary adenopathy.    Left: No palpable masses and no palpable axillary adenopathy.     ECOG = 0  0 - Asymptomatic (Fully active, able to carry on all predisease activities without restriction)  1 - Symptomatic but completely ambulatory (Restricted in physically strenuous activity but ambulatory and able to carry out work of a light or sedentary nature. For example, light housework, office work)  2 - Symptomatic, <50% in bed during the day (Ambulatory and capable of all self care but unable to carry out any work activities. Up and  about more than 50% of waking hours)  3 - Symptomatic, >50% in bed, but not bedbound (Capable of only limited self-care, confined to bed or chair 50% or more of waking hours)  4 - Bedbound (Completely disabled. Cannot carry on any self-care. Totally confined to bed or chair)  5 - Death   Eustace Pen MM, Creech RH, Tormey DC, et al. [phone]). "Toxicity and response criteria of the [facility] Group". [place] Oncol. 5 (6): 649-55   LABORATORY DATA:  Lab Results  Component Value Date   WBC 8.1 08/31/2015   HGB 15.5 08/31/2015   HCT 45.4 08/31/2015   MCV 95.0 08/31/2015   PLT 253 08/31/2015   CMP     Component Value Date/Time   NA 138 08/31/2015 1217   NA 138 07/18/2015 0508   K 2.6* 08/31/2015 1217   K 3.4* 07/18/2015 0508   CL 102 07/18/2015 0508   CO2 27 08/31/2015 1217   CO2 26 07/18/2015 0508   GLUCOSE 114 08/31/2015 1217   GLUCOSE 93 07/18/2015 0508   BUN 10.3 08/31/2015 1217   BUN 12 07/18/2015 0508   CREATININE 0.8 08/31/2015 1217   CREATININE 0.79 07/18/2015 0508   CALCIUM 9.7 08/31/2015 1217   CALCIUM 8.3* 07/18/2015 0508   PROT 7.8 08/31/2015 1217   PROT 6.9 07/18/2015 0508    ALBUMIN 3.9 08/31/2015 1217   ALBUMIN 3.7 07/18/2015 0508   AST 24 08/31/2015 1217   AST 29 07/18/2015 0508   ALT 37 08/31/2015 1217   ALT 36 07/18/2015 0508   ALKPHOS 70 08/31/2015 1217   ALKPHOS 54 07/18/2015 0508   BILITOT 0.64 08/31/2015 1217   BILITOT 0.7 07/18/2015 0508   GFRNONAA >60 07/18/2015 0508   GFRAA >60 07/18/2015 0508         RADIOGRAPHY: US Breast Ltd Uni Right Inc Axilla  08/19/2015  CLINICAL DATA:  Six-month follow-up for probably benign asymmetry within the lower inner quadrant of the right breast. A stereotactic biopsy was attempted on 11/25/2014 but the asymmetry could not be reproduced at that time. EXAM: DIGITAL DIAGNOSTIC RIGHT MAMMOGRAM WITH 3D TOMOSYNTHESIS WITH CAD ULTRASOUND RIGHT BREAST COMPARISON:  Previous exams including diagnostic mammogram of 11/15/2014. ACR Breast Density Category b: There are scattered areas of fibroglandular density. FINDINGS: Bilateral CC and MLO views were obtained with tomosynthesis. With the benefit of tomosynthesis, the asymmetry is again identified within the lower inner quadrant of the right breast, at posterior depth, now masslike in appearance on tomosynthesis images and best seen on the tomosynthesis CC slice 17 and MLO slice 43. There are several new punctate calcifications within this asymmetry. No new dominant masses, suspicious calcifications or secondary signs of malignancy within the remainder of the right breast. Mammographic images were processed with CAD. Targeted ultrasound is performed, showing no sonographic correlate for the right breast asymmetry. No suspicious solid or cystic mass is identified within the lower inner quadrant of the right breast. IMPRESSION: Masslike asymmetry within the lower inner quadrant of the right breast, at posterior depth, with associated microcalcifications which are new. No sonographic correlate. Recommend stereotactic biopsy with tomosynthesis guidance. RECOMMENDATION: Stereotactic-guided  biopsy, with tomosynthesis guidance, for the masslike asymmetry in the lower inner quadrant of the right breast with associated microcalcifications. Stereotactic guided biopsy is scheduled for Tuesday, November 1st at 9 a.m. I have discussed the findings and recommendations with the patient. Results were also provided in writing at the conclusion of the visit. If applicable, a reminder letter will be  sent to the patient regarding the next appointment. BI-RADS CATEGORY  4: Suspicious. Electronically Signed   By: Franki Cabot M.D.   On: 08/19/2015 11:14   Mm Diag Breast Tomo Uni Right  08/23/2015  CLINICAL DATA:  Stereotactic biopsy of the right breast at tomographic technique was performed of an asymmetry in the lower inner quadrant, with associated calcifications. EXAM: 3D DIAGNOSTIC RIGHT MAMMOGRAM POST STEREOTACTIC BIOPSY COMPARISON:  Previous exam(s). FINDINGS: 3D Mammographic images were obtained following stereotactic guided biopsy of a focal asymmetry in the lower inner quadrant of the right breast, with associated calcifications. An X shaped biopsy clip is 1.0 cm directly medial to the asymmetry and biopsy changes. On the 90 degrees lateral view, the biopsy clip is correctly positioned in the superior to inferior plane. IMPRESSION: X shaped biopsy clip 1.0 cm directly medial to the biopsy site. Final Assessment: Post Procedure Mammograms for Marker Placement Electronically Signed   By: Curlene Dolphin M.D.   On: 08/23/2015 10:18   Mm Diag Breast Tomo Uni Right  08/19/2015  CLINICAL DATA:  Six-month follow-up for probably benign asymmetry within the lower inner quadrant of the right breast. A stereotactic biopsy was attempted on 11/25/2014 but the asymmetry could not be reproduced at that time. EXAM: DIGITAL DIAGNOSTIC RIGHT MAMMOGRAM WITH 3D TOMOSYNTHESIS WITH CAD ULTRASOUND RIGHT BREAST COMPARISON:  Previous exams including diagnostic mammogram of 11/15/2014. ACR Breast Density Category b: There are  scattered areas of fibroglandular density. FINDINGS: Bilateral CC and MLO views were obtained with tomosynthesis. With the benefit of tomosynthesis, the asymmetry is again identified within the lower inner quadrant of the right breast, at posterior depth, now masslike in appearance on tomosynthesis images and best seen on the tomosynthesis CC slice 17 and MLO slice 43. There are several new punctate calcifications within this asymmetry. No new dominant masses, suspicious calcifications or secondary signs of malignancy within the remainder of the right breast. Mammographic images were processed with CAD. Targeted ultrasound is performed, showing no sonographic correlate for the right breast asymmetry. No suspicious solid or cystic mass is identified within the lower inner quadrant of the right breast. IMPRESSION: Masslike asymmetry within the lower inner quadrant of the right breast, at posterior depth, with associated microcalcifications which are new. No sonographic correlate. Recommend stereotactic biopsy with tomosynthesis guidance. RECOMMENDATION: Stereotactic-guided biopsy, with tomosynthesis guidance, for the masslike asymmetry in the lower inner quadrant of the right breast with associated microcalcifications. Stereotactic guided biopsy is scheduled for Tuesday, November 1st at 9 a.m. I have discussed the findings and recommendations with the patient. Results were also provided in writing at the conclusion of the visit. If applicable, a reminder letter will be sent to the patient regarding the next appointment. BI-RADS CATEGORY  4: Suspicious. Electronically Signed   By: Franki Cabot M.D.   On: 08/19/2015 11:14   Mm Rt Breast Bx W Loc Dev 1st Lesion Image Bx Spec Stereo Guide  08/25/2015  ADDENDUM REPORT: 08/25/2015 13:18 ADDENDUM: Pathology reveals at least high grade (III) ductal carcinoma in situ of the lower inner quadrant of the Right breast. This was found to be concordant by Dr. Curlene Dolphin.  Pathology results were discussed with the patient via telephone. She reported tenderness at the biopsy site and is doing well otherwise. Post biopsy care and instructions were reviewed and questions were answered. The patient was encouraged to call [facility] with any additional questions and or concerns. The patient was referred to [facility] on August 31, 2015. Pathology results reported by Terie Purser RN on August 25, 2015. Electronically Signed   By: Curlene Dolphin M.D.   On: 08/25/2015 13:18  08/25/2015  CLINICAL DATA:  3D stereotactic biopsy of the right breast was recommended, for sampling of an asymmetry in the medial right breast. Asymmetry contains a few calcifications. EXAM: RIGHT BREAST STEREOTACTIC CORE NEEDLE BIOPSY COMPARISON:  Previous exams. FINDINGS: The patient and I discussed the procedure of stereotactic-guided biopsy including benefits and alternatives. We discussed the high likelihood of a successful procedure. We discussed the risks of the procedure including infection, bleeding, tissue injury, clip migration, and inadequate sampling. Informed written consent was given. The usual time out protocol was performed immediately prior to the procedure. Using sterile technique and 2% Lidocaine as local anesthetic, under stereotactic guidance, a 9 gauge vacuum assisted device was used to perform core needle biopsy of asymmetry with associated calcifications in the lower inner quadrant of the right breast using a medial to lateral approach. Specimen radiograph was performed showing several calcifications. Specimens with calcifications are identified for pathology. At the conclusion of the procedure, a X shaped tissue marker clip was deployed into the biopsy cavity. Follow-up 2-view mammogram was performed and dictated separately. IMPRESSION: Stereotactic-guided biopsy of the right breast. No  apparent complications. Electronically Signed: By: Curlene Dolphin M.D. On: 08/23/2015 10:13      IMPRESSION/PLAN: She has been discussed at our multidisciplinary tumor board.  The consensus is that she would be a good candidate for breast conservation. I talked to her about the option of a mastectomy and informed her that her expected overall survival would be equivalent between mastectomy and breast conservation, based upon randomized controlled data. She is enthusiastic about breast conservation. However, if genetic testing comes back positive, she will opt for a double mastectomy.  It was a pleasure meeting the patient today. We discussed the risks, benefits, and side effects of radiotherapy. We discussed that radiation would take approximately 6-7 weeks to complete and that I would give the patient a few weeks to heal following surgery before starting treatment planning. We spoke about acute effects including skin irritation and fatigue as well as much less common late effects including lung irritation. We spoke about the latest technology that is used to minimize the risk of late effects for breast cancer patients undergoing radiotherapy. No guarantees of treatment were given. The patient is enthusiastic about proceeding with treatment. I look forward to participating in the patient's care. She will be referred back to me in [place] if she decides on breast conservation.  Genetic counseling has been ordered in light of family history.     __________________________________________   Eppie Gibson, MD     This document serves as a record of services personally performed by Eppie Gibson, MD. It was created on her behalf by Arlyce Harman, a trained medical scribe. The creation of this record is based on the scribe's personal observations and the provider's statements to them. This document has been checked and approved by the attending provider.

## 2015-08-31 NOTE — Therapy (Signed)
[place], [place], [address] Phone: [phone]   Fax:  [phone]  Physical Therapy Evaluation  Patient Details  Name: Kishia Shackett MRN: [id number] Date of Birth: [birth year] Referring Provider: Dr. Erroll Luna  Encounter Date: 08/31/2015      PT End of Session - 08/31/15 1625    Visit Number 1   Number of Visits 1   PT Start Time 1430   PT Stop Time 1502   PT Time Calculation (min) 32 min   Activity Tolerance Patient tolerated treatment well   Behavior During Therapy [facility] for tasks assessed/performed      Past Medical History  Diagnosis Date  . Fibromyalgia   . Depression   . OSA (obstructive sleep apnea)   . Hypertension   . Hyperlipidemia   . Diabetes mellitus without complication ([place])   . Breast cancer ([place])   . Fibromyalgia   . Arthritis   . Hyperlipemia   . Family history of breast cancer   . Family history of colon cancer     Past Surgical History  Procedure Laterality Date  . Bone graft hip iliac crest    . Abdominal hysterectomy    . Bladder surgery    . Knee arthroscopy w/ acl reconstruction      right  . Cholecystectomy      There were no vitals filed for this visit.  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            [facility] PT Assessment - 08/31/15 0001    Assessment   Medical Diagnosis Right breast cancer   Referring Provider Dr. Marcello Moores [name]   Onset Date/Surgical Date 08/19/15   Hand Dominance Right   Prior Therapy none   Precautions   Precautions Fall;Other (comment)  Active breast cancer    Restrictions   Weight Bearing Restrictions No   Balance Screen   Has the patient fallen in the past 6 months Yes   How many times? 4   Has the patient had a decrease in activity level because of a fear of falling?  Yes  Declined PT at this time; will call me if she changes mind   Is the patient reluctant to leave their home because of a fear of falling?  No  She has had multiple tests to determine cause of falls    [place] residence   Living Arrangements Spouse/significant other;Children  Husband and 22 y.o. daughter and her fiancee   Available Help at Discharge Family   Prior Function   Level of Independence Independent   Vocation Full time employment   Patent examiner Asst at [employer] She does not exercise   Cognition   Overall Cognitive Status Within Functional Limits for tasks assessed   Posture/Postural Control   Posture/Postural Control Postural limitations   Postural Limitations Rounded Shoulders;Forward head;Increased thoracic kyphosis   ROM / Strength   AROM / PROM / Strength AROM;Strength   AROM   AROM Assessment Site Shoulder   Right/Left Shoulder Right;Left  Right Shoulder Extension 52 Degrees   Right Shoulder Flexion 152 Degrees   Right Shoulder ABduction 150 Degrees   Right Shoulder Internal Rotation 66 Degrees   Right Shoulder External Rotation 82 Degrees   Left Shoulder Extension 50 Degrees   Left Shoulder Flexion 135 Degrees   Left Shoulder ABduction 150 Degrees   Left Shoulder Internal Rotation 62 Degrees   Left Shoulder External Rotation 70 Degrees   Strength   Overall Strength Within functional limits for tasks performed           LYMPHEDEMA/ONCOLOGY QUESTIONNAIRE - 08/31/15 1622    Type   Cancer Type Right breast cancer   Lymphedema Assessments   Lymphedema Assessments Upper extremities   Right Upper Extremity Lymphedema   10 cm Proximal to Olecranon Process 31.5 cm    Olecranon Process 27.2 cm   10 cm Proximal to Ulnar Styloid Process 25.6 cm   Just Proximal to Ulnar Styloid Process 17.8 cm   Across Hand at [employer] 20.2 cm   At [place] of 2nd Digit 5.8 cm   Left Upper Extremity Lymphedema   10 cm Proximal to Olecranon Process 32.5 cm   Olecranon Process 27.6 cm   10 cm Proximal to Ulnar Styloid Process 25.5 cm   Just Proximal to Ulnar Styloid Process 17.1 cm   Across Hand at [employer] 19.7 cm   At [place] of 2nd Digit 6.5 cm     Patient was instructed today in a home exercise program today for post op shoulder range of motion. These included active assist shoulder flexion in sitting, scapular retraction, wall walking with shoulder abduction, and hands behind head external rotation.  She was encouraged to do these twice a day, holding 3 seconds and repeating 5 times when permitted by her physician.        PT Education - 08/31/15 1625    Education provided Yes   Education Details Lymphedema risk reduction and post op shoulder ROM HEP   Person(s) Educated Patient;Spouse   Methods Explanation;Demonstration;Handout   Comprehension Returned demonstration;Verbalized understanding              Breast Clinic Goals - 08/31/15 1628    Patient will be able to verbalize understanding of pertinent lymphedema risk reduction practices relevant to her diagnosis specifically related to skin care.   Time 1   Period Days   Status Achieved   Patient will be able to return demonstrate and/or verbalize understanding of the post-op home exercise program related to regaining shoulder range of motion.   Time 1   Period Days   Status Achieved   Patient will be able to verbalize understanding of the importance of attending the postoperative After Breast Cancer Class for further lymphedema risk reduction education and therapeutic exercise.   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  She is planning to have genetic testing, a right lumpectomy with a sentinel node biopsy, and radiation.  She may benefit from PT to reduce fall risk and agreed to contact us if she decided to try therapy for her balance.  She may also benefit from post op PT to regain shoulder ROM and strength.   Pt will benefit from skilled therapeutic intervention in order to improve  on the following deficits Decreased strength;Pain;Decreased knowledge of precautions;Impaired UE functional use;Decreased range of motion   Rehab Potential Excellent   Clinical Impairments Affecting Rehab Potential none   PT Frequency One time visit   PT Treatment/Interventions Therapeutic exercise;Patient/family education   Consulted and Agree with Plan of Care Patient;Family member/caregiver   Family Member Consulted Husband       Patient will follow up at outpatient cancer rehab if needed following surgery.  If the patient requires physical therapy at that time, a specific plan will be dictated and sent to the referring physician for approval. The patient was educated today on appropriate basic range of motion exercises to begin post operatively and the importance of attending the After Breast Cancer class following surgery.  Patient was educated today on lymphedema risk reduction practices as it pertains to recommendations that will benefit the patient immediately following surgery.  She verbalized good understanding.  No additional physical therapy is indicated at this time.      Problem List Patient Active Problem List   Diagnosis Date Noted  . Family history of breast cancer   . Family history of colon cancer   . Breast cancer of lower-inner quadrant of right female breast ([place]) 08/26/2015  . Pain in the chest   . Cephalalgia   . Headache 07/17/2015  . Elevated troponin 07/17/2015  . Hypertension 07/17/2015   . Obesity 07/17/2015    Annia Friendly, PT 08/31/2015 4:30 PM   [place], [place], [address] Phone: [phone]   Fax:  [phone]  Name: Kimmarie Pascale MRN: [id number] Date of Birth: July 18, 1966

## 2015-08-31 NOTE — Progress Notes (Signed)
REFERRING PROVIDER: Delia Chimes, NP [place], [place] [address]   Lurline Del, MD  PRIMARY PROVIDER:  Delia Chimes, NP  PRIMARY REASON FOR VISIT:  1. Breast cancer of lower-inner quadrant of right female breast ([place])   2. Family history of breast cancer   3. Family history of colon cancer      HISTORY OF PRESENT ILLNESS:   Ms. Ricklefs, a 49 y.o. female, was seen for a [place] cancer genetics consultation at the request of Dr. Jana Hakim due to a personal and family history of cancer.  Ms. Volpi presents to clinic today to discuss the possibility of a hereditary predisposition to cancer, genetic testing, and to further clarify her future cancer risks, as well as potential cancer risks for family members.   In 2016, at the age of 69, Ms. Syme was diagnosed with DCIS of the right breast.  The tumor is ER-/PR-. This will be treated with lumpectomy, unless her genetics is positive.    CANCER HISTORY:    Breast cancer of lower-inner quadrant of right female breast ([place])   08/19/2015 Breast US Masslike asymmetry within the lower inner quadrant of the right breast, at posterior depth, with associated microcalcifications which are new.    08/23/2015 Initial Biopsy AT LEAST HIGH DUCTAL CARCINOMA IN SITU   08/23/2015 Receptors her2 Estrogen Receptor: 0%, NEGATIVE Progesterone Receptor: 0%, NEGATIVE   08/26/2015 Initial Diagnosis Breast cancer of lower-inner quadrant of right female breast ([place])     HORMONAL RISK FACTORS:  Menarche was at age 65.  First live birth at age 21.  OCP use for approximately 2-3 years.  Ovaries intact: yes.  Hysterectomy: yes.  Menopausal status: perimenopausal.  HRT use: 0 years. Colonoscopy: yes; normal. Mammogram within the last year: yes. Number of breast biopsies: 1. Up to date with pelvic exams:  yes. Any excessive radiation exposure in the past:  no  Past Medical History  Diagnosis Date  . Fibromyalgia   . Depression    . OSA (obstructive sleep apnea)   . Hypertension   . Hyperlipidemia   . Diabetes mellitus without complication ([place])   . Breast cancer ([place])   . Fibromyalgia   . Arthritis   . Hyperlipemia   . Family history of breast cancer   . Family history of colon cancer     Past Surgical History  Procedure Laterality Date  . Bone graft hip iliac crest    . Abdominal hysterectomy    . Bladder surgery    . Knee arthroscopy w/ acl reconstruction      right  . Cholecystectomy      Social History   Social History  . Marital Status: Married    Spouse Name: Psychologist, clinical  . Number of Children: 2  . Years of Education: N/A   Occupational History  . physician office    Social History Main Topics  . Smoking status: Never Smoker   . Smokeless tobacco: Not on file  . Alcohol Use: 0.0 oz/week    0 Standard drinks or equivalent per week     Comment: 2-3 times a year  . Drug Use: No  . Sexual Activity: Yes   Other Topics Concern  . Not on file   Social History Narrative     FAMILY HISTORY:  We obtained a detailed, 4-generation family history.  Significant diagnoses are listed below: Family History  Problem Relation Age of Onset  . Hypertension Mother   . Heart disease Maternal Aunt   .  Lung cancer Paternal Aunt   . Breast cancer Paternal Aunt     dx in her 72s  . Lung cancer Paternal Grandfather   . Colon cancer Paternal Uncle     dx in her 39s  . Colon cancer Paternal Uncle     dx in his 74s  . Melanoma Father 56  . Breast cancer Sister     dx in her 81s  . Lupus Sister   . COPD Maternal Uncle   . Colon cancer Paternal Uncle     dx in his 5s   The patient has two children who are cancer free.  She has one brother and one sister.  Her sister was diagnosed with breast cancer in her late 48s.  Her father was diagnosed with melanoma in his late 8s.  He had 10 siblings.  Three brother had colon cancer, a sister had breast cancer and a brother had lung cancer.  His father either had  colon or lung cancer.  There is no other reported family history of cancer.  Patient's ancestors are of Caucasian descent. There is no reported Ashkenazi Jewish ancestry. There is no known consanguinity.  GENETIC COUNSELING ASSESSMENT: Italia Wolfert is a 49 y.o. female with a personal and family history of breast cancer and family history of colon cancer which somewhat suggestive of a hereditary cancer syndrome and predisposition to cancer. We, therefore, discussed and recommended the following at today's visit.   DISCUSSION: About 5-10% of breast cancer is hereditary, with most causes the result of BRCA mutations.  Based on her ER-/PR- cancer, we discussed the increased risk for PALB2 mutations.  Additionally, we reviewed Lynch syndrome and CHEK2 mutations because of the family history of colon cancer.  We reviewed the characteristics, features and inheritance patterns of hereditary cancer syndromes. We also discussed genetic testing, including the appropriate family members to test, the process of testing, insurance coverage and turn-around-time for results. We discussed the implications of a negative, positive and/or variant of uncertain significant result. In order to get genetic test results in a timely manner so that Ms. Paganelli can use these genetic test results for surgical decisions, we recommended Ms. Commins pursue genetic testing for the Breast High/Moderate Risk Panel. If this test is negative, we then recommend Ms. Coplen pursue reflex genetic testing to the Rest of the Comprehensive cancer gene panel and the MSH2 [place] inversion.  The Comprehensive Cancer Panel offered by [facility] includes sequencing and/or deletion duplication testing of the following 32 genes: APC, ATM, AXIN2, BARD1, BMPR1A, BRCA1, BRCA2, BRIP1, CDH1, CDK4, CDKN2A, CHEK2, EPCAM, FANCC, MLH1, MSH2, MSH6, MUTYH, NBN, PALB2, PMS2, POLD1, POLE, PTEN, RAD51C, RAD51D, SCG5/GREM1, SMAD4, STK11, TP53, VHL, and XRCC2.       Based on Ms. Trower's personal and family history of cancer, she meets medical criteria for genetic testing. Despite that she meets criteria, she may still have an out of pocket cost. We discussed that if her out of pocket cost for testing is over $100, the laboratory will call and confirm whether she wants to proceed with testing.  If the out of pocket cost of testing is less than $100 she will be billed by the genetic testing laboratory.   PLAN: After considering the risks, benefits, and limitations, Ms. Kiedrowski  provided informed consent to pursue genetic testing and the blood sample was sent to [facility] for analysis of the Comprehensive cancer panel. Results should be available within approximately 2-3 weeks' time, at which point they will  be disclosed by telephone to Ms. Kinyon, as will any additional recommendations warranted by these results. Ms. Dombek will receive a summary of her genetic counseling visit and a copy of her results once available. This information will also be available in Epic. We encouraged Ms. Yamaguchi to remain in contact with cancer genetics annually so that we can continuously update the family history and inform her of any changes in cancer genetics and testing that may be of benefit for her family. Ms. Wilhelmi questions were answered to her satisfaction today. Our contact information was provided should additional questions or concerns arise.  Lastly, we encouraged Ms. Stamps to remain in contact with cancer genetics annually so that we can continuously update the family history and inform her of any changes in cancer genetics and testing that may be of benefit for this family.   Ms.  Dissinger questions were answered to her satisfaction today. Our contact information was provided should additional questions or concerns arise. Thank you for the referral and allowing [place] to share in the care of your patient.   Markell Schrier P. [place]ne [place], [place], [facility] Certified Genetic  Counselor [email or web address] phone: [phone]  The patient was seen for a total of 30 minutes in face-to-face genetic counseling.  This patient was discussed with Drs. Magrinat, Lindi Adie and/or Burr Medico who agrees with the above.    _______________________________________________________________________ For Office Staff:  Number of people involved in session: 2 Was an Intern/ student involved with case: no

## 2015-08-31 NOTE — H&P (Signed)
Sarah Cooley 08/31/2015 7:47 AM Location: [facility] Patient #: [id number] DOB: [birth year] Undefined / Language: Sarah Cooley / Race: White Female  History of Present Illness Marcello Moores A. Jameia Makris MD; 08/31/2015 3:33 PM) Patient words: patient sent at the request of Dr Isidore Moos for mammographic abnormality in right breast. Thisiopsy was donewed for the last 6 months and core biopsy was done due to change in the appearance of this ar microinvasion.d high-grade DCIS with possible microinvasion. Patient denies breast pain, breast mass or nipple discharge. 2 first-degree relatives with breast cancer noted.                 ADDITIONAL INFORMATION: PROGNOSTIC INDICATORS Results: IMMUNOHISTOCHEMICAL AND MORPHOMETRIC ANALYSIS PERFORMED MANUALLY Estrogen Receptor: 0%, NEGATIVE Progesterone Receptor: 0%, NEGATIVE COMMENT: The negative hormone receptor study(ies) in this case has an internal positive control. REFERENCE RANGE ESTROGEN RECEPTOR NEGATIVE 0% POSITIVE =>1% REFERENCE RANGE PROGESTERONE RECEPTOR NEGATIVE 0% POSITIVE =>1% All controls stained appropriately Enid Cutter MD Pathologist, Electronic Signature ( Signed 08/30/2015) FINAL DIAGNOSIS Diagnosis Breast, right, needle core biopsy, lower inner quadrant - AT LEAST HIGH DUCTAL CARCINOMA IN SITU SEE COMMENT. 1 of 2 FINAL for Sarah Cooley ([id number]) Microscopic Comment Although the grade of tumor is best assessed at resection, with these biopsies, in situ carcinoma is grade 3. There is nodular densely hyalinized stroma associated with some of the mammary carcinoma. Although there are no definitive features of papillary lesion present, the differential includes involvement of a papillary lesion by in situ carcinoma and a malignant papillary lesion. Smooth muscle myosin heavy chain, calponin and p63 immunostains were used in the diagnostic work-up of the case (blocks 1A and 1B). Breast prognostic studies  are pending and will be reported in an addendum. Is reviewed with Dr. Gari Crown who concurs. (CRR:gt, 08/25/15) Mali RUND DO Pathologist, Electronic Signature (Case signed 08/25/2015) Specimen Gross and Clinical Information Specimen Comment In formalin 9:50, extracted <10 mins; focal asymmetry with several calcifications Specimen(s) Obtained: Breast, right, needle core biopsy, lower inner quadrant Specimen Clinical Information ? [place] [facility] Gross Received in formalin at 0950 hours (cold ischemia time less than 10 minutes) are multiple cores and irregular pieces of gray white to yellow soft tissue, 2.1 x 2 x 0.3 cm in aggregate, entirely submitted in two blocks. (SW:gt, 08/24/15) Stain(s) used in Diagnosis: The following stain(s) were used in diagnosing the case: Smooth Muscle Myosin - 1 Heavy Chain, PR-ACIS, Calponin, P63, ER-ACIS. The control(s) stained appropriately. Disclaimer PR progesterone receptor (PgR 636), immunohistochemical stains are performed on formalin fixed, paraffin embedded tissue using a 3,3"-diaminobenzidine (DAB) chromogen and DAKO Autostainer System. The staining intensity of the nucleus is scored morphometrically using the Automated Cellular Imaging System (ACIS) and is reported as the percentage of tumor cell nuclei demonstrating specific nuclear staining. Estrogen receptor (SP1), immunohistochemical stains are performed on formalin fixed, paraffin embedded tissue using a 3,3"-diaminobenzidine (DAB) chromogen and DAKO Autostainer System. The staining intensity of the nucleus is scored morphometrically using the Automated Cellular Imaging System (ACIS) and is reported as the percentage of tumor cell nuclei demonstrating specific nuclear staining. Some of these immunohistochemical stains may have been developed and the       CLINICAL DATA: Six-month follow-up for probably benign asymmetry within the lower inner quadrant of the right breast.  A stereotactic biopsy  was attempted on 11/25/2014 but the asymmetry could not be reproduced at that time.  EXAM: DIGITAL DIAGNOSTIC RIGHT MAMMOGRAM WITH 3D TOMOSYNTHESIS WITH CAD  ULTRASOUND RIGHT BREAST  COMPARISON: Previous  exams including diagnostic mammogram of 11/15/2014.  ACR Breast Density Category b: There are scattered areas of fibroglandular density.  FINDINGS: Bilateral CC and MLO views were obtained with tomosynthesis. With the benefit of tomosynthesis, the asymmetry is again identified within the lower inner quadrant of the right breast, at posterior depth, now masslike in appearance on tomosynthesis images and best seen on the tomosynthesis CC slice 17 and MLO slice 43. There are several new punctate calcifications within this asymmetry.  No new dominant masses, suspicious calcifications or secondary signs of malignancy within the remainder of the right breast.  Mammographic images were processed with CAD.  Targeted ultrasound is performed, showing no sonographic correlate for the right breast asymmetry. No suspicious solid or cystic mass is identified within the lower inner quadrant of the right breast.  IMPRESSION: Masslike asymmetry within the lower inner quadrant of the right breast, at posterior depth, with associated microcalcifications which are new. No sonographic correlate. Recommend stereotactic biopsy with tomosynthesis guidance.  RECOMMENDATION: Stereotactic-guided biopsy, with tomosynthesis guidance, for the masslike asymmetry in the lower inner quadrant of the right breast with associated microcalcifications.  Stereotactic guided biopsy is scheduled for Tuesday, November 1st at 9 a.m.  I have discussed the findings and recommendations with the patient. Results were also provided in writing at the conclusion of the visit. If applicable, a reminder letter will be sent to the patient regarding the next appointment.  BI-RADS CATEGORY 4:  Suspicious.   Electronically Signed By: Franki Cabot M.D. On: 08/19/2015 11:14.  The patient is a 49 year old female.   Other Problems Sarah Cooley, CMA; 08/31/2015 7:47 AM) Anxiety Disorder Arthritis Back Pain Bladder Problems Breast Cancer Chest pain Depression Gastroesophageal Reflux Disease General anesthesia - complications Hemorrhoids High blood pressure Hypercholesterolemia Lump In Breast Migraine Headache Pancreatitis Sleep Apnea  Past Surgical History Sarah Cooley, [place]; 08/31/2015 7:47 AM) Breast Biopsy Right. Gallbladder Surgery - Laparoscopic Hysterectomy (not due to cancer) - Partial Knee Surgery Right. Oral Surgery  Diagnostic Studies History Sarah Cooley, [place]; 08/31/2015 7:47 AM) Colonoscopy never Mammogram within last year Pap Smear >5 years ago  Medication History Sarah Cooley, [place]; 08/31/2015 7:47 AM) No Current Medications Medications Reconciled  Social History Sarah Cooley, [place]; 08/31/2015 7:47 AM) Alcohol use Occasional alcohol use. Caffeine use Coffee. Tobacco use Never smoker.  Family History Sarah Cooley, [place]; 08/31/2015 7:47 AM) Alcohol Abuse Family Members In General. Anesthetic complications Mother. Arthritis Family Members In General, Mother. Bleeding disorder Mother. Breast Cancer Family Members In General, Sister. Cancer Family Members In General. Cerebrovascular Accident Family Members In General, Mother. Colon Cancer Family Members In General. Colon Polyps Family Members In General. Depression Family Members In General, Mother, Son. Diabetes Mellitus Family Members In General. Heart Disease Family Members In General, Father, Sister. Hypertension Family Members In General, Father, Sister. Ischemic Bowel Disease Family Members In General. Melanoma Father. Respiratory Condition Family Members In General, Mother.  Pregnancy / Birth History Sarah Cooley, [place]; 08/31/2015  7:47 AM) Age at menarche 5 years. Age of menopause <45 Contraceptive History Oral contraceptives. Gravida 3 Irregular periods Maternal age 59-25 Para 2     Review of Systems Sarah Cooley CMA; 08/31/2015 7:47 AM) General Present- Appetite Loss, Fatigue and Weight Gain. Not Present- Chills, Fever, Night Sweats and Weight Loss. Skin Present- Change in Wart/Mole and Dryness. Not Present- Hives, Jaundice, New Lesions, Non-Healing Wounds, Rash and Ulcer. HEENT Present- Earache, Hearing Loss, Ringing in the Ears, Seasonal Allergies, Visual Disturbances and Wears glasses/contact lenses. Not Present- Hoarseness,  Nose Bleed, Oral Ulcers, Sinus Pain, Sore Throat and Yellow Eyes. Respiratory Present- Snoring and Wheezing. Not Present- Bloody sputum, Chronic Cough and Difficulty Breathing. Breast Not Present- Breast Mass, Breast Pain, Nipple Discharge and Skin Changes. Cardiovascular Present- Swelling of Extremities. Not Present- Chest Pain, Difficulty Breathing Lying Down, Leg Cramps, Palpitations, Rapid Heart Rate and Shortness of Breath. Gastrointestinal Present- Change in Bowel Habits, Hemorrhoids and Nausea. Not Present- Abdominal Pain, Bloating, Bloody Stool, Chronic diarrhea, Constipation, Difficulty Swallowing, Excessive gas, Gets full quickly at meals, Indigestion, Rectal Pain and Vomiting. Female Genitourinary Not Present- Frequency, Nocturia, Painful Urination, Pelvic Pain and Urgency. Musculoskeletal Present- Back Pain, Joint Pain, Muscle Pain and Swelling of Extremities. Not Present- Joint Stiffness and Muscle Weakness. Neurological Present- Tremor and Trouble walking. Not Present- Decreased Memory, Fainting, Headaches, Numbness, Seizures, Tingling and Weakness. Psychiatric Present- Anxiety, Change in Sleep Pattern, Depression and Fearful. Not Present- Bipolar and Frequent crying. Endocrine Present- New Diabetes. Not Present- Cold Intolerance, Excessive Hunger, Hair Changes, Heat  Intolerance and Hot flashes. Hematology Present- Easy Bruising. Not Present- Excessive bleeding, Gland problems, HIV and Persistent Infections.   Physical Exam (Kalle Bernath A. Tanis Burnley MD; 08/31/2015 3:30 PM)  General Mental Status-Alert. General Appearance-Consistent with stated age. Hydration-Well hydrated. Voice-Normal.  Head and Neck Head-normocephalic, atraumatic with no lesions or palpable masses. Trachea-midline. Thyroid Gland Characteristics - normal size and consistency.  Chest and Lung Exam Chest and lung exam reveals -quiet, even and easy respiratory effort with no use of accessory muscles and on auscultation, normal breath sounds, no adventitious sounds and normal vocal resonance. Inspection Chest Wall - Normal. Back - normal.  Breast Breast - Left-Symmetric, Non Tender, No Biopsy scars, no Dimpling, No Inflammation, No Lumpectomy scars, No Mastectomy scars, No Peau d' Orange. Breast - Right-Symmetric, Non Tender, No Biopsy scars, no Dimpling, No Inflammation, No Lumpectomy scars, No Mastectomy scars, No Peau d' Orange. Breast Lump-No Palpable Breast Mass.  Cardiovascular Cardiovascular examination reveals -normal heart sounds, regular rate and rhythm with no murmurs and normal pedal pulses bilaterally.  Neurologic Neurologic evaluation reveals -alert and oriented x 3 with no impairment of recent or remote memory. Mental Status-Normal.  Musculoskeletal Normal Exam - Left-Upper Extremity Strength Normal and Lower Extremity Strength Normal. Normal Exam - Right-Upper Extremity Strength Normal and Lower Extremity Strength Normal.  Lymphatic Axillary  General Axillary Region: Bilateral - Description - Normal. Tenderness - Non Tender.    Assessment & Plan (Nickson Middlesworth A. Joshuwa Vecchio MD; 08/31/2015 3:32 PM)  DCIS (DUCTAL CARCINOMA IN SITU), RIGHT (D05.11) Impression: PATIENT REQUIRES GENETICS DUE TO STRONG FAMILY HISTORY. Patient desires right breast  lumpectomy and wasion.ed sentinel with the mapping due to microinvasion. Ifraer genetics are positive she will opt for bilateral nipple sparing mastectomy. Itioscwith her.h options as well as reconstrunstruction with her. Will await genetics and proceeded that point time depending on results. Risk of lumpectomy include bleeding, infection, seroma, more surgery, use of seed/wire, wound care, cosmetic deformity and the need for other treatments, death , blood clots, death. Pt agrees to proceed. Risk of sentinel lymph node mapping include bleeding, infection, lymphedema, shoulder pain. stiffness, dye allergy. cosmetic deformity , blood clots, death, need for more surgery. Pt agres to proceed. Discussed treatment options for breast cancer to include breast conservation vs mastectomy with reconstruction. Pt has decided on mastectomy. Risk include bleeding, infection, flap necrosis, pain, numbness, recurrence, hematoma, other surgery needs. Pt understands and agrees to proceed.  Current Plans Pt Education - [facility] Breast Cancer Information Given - [facility] "Breast Journey" Package  Pt Education - [facility] Breast Biopsy HCI: discussed with patient and provided information. Pt Education - [facility] Mastectomy HCI Pt Education - ABC (After Breast Cancer) Class Info: discussed with patient and provided information. You are being scheduled for surgery - Our schedulers will call you.  You should hear from our office's scheduling department within 5 working days about the location, date, and time of surgery. We try to make accommodations for patient's preferences in scheduling surgery, but sometimes the OR schedule or the surgeon's schedule prevents [place] from making those accommodations.  If you have not heard from our office [phone]) in 5 working days, call the office and ask for your surgeon's nurse.  If you have other questions about your diagnosis, plan, or surgery, call the office and ask for your surgeon's nurse.  Pt  Education - [facility]b breast cancer surgery: discussed with patient and provided information. The anatomy and the physiology was discussed. The pathophysiology and natural history of the disease was discussed. Options were discussed and recommendations were made. Technique, risks, benefits, & alternatives were discussed. Risks such as stroke, heart attack, bleeding, indection, death, and other risks discussed. Questions answered. The patient agrees to proceed. We discussed the staging and pathophysiology of breast cancer. We discussed all of the different options for treatment for breast cancer including surgery, chemotherapy, radiation therapy, Herceptin, and antiestrogen therapy. We discussed a sentinel lymph node biopsy as she does not appear to having lymph node involvement right now. We discussed the performance of that with injection of radioactive tracer and blue dye. We discussed that she would have an incision underneath her axillary hairline. We discussed that there is a bout a 10-20% chance of having a positive node with a sentinel lymph node biopsy and we will await the permanent pathology to make any other first further decisions in terms of her treatment. One of these options might be to return to the operating room to perform an axillary lymph node dissection. We discussed about a 1-2% risk lifetime of chronic shoulder pain as well as lymphedema associated with a sentinel lymph node biopsy. We discussed the options for treatment of the breast cancer which included lumpectomy versus a mastectomy. We discussed the performance of the lumpectomy with a wire placement. We discussed a 10-20% chance of a positive margin requiring reexcision in the operating room. We also discussed that she may need radiation therapy or antiestrogen therapy or both if she undergoes lumpectomy. We discussed the mastectomy and the postoperative care for that as well. We discussed that there is no difference in her survival  whether she undergoes lumpectomy with radiation therapy or antiestrogen therapy versus a mastectomy. There is a slight difference in the local recurrence rate being 3-5% with lumpectomy and about 1% with a mastectomy. We discussed the risks of operation including bleeding, infection, possible reoperation. She understands her further therapy will be based on what her stages at the time of her operation.

## 2015-09-05 ENCOUNTER — Telehealth: Payer: Self-pay | Admitting: *Deleted

## 2015-09-05 NOTE — Telephone Encounter (Signed)
Spoke to pt concerning [place] from 08/31/15. Denies questions or concerns regarding dx or treatment care plan. Relate she is having an "emotional" time with her dx and waiting for the genetic results. Validated feelings and gave emotional support and encouragement. Denies further needs at this time. Encourage pt to call with further needs. Received verbal understanding.

## 2015-09-06 ENCOUNTER — Ambulatory Visit: Payer: BC Managed Care – PPO | Admitting: Neurology

## 2015-09-08 ENCOUNTER — Other Ambulatory Visit: Payer: Self-pay | Admitting: Nurse Practitioner

## 2015-09-08 ENCOUNTER — Telehealth: Payer: Self-pay | Admitting: Genetic Counselor

## 2015-09-08 ENCOUNTER — Encounter: Payer: Self-pay | Admitting: Genetic Counselor

## 2015-09-08 DIAGNOSIS — Z1379 Encounter for other screening for genetic and chromosomal anomalies: Secondary | ICD-10-CM | POA: Insufficient documentation

## 2015-09-08 DIAGNOSIS — G4489 Other headache syndrome: Secondary | ICD-10-CM

## 2015-09-08 HISTORY — DX: Encounter for other screening for genetic and chromosomal anomalies: Z13.79

## 2015-09-08 NOTE — Telephone Encounter (Signed)
Revealed negative genetic testing on the Breast High/Moderate risk panel test.  Discussed that we have reflexed to the "Rest of Comprehensive Cancer panel".  That will be available in about 2-3 weeks.  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  I reported that I did not know enough about this to understand how,if at all, it would affect her treatment.  Suggested letting Dr. Jana Hakim and her surgeon know.

## 2015-09-08 NOTE — Telephone Encounter (Signed)
LM on VM with good news.  Left CB instructions. 

## 2015-09-09 ENCOUNTER — Other Ambulatory Visit: Payer: Self-pay | Admitting: Neurology

## 2015-09-12 NOTE — Telephone Encounter (Signed)
Primidone refill requested. Per last office note- patient to remain on medication. Refill approved and sent to patient's pharmacy.   

## 2015-09-19 ENCOUNTER — Telehealth: Payer: Self-pay | Admitting: Genetic Counselor

## 2015-09-19 NOTE — Telephone Encounter (Signed)
LM on VM with good news on the remainder of the genetic test results.  Left CB instructions.

## 2015-09-21 ENCOUNTER — Ambulatory Visit
Admission: RE | Admit: 2015-09-21 | Discharge: 2015-09-21 | Disposition: A | Discharge status: Home / Self Care | Payer: BC Managed Care – PPO | Source: Ambulatory Visit | Attending: Nurse Practitioner | Admitting: Nurse Practitioner

## 2015-09-21 DIAGNOSIS — G4489 Other headache syndrome: Secondary | ICD-10-CM

## 2015-09-22 DIAGNOSIS — Z87448 Personal history of other diseases of urinary system: Secondary | ICD-10-CM

## 2015-09-22 HISTORY — DX: Personal history of other diseases of urinary system: Z87.448

## 2015-09-27 ENCOUNTER — Ambulatory Visit: Payer: Self-pay | Admitting: Genetic Counselor

## 2015-09-27 DIAGNOSIS — C50311 Malignant neoplasm of lower-inner quadrant of right female breast: Secondary | ICD-10-CM

## 2015-09-27 DIAGNOSIS — Z800 Family history of malignant neoplasm of digestive organs: Secondary | ICD-10-CM

## 2015-09-27 DIAGNOSIS — Z803 Family history of malignant neoplasm of breast: Secondary | ICD-10-CM

## 2015-09-27 DIAGNOSIS — Z1379 Encounter for other screening for genetic and chromosomal anomalies: Secondary | ICD-10-CM

## 2015-09-27 NOTE — Progress Notes (Signed)
HPI: Ms. Casler was previously seen in the [place] clinic due to a personal and family history of cancer and concerns regarding a hereditary predisposition to cancer. Please refer to our prior cancer genetics clinic note for more information regarding Ms. Mcewen's medical, social and family histories, and our assessment and recommendations, at the time. Ms. Krabbenhoft recent genetic test results were disclosed to her, as were recommendations warranted by these results. These results and recommendations are discussed in more detail below.  FAMILY HISTORY:  We obtained a detailed, 4-generation family history.  Significant diagnoses are listed below: Family History  Problem Relation Age of Onset  . Hypertension Mother   . Heart disease Maternal Aunt   . Lung cancer Paternal Aunt   . Breast cancer Paternal Aunt     dx in her 1s  . Lung cancer Paternal Grandfather   . Colon cancer Paternal Uncle     dx in her 50s  . Colon cancer Paternal Uncle     dx in his 18s  . Melanoma Father 46  . Breast cancer Sister     dx in her 20s  . Lupus Sister   . COPD Maternal Uncle   . Colon cancer Paternal Uncle     dx in his 4s    The patient has two children who are cancer free. She has one brother and one sister. Her sister was diagnosed with breast cancer in her late 70s. Her father was diagnosed with melanoma in his late 50s. He had 10 siblings. Three brother had colon cancer, a sister had breast cancer and a brother had lung cancer. His father either had colon or lung cancer. There is no other reported family history of cancer. Patient's ancestors are of Caucasian descent. There is no reported Ashkenazi Jewish ancestry. There is no known consanguinity.  GENETIC TEST RESULTS: At the time of Ms. Kershaw's visit, we recommended she pursue genetic testing of the Breast High/Moderate risk gene panel, reflexing to the "rest of the Comprehensive cancer panel".  Ultimately, the full  testing would be all the genes on the Comprehensive cancer panel gene test. The Comprehensive Cancer Panel offered by [facility] includes sequencing and/or deletion duplication testing of the following 32 genes: APC, ATM, AXIN2, BARD1, BMPR1A, BRCA1, BRCA2, BRIP1, CDH1, CDK4, CDKN2A, CHEK2, EPCAM, FANCC, MLH1, MSH2, MSH6, MUTYH, NBN, PALB2, PMS2, POLD1, POLE, PTEN, RAD51C, RAD51D, SCG5/GREM1, SMAD4, STK11, TP53, VHL, and XRCC2.   The report date is September 12, 2015.  Genetic testing was normal, and did not reveal a deleterious mutation in these genes. The test report has been scanned into EPIC and is located under the Molecular Pathology section of the Results Review tab.   We discussed with Ms. Parkin that since the current genetic testing is not perfect, it is possible there may be a gene mutation in one of these genes that current testing cannot detect, but that chance is small. We also discussed, that it is possible that another gene that has not yet been discovered, or that we have not yet tested, is responsible for the cancer diagnoses in the family, and it is, therefore, important to remain in touch with cancer genetics in the future so that we can continue to offer Ms. Aird the most up to date genetic testing.   Genetic testing did detect a Variant of Unknown Significance in the POLD1 gene called c.[id number]. At this time, it is unknown if this variant is associated with increased cancer risk or if  this is a normal finding, but most variants such as this get reclassified to being inconsequential. It should not be used to make medical management decisions. With time, we suspect the lab will determine the significance of this variant, if any. If we do learn more about it, we will try to contact Ms. Mcswain to discuss it further. However, it is important to stay in touch with [place] periodically and keep the address and phone number up to date.   CANCER SCREENING RECOMMENDATIONS:This result is reassuring and  indicates that Ms. Wires likely does not have an increased risk for a future cancer due to a mutation in one of these genes. This normal test also suggests that Ms. Emmick's cancer was most likely not due to an inherited predisposition associated with one of these genes.  Most cancers happen by chance and this negative test suggests that her cancer falls into this category.  We, therefore, recommended she continue to follow the cancer management and screening guidelines provided by her oncology and primary healthcare provider.   RECOMMENDATIONS FOR FAMILY MEMBERS: Women in this family might be at some increased risk of developing cancer, over the general population risk, simply due to the family history of cancer. We recommended women in this family have a yearly mammogram beginning at age 63, or 73 years younger than the earliest onset of cancer, an an annual clinical breast exam, and perform monthly breast self-exams. Women in this family should also have a gynecological exam as recommended by their primary provider. All family members should have a colonoscopy by age 59.  FOLLOW-UP: Lastly, we discussed with Ms. Calvo that cancer genetics is a rapidly advancing field and it is possible that new genetic tests will be appropriate for her and/or her family members in the future. We encouraged her to remain in contact with cancer genetics on an annual basis so we can update her personal and family histories and let her know of advances in cancer genetics that may benefit this family.   Our contact number was provided. Ms. Batty questions were answered to her satisfaction, and she knows she is welcome to call us at anytime with additional questions or concerns.   Roma Kayser, MS, [facility] Certified Genetic Counselor [email or web address]

## 2015-09-29 ENCOUNTER — Other Ambulatory Visit: Payer: Self-pay | Admitting: Surgery

## 2015-09-29 DIAGNOSIS — D0511 Intraductal carcinoma in situ of right breast: Secondary | ICD-10-CM

## 2015-10-06 NOTE — Progress Notes (Signed)
The patients chart, PHM and current symptoms d/w anesthesia and per Dr. Deatra Canter, the patients breast surgery with Dr. Brantley Stage should be done at the main OR at [facility].

## 2015-10-10 ENCOUNTER — Ambulatory Visit
Admission: RE | Admit: 2015-10-10 | Discharge: 2015-10-10 | Disposition: A | Discharge status: Home / Self Care | Payer: BC Managed Care – PPO | Source: Ambulatory Visit | Attending: Surgery | Admitting: Surgery

## 2015-10-10 DIAGNOSIS — D0511 Intraductal carcinoma in situ of right breast: Secondary | ICD-10-CM

## 2015-10-11 NOTE — Progress Notes (Addendum)
Anesthesia PAT Evaluation: Patient is a 49 year old female scheduled for radioactive seed guided partial mastectomy with axillary sentinel LN biopsy on 10/13/15. She is s/p seed on 10/10/15.  Procedure was initially scheduled at [facility], but anesthesiologist Dr. Deatra Canter felt the patient should be done at the Main OR due to her past medical history (see below).   History includes right breast cancer (DCIS), non-smoker, post-operative N/V, fibromyalgia, depression, anxiety, OSA with CPAP use (a bedtime and with naps), HTN, obesity, DM2 (diet controlled), HLD, arthritis, hysterectomy, bone graft iliac crest, cholecystectomy, bladder surgery. She has pending evaluation with [facility] for evaluation of 2 mm ACA aneurysm and work-up for progressive tremors, balance issues, and decreased visual acuity since 10/2014. She has had two recent admissions as follows:  - 07/17/15-07/20/15 [facility]) for occipital headaches with hypertensive urgency (198/111) and chest numbness. She denied chest pain and SOB. Troponin was found to be elevated (peak 0.19), felt likely related to demand ischemia. LP ruled out SAH, and head CT was negative. HTN was treated with antihypertensives. She was evaluated by cardiologist Dr. Harl Bowie and stress test showed no evidence of ischemia or infarct (see below). He did not recommend additional cardiac testing. (Since her discharge, her PCP ordered a Brain MRA due to headaches with family history of ruptured brain aneurysm. MRA on 09/21/15 confirmed 2 mm anterior communicating artery region aneurysm.) - 09/22/15-09/25/15 [facility]) for acute renal failure. Following her MRA, she developed urinary symptoms with hematuria. She was treated for UTI with Septra. Within a few days, she noticed decreased urine output and an overnight weight gain of four pounds. 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 She was admitted, hydrated, medications adjusted, and seen by nephrologist Dr. Hinda Lenis. Reportedly, her out-patient follow-up Cr on 12/10/14 was 1.13. She now denies any fever, gross hematuria, difficultly urinating.   PCP is listed as Delia Chimes, NP. HEM-ONC is Dr. Jana Hakim. Cardiologist Dr. Harl Bowie. She has seen neurologist Dr. Carles Collet in the past, but has a pending appointment with [facility] for evaluation on newly diagnosed brain aneurysm and for work-up to evaluate for possible multiple sclerosis. She has a strong family history of autoimmune diseases.  Meds include clonazepam, Prozac, Norco, losartan, Nsacort, Vitamin D & E, trazodone. Recently taken off HCTZ, Neurontin, and metformin due to ARF.   PAT Vitals: BP 135/90, HR 77, RR 16, T 36.8C, O2 sat 99%.  Exam shows a pleasant white female in NAD. Heart RRR, no murmur noted. No carotid bruits noted. Lungs clear. Trace pre-tibial edema.   07/18/15 EKG: SB at 53 bpm, minimal voltage criteria for LVH, may be normal variant.  07/19/15 Nuclear stress test:   There was no ST segment deviation noted during stress after Lexiscan injection  The study is normal. There are no perfusion defects consistent with ischemia or prior infarct.  This is a low risk study.  Nuclear stress EF: 63%.  07/18/15 Echo: Study Conclusions - Left ventricle: The cavity size was normal. Wall thickness was normal. Systolic function was normal. The estimated ejection fraction was in the range of 60% to 65%. Wall motion was normal; there were no regional wall motion abnormalities. Left ventricular diastolic function parameters were normal. - Aortic valve: Valve area (VTI): 2.67 cm^2. Valve area (Vmax): 3.29 cm^2. - Atrial septum: No defect or patent foramen ovale was identified. -  Technically adequate study.  10/12/15 MRA Head w/o contrast: IMPRESSION: 1. No specific explanation for headache. 2. 2 mm anterior communicating  artery region aneurysm.  07/18/15 CXR: IMPRESSION: No active cardiopulmonary disease.  Preoperative labs noted. Cr 1.07. Glucose 105. CBC WNL.   Patient with recent cardiology testing. Her acute renal failure has resolved. She does have pending neurology evaluation, but is not having any acute exacerbation of her symptoms that started nearly a year ago. If no acute changes then I anticipate that she can proceed as planned. Of note, patient says she is dependent on her CPAP (even with naps)--she is bringing her mask.  [facility] Short Stay Center/Anesthesiology Phone [phone] 10/12/2015 1:56 PM

## 2015-10-12 ENCOUNTER — Other Ambulatory Visit (HOSPITAL_COMMUNITY): Payer: Self-pay | Admitting: *Deleted

## 2015-10-12 ENCOUNTER — Encounter (HOSPITAL_COMMUNITY)
Admission: RE | Admit: 2015-10-12 | Discharge: 2015-10-12 | Disposition: A | Discharge status: Home / Self Care | Payer: BC Managed Care – PPO | Source: Ambulatory Visit | Attending: Surgery | Admitting: Surgery

## 2015-10-12 ENCOUNTER — Encounter (HOSPITAL_COMMUNITY): Payer: Self-pay

## 2015-10-12 VITALS — BP 135/90 | HR 77 | Temp 98.2°F | Resp 16 | Ht 68.0 in | Wt 219.1 lb

## 2015-10-12 DIAGNOSIS — K219 Gastro-esophageal reflux disease without esophagitis: Secondary | ICD-10-CM | POA: Diagnosis not present

## 2015-10-12 DIAGNOSIS — G4733 Obstructive sleep apnea (adult) (pediatric): Secondary | ICD-10-CM | POA: Diagnosis not present

## 2015-10-12 DIAGNOSIS — D0511 Intraductal carcinoma in situ of right breast: Secondary | ICD-10-CM

## 2015-10-12 DIAGNOSIS — Z6833 Body mass index (BMI) 33.0-33.9, adult: Secondary | ICD-10-CM | POA: Diagnosis not present

## 2015-10-12 DIAGNOSIS — I10 Essential (primary) hypertension: Secondary | ICD-10-CM | POA: Diagnosis not present

## 2015-10-12 DIAGNOSIS — Z803 Family history of malignant neoplasm of breast: Secondary | ICD-10-CM | POA: Diagnosis not present

## 2015-10-12 DIAGNOSIS — F329 Major depressive disorder, single episode, unspecified: Secondary | ICD-10-CM | POA: Diagnosis not present

## 2015-10-12 DIAGNOSIS — E7800 Pure hypercholesterolemia, unspecified: Secondary | ICD-10-CM | POA: Diagnosis not present

## 2015-10-12 DIAGNOSIS — F419 Anxiety disorder, unspecified: Secondary | ICD-10-CM | POA: Diagnosis not present

## 2015-10-12 DIAGNOSIS — M797 Fibromyalgia: Secondary | ICD-10-CM | POA: Diagnosis not present

## 2015-10-12 DIAGNOSIS — Z79899 Other long term (current) drug therapy: Secondary | ICD-10-CM | POA: Diagnosis not present

## 2015-10-12 DIAGNOSIS — M1990 Unspecified osteoarthritis, unspecified site: Secondary | ICD-10-CM | POA: Diagnosis not present

## 2015-10-12 DIAGNOSIS — E119 Type 2 diabetes mellitus without complications: Secondary | ICD-10-CM | POA: Diagnosis not present

## 2015-10-12 HISTORY — DX: Cerebral aneurysm, nonruptured: I67.1

## 2015-10-12 HISTORY — DX: Adverse effect of unspecified anesthetic, initial encounter: T41.45XA

## 2015-10-12 HISTORY — DX: Other specified abnormal findings of blood chemistry: R79.89

## 2015-10-12 HISTORY — DX: Nausea with vomiting, unspecified: R11.2

## 2015-10-12 HISTORY — DX: Other abnormalities of gait and mobility: R26.89

## 2015-10-12 HISTORY — DX: Other specified postprocedural states: Z98.890

## 2015-10-12 HISTORY — DX: Tremor, unspecified: R25.1

## 2015-10-12 HISTORY — DX: Anxiety disorder, unspecified: F41.9

## 2015-10-12 HISTORY — DX: Constipation, unspecified: K59.00

## 2015-10-12 HISTORY — DX: Family history of other specified conditions: Z84.89

## 2015-10-12 HISTORY — DX: Gastro-esophageal reflux disease without esophagitis: K21.9

## 2015-10-12 HISTORY — DX: Headache: R51

## 2015-10-12 HISTORY — DX: Other complications of anesthesia, initial encounter: T88.59XA

## 2015-10-12 HISTORY — DX: Headache, unspecified: R51.9

## 2015-10-12 HISTORY — DX: Abnormal results of liver function studies: R94.5

## 2015-10-12 HISTORY — DX: Irritable bowel syndrome, unspecified: K58.9

## 2015-10-12 HISTORY — DX: Personal history of other diseases of the digestive system: Z87.19

## 2015-10-12 HISTORY — DX: Personal history of other diseases of urinary system: Z87.448

## 2015-10-12 LAB — CBC WITH DIFFERENTIAL/PLATELET
BASOS PCT: 0 %
Basophils Absolute: 0 10*3/uL (ref 0.0–0.1)
Eosinophils Absolute: 0.1 10*3/uL (ref 0.0–0.7)
Eosinophils Relative: 2 %
HEMATOCRIT: 39 % (ref 36.0–46.0)
HEMOGLOBIN: 13.4 g/dL (ref 12.0–15.0)
Lymphocytes Relative: 28 %
Lymphs Abs: 1.9 10*3/uL (ref 0.7–4.0)
MCH: 32.1 pg (ref 26.0–34.0)
MCHC: 34.4 g/dL (ref 30.0–36.0)
MCV: 93.5 fL (ref 78.0–100.0)
MONOS PCT: 9 %
Monocytes Absolute: 0.6 10*3/uL (ref 0.1–1.0)
NEUTROS ABS: 4.1 10*3/uL (ref 1.7–7.7)
NEUTROS PCT: 61 %
Platelets: 213 10*3/uL (ref 150–400)
RBC: 4.17 MIL/uL (ref 3.87–5.11)
RDW: 12.6 % (ref 11.5–15.5)
WBC: 6.6 10*3/uL (ref 4.0–10.5)

## 2015-10-12 LAB — COMPREHENSIVE METABOLIC PANEL
ALBUMIN: 3.7 g/dL (ref 3.5–5.0)
ALK PHOS: 59 U/L (ref 38–126)
ALT: 22 U/L (ref 14–54)
ANION GAP: 8 (ref 5–15)
AST: 20 U/L (ref 15–41)
BILIRUBIN TOTAL: 0.7 mg/dL (ref 0.3–1.2)
BUN: 11 mg/dL (ref 6–20)
CALCIUM: 9.5 mg/dL (ref 8.9–10.3)
CO2: 28 mmol/L (ref 22–32)
CREATININE: 1.07 mg/dL — AB (ref 0.44–1.00)
Chloride: 107 mmol/L (ref 101–111)
GFR calc Af Amer: >60 mL/min (ref >60)
GFR calc non Af Amer: 60 mL/min — ABNORMAL LOW (ref >60)
GLUCOSE: 105 mg/dL — AB (ref 65–99)
Potassium: 3.6 mmol/L (ref 3.5–5.1)
Sodium: 143 mmol/L (ref 135–145)
TOTAL PROTEIN: 7 g/dL (ref 6.5–8.1)

## 2015-10-12 LAB — GLUCOSE, CAPILLARY: Glucose-Capillary: 100 mg/dL — ABNORMAL HIGH (ref 65–99)

## 2015-10-12 MED ORDER — CHLORHEXIDINE GLUCONATE 4 % EX LIQD: 1.0000 "application " | Freq: Once | CUTANEOUS | Status: DC

## 2015-10-12 MED ORDER — DEXTROSE 5 % IV SOLN: 3.0000 g | INTRAVENOUS | Status: DC

## 2015-10-12 NOTE — Pre-Procedure Instructions (Signed)
Sarah Cooley  10/12/2015      Your procedure is scheduled on Thursday, October 13, 2015 at 1:05 PM.   Report to [facility] Entrance "A" Admitting Office at 11:00 AM.   Call this number if you have problems the morning of surgery: [phone]     Remember:  Do not eat food or drink liquids after midnight tonight.  Take these medicines the morning of surgery with A SIP OF WATER: Clonazepam (Klonopin), Fluoxetine (Prozac), Hydrocodone - if needed, Nasocort spray - if needed  Do not use Ibuprofen today and do not take Vitamin E tonight.   Do not wear jewelry, make-up or nail polish.  Do not wear lotions, powders, or perfumes.  You may wear deodorant.  Do not shave 48 hours prior to surgery.    Do not bring valuables to the hospital.  [facility] is not responsible for any belongings or valuables.  Contacts, dentures or bridgework may not be worn into surgery.  Leave your suitcase in the car.  After surgery it may be brought to your room.  For patients admitted to the hospital, discharge time will be determined by your treatment team.  Patients discharged the day of surgery will not be allowed to drive home.   Special instructions:  [facility] - Preparing for Surgery  Before surgery, you can play an important role.  Because skin is not sterile, your skin needs to be as free of germs as possible.  You can reduce the number of germs on you skin by washing with CHG (chlorahexidine gluconate) soap before surgery.  CHG is an antiseptic cleaner which kills germs and bonds with the skin to continue killing germs even after washing.  Please DO NOT use if you have an allergy to CHG or antibacterial soaps.  If your skin becomes reddened/irritated stop using the CHG and inform your nurse when you arrive at Short Stay.  Do not shave (including legs and underarms) for at least 48 hours prior to the first CHG shower.  You may shave your face.  Please follow these instructions  carefully:   1.  Shower with CHG Soap the night before surgery and the                                morning of Surgery.  2.  If you choose to wash your hair, wash your hair first as usual with your       normal shampoo.  3.  After you shampoo, rinse your hair and body thoroughly to remove the                      Shampoo.  4.  Use CHG as you would any other liquid soap.  You can apply chg directly       to the skin and wash gently with scrungie or a clean washcloth.  5.  Apply the CHG Soap to your body ONLY FROM THE NECK DOWN.        Do not use on open wounds or open sores.  Avoid contact with your eyes, ears, mouth and genitals (private parts).  Wash genitals (private parts) with your normal soap.  6.  Wash thoroughly, paying special attention to the area where your surgery        will be performed.  7.  Thoroughly rinse your body with warm water from the neck down.  8.  DO NOT shower/wash with your normal soap after using and rinsing off       the CHG Soap.  9.  Pat yourself dry with a clean towel.            10.  Wear clean pajamas.            11.  Place clean sheets on your bed the night of your first shower and do not        sleep with pets.  Day of Surgery  Do not apply any lotions/deodorants the morning of surgery.  Please wear clean clothes to the hospital.   Please read over the following fact sheets that you were given. Pain Booklet, Coughing and Deep Breathing and Surgical Site Infection Prevention

## 2015-10-12 NOTE — Progress Notes (Signed)
Pt denies cardiac history, chest pain or sob. Recent hospitalization in September she had an ECHO and Stress test, both normal.  Pt states she was just in [facility] first of December for acute renal failure. Questionably due to Septra that she was taking for a UTI. Requesting lab results and discharge summary from [facility].   Pt also states she had a Brain MRA on 09/21/15 and found to have 2 mm aneurysm. She also states that she is going to be seeing a physician at [facility] for possible MS. Having balance problems and numerous other symptoms that could be MS.   She states she is diabetic, checks her fasting blood sugar occasionally and it usually runs between 110-127. States her last A1C was some time back in the spring and it was "6.something". Not on any medication at this time. Her CBG today was 100.

## 2015-10-13 ENCOUNTER — Encounter (HOSPITAL_COMMUNITY)
Admission: RE | Admit: 2015-10-13 | Discharge: 2015-10-13 | Disposition: A | Discharge status: Home / Self Care | Payer: BC Managed Care – PPO | Source: Ambulatory Visit | Attending: Surgery | Admitting: Surgery

## 2015-10-13 ENCOUNTER — Encounter (HOSPITAL_COMMUNITY): Payer: Self-pay | Admitting: *Deleted

## 2015-10-13 ENCOUNTER — Ambulatory Visit
Admission: RE | Admit: 2015-10-13 | Discharge: 2015-10-13 | Disposition: A | Discharge status: Home / Self Care | Payer: BC Managed Care – PPO | Source: Ambulatory Visit | Attending: Surgery | Admitting: Surgery

## 2015-10-13 ENCOUNTER — Ambulatory Visit (HOSPITAL_COMMUNITY): Payer: BC Managed Care – PPO | Admitting: Certified Registered"

## 2015-10-13 ENCOUNTER — Encounter (HOSPITAL_COMMUNITY): Payer: BC Managed Care – PPO

## 2015-10-13 ENCOUNTER — Ambulatory Visit (HOSPITAL_BASED_OUTPATIENT_CLINIC_OR_DEPARTMENT_OTHER)
Admission: RE | Admit: 2015-10-13 | Discharge: 2015-10-13 | Disposition: A | Discharge status: Home / Self Care | Payer: BC Managed Care – PPO | Source: Ambulatory Visit | Attending: Surgery | Admitting: Surgery

## 2015-10-13 ENCOUNTER — Ambulatory Visit (HOSPITAL_COMMUNITY): Payer: BC Managed Care – PPO | Admitting: Vascular Surgery

## 2015-10-13 ENCOUNTER — Encounter (HOSPITAL_COMMUNITY)
Admission: RE | Disposition: A | Discharge status: Home / Self Care | Payer: Self-pay | Source: Ambulatory Visit | Attending: Surgery

## 2015-10-13 DIAGNOSIS — D0511 Intraductal carcinoma in situ of right breast: Secondary | ICD-10-CM | POA: Diagnosis not present

## 2015-10-13 DIAGNOSIS — G4733 Obstructive sleep apnea (adult) (pediatric): Secondary | ICD-10-CM | POA: Insufficient documentation

## 2015-10-13 DIAGNOSIS — F329 Major depressive disorder, single episode, unspecified: Secondary | ICD-10-CM | POA: Insufficient documentation

## 2015-10-13 DIAGNOSIS — E119 Type 2 diabetes mellitus without complications: Secondary | ICD-10-CM | POA: Insufficient documentation

## 2015-10-13 DIAGNOSIS — F419 Anxiety disorder, unspecified: Secondary | ICD-10-CM | POA: Insufficient documentation

## 2015-10-13 DIAGNOSIS — M797 Fibromyalgia: Secondary | ICD-10-CM | POA: Insufficient documentation

## 2015-10-13 DIAGNOSIS — M1990 Unspecified osteoarthritis, unspecified site: Secondary | ICD-10-CM | POA: Insufficient documentation

## 2015-10-13 DIAGNOSIS — I10 Essential (primary) hypertension: Secondary | ICD-10-CM | POA: Insufficient documentation

## 2015-10-13 DIAGNOSIS — E7800 Pure hypercholesterolemia, unspecified: Secondary | ICD-10-CM | POA: Insufficient documentation

## 2015-10-13 DIAGNOSIS — Z803 Family history of malignant neoplasm of breast: Secondary | ICD-10-CM | POA: Insufficient documentation

## 2015-10-13 DIAGNOSIS — Z6833 Body mass index (BMI) 33.0-33.9, adult: Secondary | ICD-10-CM | POA: Insufficient documentation

## 2015-10-13 DIAGNOSIS — K219 Gastro-esophageal reflux disease without esophagitis: Secondary | ICD-10-CM | POA: Insufficient documentation

## 2015-10-13 DIAGNOSIS — Z79899 Other long term (current) drug therapy: Secondary | ICD-10-CM | POA: Insufficient documentation

## 2015-10-13 HISTORY — PX: RADIOACTIVE SEED GUIDED PARTIAL MASTECTOMY WITH AXILLARY SENTINEL LYMPH NODE BIOPSY: SHX6520

## 2015-10-13 LAB — GLUCOSE, CAPILLARY
GLUCOSE-CAPILLARY: 88 mg/dL (ref 65–99)
Glucose-Capillary: 131 mg/dL — ABNORMAL HIGH (ref 65–99)

## 2015-10-13 LAB — HEMOGLOBIN A1C
Hgb A1c MFr Bld: 5.6 % (ref 4.8–5.6)
Mean Plasma Glucose: 114 mg/dL

## 2015-10-13 SURGERY — RADIOACTIVE SEED GUIDED PARTIAL MASTECTOMY WITH AXILLARY SENTINEL LYMPH NODE BIOPSY
Anesthesia: General | Site: Breast | Laterality: Right

## 2015-10-13 MED ORDER — SUCCINYLCHOLINE CHLORIDE 20 MG/ML IJ SOLN
INTRAMUSCULAR | Status: AC
  Filled 2015-10-13: qty 1

## 2015-10-13 MED ORDER — HYDROMORPHONE HCL 1 MG/ML IJ SOLN
0.2500 mg | INTRAMUSCULAR | Status: DC | PRN
  Administered 2015-10-13 (×2): 0.5 mg via INTRAVENOUS

## 2015-10-13 MED ORDER — MEPERIDINE HCL 25 MG/ML IJ SOLN: 6.2500 mg | INTRAMUSCULAR | Status: DC | PRN

## 2015-10-13 MED ORDER — BUPIVACAINE-EPINEPHRINE (PF) 0.5% -1:200000 IJ SOLN
INTRAMUSCULAR | Status: DC | PRN
  Administered 2015-10-13: 30 mL

## 2015-10-13 MED ORDER — DEXAMETHASONE SODIUM PHOSPHATE 4 MG/ML IJ SOLN
INTRAMUSCULAR | Status: AC
  Filled 2015-10-13: qty 2

## 2015-10-13 MED ORDER — ARTIFICIAL TEARS OP OINT
TOPICAL_OINTMENT | OPHTHALMIC | Status: AC
  Filled 2015-10-13: qty 3.5

## 2015-10-13 MED ORDER — 0.9 % SODIUM CHLORIDE (POUR BTL) OPTIME
TOPICAL | Status: DC | PRN
  Administered 2015-10-13: 1000 mL

## 2015-10-13 MED ORDER — MIDAZOLAM HCL 2 MG/2ML IJ SOLN
INTRAMUSCULAR | Status: DC | PRN
  Administered 2015-10-13: 2 mg via INTRAVENOUS

## 2015-10-13 MED ORDER — CEFAZOLIN SODIUM-DEXTROSE 2-3 GM-% IV SOLR
INTRAVENOUS | Status: AC
  Filled 2015-10-13: qty 50

## 2015-10-13 MED ORDER — MIDAZOLAM HCL 2 MG/2ML IJ SOLN
INTRAMUSCULAR | Status: AC
  Filled 2015-10-13: qty 2

## 2015-10-13 MED ORDER — LACTATED RINGERS IV SOLN
INTRAVENOUS | Status: DC
  Administered 2015-10-13 (×2): via INTRAVENOUS

## 2015-10-13 MED ORDER — PROMETHAZINE HCL 25 MG/ML IJ SOLN: 6.2500 mg | INTRAMUSCULAR | Status: DC | PRN

## 2015-10-13 MED ORDER — LIDOCAINE HCL (CARDIAC) 20 MG/ML IV SOLN
INTRAVENOUS | Status: AC
  Filled 2015-10-13: qty 5

## 2015-10-13 MED ORDER — BUPIVACAINE-EPINEPHRINE 0.25% -1:200000 IJ SOLN
INTRAMUSCULAR | Status: DC | PRN
  Administered 2015-10-13: 15 mL

## 2015-10-13 MED ORDER — FENTANYL CITRATE (PF) 250 MCG/5ML IJ SOLN
INTRAMUSCULAR | Status: AC
  Filled 2015-10-13: qty 5

## 2015-10-13 MED ORDER — FENTANYL CITRATE (PF) 250 MCG/5ML IJ SOLN
INTRAMUSCULAR | Status: DC | PRN
  Administered 2015-10-13: 50 ug via INTRAVENOUS
  Administered 2015-10-13 (×2): 100 ug via INTRAVENOUS

## 2015-10-13 MED ORDER — PROPOFOL 10 MG/ML IV BOLUS
INTRAVENOUS | Status: AC
  Filled 2015-10-13: qty 20

## 2015-10-13 MED ORDER — CEFAZOLIN SODIUM-DEXTROSE 2-3 GM-% IV SOLR
2.0000 g | INTRAVENOUS | Status: AC
  Administered 2015-10-13: 2 g via INTRAVENOUS

## 2015-10-13 MED ORDER — SCOPOLAMINE 1 MG/3DAYS TD PT72
MEDICATED_PATCH | TRANSDERMAL | Status: AC
  Administered 2015-10-13: 13:00:00 via TOPICAL
  Filled 2015-10-13: qty 1

## 2015-10-13 MED ORDER — PHENYLEPHRINE HCL 10 MG/ML IJ SOLN
INTRAMUSCULAR | Status: DC | PRN
  Administered 2015-10-13: 80 ug via INTRAVENOUS

## 2015-10-13 MED ORDER — LIDOCAINE HCL (CARDIAC) 20 MG/ML IV SOLN
INTRAVENOUS | Status: DC | PRN
  Administered 2015-10-13: 30 mg via INTRATRACHEAL

## 2015-10-13 MED ORDER — GLYCOPYRROLATE 0.2 MG/ML IJ SOLN
INTRAMUSCULAR | Status: DC | PRN
  Administered 2015-10-13 (×2): 0.1 mg via INTRAVENOUS

## 2015-10-13 MED ORDER — OXYCODONE-ACETAMINOPHEN 5-325 MG PO TABS: 1.0000 | ORAL_TABLET | ORAL | Status: DC | PRN

## 2015-10-13 MED ORDER — PHENYLEPHRINE 40 MCG/ML (10ML) SYRINGE FOR IV PUSH (FOR BLOOD PRESSURE SUPPORT)
PREFILLED_SYRINGE | INTRAVENOUS | Status: AC
  Filled 2015-10-13: qty 10

## 2015-10-13 MED ORDER — ONDANSETRON HCL 4 MG/2ML IJ SOLN
INTRAMUSCULAR | Status: AC
  Filled 2015-10-13: qty 2

## 2015-10-13 MED ORDER — BUPIVACAINE-EPINEPHRINE (PF) 0.25% -1:200000 IJ SOLN
INTRAMUSCULAR | Status: AC
  Filled 2015-10-13: qty 30

## 2015-10-13 MED ORDER — ONDANSETRON HCL 4 MG/2ML IJ SOLN
INTRAMUSCULAR | Status: DC | PRN
  Administered 2015-10-13: 4 mg via INTRAVENOUS

## 2015-10-13 MED ORDER — METOPROLOL TARTRATE 1 MG/ML IV SOLN
INTRAVENOUS | Status: AC
  Filled 2015-10-13: qty 5

## 2015-10-13 MED ORDER — PROPOFOL 500 MG/50ML IV EMUL
INTRAVENOUS | Status: DC | PRN
  Administered 2015-10-13: 25 ug/kg/min via INTRAVENOUS

## 2015-10-13 MED ORDER — FENTANYL CITRATE (PF) 100 MCG/2ML IJ SOLN
INTRAMUSCULAR | Status: AC
  Filled 2015-10-13: qty 2

## 2015-10-13 MED ORDER — METOPROLOL TARTRATE 1 MG/ML IV SOLN
INTRAVENOUS | Status: DC | PRN
  Administered 2015-10-13 (×2): 1 mg via INTRAVENOUS

## 2015-10-13 MED ORDER — MIDAZOLAM HCL 2 MG/2ML IJ SOLN
2.0000 mg | Freq: Once | INTRAMUSCULAR | Status: AC
  Administered 2015-10-13: 2 mg via INTRAVENOUS
  Filled 2015-10-13: qty 2

## 2015-10-13 MED ORDER — EPHEDRINE SULFATE 50 MG/ML IJ SOLN
INTRAMUSCULAR | Status: DC | PRN
  Administered 2015-10-13: 10 mg via INTRAVENOUS
  Administered 2015-10-13: 15 mg via INTRAVENOUS
  Administered 2015-10-13: 10 mg via INTRAVENOUS
  Administered 2015-10-13: 15 mg via INTRAVENOUS

## 2015-10-13 MED ORDER — MIDAZOLAM HCL 2 MG/2ML IJ SOLN: 0.5000 mg | Freq: Once | INTRAMUSCULAR | Status: DC | PRN

## 2015-10-13 MED ORDER — FENTANYL CITRATE (PF) 100 MCG/2ML IJ SOLN
100.0000 ug | Freq: Once | INTRAMUSCULAR | Status: AC
  Administered 2015-10-13: 100 ug via INTRAVENOUS
  Filled 2015-10-13: qty 2

## 2015-10-13 MED ORDER — DEXAMETHASONE SODIUM PHOSPHATE 4 MG/ML IJ SOLN
INTRAMUSCULAR | Status: DC | PRN
  Administered 2015-10-13: 4 mg via INTRAVENOUS

## 2015-10-13 MED ORDER — HYDROMORPHONE HCL 1 MG/ML IJ SOLN
INTRAMUSCULAR | Status: DC
Start: 2015-10-13 — End: 2015-10-13
  Filled 2015-10-13: qty 1

## 2015-10-13 MED ORDER — GLYCOPYRROLATE 0.2 MG/ML IJ SOLN
INTRAMUSCULAR | Status: AC
  Filled 2015-10-13: qty 1

## 2015-10-13 MED ORDER — EPHEDRINE SULFATE 50 MG/ML IJ SOLN
INTRAMUSCULAR | Status: AC
  Filled 2015-10-13: qty 1

## 2015-10-13 MED ORDER — PROPOFOL 10 MG/ML IV BOLUS
INTRAVENOUS | Status: DC | PRN
  Administered 2015-10-13: 200 mg via INTRAVENOUS

## 2015-10-13 MED ORDER — TECHNETIUM TC 99M SULFUR COLLOID FILTERED
1.0000 | Freq: Once | INTRAVENOUS | Status: AC | PRN
  Administered 2015-10-13: 1 via INTRADERMAL

## 2015-10-13 SURGICAL SUPPLY — 50 items
APPLIER CLIP 9.375 MED OPEN (MISCELLANEOUS) ×2
APR CLP MED 9.3 20 MLT OPN (MISCELLANEOUS) ×1
BINDER BREAST LRG (GAUZE/BANDAGES/DRESSINGS) IMPLANT
BINDER BREAST XLRG (GAUZE/BANDAGES/DRESSINGS) IMPLANT
BINDER BREAST XXLRG (GAUZE/BANDAGES/DRESSINGS) ×1 IMPLANT
BLADE SURG 15 STRL LF DISP TIS (BLADE) ×1 IMPLANT
BLADE SURG 15 STRL SS (BLADE) ×2
CANISTER SUCTION 2500CC (MISCELLANEOUS) ×2 IMPLANT
CHLORAPREP W/TINT 26ML (MISCELLANEOUS) ×2 IMPLANT
CLIP APPLIE 9.375 MED OPEN (MISCELLANEOUS) ×1 IMPLANT
CONT SPEC 4OZ CLIKSEAL STRL BL (MISCELLANEOUS) ×5 IMPLANT
COVER PROBE W GEL 5X96 (DRAPES) ×2 IMPLANT
COVER SURGICAL LIGHT HANDLE (MISCELLANEOUS) ×2 IMPLANT
DEVICE DUBIN SPECIMEN MAMMOGRA (MISCELLANEOUS) ×2 IMPLANT
DRAPE CHEST BREAST 15X10 FENES (DRAPES) ×2 IMPLANT
DRAPE UTILITY XL STRL (DRAPES) ×2 IMPLANT
ELECT CAUTERY BLADE 6.4 (BLADE) ×2 IMPLANT
ELECT REM PT RETURN 9FT ADLT (ELECTROSURGICAL) ×2
ELECTRODE REM PT RTRN 9FT ADLT (ELECTROSURGICAL) ×1 IMPLANT
GLOVE BIO SURGEON STRL SZ8 (GLOVE) ×4 IMPLANT
GLOVE BIOGEL PI IND STRL 8 (GLOVE) ×1 IMPLANT
GLOVE BIOGEL PI INDICATOR 8 (GLOVE) ×1
GOWN STRL REUS W/ TWL LRG LVL3 (GOWN DISPOSABLE) ×1 IMPLANT
GOWN STRL REUS W/ TWL XL LVL3 (GOWN DISPOSABLE) ×1 IMPLANT
GOWN STRL REUS W/TWL LRG LVL3 (GOWN DISPOSABLE) ×2
GOWN STRL REUS W/TWL XL LVL3 (GOWN DISPOSABLE) ×2
KIT BASIN OR (CUSTOM PROCEDURE TRAY) ×2 IMPLANT
KIT MARKER MARGIN INK (KITS) ×2 IMPLANT
LIQUID BAND (GAUZE/BANDAGES/DRESSINGS) ×1 IMPLANT
NDL HYPO 25GX1X1/2 BEV (NEEDLE) ×1 IMPLANT
NDL SAFETY ECLIPSE 18X1.5 (NEEDLE) IMPLANT
NEEDLE HYPO 18GX1.5 SHARP (NEEDLE)
NEEDLE HYPO 25GX1X1/2 BEV (NEEDLE) ×2 IMPLANT
NS IRRIG 1000ML POUR BTL (IV SOLUTION) IMPLANT
PACK SURGICAL SETUP 50X90 (CUSTOM PROCEDURE TRAY) ×2 IMPLANT
PAD ABD 8X10 STRL (GAUZE/BANDAGES/DRESSINGS) ×1 IMPLANT
PENCIL BUTTON HOLSTER BLD 10FT (ELECTRODE) ×2 IMPLANT
SPONGE LAP 18X18 X RAY DECT (DISPOSABLE) ×2 IMPLANT
SUT MNCRL AB 4-0 PS2 18 (SUTURE) ×4 IMPLANT
SUT SILK 2 0 SH (SUTURE) IMPLANT
SUT VIC AB 2-0 SH 27 (SUTURE) ×6
SUT VIC AB 2-0 SH 27XBRD (SUTURE) ×2 IMPLANT
SUT VIC AB 3-0 SH 27 (SUTURE) ×4
SUT VIC AB 3-0 SH 27X BRD (SUTURE) ×2 IMPLANT
SYR BULB 3OZ (MISCELLANEOUS) ×2 IMPLANT
SYR CONTROL 10ML LL (SYRINGE) ×2 IMPLANT
TOWEL OR 17X24 6PK STRL BLUE (TOWEL DISPOSABLE) ×2 IMPLANT
TOWEL OR 17X26 10 PK STRL BLUE (TOWEL DISPOSABLE) ×2 IMPLANT
TUBE CONNECTING 12X1/4 (SUCTIONS) ×2 IMPLANT
YANKAUER SUCT BULB TIP NO VENT (SUCTIONS) ×2 IMPLANT

## 2015-10-13 NOTE — Op Note (Signed)
Preoperative diagnosis: Stage I right breast cancer lower inner quadrant  Postop diagnosis: Same  Procedure: Right breast seed localized partial mastectomy with right axillary sentinel lymph node mapping  Surgeon: Erroll Luna M.D.  Anesthesia: LMA with 0.25% Marcaine with epinephrine and actual block  EBL: Minimal  Specimens: Right breast tissue received in clip in specimen additional medial, inferior and lateral margins sent separately and one axillary sentinel node hot  Drains: None  Indications for procedure: Patient is a 49 year old female with recently diagnosed right breast cancer. She was seen and evaluated in both breast conservation as well as mastectomy were discussed. Risks, benefits and alternative therapies were discussed as well as additional treatment of each.The procedure has been discussed with the patient. Alternatives to surgery have been discussed with the patient.  Risks of surgery include bleeding,  Infection,  Seroma formation, death,  and the need for further surgery.   The patient understands and wishes to proceed.Sentinel lymph node mapping and dissection has been discussed with the patient.  Risk of bleeding,  Infection,  Seroma formation,  Additional procedures,,  Shoulder weakness , lymphedema,  Shoulder stiffness,  Nerve and blood vessel injury and reaction to the mapping dyes have been discussed.  Alternatives to surgery have been discussed with the patient.  The patient agrees to proceed.  Description of procedure: The patient was met in the holding area and questions were answered. The neoprobe was used to verify proper seat function. She underwent injection by nuclear medicine technologist of technetium sulfur colloid for mapping. All questions were answered and the procedure was rereviewed with the patient as well as complications. She was taken back to the operating room placed upon the OR table. After induction of general  anesthesia, the right breast and  right armpit were prepped and draped in sterile fashion. Timeout was done to verify proper patient, procedure and site. Neoprobe was used and hotspot was identified in the right central to right lower inner quadrant. Curvilinear incision was made along the nipple complex on the inferior border. Neoprobe was used and all tissue around the clip and seed were excised. Radiograph revealed both the clip and seed to be in the specimen. I took additional medial, inferior lateral margins of these might be close. Cavity is irrigated clear. Closed with 3-0 Vicryl and 4-0 Monocryl.  Right axilla was interrogated. Neoprobe was used with the technetium settings at hotspot identified. 2 cm incision made in the right axilla after infiltration skin with 0.25% Marcaine with epinephrine. Dissection was carried down to the level I x-ray nodes. There was 1 hot nodes identified a background counts approached 0. Evidence of bleeding after removal was noted. Passed off the field to pathology. His closed 3-0 Vicryl for Monocryl. The cohesive applied to both incisions. Binder placed. All final counts sponge, needle and instruments found to be correct this portion case. Patient was awoke extubated taken to recovery in satisfactory condition.

## 2015-10-13 NOTE — Interval H&P Note (Signed)
History and Physical Interval Note:  10/13/2015 1:20 PM  Sarah Cooley  has presented today for surgery, with the diagnosis of RIGHT BREAST DCIS  The various methods of treatment have been discussed with the patient and family. After consideration of risks, benefits and other options for treatment, the patient has consented to  Procedure(s): RADIOACTIVE SEED GUIDED PARTIAL MASTECTOMY WITH AXILLARY SENTINEL LYMPH NODE BIOPSY (Right) as a surgical intervention .  The patient's history has been reviewed, patient examined, no change in status, stable for surgery.  I have reviewed the patient's chart and labs.  Questions were answered to the patient's satisfaction.     Pola Furno A.

## 2015-10-13 NOTE — Anesthesia Preprocedure Evaluation (Addendum)
Anesthesia Evaluation  Patient identified by MRN, date of birth, ID band Patient awake    Reviewed: Allergy & Precautions, NPO status , Patient's Chart, lab work & pertinent test results  History of Anesthesia Complications (+) PONV and history of anesthetic complications  Airway Mallampati: II  TM Distance: >3 FB Neck ROM: Full    Dental  (+) Dental Advisory Given   Pulmonary sleep apnea and Continuous Positive Airway Pressure Ventilation ,    breath sounds clear to auscultation       Cardiovascular hypertension, Pt. on medications (-) angina Rhythm:Regular Rate:Normal     Neuro/Psych  Headaches, Anxiety Depression Cerebral aneurysm    GI/Hepatic Neg liver ROS, GERD  Medicated and Controlled,  Endo/Other  diabetes (glu 88, diet management)Morbid obesity  Renal/GU Renal InsufficiencyRenal disease (resolved)     Musculoskeletal  (+) Arthritis , Osteoarthritis,  Fibromyalgia -  Abdominal (+) + obese,   Peds  Hematology   Anesthesia Other Findings Breast cancer  Reproductive/Obstetrics                            Anesthesia Physical Anesthesia Plan  ASA: III  Anesthesia Plan: General   Post-op Pain Management: GA combined w/ Regional for post-op pain   Induction: Intravenous  Airway Management Planned: LMA  Additional Equipment:   Intra-op Plan:   Post-operative Plan:   Informed Consent: I have reviewed the patients History and Physical, chart, labs and discussed the procedure including the risks, benefits and alternatives for the proposed anesthesia with the patient or authorized representative who has indicated his/her understanding and acceptance.   Dental advisory given  Plan Discussed with: CRNA and Surgeon  Anesthesia Plan Comments: (Plan routine monitors, GA- LMA OK, pectoralis block for post op analgesia)        Anesthesia Quick Evaluation

## 2015-10-13 NOTE — H&P (Signed)
H&P   CHARI PRASSE (MR# [id number])      H&P Info    Author Note Status Last Update User Last Update Date/Time   Erroll Luna, MD Signed Erroll Luna, MD  3:34 PM    H&P    Expand All Collapse All   Sarah Cooley  7:47 AM Location: [facility] Patient #: [id number] DOB: Apr 01, 1966 Undefined / Language: Cleophus Molt / Race: White Female  History of Present Illness Marcello Moores A. Caster Fayette MD;  Patient words: patient sent at the request of Dr Isidore Moos for mammographic abnormality in right breast. This has been present for the last 6 months and core biopsy was done due to change in the appearance of this.  high-grade DCIS with possible microinvasion found . Patient denies breast pain, breast mass or nipple discharge. 2 first-degree relatives with breast cancer noted.                 ADDITIONAL INFORMATION: PROGNOSTIC INDICATORS Results: IMMUNOHISTOCHEMICAL AND MORPHOMETRIC ANALYSIS PERFORMED MANUALLY Estrogen Receptor: 0%, NEGATIVE Progesterone Receptor: 0%, NEGATIVE COMMENT: The negative hormone receptor study(ies) in this case has an internal positive control. REFERENCE RANGE ESTROGEN RECEPTOR NEGATIVE 0% POSITIVE =>1% REFERENCE RANGE PROGESTERONE RECEPTOR NEGATIVE 0% POSITIVE =>1% All controls stained appropriately Enid Cutter MD Pathologist, Electronic Signature ( Signed 08/30/2015) FINAL DIAGNOSIS Diagnosis Breast, right, needle core biopsy, lower inner quadrant - AT LEAST HIGH DUCTAL CARCINOMA IN SITU SEE COMMENT. 1 of 2 FINAL for Sarah Cooley, Sarah Cooley HOPPER [id number]) Microscopic Comment Although the grade of tumor is best assessed at resection, with these biopsies, in situ carcinoma is grade 3. There is nodular densely hyalinized stroma associated with some of the mammary carcinoma. Although there are no definitive features of papillary lesion present, the differential includes involvement of a papillary lesion by in situ carcinoma and a  malignant papillary lesion. Smooth muscle myosin heavy chain, calponin and p63 immunostains were used in the diagnostic work-up of the case (blocks 1A and 1B). Breast prognostic studies are pending and will be reported in an addendum. Is reviewed with Dr. Gari Crown who concurs. (CRR:gt, 08/25/15) Mali RUND DO Pathologist, Electronic Signature (Case signed 08/25/2015) Specimen Gross and Clinical Information Specimen Comment In formalin 9:50, extracted <10 mins; focal asymmetry with several calcifications Specimen(s) Obtained: Breast, right, needle core biopsy, lower inner quadrant Specimen Clinical Information ? [place] [facility] Gross Received in formalin at 0950 hours (cold ischemia time less than 10 minutes) are multiple cores and irregular pieces of gray white to yellow soft tissue, 2.1 x 2 x 0.3 cm in aggregate, entirely submitted in two blocks. (SW:gt, 08/24/15) Stain(s) used in Diagnosis: The following stain(s) were used in diagnosing the case: Smooth Muscle Myosin - 1 Heavy Chain, PR-ACIS, Calponin, P63, ER-ACIS. The control(s) stained appropriately. Disclaimer PR progesterone receptor (PgR 636), immunohistochemical stains are performed on formalin fixed, paraffin embedded tissue using a 3,3"-diaminobenzidine (DAB) chromogen and DAKO Autostainer System. The staining intensity of the nucleus is scored morphometrically using the Automated Cellular Imaging System (ACIS) and is reported as the percentage of tumor cell nuclei demonstrating specific nuclear staining. Estrogen receptor (SP1), immunohistochemical stains are performed on formalin fixed, paraffin embedded tissue using a 3,3"-diaminobenzidine (DAB) chromogen and DAKO Autostainer System. The staining intensity of the nucleus is scored morphometrically using the Automated Cellular Imaging System (ACIS) and is reported as the percentage of tumor cell nuclei demonstrating specific nuclear staining. Some of these immunohistochemical stains may  have been developed and the       CLINICAL  DATA: Six-month follow-up for probably benign asymmetry within the lower inner quadrant of the right breast.  A stereotactic biopsy was attempted on 11/25/2014 but the asymmetry could not be reproduced at that time.  EXAM: DIGITAL DIAGNOSTIC RIGHT MAMMOGRAM WITH 3D TOMOSYNTHESIS WITH CAD  ULTRASOUND RIGHT BREAST  COMPARISON: Previous exams including diagnostic mammogram of 11/15/2014.  ACR Breast Density Category b: There are scattered areas of fibroglandular density.  FINDINGS: Bilateral CC and MLO views were obtained with tomosynthesis. With the benefit of tomosynthesis, the asymmetry is again identified within the lower inner quadrant of the right breast, at posterior depth, now masslike in appearance on tomosynthesis images and best seen on the tomosynthesis CC slice 17 and MLO slice 43. There are several new punctate calcifications within this asymmetry.  No new dominant masses, suspicious calcifications or secondary signs of malignancy within the remainder of the right breast.  Mammographic images were processed with CAD.  Targeted ultrasound is performed, showing no sonographic correlate for the right breast asymmetry. No suspicious solid or cystic mass is identified within the lower inner quadrant of the right breast.  IMPRESSION: Masslike asymmetry within the lower inner quadrant of the right breast, at posterior depth, with associated microcalcifications which are new. No sonographic correlate. Recommend stereotactic biopsy with tomosynthesis guidance.  RECOMMENDATION: Stereotactic-guided biopsy, with tomosynthesis guidance, for the masslike asymmetry in the lower inner quadrant of the right breast with associated microcalcifications.  Stereotactic guided biopsy is scheduled for Tuesday, November 1st at 9 a.m.  I have discussed the findings and recommendations with the patient. Results were also provided in  writing at the conclusion of the visit. If applicable, a reminder letter will be sent to the patient regarding the next appointment.  BI-RADS CATEGORY 4: Suspicious.   Electronically Signed By: Franki Cabot M.D. On: 08/19/2015 11:14.  The patient is a 49 year old female.   Other Problems Patsey Berthold, CMA;  Anxiety Disorder Arthritis Back Pain Bladder Problems Breast Cancer Chest pain Depression Gastroesophageal Reflux Disease General anesthesia - complications Hemorrhoids High blood pressure Hypercholesterolemia Lump In Breast Migraine Headache Pancreatitis Sleep Apnea  Past Surgical History Patsey Berthold, CMA;  Breast Biopsy Right. Gallbladder Surgery - Laparoscopic Hysterectomy (not due to cancer) - Partial Knee Surgery Right. Oral Surgery  Diagnostic Studies History Patsey Berthold, CMA;  Colonoscopy never Mammogram within last year Pap Smear >5 years ago  Medication History Patsey Berthold, [place];  No Current Medications Medications Reconciled  Social History Patsey Berthold, [place];  Alcohol use Occasional alcohol use. Caffeine use Coffee. Tobacco use Never smoker.  Family History Patsey Berthold, [place]; 08/31/2015 7:47 AM) Alcohol Abuse Family Members In General. Anesthetic complications Mother. Arthritis Family Members In General, Mother. Bleeding disorder Mother. Breast Cancer Family Members In General, Sister. Cancer Family Members In General. Cerebrovascular Accident Family Members In General, Mother. Colon Cancer Family Members In General. Colon Polyps Family Members In General. Depression Family Members In General, Mother, Son. Diabetes Mellitus Family Members In General. Heart Disease Family Members In General, Father, Sister. Hypertension Family Members In General, Father, Sister. Ischemic Bowel Disease Family Members In General. Melanoma Father. Respiratory Condition Family Members In General,  Mother.  Pregnancy / Birth History Patsey Berthold, [place]; [birth year] Age at menarche 68 years. Age of menopause <45 Contraceptive History Oral contraceptives. Gravida 3 Irregular periods Maternal age 1-25 Para 2     Review of Systems Patsey Berthold  General Present- Appetite Loss, Fatigue and Weight Gain. Not Present- Chills, Fever, Night Sweats and Weight Loss. Skin  Present- Change in Wart/Mole and Dryness. Not Present- Hives, Jaundice, New Lesions, Non-Healing Wounds, Rash and Ulcer. HEENT Present- Earache, Hearing Loss, Ringing in the Ears, Seasonal Allergies, Visual Disturbances and Wears glasses/contact lenses. Not Present- Hoarseness, Nose Bleed, Oral Ulcers, Sinus Pain, Sore Throat and Yellow Eyes. Respiratory Present- Snoring and Wheezing. Not Present- Bloody sputum, Chronic Cough and Difficulty Breathing. Breast Not Present- Breast Mass, Breast Pain, Nipple Discharge and Skin Changes. Cardiovascular Present- Swelling of Extremities. Not Present- Chest Pain, Difficulty Breathing Lying Down, Leg Cramps, Palpitations, Rapid Heart Rate and Shortness of Breath. Gastrointestinal Present- Change in Bowel Habits, Hemorrhoids and Nausea. Not Present- Abdominal Pain, Bloating, Bloody Stool, Chronic diarrhea, Constipation, Difficulty Swallowing, Excessive gas, Gets full quickly at meals, Indigestion, Rectal Pain and Vomiting. Female Genitourinary Not Present- Frequency, Nocturia, Painful Urination, Pelvic Pain and Urgency. Musculoskeletal Present- Back Pain, Joint Pain, Muscle Pain and Swelling of Extremities. Not Present- Joint Stiffness and Muscle Weakness. Neurological Present- Tremor and Trouble walking. Not Present- Decreased Memory, Fainting, Headaches, Numbness, Seizures, Tingling and Weakness. Psychiatric Present- Anxiety, Change in Sleep Pattern, Depression and Fearful. Not Present- Bipolar and Frequent crying. Endocrine Present- New Diabetes. Not Present- Cold Intolerance, Excessive  Hunger, Hair Changes, Heat Intolerance and Hot flashes. Hematology Present- Easy Bruising. Not Present- Excessive bleeding, Gland problems, HIV and Persistent Infections.   Physical Exam (Nycole Kawahara A. Othello Sgroi MD;   General Mental Status-Alert. General Appearance-Consistent with stated age. Hydration-Well hydrated. Voice-Normal.  Head and Neck Head-normocephalic, atraumatic with no lesions or palpable masses. Trachea-midline. Thyroid Gland Characteristics - normal size and consistency.  Chest and Lung Exam Chest and lung exam reveals -quiet, even and easy respiratory effort with no use of accessory muscles and on auscultation, normal breath sounds, no adventitious sounds and normal vocal resonance. Inspection Chest Wall - Normal. Back - normal.  Breast Breast - Left-Symmetric, Non Tender, No Biopsy scars, no Dimpling, No Inflammation, No Lumpectomy scars, No Mastectomy scars, No Peau d' Orange. Breast - Right-Symmetric, Non Tender, No Biopsy scars, no Dimpling, No Inflammation, No Lumpectomy scars, No Mastectomy scars, No Peau d' Orange. Breast Lump-No Palpable Breast Mass.  Cardiovascular Cardiovascular examination reveals -normal heart sounds, regular rate and rhythm with no murmurs and normal pedal pulses bilaterally.  Neurologic Neurologic evaluation reveals -alert and oriented x 3 with no impairment of recent or remote memory. Mental Status-Normal.  Musculoskeletal Normal Exam - Left-Upper Extremity Strength Normal and Lower Extremity Strength Normal. Normal Exam - Right-Upper Extremity Strength Normal and Lower Extremity Strength Normal.  Lymphatic Axillary  General Axillary Region: Bilateral - Description - Normal. Tenderness - Non Tender.    Assessment & Plan (Rhiann Boucher A. Corn  DCIS (DUCTAL CARCINOMA IN SITU), RIGHT (D05.11) Impression: PATIENT REQUIRES GENETICS DUE TO STRONG FAMILY HISTORY. Patient desires right breast lumpectomy and  sentinel with the mapping due to microinvasion. If HER  genetics are positive she will opt for bilateral nipple sparing mastectomy. Discussed  options as well as reconstrunstruction with her. Will await genetics and proceeded that point time depending on results. Risk of lumpectomy include bleeding, infection, seroma, more surgery, use of seed/wire, wound care, cosmetic deformity and the need for other treatments, death , blood clots, death. Pt agrees to proceed. Risk of sentinel lymph node mapping include bleeding, infection, lymphedema, shoulder pain. stiffness, dye allergy. cosmetic deformity , blood clots, death, need for more surgery. Pt agres to proceed. Discussed treatment options for breast cancer to include breast conservation vs mastectomy with reconstruction. Pt has decided on mastectomy. Risk include bleeding,  infection, flap necrosis, pain, numbness, recurrence, hematoma, other surgery needs. Pt understands and agrees to proceed.  Current Plans Pt Education - [facility] Breast Cancer Information Given - [employer] "Breast Journey" Package Pt Education - [facility] Breast Biopsy HCI: discussed with patient and provided information. Pt Education - [facility] Mastectomy HCI Pt Education - ABC (After Breast Cancer) Class Info: discussed with patient and provided information. You are being scheduled for surgery - Our schedulers will call you.  You should hear from our office's scheduling department within 5 working days about the location, date, and time of surgery. We try to make accommodations for patient's preferences in scheduling surgery, but sometimes the OR schedule or the surgeon's schedule prevents [place] from making those accommodations.  If you have not heard from our office [phone]) in 5 working days, call the office and ask for your surgeon's nurse.  If you have other questions about your diagnosis, plan, or surgery, call the office and ask for your surgeon's nurse.  Pt Education - [facility] breast cancer  surgery: discussed with patient and provided information. The anatomy and the physiology was discussed. The pathophysiology and natural history of the disease was discussed. Options were discussed and recommendations were made. Technique, risks, benefits, & alternatives were discussed. Risks such as stroke, heart attack, bleeding, indection, death, and other risks discussed. Questions answered. The patient agrees to proceed. We discussed the staging and pathophysiology of breast cancer. We discussed all of the different options for treatment for breast cancer including surgery, chemotherapy, radiation therapy, Herceptin, and antiestrogen therapy. We discussed a sentinel lymph node biopsy as she does not appear to having lymph node involvement right now. We discussed the performance of that with injection of radioactive tracer and blue dye. We discussed that she would have an incision underneath her axillary hairline. We discussed that there is a bout a 10-20% chance of having a positive node with a sentinel lymph node biopsy and we will await the permanent pathology to make any other first further decisions in terms of her treatment. One of these options might be to return to the operating room to perform an axillary lymph node dissection. We discussed about a 1-2% risk lifetime of chronic shoulder pain as well as lymphedema associated with a sentinel lymph node biopsy. We discussed the options for treatment of the breast cancer which included lumpectomy versus a mastectomy. We discussed the performance of the lumpectomy with a wire placement. We discussed a 10-20% chance of a positive margin requiring reexcision in the operating room. We also discussed that she may need radiation therapy or antiestrogen therapy or both if she undergoes lumpectomy. We discussed the mastectomy and the postoperative care for that as well. We discussed that there is no difference in her survival whether she undergoes lumpectomy  with radiation therapy or antiestrogen therapy versus a mastectomy. There is a slight difference in the local recurrence rate being 3-5% with lumpectomy and about 1% with a mastectomy. We discussed the risks of operation including bleeding, infection, possible reoperation. She understands her further therapy will be based on what her stages at the time of her operation.

## 2015-10-13 NOTE — Discharge Instructions (Signed)
[facility] °Office Phone Number [phone] ° °BREAST BIOPSY/ PARTIAL MASTECTOMY: POST OP INSTRUCTIONS ° °Always review your discharge instruction sheet given to you by the facility where your surgery was performed. ° °IF YOU HAVE DISABILITY OR FAMILY LEAVE FORMS, YOU MUST BRING THEM TO THE OFFICE FOR PROCESSING.  DO NOT GIVE THEM TO YOUR DOCTOR. ° °1. A prescription for pain medication may be given to you upon discharge.  Take your pain medication as prescribed, if needed.  If narcotic pain medicine is not needed, then you may take acetaminophen (Tylenol) or ibuprofen (Advil) as needed. °2. Take your usually prescribed medications unless otherwise directed °3. If you need a refill on your pain medication, please contact your pharmacy.  They will contact our office to request authorization.  Prescriptions will not be filled after 5pm or on week-ends. °4. You should eat very light the first 24 hours after surgery, such as soup, crackers, pudding, etc.  Resume your normal diet the day after surgery. °5. Most patients will experience some swelling and bruising in the breast.  Ice packs and a good support bra will help.  Swelling and bruising can take several days to resolve.  °6. It is common to experience some constipation if taking pain medication after surgery.  Increasing fluid intake and taking a stool softener will usually help or prevent this problem from occurring.  A mild laxative (Milk of Magnesia or Miralax) should be taken according to package directions if there are no bowel movements after 48 hours. °7. Unless discharge instructions indicate otherwise, you may remove your bandages 24-48 hours after surgery, and you may shower at that time.  You may have steri-strips (small skin tapes) in place directly over the incision.  These strips should be left on the skin for 7-10 days.  If your surgeon used skin glue on the incision, you may shower in 24 hours.  The glue will flake off over the  next 2-3 weeks.  Any sutures or staples will be removed at the office during your follow-up visit. °8. ACTIVITIES:  You may resume regular daily activities (gradually increasing) beginning the next day.  Wearing a good support bra or sports bra minimizes pain and swelling.  You may have sexual intercourse when it is comfortable. °a. You may drive when you no longer are taking prescription pain medication, you can comfortably wear a seatbelt, and you can safely maneuver your car and apply brakes. °b. RETURN TO WORK:  ______________________________________________________________________________________ °9. You should see your doctor in the office for a follow-up appointment approximately two weeks after your surgery.  Your doctor’s nurse will typically make your follow-up appointment when she calls you with your pathology report.  Expect your pathology report 2-3 business days after your surgery.  You may call to check if you do not hear from us after three days. °10. OTHER INSTRUCTIONS: _______________________________________________________________________________________________ _____________________________________________________________________________________________________________________________________ °_____________________________________________________________________________________________________________________________________ °_____________________________________________________________________________________________________________________________________ ° °WHEN TO CALL YOUR DOCTOR: °1. Fever over 101.0 °2. Nausea and/or vomiting. °3. Extreme swelling or bruising. °4. Continued bleeding from incision. °5. Increased pain, redness, or drainage from the incision. ° °The clinic staff is available to answer your questions during regular business hours.  Please don’t hesitate to call and ask to speak to one of the nurses for clinical concerns.  If you have a medical emergency, go to the nearest  emergency room or call 911.  A surgeon from [facility] is always on call at the hospital. ° °For further questions, please visit [email or web address]  °

## 2015-10-13 NOTE — Anesthesia Procedure Notes (Addendum)
Anesthesia Regional Block:  Pectoralis block  Pre-Anesthetic Checklist: ,, timeout performed, Correct Patient, Correct Site, Correct Laterality, Correct Procedure, Correct Position, site marked, Risks and benefits discussed,  Surgical consent,  Pre-op evaluation,  At surgeon's request and post-op pain management  Laterality: Right  Prep: chloraprep       Needles:  Injection technique: Single-shot  Needle Type: Echogenic Stimulator Needle     Needle Length: 9cm 9 cm Needle Gauge: 22 and 22 G    Additional Needles:  Procedures: ultrasound guided (picture in chart) Pectoralis block Narrative:  Start time: 10/13/2015 1:10 PM End time: 10/13/2015 1:15 PM Injection made incrementally with aspirations every 5 mL.  Performed by: Personally  Anesthesiologist: Glennon Mac, [place]  Additional Notes: Pt identified in Holding room.  Monitors applied. Working IV access confirmed. Sterile prep, drape R clavicle and chest.  #22ga ECHOgenic needle into sub-pec space between ribs 4,5 with US guidance.  30cc 0.5% Bupivacaine with 1:200k epi injected incrementally after negative test dose, good spread of local anesthetic.  Patient asymptomatic, VSS, no heme aspirated, tolerated well.  Jenita Seashore, MD   Procedure Name: LMA Insertion Date/Time: 10/13/2015 1:53 PM Performed by: Collier Bullock Pre-anesthesia Checklist: Patient identified, Emergency Drugs available, Suction available and Patient being monitored Patient Re-evaluated:Patient Re-evaluated prior to inductionOxygen Delivery Method: Circle system utilized Preoxygenation: Pre-oxygenation with 100% oxygen Intubation Type: IV induction Ventilation: Mask ventilation without difficulty LMA: LMA inserted LMA Size: 4.0 Number of attempts: 1 Placement Confirmation: positive ETCO2 and breath sounds checked- equal and bilateral Tube secured with: Tape

## 2015-10-13 NOTE — Anesthesia Postprocedure Evaluation (Signed)
Anesthesia Post Note  Patient: Sarah Cooley  Procedure(s) Performed: Procedure(s) (LRB): RADIOACTIVE SEED GUIDED PARTIAL MASTECTOMY WITH AXILLARY SENTINEL LYMPH NODE BIOPSY (Right)  Patient location during evaluation: PACU Anesthesia Type: General and Regional Level of consciousness: awake and alert, patient cooperative and oriented Pain management: pain level controlled Vital Signs Assessment: post-procedure vital signs reviewed and stable Respiratory status: spontaneous breathing, nonlabored ventilation and respiratory function stable Cardiovascular status: blood pressure returned to baseline and stable Postop Assessment: no signs of nausea or vomiting Anesthetic complications: no    Last Vitals:  Filed Vitals:   10/13/15 1643 10/13/15 1650  BP:  139/93  Pulse:    Temp: 36.8 C   Resp:  16    Last Pain:  Filed Vitals:   10/13/15 1655  PainSc: 6                  Layn Kye,E. Shuntia Exton

## 2015-10-13 NOTE — Transfer of Care (Signed)
Immediate Anesthesia Transfer of Care Note  Patient: Sarah Cooley  Procedure(s) Performed: Procedure(s): RADIOACTIVE SEED GUIDED PARTIAL MASTECTOMY WITH AXILLARY SENTINEL LYMPH NODE BIOPSY (Right)  Patient Location: PACU  Anesthesia Type:General  Level of Consciousness: awake, alert  and oriented  Airway & Oxygen Therapy: Patient Spontanous Breathing and Patient connected to face mask oxygen  Post-op Assessment: Report given to RN, Post -op Vital signs reviewed and stable and Patient moving all extremities X 4  Post vital signs: Reviewed and stable  Last Vitals:  Filed Vitals:   10/13/15 1330 10/13/15 1334  BP: 152/87 152/72  Pulse: 88   Temp:    Resp: 26     Complications: No apparent anesthesia complications

## 2015-10-14 ENCOUNTER — Encounter (HOSPITAL_COMMUNITY): Payer: Self-pay | Admitting: Surgery

## 2015-10-26 ENCOUNTER — Encounter (HOSPITAL_COMMUNITY): Payer: Self-pay

## 2015-11-15 ENCOUNTER — Other Ambulatory Visit (HOSPITAL_COMMUNITY): Payer: Self-pay | Admitting: Nurse Practitioner

## 2015-11-15 DIAGNOSIS — R9089 Other abnormal findings on diagnostic imaging of central nervous system: Secondary | ICD-10-CM

## 2015-11-16 ENCOUNTER — Ambulatory Visit (HOSPITAL_COMMUNITY)
Admission: RE | Admit: 2015-11-16 | Discharge: 2015-11-16 | Disposition: A | Discharge status: Home / Self Care | Payer: BC Managed Care – PPO | Source: Ambulatory Visit | Attending: Nurse Practitioner | Admitting: Nurse Practitioner

## 2015-11-16 DIAGNOSIS — R531 Weakness: Secondary | ICD-10-CM | POA: Diagnosis not present

## 2015-11-16 DIAGNOSIS — R55 Syncope and collapse: Secondary | ICD-10-CM | POA: Diagnosis present

## 2015-11-16 DIAGNOSIS — I671 Cerebral aneurysm, nonruptured: Secondary | ICD-10-CM | POA: Diagnosis not present

## 2015-11-16 DIAGNOSIS — R9089 Other abnormal findings on diagnostic imaging of central nervous system: Secondary | ICD-10-CM

## 2015-11-16 MED ORDER — GADOBENATE DIMEGLUMINE 529 MG/ML IV SOLN
20.0000 mL | Freq: Once | INTRAVENOUS | Status: AC | PRN
  Administered 2015-11-16: 20 mL via INTRAVENOUS

## 2015-11-24 ENCOUNTER — Telehealth: Payer: Self-pay | Admitting: *Deleted

## 2015-11-24 NOTE — Telephone Encounter (Signed)
Spoke with patient to follow up after start of radiation.  She states she is doing well except for some fatigue.  Encouraged her to call with any needs or concerns.

## 2015-11-28 ENCOUNTER — Other Ambulatory Visit (HOSPITAL_COMMUNITY): Payer: BC Managed Care – PPO

## 2015-11-29 ENCOUNTER — Other Ambulatory Visit (HOSPITAL_COMMUNITY): Payer: Self-pay | Admitting: Nurse Practitioner

## 2015-11-29 DIAGNOSIS — R221 Localized swelling, mass and lump, neck: Secondary | ICD-10-CM

## 2015-12-01 ENCOUNTER — Encounter (HOSPITAL_COMMUNITY): Payer: BC Managed Care – PPO | Attending: Hematology & Oncology | Admitting: Hematology & Oncology

## 2015-12-01 ENCOUNTER — Encounter (HOSPITAL_COMMUNITY): Payer: Self-pay | Admitting: Hematology & Oncology

## 2015-12-01 VITALS — BP 136/101 | HR 69 | Temp 97.5°F | Resp 20 | Wt 218.8 lb

## 2015-12-01 DIAGNOSIS — Z171 Estrogen receptor negative status [ER-]: Secondary | ICD-10-CM | POA: Diagnosis not present

## 2015-12-01 DIAGNOSIS — I10 Essential (primary) hypertension: Secondary | ICD-10-CM

## 2015-12-01 DIAGNOSIS — D0511 Intraductal carcinoma in situ of right breast: Secondary | ICD-10-CM

## 2015-12-01 DIAGNOSIS — Z803 Family history of malignant neoplasm of breast: Secondary | ICD-10-CM

## 2015-12-01 DIAGNOSIS — Z1379 Encounter for other screening for genetic and chromosomal anomalies: Secondary | ICD-10-CM

## 2015-12-01 DIAGNOSIS — C50311 Malignant neoplasm of lower-inner quadrant of right female breast: Secondary | ICD-10-CM

## 2015-12-01 NOTE — Progress Notes (Signed)
Sarah Chimes, NP [address] / [place] [id number]  DIAGNOSIS: Breast cancer of lower-inner quadrant of right female breast [facility])   Staging form: Breast, AJCC 7th Edition     Clinical stage from 08/31/2015: Stage 0 (Tis (DCIS), N0, M0) - Unsigned       Staging comments: Staged at breast conference on 11.9.16    SUMMARY OF ONCOLOGIC HISTORY:   Breast cancer of lower-inner quadrant of right female breast ([place])   08/19/2015 Breast US Masslike asymmetry within the lower inner quadrant of the right breast, at posterior depth, with associated microcalcifications which are new.    08/23/2015 Initial Biopsy AT LEAST HIGH DUCTAL CARCINOMA IN SITU   08/23/2015 Receptors her2 Estrogen Receptor: 0%, NEGATIVE Progesterone Receptor: 0%, NEGATIVE   08/26/2015 Initial Diagnosis Breast cancer of lower-inner quadrant of right female breast ([place])    CURRENT THERAPY:  INTERVAL HISTORY: Sarah Cooley 50 y.o. female returns for follow-up of R breast cancer.  History is as follows:  Sarah Cooley had bilateral screening mammography 11/02/2014 at the breast Center showing a possible area of asymmetry in the right breast. Right diagnostic mammography and ultrasonography genera 20 03/11/2015 found the breast density to be had a gray B. The area in the superior right breast noticed on screening mammography did not persist but there was another asymmetry in the medial right breast posteriorly which appeared more significant. This was not palpable. Ultrasound of this area was negative.  Stereotactic biopsy of the right breast area in question was attempted 11/25/2014 but with age in no definitive sonographic abnormality noted, and the area of asymmetry appearing less conspicuous, 6 month follow-up was suggested. This was performed 08/19/2015. Diagnostic right mammography with tomosynthesis and right breast ultrasonography on that date again found the area of asymmetry in the lower inner quadrant of the  right breast, now more masslike in appearance (until tomosynthesis. Ultrasound again was unhelpful.  Stereotactic biopsy of the right breast mass in question was performed 08/23/2015 and showed ([id number]) ductal carcinoma in situ, high-grade, possibly involving a papilloma, with areas suspicious for microinvasion. The cells were estrogen and progesterone receptor negative.  Sarah Cooley is accompanied by her husband. She has previously met with Dr. Jana Cooley and is not on any therapy. They spoke at length about her diagnosis. She has also met with Dr. Isidore Cooley of radiation oncology.  Reports a knot came up on her neck and has not gone away. Notes that the knot has moved down a bit. It came up on the night of her 7th radiation treatment. She did not have an ear infection at the time. She has 14 or 15 more radiation treatments to go. She has an ultrasound set up to evaluate the enlarged lymph node on her neck here at [facility] tomorrow. She also has an oblong lump that came up on her right breast. Dr. Isidore Cooley is aware of this breast mass and is monitoring it closely.  She previously had Epstein-Barr. She has issues with balance and fell about 5 to 6 times last year. She has followed up with neurology about this. Diagnosed with fibromyalgia 11 years ago. She is unable to walk straight or think straight. She loses her train of thought often. Her hand tremors make writing difficult.  She had a hysterectomy in her last 75s, they did not take her ovaries. She is pre-menopausal.  In January 2016, her hands tremors and fatigue worsened. She had an episode of head pain that was a  20 out of 10 on pain scale, this was found to be a brain aneurysm.  She was in [place] on 09/22/15 for acute renal failure with a UTI. She is not one who typically gets UTIs. She was taken off all of her medicines while in the hospital.  She still has her tonsils. She has sleep apnea and uses her CPAP machine nightly.  Her  blood pressure has been elevated over the last week. She began a blood pressure medication yesterday.  She has not worked since December 21st. 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  The last blood work she had performed was during her surgery.  Reports chronic fatigue. She has trouble getting her feet on the floor in the morning and walking.    MEDICAL HISTORY: Past Medical History  Diagnosis Date  . Fibromyalgia   . Depression   . Hypertension   . Hyperlipidemia   . Breast cancer ([place])   . Fibromyalgia   . Arthritis   . Hyperlipemia   . Family history of breast cancer   . Family history of colon cancer   . Diabetes mellitus without complication ([place])     not on medication at this time  . Occasional tremors   . Balance problem   . OSA (obstructive sleep apnea)     uses cpap with humidification  . Anxiety   . H/O acute renal failure 09/22/15    admitted to [facility]  . History of hiatal hernia   . GERD (gastroesophageal reflux disease)     none recent  . Headache     occipital and temporal  . Elevated liver function tests   . Irritable bowel syndrome (IBS)   . Constipation   . Complication of anesthesia     heart rate drops and O2 sats drop  . PONV (postoperative nausea and vomiting)   . Family history of adverse reaction to anesthesia     mom has n/v  . Brain aneurysm     2 mm ACA region aneurysm by 09/21/15 MRA    has Headache; Elevated troponin; Hypertension; Obesity; Pain in the chest; Cephalalgia; Breast cancer of lower-inner quadrant of right female breast ([place]); Family history of breast cancer; Family history of colon cancer; and Genetic testing on her problem list.     is allergic to shrimp; bactrim; erythromycin; minocycline hcl; ramipril; adhesive; drixoral; and sinutab.  @MEDADMINPROSE @  SURGICAL HISTORY: Past Surgical History  Procedure Laterality Date  .  Bone graft hip iliac crest    . Abdominal hysterectomy    . Bladder surgery    . Knee arthroscopy w/ acl reconstruction      right  . Cholecystectomy    . Fracture surgery Right     wrist  . Radioactive seed guided mastectomy with axillary sentinel lymph node biopsy Right 10/13/2015    Procedure: RADIOACTIVE SEED GUIDED PARTIAL MASTECTOMY WITH AXILLARY SENTINEL LYMPH NODE BIOPSY;  Surgeon: Erroll Luna, MD;  Location: [facility] OR;  Service: General;  Laterality: Right;    SOCIAL HISTORY: Social History   Social History  . Marital Status: Married    Spouse Name: Psychologist, clinical  . Number of Children: 2  . Years of Education: N/A   Occupational History  . physician office    Social History Main Topics  . Smoking status: Never Smoker   . Smokeless tobacco: Never Used  . Alcohol  Use: 0.0 oz/week    0 Standard drinks or equivalent per week     Comment: 2-3 times a year  . Drug Use: No  . Sexual Activity: Yes   Other Topics Concern  . 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 The patient's husband, [name], is a Curator. The patient has 2 children from a prior marriage, Duard Larsen who lives in [place] and is disabled secondary to bipolar disorder; and Melvenia Needles, who lives in [place] and works as a Curator and equal treat him. The patient has 2 grandchildren and one on the way. [name] has a daughter from an earlier marriage Cyril Mourning who works as an Therapist, sports in [place]. Nehal attends a [employer].  GYNECOLOGIC HISTORY:  No LMP recorded. Patient has had a hysterectomy. Menarche age 10, first live birth at 64. The patient is GX P2. She underwent partial hysterectomy, without salpingo-oophorectomy, remotely. She did not take hormone replacement. She did use oral contraceptives for approximately 3 years in her 81O, with no complications    FAMILY  HISTORY: Family History  Problem Relation Age of Onset  . Hypertension Mother   . Autoimmune disease Mother   . Eczema Mother   . Heart disease Maternal Aunt   . Lung cancer Paternal Aunt   . Breast cancer Paternal Aunt     dx in her 74s  . Lung cancer Paternal Grandfather   . Colon cancer Paternal Uncle     dx in her 12s  . Colon cancer Paternal Uncle     dx in his 74s  . Melanoma Father 26  . Psoriasis Father   . Hypertension Father   . Heart disease Father   . Breast cancer Sister     dx in her 65s  . Lupus Sister   . COPD Maternal Uncle   . Colon cancer Paternal Uncle     dx in his 36s  The patient's parents are living, in their mid 5s. The patient has one brother, and one sister. On the paternal side, the patient's father had melanoma diagnosed in his 78s. The paternal grandfather, who died at the age of 92, had a history of lung and colon cancer. There are 2 uncles with colon cancer and one with lung cancer as well as one aunt on the paternal side with breast cancer diagnosed in her late 75s. The patient's only sister underwent lumpectomy at the age of 18, but the patient does not know the exact results.  Review of Systems  Constitutional: Positive for malaise/fatigue.       Wears glasses.  Chronic fatigue.  HENT: Negative.   Eyes: Negative.   Respiratory: Negative.   Cardiovascular: Negative.   Gastrointestinal: Negative.   Genitourinary: Negative.   Musculoskeletal: Positive for falls.       Falls from balance issues.  Skin: Negative.        Enlarged lymph node in the neck. Palpable abnormality in the right breast.  Neurological: Positive for tremors.       Hand tremor. Balance issues. Issues focusing (loses train of thought frequently)  Endo/Heme/Allergies: Negative.   Psychiatric/Behavioral: The patient is nervous/anxious.   All other systems reviewed and are negative. 14 point review of systems was performed and is negative except as detailed under history  of present illness and above   PHYSICAL EXAMINATION  ECOG PERFORMANCE STATUS: 1 - Symptomatic but  completely ambulatory  Filed Vitals:   12/01/15 1259  BP: 136/101  Pulse: 69  Temp: 97.5 F (36.4 C)  Resp: 20    Physical Exam  Constitutional: She is oriented to person, place, and time and well-developed, well-nourished, and in no distress.  Obese  HENT:  Head: Normocephalic and atraumatic.  Nose: Nose normal.  Mouth/Throat: Oropharynx is clear and moist. No oropharyngeal exudate.  Eyes: Conjunctivae and EOM are normal. Pupils are equal, round, and reactive to light. Right eye exhibits no discharge. Left eye exhibits no discharge. No scleral icterus.  Neck: Normal range of motion. Neck supple. No tracheal deviation present. No thyromegaly present.  Palpable fullness but it is hard to feel a discreet mass.  Cardiovascular: Normal rate, regular rhythm and normal heart sounds.  Exam reveals no gallop and no friction rub.   No murmur heard. Pulmonary/Chest: Effort normal and breath sounds normal. She has no wheezes. She has no rales.    Abdominal: Soft. Bowel sounds are normal. She exhibits no distension and no mass. There is no tenderness. There is no rebound and no guarding.  Musculoskeletal: Normal range of motion. She exhibits no edema.  Lymphadenopathy:    She has no cervical adenopathy.  Neurological: She is alert and oriented to person, place, and time. She has normal reflexes. No cranial nerve deficit. Gait normal. Coordination normal.  Skin: Skin is warm and dry. No rash noted.  Psychiatric: Mood, memory, affect and judgment normal.  Nursing note and vitals reviewed.   LABORATORY DATA: I have reviewed the data as listed. CBC    Component Value Date/Time   WBC 6.6 10/12/2015 1122   WBC 8.1 08/31/2015 1217   RBC 4.17 10/12/2015 1122   RBC 4.78 08/31/2015 1217   HGB 13.4 10/12/2015 1122   HGB 15.5 08/31/2015 1217   HCT 39.0 10/12/2015 1122   HCT 45.4 08/31/2015  1217   PLT 213 10/12/2015 1122   PLT 253 08/31/2015 1217   MCV 93.5 10/12/2015 1122   MCV 95.0 08/31/2015 1217   MCH 32.1 10/12/2015 1122   MCH 32.5 08/31/2015 1217   MCHC 34.4 10/12/2015 1122   MCHC 34.2 08/31/2015 1217   RDW 12.6 10/12/2015 1122   RDW 13.5 08/31/2015 1217   LYMPHSABS 1.9 10/12/2015 1122   LYMPHSABS 2.3 08/31/2015 1217   MONOABS 0.6 10/12/2015 1122   MONOABS 0.5 08/31/2015 1217   EOSABS 0.1 10/12/2015 1122   EOSABS 0.1 08/31/2015 1217   BASOSABS 0.0 10/12/2015 1122   BASOSABS 0.0 08/31/2015 1217   CMP     Component Value Date/Time   NA 143 10/12/2015 1122   NA 138 08/31/2015 1217   K 3.6 10/12/2015 1122   K 2.6* 08/31/2015 1217   CL 107 10/12/2015 1122   CO2 28 10/12/2015 1122   CO2 27 08/31/2015 1217   GLUCOSE 105* 10/12/2015 1122   GLUCOSE 114 08/31/2015 1217   BUN 11 10/12/2015 1122   BUN 10.3 08/31/2015 1217   CREATININE 1.07* 10/12/2015 1122   CREATININE 0.8 08/31/2015 1217   CALCIUM 9.5 10/12/2015 1122   CALCIUM 9.7 08/31/2015 1217   PROT 7.0 10/12/2015 1122   PROT 7.8 08/31/2015 1217   ALBUMIN 3.7 10/12/2015 1122   ALBUMIN 3.9 08/31/2015 1217   AST 20 10/12/2015 1122   AST 24 08/31/2015 1217   ALT 22 10/12/2015 1122   ALT 37 08/31/2015 1217   ALKPHOS 59 10/12/2015 1122   ALKPHOS 70 08/31/2015 1217   BILITOT 0.7 10/12/2015 1122  BILITOT 0.64 08/31/2015 1217   GFRNONAA 60* 10/12/2015 1122   GFRAA >60 10/12/2015 1122     RADIOGRAPHIC STUDIES: I have personally reviewed the radiological images as listed and agreed with the findings in the report.  Study Result     CLINICAL DATA: 50 year old female with new diagnosis of breast cancer. Syncope in weakness. Subsequent encounter. Small anterior communicating artery aneurysm on November MRA.  EXAM: MRI HEAD WITHOUT AND WITH CONTRAST  TECHNIQUE: Multiplanar, multiecho pulse sequences of the brain and surrounding structures were obtained without and with intravenous  contrast.  CONTRAST: 25m MULTIHANCE GADOBENATE DIMEGLUMINE 529 MG/ML IV SOLN  COMPARISON: Intracranial MRA 09/21/2015. Head CT without contrast 07/17/2015  FINDINGS: No midline shift, mass effect, or evidence of intracranial mass lesion. No abnormal enhancement identified. Small anterior right frontal lobe developmental venous anomaly (normal anatomic variant series 11, image 70). No dural thickening.  Cerebral volume is within normal limits. No restricted diffusion to suggest acute infarction. No ventriculomegaly, extra-axial collection or acute intracranial hemorrhage. Cervicomedullary junction and pituitary are within normal limits. Negative visualized cervical spine. GPearline Cablesand white matter signal is within normal limits for age throughout the brain. Major intracranial vascular flow voids are preserved.  Visible internal auditory structures appear normal. Mastoids are clear. Mild mostly left maxillary sinus paranasal sinus mucosal thickening. Negative orbit and scalp soft tissues. Visualized bone marrow signal is within normal limits.  IMPRESSION: 1. No acute or metastatic intracranial abnormality. 2. Tiny anterior communicating artery aneurysm described in November is only evident by MRA.   Electronically Signed  By: HGenevie AnnM.D.  On: 11/16/2015 08:50    PATHOLOGY:     ASSESSMENT and THERAPY PLAN:  High Grade DCIS R breast ER- PR- Negative Sentinel LN Right breast seed localized partial mastectomy with right axillary sentinel lymph node mapping Dr. CBrantley Stage12/22/2016 Adjuvant XRT  [place] As far as medical oncology follow-up she understands she is not a candidate for anti-estrogens and we do not do chemotherapy for ductal carcinoma in situ tumors. She does warrant follow-up and we generally see patients with ductal carcinoma in situ once a year for 5 years. She would [place] to our partner Dr. PWhitney Musethere, probably to see the patient February or so by which time all her local treatment should be completed.   Ms. HHeinybegan a new blood pressure medication yesterday. She is unsure of the name but probably [place]   I will look at the results of Ms. Blowers's neck lymph node ultrasound after it is performed tomorrow at 3 pm.  The patient will sign a release of records so we can see her previous blood results.   She will return in 1 month to answer any additional questions or concerns she may have and to get her on a reasonable follow-up schedule.  A total of 40 minutes was spent with the patient and her husband today in direct patient consultation and examination.   All questions were answered. The patient knows to call the clinic with any problems, questions or concerns. We can certainly see the patient much sooner if necessary.  This document serves as a record of services personally performed by SAncil Linsey MD. It was created on her behalf by EArlyce Harman a trained medical scribe. The creation of this record is based on the scribe's personal observations and the provider's statements to them.  This document has been checked and approved by the attending provider.  I have reviewed the above documentation for accuracy and completeness, and I agree with the above.  This note was electronically signed. Molli Hazard, MD  12/01/2015

## 2015-12-01 NOTE — Patient Instructions (Addendum)
[place] [facility] Discharge Instructions  RECOMMENDATIONS MADE BY THE CONSULTANT AND ANY TEST RESULTS WILL BE SENT TO YOUR REFERRING PHYSICIAN.   Exam and discussion by Dr Whitney Muse today Note for your husband for work Pathology report given Tomorrow for your ultrasound for your neck, we will follow up with that  We are going to get you to sign a release so we can get your records from Dr Woody Seller and [place] office.   Return to see the doctor in 1 month. Please call the clinic if you have any questions or concerns    Thank you for choosing [place] at [facility] to provide your oncology and hematology care.  To afford each patient quality time with our provider, please arrive at least 15 minutes before your scheduled appointment time.   Beginning January 23rd 2017 lab work for the [facility] will be done in the  Main lab at [facility] on 1st floor. If you have a lab appointment with the [facility] please come in thru the  Main Entrance and check in at the main information desk  You need to re-schedule your appointment should you arrive 10 or more minutes late.  We strive to give you quality time with our providers, and arriving late affects you and other patients whose appointments are after yours.  Also, if you no show three or more times for appointments you may be dismissed from the clinic at the providers discretion.     Again, thank you for choosing [facility].  Our hope is that these requests will decrease the amount of time that you wait before being seen by our physicians.       _____________________________________________________________  Should you have questions after your visit to [facility], please contact our office at [phone] between the hours of 8:30 a.m. and 4:30 p.m.  Voicemails left after 4:30 p.m. will not be returned until the following business day.  For prescription  refill requests, have your pharmacy contact our office.

## 2015-12-02 ENCOUNTER — Ambulatory Visit (HOSPITAL_COMMUNITY)
Admission: RE | Admit: 2015-12-02 | Discharge: 2015-12-02 | Disposition: A | Discharge status: Home / Self Care | Payer: BC Managed Care – PPO | Source: Ambulatory Visit | Attending: Nurse Practitioner | Admitting: Nurse Practitioner

## 2015-12-02 DIAGNOSIS — R590 Localized enlarged lymph nodes: Secondary | ICD-10-CM | POA: Diagnosis not present

## 2015-12-02 DIAGNOSIS — R221 Localized swelling, mass and lump, neck: Secondary | ICD-10-CM | POA: Diagnosis present

## 2015-12-10 ENCOUNTER — Encounter (HOSPITAL_COMMUNITY): Payer: Self-pay | Admitting: *Deleted

## 2015-12-10 ENCOUNTER — Emergency Department (HOSPITAL_COMMUNITY)
Admission: EM | Admit: 2015-12-10 | Discharge: 2015-12-11 | Disposition: A | Discharge status: Home / Self Care | Payer: BC Managed Care – PPO | Attending: Emergency Medicine | Admitting: Emergency Medicine

## 2015-12-10 DIAGNOSIS — Z79899 Other long term (current) drug therapy: Secondary | ICD-10-CM | POA: Insufficient documentation

## 2015-12-10 DIAGNOSIS — R51 Headache: Secondary | ICD-10-CM | POA: Insufficient documentation

## 2015-12-10 DIAGNOSIS — F329 Major depressive disorder, single episode, unspecified: Secondary | ICD-10-CM | POA: Diagnosis not present

## 2015-12-10 DIAGNOSIS — G4730 Sleep apnea, unspecified: Secondary | ICD-10-CM | POA: Insufficient documentation

## 2015-12-10 DIAGNOSIS — E119 Type 2 diabetes mellitus without complications: Secondary | ICD-10-CM | POA: Insufficient documentation

## 2015-12-10 DIAGNOSIS — I10 Essential (primary) hypertension: Secondary | ICD-10-CM | POA: Diagnosis not present

## 2015-12-10 DIAGNOSIS — M797 Fibromyalgia: Secondary | ICD-10-CM | POA: Insufficient documentation

## 2015-12-10 DIAGNOSIS — Z87448 Personal history of other diseases of urinary system: Secondary | ICD-10-CM | POA: Diagnosis not present

## 2015-12-10 DIAGNOSIS — R519 Headache, unspecified: Secondary | ICD-10-CM

## 2015-12-10 DIAGNOSIS — M1990 Unspecified osteoarthritis, unspecified site: Secondary | ICD-10-CM | POA: Diagnosis not present

## 2015-12-10 DIAGNOSIS — K5900 Constipation, unspecified: Secondary | ICD-10-CM | POA: Insufficient documentation

## 2015-12-10 DIAGNOSIS — Z853 Personal history of malignant neoplasm of breast: Secondary | ICD-10-CM | POA: Insufficient documentation

## 2015-12-10 DIAGNOSIS — Z9981 Dependence on supplemental oxygen: Secondary | ICD-10-CM | POA: Diagnosis not present

## 2015-12-10 DIAGNOSIS — F419 Anxiety disorder, unspecified: Secondary | ICD-10-CM | POA: Insufficient documentation

## 2015-12-10 MED ORDER — KETOROLAC TROMETHAMINE 30 MG/ML IJ SOLN
30.0000 mg | Freq: Once | INTRAMUSCULAR | Status: AC
  Administered 2015-12-10: 30 mg via INTRAVENOUS
  Filled 2015-12-10: qty 1

## 2015-12-10 MED ORDER — SODIUM CHLORIDE 0.9 % IV BOLUS (SEPSIS)
1000.0000 mL | Freq: Once | INTRAVENOUS | Status: AC
  Administered 2015-12-10: 1000 mL via INTRAVENOUS

## 2015-12-10 MED ORDER — DIPHENHYDRAMINE HCL 50 MG/ML IJ SOLN
25.0000 mg | Freq: Once | INTRAMUSCULAR | Status: AC
  Administered 2015-12-10: 25 mg via INTRAVENOUS
  Filled 2015-12-10: qty 1

## 2015-12-10 MED ORDER — CLONIDINE HCL 0.1 MG PO TABS
0.1000 mg | ORAL_TABLET | Freq: Once | ORAL | Status: DC
  Filled 2015-12-10: qty 1

## 2015-12-10 MED ORDER — METOCLOPRAMIDE HCL 5 MG/ML IJ SOLN
10.0000 mg | Freq: Once | INTRAMUSCULAR | Status: AC
  Administered 2015-12-10: 10 mg via INTRAVENOUS
  Filled 2015-12-10: qty 2

## 2015-12-10 NOTE — Discharge Instructions (Signed)
Blood pressure has come down. Follow-up your primary care doctor or cardiologist for further blood pressure recommendations.

## 2015-12-10 NOTE — ED Provider Notes (Signed)
CSN: [id number]     Arrival date & time 12/10/15  1843 History  By signing my name below, I, Hansel Feinstein, attest that this documentation has been prepared under the direction and in the presence of Nat Christen, MD. Electronically Signed: Hansel Feinstein, ED Scribe. 12/10/2015. 9:07 PM.    Chief Complaint  Patient presents with  . Headache   The history is provided by the patient. No language interpreter was used.   HPI Comments: Sarah Cooley is a 50 y.o. female with h/o HTN, HLD, DM, HA, breast cancer, brain aneurysm who presents to the Emergency Department complaining of moderate HA onset this afternoon at 12PM. Pt states she took 800 mg Ibuprofen with mild to moderate relief. She also notes that her BP has been elevated this month as high as 170/112. Pt reports she saw her PCP, Dr. Toy Cookey, earlier this month due to the elevated BP and was switched from Losartan HCTZ to 80 mg Edarbi. She states she followed up 11 days ago and was also prescribed Norvasc 5 mg in addition to the [place]. Pt also complains of occasional dizziness for the last month. Per pt, she has also had constant high-pitched tinnitus since last hospital admission 5 months ago for HA. Pt notes she was prescribed Septra and Pyridium 5 days ago for a UTI. 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   Past Medical History  Diagnosis Date  . Fibromyalgia   . Depression   . Hypertension   . Hyperlipidemia   . Breast cancer ([place])   . Fibromyalgia   . Arthritis   . Hyperlipemia   . Family history of breast cancer   . Family history of colon cancer   . Diabetes mellitus without complication ([place])     not on medication at this time  . Occasional tremors   . Balance problem   . OSA (obstructive sleep apnea)     uses cpap with humidification  . Anxiety   . H/O acute renal failure 09/22/15    admitted to [facility]  . History of hiatal hernia   . GERD (gastroesophageal reflux disease)     none recent  . Headache      occipital and temporal  . Elevated liver function tests   . Irritable bowel syndrome (IBS)   . Constipation   . Complication of anesthesia     heart rate drops and O2 sats drop  . PONV (postoperative nausea and vomiting)   . Family history of adverse reaction to anesthesia     mom has n/v  . Brain aneurysm     2 mm ACA region aneurysm by 09/21/15 M[facility]   Past Surgical History  Procedure Laterality Date  . Bone graft hip iliac crest    . Abdominal hysterectomy    . Bladder surgery    . Knee arthroscopy w/ acl reconstruction      right  . Cholecystectomy    . Fracture surgery Right     wrist  . Radioactive seed guided mastectomy with axillary sentinel lymph node biopsy Right 10/13/2015    Procedure: RADIOACTIVE SEED GUIDED PARTIAL MASTECTOMY WITH AXILLARY SENTINEL LYMPH NODE BIOPSY;  Surgeon: Erroll Luna, MD;  Location: [place];  Service: General;  Laterality: Right;   Family History  Problem Relation Age of Onset  . Hypertension Mother   . Autoimmune disease Mother   . Eczema Mother   . Heart disease Maternal Aunt   . Lung cancer Paternal Aunt   .  Breast cancer Paternal Aunt     dx in her 71s  . Lung cancer Paternal Grandfather   . Colon cancer Paternal Uncle     dx in her 44s  . Colon cancer Paternal Uncle     dx in his 31s  . Melanoma Father 86  . Psoriasis Father   . Hypertension Father   . Heart disease Father   . Breast cancer Sister     dx in her 57s  . Lupus Sister   . COPD Maternal Uncle   . Colon cancer Paternal Uncle     dx in his 16s   Social History  Substance Use Topics  . Smoking status: Never Smoker   . Smokeless tobacco: Never Used  . Alcohol Use: 0.0 oz/week    0 Standard drinks or equivalent per week     Comment: 2-3 times a year   OB History    No data available     Review of Systems A complete 10 system review of systems was obtained and all systems are negative except as noted in the HPI and PMH.    Allergies  Shrimp; Bactrim;  Erythromycin; Minocycline hcl; Ramipril; Adhesive; Drixoral; and Sinutab  Home Medications   Prior to Admission medications   Medication Sig Start Date End Date Taking? Authorizing Provider  Aloe Vera GEL Apply 1 application topically daily as needed (for skin-right breast).   Yes Historical Provider, MD  amLODipine (NORVASC) 5 MG tablet Take 5 mg by mouth daily. 12/07/15  Yes Historical Provider, MD  Azilsartan Medoxomil (EDARBI) 80 MG TABS Take 80 mg by mouth daily.   Yes Historical Provider, MD  clonazePAM (KLONOPIN) 1 MG tablet Take 1 mg by mouth See admin instructions. Take 1 tablet (1 mg) by mouth every morning and at night, may take an additional tablet mid-day if needed for anxiety   Yes Historical Provider, MD  docusate sodium (COLACE) 100 MG capsule Take 100 mg by mouth daily as needed for mild constipation.   Yes Historical Provider, MD  HYDROcodone-acetaminophen (NORCO/VICODIN) 5-325 MG per tablet Take 0.5 tablets by mouth every 6 (six) hours as needed for moderate pain. Reported on 12/10/2015 05/10/15  Yes Historical Provider, MD  ibuprofen (ADVIL,MOTRIN) 200 MG tablet Take 800 mg by mouth 2 (two) times daily as needed (pain).    Yes Historical Provider, MD  oxyCODONE-acetaminophen (ROXICET) 5-325 MG tablet Take 1-2 tablets by mouth every 4 (four) hours as needed. Patient taking differently: Take 1-2 tablets by mouth every 4 (four) hours as needed for moderate pain or severe pain.  10/13/15  Yes Erroll Luna, MD  traZODone (DESYREL) 50 MG tablet Take 150 mg by mouth at bedtime.  09/06/15  Yes Historical Provider, MD  triamcinolone (NASACORT ALLERGY 24HR) 55 MCG/ACT AERO nasal inhaler Place 2 sprays into both nostrils daily as needed (Allergies).    Yes Historical Provider, MD  Vitamin D, Ergocalciferol, (DRISDOL) 50000 UNITS CAPS capsule Take 50,000 Units by mouth 2 (two) times a week. Monday and Thursday   Yes Historical Provider, MD  vitamin E 400 UNIT capsule Take 400 Units by mouth  at bedtime.    Yes Historical Provider, MD  Vortioxetine HBr (TRINTELLIX) 5 MG TABS Take 5 mg by mouth daily.   Yes Historical Provider, MD   BP 138/92 mmHg  Pulse 52  Temp(Src) 98.4 F (36.9 C) (Oral)  Resp 16  Ht 5\' 8"  (1.727 m)  Wt 220 lb (99.791 kg)  BMI 33.46 kg/m2  SpO2  96% Physical Exam  Constitutional: She is oriented to person, place, and time. She appears well-developed and well-nourished.  HENT:  Head: Normocephalic and atraumatic.  Eyes: Conjunctivae and EOM are normal. Pupils are equal, round, and reactive to light.  Neck: Normal range of motion. Neck supple.  Cardiovascular: Normal rate and regular rhythm.   Pulmonary/Chest: Effort normal and breath sounds normal.  Abdominal: Soft. Bowel sounds are normal.  Musculoskeletal: Normal range of motion.  Neurological: She is alert and oriented to person, place, and time.  Skin: Skin is warm and dry.  Psychiatric: She has a normal mood and affect. Her behavior is normal.  Nursing note and vitals reviewed.   ED Course  Procedures (including critical care time) DIAGNOSTIC STUDIES: Oxygen Saturation is 99% on RA, normal by my interpretation.    COORDINATION OF CARE: 9:06 PM Discussed treatment plan with pt at bedside which includes IV fluids, pain management, Clonidine and pt agreed to plan.   Labs Review Labs Reviewed - No data to display  I have personally reviewed and evaluated these lab results as part of my medical decision-making.   MDM   Final diagnoses:  Intractable headache, unspecified chronicity pattern, unspecified headache type    Patient is neurologically intact. She feels better after IV fluids and pain medicine. Her blood pressure has normalized. She has primary care follow-up. I recommended that she adjust her blood pressure medications via her cardiologist.  I personally performed the services described in this documentation, which was scribed in my presence. The recorded information has been  reviewed and is accurate.    Nat Christen, MD 12/11/15 [phone]

## 2015-12-10 NOTE — ED Notes (Addendum)
Pt c/o headache with some vision changes since earlier today. Pt c/o elevated blood pressure x 1 month. Pt has had a change in her BP meds with no relief. Pt reports she does have a small aneurysm. Pt is receiving radiation for breast cancer.

## 2015-12-13 ENCOUNTER — Telehealth (HOSPITAL_COMMUNITY): Payer: Self-pay | Admitting: *Deleted

## 2015-12-13 NOTE — Telephone Encounter (Signed)
Pt advised as per Dr. Whitney Muse of normal-appearing lymph nodes on [place].  Pt has appt on 12/29/15 for office visit with Dr. Whitney Muse

## 2015-12-29 ENCOUNTER — Encounter (HOSPITAL_COMMUNITY): Payer: Self-pay | Admitting: Hematology & Oncology

## 2015-12-29 ENCOUNTER — Encounter (HOSPITAL_COMMUNITY): Payer: BC Managed Care – PPO | Attending: Hematology & Oncology | Admitting: Hematology & Oncology

## 2015-12-29 VITALS — BP 123/80 | HR 68 | Temp 98.3°F | Resp 18 | Wt 218.0 lb

## 2015-12-29 DIAGNOSIS — C50311 Malignant neoplasm of lower-inner quadrant of right female breast: Secondary | ICD-10-CM | POA: Insufficient documentation

## 2015-12-29 DIAGNOSIS — D0511 Intraductal carcinoma in situ of right breast: Secondary | ICD-10-CM | POA: Diagnosis not present

## 2015-12-29 DIAGNOSIS — F418 Other specified anxiety disorders: Secondary | ICD-10-CM | POA: Diagnosis not present

## 2015-12-29 DIAGNOSIS — R5383 Other fatigue: Secondary | ICD-10-CM

## 2015-12-29 DIAGNOSIS — R5382 Chronic fatigue, unspecified: Secondary | ICD-10-CM | POA: Insufficient documentation

## 2015-12-29 MED ORDER — SILVER SULFADIAZINE 1 % EX CREA: 1.0000 "application " | TOPICAL_CREAM | Freq: Two times a day (BID) | CUTANEOUS | Status: DC

## 2015-12-29 NOTE — Progress Notes (Signed)
Sarah Chimes, NP [address] / [place] [address]  DIAGNOSIS: Breast cancer of lower-inner quadrant of right female breast [facility])   Staging form: Breast, AJCC 7th Edition     Clinical stage from 08/31/2015: Stage 0 (Tis (DCIS), N0, M0) - Unsigned       Staging comments: Staged at breast conference on 11.9.16    SUMMARY OF ONCOLOGIC HISTORY:   Breast cancer of lower-inner quadrant of right female breast ([name])   08/19/2015 Breast US Masslike asymmetry within the lower inner quadrant of the right breast, at posterior depth, with associated microcalcifications which are new.    08/23/2015 Initial Biopsy AT LEAST HIGH DUCTAL CARCINOMA IN SITU   08/23/2015 Receptors her2 Estrogen Receptor: 0%, NEGATIVE Progesterone Receptor: 0%, NEGATIVE   08/26/2015 Initial Diagnosis Breast cancer of lower-inner quadrant of right female breast ([place])    CURRENT THERAPY:  INTERVAL HISTORY: Sarah Cooley 50 y.o. female returns for follow-up of R breast cancer.  History is as follows:  Sarah Cooley had bilateral screening mammography 11/02/2014 at the breast Center showing a possible area of asymmetry in the right breast. Right diagnostic mammography and ultrasonography genera 20 03/11/2015 found the breast density to be had a gray B. The area in the superior right breast noticed on screening mammography did not persist but there was another asymmetry in the medial right breast posteriorly which appeared more significant. This was not palpable. Ultrasound of this area was negative.  Stereotactic biopsy of the right breast area in question was attempted 11/25/2014 but with age in no definitive sonographic abnormality noted, and the area of asymmetry appearing less conspicuous, 6 month follow-up was suggested. This was performed 08/19/2015. Diagnostic right mammography with tomosynthesis and right breast ultrasonography on that date again found the area of asymmetry in the lower inner quadrant of the  right breast, now more masslike in appearance (until tomosynthesis. Ultrasound again was unhelpful.  Stereotactic biopsy of the right breast mass in question was performed 08/23/2015 and showed ([id number]) ductal carcinoma in situ, high-grade, possibly involving a papilloma, with areas suspicious for microinvasion. The cells were estrogen and progesterone receptor negative.  Sarah Cooley is accompanied by her husband. She has previously met with Dr. Jana Cooley and is not on any therapy. They spoke at length about her diagnosis. She has also met with Dr. Isidore Cooley of radiation oncology.  Sarah Cooley was here with her husband today.  She says that since her diagnosis of breast cancer everything has been continuing to get worse.   She has not gone back to work. She has depression and anxiety issues because she is not working.  She has been having fatigue, coordination and concentration issues. She fell asleep in her chair waiting to be seen today. She also said that it took her 3 hours to balance her checkbook the other day.  Her mother was recently diagnosed with Parkinson's.  She said she has had a small tremor since her late 20's but in the past 13 months it has gotten worse and has spread into her legs and head.  She recently has seen 2 different neurologists. 1 was in January at [place].   She was in the hospital last September for acute renal failure.  She sees her therapist once a week or once every other week. Her therapist told her to keep working on her depression. She states that her depression has gotten worse because she is not working. She says that she cries frequently.  She says that her mouth and lips are constantly dry with an awful taste. She says that she has been drinking a lot of water.  She says that she has been eating. She said that yesterday her bowel medicine changed and she took a mild laxative.  MEDICAL HISTORY: Past Medical History  Diagnosis Date  .  Fibromyalgia   . Depression   . Hypertension   . Hyperlipidemia   . Breast cancer ([place])   . Fibromyalgia   . Arthritis   . Hyperlipemia   . Family history of breast cancer   . Family history of colon cancer   . Diabetes mellitus without complication ([place])     not on medication at this time  . Occasional tremors   . Balance problem   . OSA (obstructive sleep apnea)     uses cpap with humidification  . Anxiety   . H/O acute renal failure 09/22/15    admitted to [facility]  . History of hiatal hernia   . GERD (gastroesophageal reflux disease)     none recent  . Headache     occipital and temporal  . Elevated liver function tests   . Irritable bowel syndrome (IBS)   . Constipation   . Complication of anesthesia     heart rate drops and O2 sats drop  . PONV (postoperative nausea and vomiting)   . Family history of adverse reaction to anesthesia     mom has n/v  . Brain aneurysm     2 mm ACA region aneurysm by 09/21/15 MRA    has Headache; Elevated troponin; Hypertension; Obesity; Pain in the chest; Cephalalgia; Breast cancer of lower-inner quadrant of right female breast ([place]); Family history of breast cancer; Family history of colon cancer; and Genetic testing on her problem list.     is allergic to shrimp; bactrim; erythromycin; minocycline hcl; ramipril; adhesive; drixoral; and sinutab.  SURGICAL HISTORY: Past Surgical History  Procedure Laterality Date  . Bone graft hip iliac crest    . Abdominal hysterectomy    . Bladder surgery    . Knee arthroscopy w/ acl reconstruction      right  . Cholecystectomy    . Fracture surgery Right     wrist  . Radioactive seed guided mastectomy with axillary sentinel lymph node biopsy Right 10/13/2015    Procedure: RADIOACTIVE SEED GUIDED PARTIAL MASTECTOMY WITH AXILLARY SENTINEL LYMPH NODE BIOPSY;  Surgeon: Erroll Luna, MD;  Location: [place] OR;  Service: General;  Laterality: Right;    SOCIAL HISTORY: Social History    Social History  . Marital Status: Married    Spouse Name: Psychologist, clinical  . Number of Children: 2  . Years of Education: N/A   Occupational History  . physician office    Social History Main Topics  . Smoking status: Never Smoker   . Smokeless tobacco: Never Used  . Alcohol Use: 0.0 oz/week    0 Standard drinks or equivalent per week     Comment: 2-3 times a year  . Drug Use: No  . Sexual Activity: Yes   Other Topics Concern  . 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 The patient's husband, [name], is a Curator. The patient has 2 children from a prior marriage, Duard Larsen who lives in [place] and is disabled secondary to bipolar disorder; and Melvenia Needles, who lives in [place] and works as a Administrator, sports  assistant and equal treat him. The patient has 2 grandchildren and one on the way. [name] has a daughter from an earlier marriage Cyril Mourning who works as an Therapist, sports in [place]. Fey attends a [employer].  GYNECOLOGIC HISTORY:  No LMP recorded. Patient has had a hysterectomy. Menarche age 84, first live birth at 25. The patient is GX P2. She underwent partial hysterectomy, without salpingo-oophorectomy, remotely. She did not take hormone replacement. She did use oral contraceptives for approximately 3 years in her 63O, with no complications    FAMILY HISTORY: Family History  Problem Relation Age of Onset  . Hypertension Mother   . Autoimmune disease Mother   . Eczema Mother   . Heart disease Maternal Aunt   . Lung cancer Paternal Aunt   . Breast cancer Paternal Aunt     dx in her 79s  . Lung cancer Paternal Grandfather   . Colon cancer Paternal Uncle     dx in her 44s  . Colon cancer Paternal Uncle     dx in his 45s  . Melanoma Father 97  . Psoriasis Father   . Hypertension Father   . Heart disease Father   . Breast cancer Sister     dx in her 84s  .  Lupus Sister   . COPD Maternal Uncle   . Colon cancer Paternal Uncle     dx in his 4s  The patient's parents are living, in their mid 74s. The patient has one brother, and one sister. On the paternal side, the patient's father had melanoma diagnosed in his 70s. The paternal grandfather, who died at the age of 21, had a history of lung and colon cancer. There are 2 uncles with colon cancer and one with lung cancer as well as one aunt on the paternal side with breast cancer diagnosed in her late 11s. The patient's only sister underwent lumpectomy at the age of 39, but the patient does not know the exact results.  Review of Systems  Constitutional: Positive for malaise/fatigue.       Wears glasses.  Chronic fatigue Mouth/Lips constantly dry with an awful taste.  HENT: Negative.   Eyes: Negative.   Respiratory: Negative.   Cardiovascular: Negative.   Gastrointestinal: Negative.   Genitourinary: Negative.   Musculoskeletal: Positive for falls.       Falls from balance issues.  Skin: Negative.        Enlarged lymph node in the neck. Palpable abnormality in the right breast.  Neurological: Positive for tremors.       Hand tremor. Balance issues. Issues focusing (loses train of thought frequently)  Endo/Heme/Allergies: Negative.   Psychiatric/Behavioral: Positive for depression. The patient is nervous/anxious.        Has been having coordination and concentration issues.  All other systems reviewed and are negative. 14 point review of systems was performed and is negative except as detailed under history of present illness and above   PHYSICAL EXAMINATION  ECOG PERFORMANCE STATUS: 1 - Symptomatic but completely ambulatory  Filed Vitals:   12/29/15 1356  BP: 123/80  Pulse: 68  Temp: 98.3 F (36.8 C)  Resp: 18    Physical Exam  Constitutional: She is oriented to person, place, and time and well-developed, well-nourished, and in no distress.  HENT:  Head: Normocephalic and  atraumatic.  Nose: Nose normal.  Mouth/Throat: Oropharynx is clear and moist. No oropharyngeal exudate.  Eyes: Conjunctivae and EOM are normal. Pupils are equal, round, and reactive to  light. Right eye exhibits no discharge. Left eye exhibits no discharge. No scleral icterus.  Neck: Normal range of motion. Neck supple. No tracheal deviation present. No thyromegaly present.  Cardiovascular: Normal rate, regular rhythm and normal heart sounds.  Exam reveals no gallop and no friction rub.   No murmur heard. Pulmonary/Chest: Effort normal and breath sounds normal. She has no wheezes. She has no rales.  Abdominal: Soft. Bowel sounds are normal. She exhibits no distension and no mass. There is no tenderness. There is no rebound and no guarding.  Musculoskeletal: Normal range of motion. She exhibits no edema.  Lymphadenopathy:    She has no cervical adenopathy.  Neurological: She is alert and oriented to person, place, and time. She has normal reflexes. No cranial nerve deficit. Gait normal. Coordination normal.  Skin: Skin is warm and dry. No rash noted.  Psychiatric:  Anxiety, depression  Nursing note and vitals reviewed. R breast with XRT changes, mild desquamation   LABORATORY DATA: I have reviewed the data as listed. CBC    Component Value Date/Time   WBC 6.6 10/12/2015 1122   WBC 8.1 08/31/2015 1217   RBC 4.17 10/12/2015 1122   RBC 4.78 08/31/2015 1217   HGB 13.4 10/12/2015 1122   HGB 15.5 08/31/2015 1217   HCT 39.0 10/12/2015 1122   HCT 45.4 08/31/2015 1217   PLT 213 10/12/2015 1122   PLT 253 08/31/2015 1217   MCV 93.5 10/12/2015 1122   MCV 95.0 08/31/2015 1217   MCH 32.1 10/12/2015 1122   MCH 32.5 08/31/2015 1217   MCHC 34.4 10/12/2015 1122   MCHC 34.2 08/31/2015 1217   RDW 12.6 10/12/2015 1122   RDW 13.5 08/31/2015 1217   LYMPHSABS 1.9 10/12/2015 1122   LYMPHSABS 2.3 08/31/2015 1217   MONOABS 0.6 10/12/2015 1122   MONOABS 0.5 08/31/2015 1217   EOSABS 0.1 10/12/2015  1122   EOSABS 0.1 08/31/2015 1217   BASOSABS 0.0 10/12/2015 1122   BASOSABS 0.0 08/31/2015 1217   CMP     Component Value Date/Time   NA 143 10/12/2015 1122   NA 138 08/31/2015 1217   K 3.6 10/12/2015 1122   K 2.6* 08/31/2015 1217   CL 107 10/12/2015 1122   CO2 28 10/12/2015 1122   CO2 27 08/31/2015 1217   GLUCOSE 105* 10/12/2015 1122   GLUCOSE 114 08/31/2015 1217   BUN 11 10/12/2015 1122   BUN 10.3 08/31/2015 1217   CREATININE 1.07* 10/12/2015 1122   CREATININE 0.8 08/31/2015 1217   CALCIUM 9.5 10/12/2015 1122   CALCIUM 9.7 08/31/2015 1217   PROT 7.0 10/12/2015 1122   PROT 7.8 08/31/2015 1217   ALBUMIN 3.7 10/12/2015 1122   ALBUMIN 3.9 08/31/2015 1217   AST 20 10/12/2015 1122   AST 24 08/31/2015 1217   ALT 22 10/12/2015 1122   ALT 37 08/31/2015 1217   ALKPHOS 59 10/12/2015 1122   ALKPHOS 70 08/31/2015 1217   BILITOT 0.7 10/12/2015 1122   BILITOT 0.64 08/31/2015 1217   GFRNONAA 60* 10/12/2015 1122   GFRAA >60 10/12/2015 1122     RADIOGRAPHIC STUDIES: I have personally reviewed the radiological images as listed and agreed with the findings in the report. Study Result     CLINICAL DATA: History of breast cancer now with palpable lump involving the right lateral aspect of the neck.  EXAM: ULTRASOUND OF HEAD/NECK SOFT TISSUES  TECHNIQUE: Ultrasound examination of the head and neck soft tissues was performed in the area of clinical concern.  COMPARISON: None.  FINDINGS: The patient's palpable area of concern correlates with a nonenlarged and benign-appearing cervical lymph node which measures approximately 0.8 cm in greatest short axis diameter and maintains a benign fatty hilum (image 7).  Note is also made of an adjacent benign-appearing cervical lymph node which too is not enlarged by size criteria measuring 0.3 cm in diameter (image 14).  No additional discrete solid or cystic masses correlate with the patient's palpable area of  concern.  IMPRESSION: Two benign-appearing and non pathologically enlarged cervical lymph nodes correlate with the patient's palpable area of concern within the right lateral neck, nonspecific though favored to be reactive in etiology. Clinical correlation to ensure resolution is recommended.   Electronically Signed  By: Sandi Mariscal M.D.  On: 12/03/2015 06:38    Study Result     CLINICAL DATA: 50 year old female with new diagnosis of breast cancer. Syncope in weakness. Subsequent encounter. Small anterior communicating artery aneurysm on November MRA.  EXAM: MRI HEAD WITHOUT AND WITH CONTRAST  TECHNIQUE: Multiplanar, multiecho pulse sequences of the brain and surrounding structures were obtained without and with intravenous contrast.  CONTRAST: 5m MULTIHANCE GADOBENATE DIMEGLUMINE 529 MG/ML IV SOLN  COMPARISON: Intracranial MRA 09/21/2015. Head CT without contrast 07/17/2015  FINDINGS: No midline shift, mass effect, or evidence of intracranial mass lesion. No abnormal enhancement identified. Small anterior right frontal lobe developmental venous anomaly (normal anatomic variant series 11, image 70). No dural thickening.  Cerebral volume is within normal limits. No restricted diffusion to suggest acute infarction. No ventriculomegaly, extra-axial collection or acute intracranial hemorrhage. Cervicomedullary junction and pituitary are within normal limits. Negative visualized cervical spine. GPearline Cablesand white matter signal is within normal limits for age throughout the brain. Major intracranial vascular flow voids are preserved.  Visible internal auditory structures appear normal. Mastoids are clear. Mild mostly left maxillary sinus paranasal sinus mucosal thickening. Negative orbit and scalp soft tissues. Visualized bone marrow signal is within normal limits.  IMPRESSION: 1. No acute or metastatic intracranial abnormality. 2. Tiny anterior  communicating artery aneurysm described in November is only evident by MRA.   Electronically Signed  By: HGenevie AnnM.D.  On: 11/16/2015 08:50    PATHOLOGY:     ASSESSMENT and THERAPY PLAN:  High Grade DCIS R breast ER- PR- Negative Sentinel LN Right breast seed localized partial mastectomy with right axillary sentinel lymph node mapping Dr. CBrantley Stage12/22/2016 Adjuvant XRT Anxiety about health  [facility] [place] [facility] [place] [facility] As far as medical oncology follow-up she understands she is not a candidate for anti-estrogens and we do not do chemotherapy for ductal carcinoma in situ tumors. She does warrant follow-up and we generally see patients with ductal carcinoma in situ once a year for 5 years. She would [place] we will refer her to our partner Dr. PWhitney Musethere, probably to see the patient February or so by which time all her local treatment should be completed.  We discussed some of her skin issues from XRT and I have encouraged her that these are not uncommon and she is doing well. She completed XRT on 3/1.  I have written her a prescription for silvadene.    [facility] She did meet [place].  She will return in a month for a follow up.   All questions were answered. The patient knows to call the clinic with any problems, questions or concerns.  We can certainly see the patient much sooner if necessary.  This document serves as a record of services personally performed by Ancil Linsey, MD. It was created on her behalf by Kandace Blitz, a trained medical scribe. The creation of this record is based on the scribe's personal observations and the provider's statements to them. This document has been checked and approved by the attending provider.  I have reviewed the above documentation for accuracy and completeness, and I agree with the  above.  This note was electronically signed. Molli Hazard, MD  12/29/2015

## 2015-12-29 NOTE — Patient Instructions (Signed)
[place] at [facility] Discharge Instructions  RECOMMENDATIONS MADE BY THE CONSULTANT AND ANY TEST RESULTS WILL BE SENT TO YOUR REFERRING PHYSICIAN.   Exam and discussion by Dr Whitney Muse today Silvadene cream sent to [place]. Hopefully this will help with your breast  Return to see the doctor in 1 month with labs  Please call the clinic if you have any questions or concerns    Thank you for choosing [place] at [facility] to provide your oncology and hematology care.  To afford each patient quality time with our provider, please arrive at least 15 minutes before your scheduled appointment time.   Beginning January 23rd 2017 lab work for the [facility] will be done in the  Main lab at [facility] on 1st floor. If you have a lab appointment with the [facility] please come in thru the  Main Entrance and check in at the main information desk  You need to re-schedule your appointment should you arrive 10 or more minutes late.  We strive to give you quality time with our providers, and arriving late affects you and other patients whose appointments are after yours.  Also, if you no show three or more times for appointments you may be dismissed from the clinic at the providers discretion.     Again, thank you for choosing [facility].  Our hope is that these requests will decrease the amount of time that you wait before being seen by our physicians.       _____________________________________________________________  Should you have questions after your visit to [facility], please contact our office at [phone] between the hours of 8:30 a.m. and 4:30 p.m.  Voicemails left after 4:30 p.m. will not be returned until the following business day.  For prescription refill requests, have your pharmacy contact our office.         Resources For Cancer Patients and their Caregivers ? American Cancer  Society: Can assist with transportation, wigs, general needs, runs Look Good Feel Better.        734-425-2088 ? Cancer Care: Provides financial assistance, online support groups, medication/co-pay assistance.  1-800-813-HOPE 910-620-8388) ? [facility] Assists [place] cancer patients and their families through emotional , educational and financial support.  510-695-6829 ? [place] DSS Where to apply for food stamps, Medicaid and utility assistance. 309-525-1360 ? [facility]: Transportation to medical appointments. 204-075-7112 ? Social Security Administration: May apply for disability if have a Stage IV cancer. 775-774-0900 (708)488-2153 ? [employer], Disability and Transit Services: Assists with nutrition, care and transit needs. (703) [phone]

## 2016-01-02 ENCOUNTER — Telehealth: Payer: Self-pay | Admitting: *Deleted

## 2016-01-02 NOTE — Telephone Encounter (Signed)
Attempted to leave message for a return phone call to follow up post XRT. Voicemail was full.

## 2016-01-12 ENCOUNTER — Other Ambulatory Visit (HOSPITAL_COMMUNITY): Payer: Self-pay | Admitting: Nurse Practitioner

## 2016-01-12 ENCOUNTER — Ambulatory Visit (HOSPITAL_COMMUNITY)
Admission: RE | Admit: 2016-01-12 | Discharge: 2016-01-12 | Disposition: A | Discharge status: Home / Self Care | Payer: BC Managed Care – PPO | Source: Ambulatory Visit | Attending: Nurse Practitioner | Admitting: Nurse Practitioner

## 2016-01-12 DIAGNOSIS — M545 Low back pain: Secondary | ICD-10-CM | POA: Diagnosis not present

## 2016-01-12 DIAGNOSIS — M546 Pain in thoracic spine: Secondary | ICD-10-CM

## 2016-01-12 DIAGNOSIS — M25562 Pain in left knee: Secondary | ICD-10-CM

## 2016-01-12 DIAGNOSIS — M25561 Pain in right knee: Secondary | ICD-10-CM | POA: Diagnosis not present

## 2016-01-26 ENCOUNTER — Encounter (HOSPITAL_COMMUNITY): Payer: BC Managed Care – PPO | Attending: Hematology & Oncology | Admitting: Hematology & Oncology

## 2016-01-26 ENCOUNTER — Encounter (HOSPITAL_COMMUNITY): Payer: BC Managed Care – PPO

## 2016-01-26 ENCOUNTER — Encounter (HOSPITAL_COMMUNITY): Payer: Self-pay | Admitting: Hematology & Oncology

## 2016-01-26 VITALS — BP 111/79 | HR 73 | Temp 98.1°F | Resp 18 | Wt 220.0 lb

## 2016-01-26 DIAGNOSIS — D0511 Intraductal carcinoma in situ of right breast: Secondary | ICD-10-CM | POA: Diagnosis not present

## 2016-01-26 DIAGNOSIS — C50311 Malignant neoplasm of lower-inner quadrant of right female breast: Secondary | ICD-10-CM | POA: Insufficient documentation

## 2016-01-26 DIAGNOSIS — F418 Other specified anxiety disorders: Secondary | ICD-10-CM

## 2016-01-26 DIAGNOSIS — Z171 Estrogen receptor negative status [ER-]: Secondary | ICD-10-CM | POA: Diagnosis not present

## 2016-01-26 DIAGNOSIS — R5382 Chronic fatigue, unspecified: Secondary | ICD-10-CM

## 2016-01-26 DIAGNOSIS — R21 Rash and other nonspecific skin eruption: Secondary | ICD-10-CM

## 2016-01-26 LAB — COMPREHENSIVE METABOLIC PANEL
ALT: 21 U/L (ref 14–54)
AST: 21 U/L (ref 15–41)
Albumin: 3.9 g/dL (ref 3.5–5.0)
Alkaline Phosphatase: 55 U/L (ref 38–126)
Anion gap: 9 (ref 5–15)
BILIRUBIN TOTAL: 0.4 mg/dL (ref 0.3–1.2)
BUN: 20 mg/dL (ref 6–20)
CALCIUM: 8.8 mg/dL — AB (ref 8.9–10.3)
CHLORIDE: 107 mmol/L (ref 101–111)
CO2: 23 mmol/L (ref 22–32)
Creatinine, Ser: 0.83 mg/dL (ref 0.44–1.00)
GFR calc Af Amer: >60 mL/min (ref >60)
Glucose, Bld: 94 mg/dL (ref 65–99)
Potassium: 3.6 mmol/L (ref 3.5–5.1)
Sodium: 139 mmol/L (ref 135–145)
TOTAL PROTEIN: 7.3 g/dL (ref 6.5–8.1)

## 2016-01-26 LAB — CBC WITH DIFFERENTIAL/PLATELET
BASOS ABS: 0 10*3/uL (ref 0.0–0.1)
Basophils Relative: 0 %
Eosinophils Absolute: 0.2 10*3/uL (ref 0.0–0.7)
Eosinophils Relative: 4 %
HEMATOCRIT: 38.2 % (ref 36.0–46.0)
HEMOGLOBIN: 13.2 g/dL (ref 12.0–15.0)
LYMPHS PCT: 26 %
Lymphs Abs: 1.4 10*3/uL (ref 0.7–4.0)
MCH: 31.3 pg (ref 26.0–34.0)
MCHC: 34.6 g/dL (ref 30.0–36.0)
MCV: 90.5 fL (ref 78.0–100.0)
Monocytes Absolute: 0.6 10*3/uL (ref 0.1–1.0)
Monocytes Relative: 12 %
NEUTROS ABS: 3.1 10*3/uL (ref 1.7–7.7)
Neutrophils Relative %: 58 %
Platelets: 175 10*3/uL (ref 150–400)
RBC: 4.22 MIL/uL (ref 3.87–5.11)
RDW: 12.4 % (ref 11.5–15.5)
WBC: 5.4 10*3/uL (ref 4.0–10.5)

## 2016-01-26 LAB — SEDIMENTATION RATE: SED RATE: 26 mm/h — AB (ref 0–22)

## 2016-01-26 LAB — C-REACTIVE PROTEIN: CRP: 0.7 mg/dL (ref <1.0)

## 2016-01-26 NOTE — Progress Notes (Signed)
Sarah Chimes, NP [address] / [place] [address]  DIAGNOSIS: Breast cancer of lower-inner quadrant of right female breast [facility])   Staging form: Breast, AJCC 7th Edition     Clinical stage from 08/31/2015: Stage 0 (Tis (DCIS), N0, M0) - Unsigned       Staging comments: Staged at breast conference on 11.9.16    SUMMARY OF ONCOLOGIC HISTORY:   Breast cancer of lower-inner quadrant of right female breast ([place])   08/19/2015 Breast US Masslike asymmetry within the lower inner quadrant of the right breast, at posterior depth, with associated microcalcifications which are new.    08/23/2015 Initial Biopsy AT LEAST HIGH DUCTAL CARCINOMA IN SITU   08/23/2015 Receptors her2 Estrogen Receptor: 0%, NEGATIVE Progesterone Receptor: 0%, NEGATIVE   08/26/2015 Initial Diagnosis Breast cancer of lower-inner quadrant of right female breast ([place])   11/17/2015 - 12/21/2015 Radiation Therapy 50 Gy in 25 fractions to right breast by Dr. Isidore Moos.    CURRENT THERAPY:  INTERVAL HISTORY: Sarah Cooley 50 y.o. female returns for follow-up of R breast cancer.  History is as follows:  [place] had bilateral screening mammography 11/02/2014 at the breast Center showing a possible area of asymmetry in the right breast. Right diagnostic mammography and ultrasonography genera 20 03/11/2015 found the breast density to be had a gray B. The area in the superior right breast noticed on screening mammography did not persist but there was another asymmetry in the medial right breast posteriorly which appeared more significant. This was not palpable. Ultrasound of this area was negative.  Stereotactic biopsy of the right breast area in question was attempted 11/25/2014 but with age in no definitive sonographic abnormality noted, and the area of asymmetry appearing less conspicuous, 6 month follow-up was suggested. This was performed 08/19/2015. Diagnostic right mammography with tomosynthesis and right breast  ultrasonography on that date again found the area of asymmetry in the lower inner quadrant of the right breast, now more masslike in appearance (until tomosynthesis. Ultrasound again was unhelpful.  Stereotactic biopsy of the right breast mass in question was performed 08/23/2015 and showed ([id number]) ductal carcinoma in situ, high-grade, possibly involving a papilloma, with areas suspicious for microinvasion. The cells were estrogen and progesterone receptor negative.   Sarah Cooley is accompanied by her husband today.  She has been breaking out on her knees and face. They get red but is not itchy. In March she said that she woke up one night at 4:30 because her leg was hurting and noticed that she was broken out. She says that she can feel this come on, but it comes and goes. She has had allergy testing but it was years ago and took some shots for this but it didn't help. She showed me pictures of this rash today on her phone. Her back and ankles still hurt her. She also said that her chest pain lasts longer when her breaking out episodes come on.  She fell again recently. She lost her balance and fell into a cart. She denies any "trauma" from her fall.   She had X-Rays done recently and they showed signs of osteoarthritis.   She said that the Silvadene cream prescribed during her last visit "did the trick" and she notes that her breast is completely healed from XRT.    She is going to meet with Elzie Rings with the survivorship clinic as soon as possible.   MEDICAL HISTORY: Past Medical History  Diagnosis Date  . Fibromyalgia   .  Depression   . Hypertension   . Hyperlipidemia   . Breast cancer ([place])   . Fibromyalgia   . Arthritis   . Hyperlipemia   . Family history of breast cancer   . Family history of colon cancer   . Diabetes mellitus without complication ([place])     not on medication at this time  . Occasional tremors   . Balance problem   . OSA (obstructive sleep apnea)      uses cpap with humidification  . Anxiety   . H/O acute renal failure 09/22/15    admitted to [facility]  . History of hiatal hernia   . GERD (gastroesophageal reflux disease)     none recent  . Headache     occipital and temporal  . Elevated liver function tests   . Irritable bowel syndrome (IBS)   . Constipation   . Complication of anesthesia     heart rate drops and O2 sats drop  . PONV (postoperative nausea and vomiting)   . Family history of adverse reaction to anesthesia     mom has n/v  . Brain aneurysm     2 mm ACA region aneurysm by 09/21/15 MRA    has Headache; Elevated troponin; Hypertension; Obesity; Pain in the chest; Cephalalgia; Breast cancer of lower-inner quadrant of right female breast ([place]); Family history of breast cancer; Family history of colon cancer; and Genetic testing on her problem list.     is allergic to shrimp; bactrim; erythromycin; minocycline hcl; ramipril; adhesive; drixoral; and sinutab.  SURGICAL HISTORY: Past Surgical History  Procedure Laterality Date  . Bone graft hip iliac crest    . Abdominal hysterectomy    . Bladder surgery    . Knee arthroscopy w/ acl reconstruction      right  . Cholecystectomy    . Fracture surgery Right     wrist  . Radioactive seed guided mastectomy with axillary sentinel lymph node biopsy Right 10/13/2015    Procedure: RADIOACTIVE SEED GUIDED PARTIAL MASTECTOMY WITH AXILLARY SENTINEL LYMPH NODE BIOPSY;  Surgeon: Erroll Luna, MD;  Location: [facility];  Service: General;  Laterality: Right;    SOCIAL HISTORY: Social History   Social History  . Marital Status: Married    Spouse Name: Psychologist, clinical  . Number of Children: 2  . Years of Education: N/A   Occupational History  . physician office    Social History Main Topics  . Smoking status: Never Smoker   . Smokeless tobacco: Never Used  . Alcohol Use: 0.0 oz/week    0 Standard drinks or equivalent per week     Comment: 2-3 times a year  . Drug Use: No    . Sexual Activity: Yes   Other Topics Concern  . 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 The patient's husband, [name], is a Curator. The patient has 2 children from a prior marriage, Duard Larsen who lives in [place] and is disabled secondary to bipolar disorder; and Melvenia Needles, who lives in [place] and works as a Curator and equal treat him. The patient has 2 grandchildren and one on the way. [name] has a daughter from an earlier marriage Cyril Mourning who works as an Therapist, sports in [place]. Loany attends a [employer].  GYNECOLOGIC HISTORY:  No LMP recorded. Patient has had a hysterectomy. Menarche age 24, first live birth at 57. The patient is  [place] P2. She underwent partial hysterectomy, without salpingo-oophorectomy, remotely. She did not take hormone replacement. She did use oral contraceptives for approximately 3 years in her 19X, with no complications    FAMILY HISTORY: Family History  Problem Relation Age of Onset  . Hypertension Mother   . Autoimmune disease Mother   . Eczema Mother   . Heart disease Maternal Aunt   . Lung cancer Paternal Aunt   . Breast cancer Paternal Aunt     dx in her 23s  . Lung cancer Paternal Grandfather   . Colon cancer Paternal Uncle     dx in her 17s  . Colon cancer Paternal Uncle     dx in his 73s  . Melanoma Father 41  . Psoriasis Father   . Hypertension Father   . Heart disease Father   . Breast cancer Sister     dx in her 91s  . Lupus Sister   . COPD Maternal Uncle   . Colon cancer Paternal Uncle     dx in his 42s  The patient's parents are living, in their mid 68s. The patient has one brother, and one sister. On the paternal side, the patient's father had melanoma diagnosed in his 79s. The paternal grandfather, who died at the age of 53, had a history of lung and colon cancer. There are 2 uncles with colon  cancer and one with lung cancer as well as one aunt on the paternal side with breast cancer diagnosed in her late 82s. The patient's only sister underwent lumpectomy at the age of 3, but the patient does not know the exact results.  Review of Systems  Constitutional: Positive for malaise/fatigue.       Wears glasses.  Chronic fatigue   HENT: Negative.   Eyes: Negative.   Respiratory: Negative.   Cardiovascular: Positive for chest pain.       Is worse when she has a breaking out episode.   Gastrointestinal: Negative.   Genitourinary: Negative.   Musculoskeletal: Positive for back pain and falls.       Back and ankle pain. [place] recently. Falls from balance issues.  Skin: Positive for rash. Negative for itching.       Enlarged lymph node in the neck. Palpable abnormality in the right breast. Breaking out on knees and face. Face gets red but is not itchy.  Neurological: Positive for tremors.       Hand tremor. Balance issues. Issues focusing (loses train of thought frequently)  Endo/Heme/Allergies: Negative.   Psychiatric/Behavioral: Positive for depression. The patient is nervous/anxious.        Has been having coordination and concentration issues.  All other systems reviewed and are negative. 14 point review of systems was performed and is negative except as detailed under history of present illness and above   PHYSICAL EXAMINATION  ECOG PERFORMANCE STATUS: 1 - Symptomatic but completely ambulatory  Filed Vitals:   01/26/16 1100  BP: 111/79  Pulse: 73  Temp: 98.1 F (36.7 C)  Resp: 18    Physical Exam  Constitutional: She is oriented to person, place, and time and well-developed, well-nourished, and in no distress.  HENT:  Head: Normocephalic and atraumatic.  Nose: Nose normal.  Mouth/Throat: Oropharynx is clear and moist. No oropharyngeal exudate.  Eyes: Conjunctivae and EOM are normal. Pupils are equal, round, and reactive to light. Right eye exhibits no  discharge. Left eye exhibits no discharge. No scleral icterus.  Neck: Normal range of motion. Neck supple. No  tracheal deviation present. No thyromegaly present.  Cardiovascular: Normal rate, regular rhythm and normal heart sounds.  Exam reveals no gallop and no friction rub.   No murmur heard. Pulmonary/Chest: Effort normal and breath sounds normal. She has no wheezes. She has no rales.  Abdominal: Soft. Bowel sounds are normal. She exhibits no distension and no mass. There is no tenderness. There is no rebound and no guarding.  Musculoskeletal: Normal range of motion. She exhibits no edema.  Lymphadenopathy:    She has no cervical adenopathy.  Neurological: She is alert and oriented to person, place, and time. She has normal reflexes. No cranial nerve deficit. Gait normal. Coordination normal.  Skin: Skin is warm and dry. No rash noted.  Psychiatric:  Anxiety, depression  Nursing note and vitals reviewed.   LABORATORY DATA: I have reviewed the data as listed. CBC    Component Value Date/Time   WBC 5.4 01/26/2016 1021   WBC 8.1 08/31/2015 1217   RBC 4.22 01/26/2016 1021   RBC 4.78 08/31/2015 1217   HGB 13.2 01/26/2016 1021   HGB 15.5 08/31/2015 1217   HCT 38.2 01/26/2016 1021   HCT 45.4 08/31/2015 1217   PLT 175 01/26/2016 1021   PLT 253 08/31/2015 1217   MCV 90.5 01/26/2016 1021   MCV 95.0 08/31/2015 1217   MCH 31.3 01/26/2016 1021   MCH 32.5 08/31/2015 1217   MCHC 34.6 01/26/2016 1021   MCHC 34.2 08/31/2015 1217   RDW 12.4 01/26/2016 1021   RDW 13.5 08/31/2015 1217   LYMPHSABS 1.4 01/26/2016 1021   LYMPHSABS 2.3 08/31/2015 1217   MONOABS 0.6 01/26/2016 1021   MONOABS 0.5 08/31/2015 1217   EOSABS 0.2 01/26/2016 1021   EOSABS 0.1 08/31/2015 1217   BASOSABS 0.0 01/26/2016 1021   BASOSABS 0.0 08/31/2015 1217   CMP     Component Value Date/Time   NA 139 01/26/2016 1021   NA 138 08/31/2015 1217   K 3.6 01/26/2016 1021   K 2.6* 08/31/2015 1217   CL 107 01/26/2016  1021   CO2 23 01/26/2016 1021   CO2 27 08/31/2015 1217   GLUCOSE 94 01/26/2016 1021   GLUCOSE 114 08/31/2015 1217   BUN 20 01/26/2016 1021   BUN 10.3 08/31/2015 1217   CREATININE 0.83 01/26/2016 1021   CREATININE 0.8 08/31/2015 1217   CALCIUM 8.8* 01/26/2016 1021   CALCIUM 9.7 08/31/2015 1217   PROT 7.3 01/26/2016 1021   PROT 7.8 08/31/2015 1217   ALBUMIN 3.9 01/26/2016 1021   ALBUMIN 3.9 08/31/2015 1217   AST 21 01/26/2016 1021   AST 24 08/31/2015 1217   ALT 21 01/26/2016 1021   ALT 37 08/31/2015 1217   ALKPHOS 55 01/26/2016 1021   ALKPHOS 70 08/31/2015 1217   BILITOT 0.4 01/26/2016 1021   BILITOT 0.64 08/31/2015 1217   GFRNONAA >60 01/26/2016 1021   GFRAA >60 01/26/2016 1021   RADIOGRAPHIC STUDIES: I have personally reviewed the radiological images as listed and agreed with the findings in the report. Study Result     CLINICAL DATA: Upper and lower back pain, bilateral knee pain, for 14 months. No known injury.  History of fibromyalgia, breast cancer and right knee surgery.  EXAM: THORACIC SPINE - 3 VIEWS  COMPARISON: None.  FINDINGS: There is a minimal scoliosis of the thoracolumbar spine, possibly positional in nature. Also a mild kyphosis of the thoracic spine. Alignment appears otherwise normal. Overall bone mineralization is normal. No acute or suspicious osseous lesion. No fracture line or displaced  fracture fragment. No compression fracture deformity. No significant degenerative change.  Paravertebral soft tissues are unremarkable. Cholecystectomy clips noted in the right upper quadrant.  IMPRESSION: No acute findings. No significant degenerative change.   Electronically Signed  By: Franki Cabot M.D.  On: 01/12/2016 15:35   Study Result     CLINICAL DATA: Low back pain, upper back pain, bilateral knee pain for about 14 months, no known injury  EXAM: LUMBAR SPINE - COMPLETE 4+ VIEW  COMPARISON: CT scan  09/27/2015  FINDINGS: Five views of lumbar spine submitted. No acute fracture or subluxation. Minimal disc space flattening with mild anterior spurring at L1-L2 level. Mild disc space flattening with mild anterior spurring at L4-L5 level. Mild disc space flattening at L5-S1 level. Mild facet degenerative changes at L5 level. Alignment and vertebral body heights are preserved.  IMPRESSION: No acute fracture or subluxation. Mild degenerative changes as described above.   Electronically Signed  By: Lahoma Crocker M.D.  On: 01/12/2016 15:41   Study Result     CLINICAL DATA: Bilateral knee pain for several months, no known injury, initial encounter  EXAM: LEFT KNEE - COMPLETE 4+ VIEW  COMPARISON: None.  FINDINGS: There is no evidence of fracture, dislocation, or joint effusion. There is no evidence of arthropathy or other focal bone abnormality. Soft tissues are unremarkable.  IMPRESSION: No acute abnormality noted.   Electronically Signed  By: Inez Catalina M.D.  On: 01/12/2016 15:40    Study Result     CLINICAL DATA: Knee pain for several months, no known injury, initial encounter  EXAM: RIGHT KNEE - COMPLETE 4+ VIEW  COMPARISON: None.  FINDINGS: Mild medial joint space narrowing is seen. Osteophytic changes are noted in the medial and lateral joint spaces. No sizable effusion is seen. No acute fracture is noted.  IMPRESSION: Degenerative change without acute abnormality.   Electronically Signed  By: Inez Catalina M.D.  On: 01/12/2016 15:41   Study Result     CLINICAL DATA: History of breast cancer now with palpable lump involving the right lateral aspect of the neck.  EXAM: ULTRASOUND OF HEAD/NECK SOFT TISSUES  TECHNIQUE: Ultrasound examination of the head and neck soft tissues was performed in the area of clinical concern.  COMPARISON: None.  FINDINGS: The patient's palpable area of concern correlates with a  nonenlarged and benign-appearing cervical lymph node which measures approximately 0.8 cm in greatest short axis diameter and maintains a benign fatty hilum (image 7).  Note is also made of an adjacent benign-appearing cervical lymph node which too is not enlarged by size criteria measuring 0.3 cm in diameter (image 14).  No additional discrete solid or cystic masses correlate with the patient's palpable area of concern.  IMPRESSION: Two benign-appearing and non pathologically enlarged cervical lymph nodes correlate with the patient's palpable area of concern within the right lateral neck, nonspecific though favored to be reactive in etiology. Clinical correlation to ensure resolution is recommended.   Electronically Signed  By: Sandi Mariscal M.D.  On: 12/03/2015 06:38    Study Result     CLINICAL DATA: 50 year old female with new diagnosis of breast cancer. Syncope in weakness. Subsequent encounter. Small anterior communicating artery aneurysm on November MRA.  EXAM: MRI HEAD WITHOUT AND WITH CONTRAST  TECHNIQUE: Multiplanar, multiecho pulse sequences of the brain and surrounding structures were obtained without and with intravenous contrast.  CONTRAST: 63m MULTIHANCE GADOBENATE DIMEGLUMINE 529 MG/ML IV SOLN  COMPARISON: Intracranial MRA 09/21/2015. Head CT without contrast 07/17/2015  FINDINGS: No midline shift, mass effect,  or evidence of intracranial mass lesion. No abnormal enhancement identified. Small anterior right frontal lobe developmental venous anomaly (normal anatomic variant series 11, image 70). No dural thickening.  Cerebral volume is within normal limits. No restricted diffusion to suggest acute infarction. No ventriculomegaly, extra-axial collection or acute intracranial hemorrhage. Cervicomedullary junction and pituitary are within normal limits. Negative visualized cervical spine. Pear[place] Cables and white matter signal is within normal  limits for age throughout the brain. Major intracranial vascular flow voids are preserved.  Visible internal auditory structures appear normal. Mastoids are clear. Mild mostly left maxillary sinus paranasal sinus mucosal thickening. Negative orbit and scalp soft tissues. Visualized bone marrow signal is within normal limits.  IMPRESSION: 1. No acute or metastatic intracranial abnormality. 2. Tiny anterior communicating artery aneurysm described in November is only evident by MRA.   Electronically Signed  By: Genevie Ann M.D.  On: 11/16/2015 08:50    PATHOLOGY:     ASSESSMENT and THERAPY PLAN:  High Grade DCIS R breast ER- PR- Negative Sentinel LN Right breast seed localized partial mastectomy with right axillary sentinel lymph node mapping Dr. Brantley Stage 10/13/2015 Adjuvant XRT Anxiety about health  Patient was seen at the breast [facility] in [place] by Dr. Jana Hakim: As far as medical oncology follow-up she understands she is not a candidate for anti-estrogens and we do not do chemotherapy for ductal carcinoma in situ tumors. She does warrant follow-up and we generally see patients with ductal carcinoma in situ once a year for 5 years. She would prefer to have medical oncology follow-up in [employer] and we will refer her to our partner Dr. Whitney Muse there, probably to see the patient February or so by which time all her local treatment should be completed.  I have discussed referring her to allergy clinic given her past history of allergies and current complaints of intermittent "rash" .  She is going to meet with Elzie Rings with the survivorship clinic as soon as possible.   We will check labs today including inflammatory markers.  She has multiple "complaints" and seem to have had a thorough evaluation by her PCP. I will keep her apprised of the results of blood work when available.   She will return in 3 months for a follow up.   All questions were answered. The patient knows to  call the clinic with any problems, questions or concerns. We can certainly see the patient much sooner if necessary.  This document serves as a record of services personally performed by Ancil Linsey, MD. It was created on her behalf by Kandace Blitz, a trained medical scribe. The creation of this record is based on the scribe's personal observations and the provider's statements to them. This document has been checked and approved by the attending provider.  I have reviewed the above documentation for accuracy and completeness, and I agree with the above.  This note was electronically signed. Molli Hazard, MD  01/26/2016

## 2016-01-26 NOTE — Patient Instructions (Addendum)
[place] at [facility] Discharge Instructions  RECOMMENDATIONS MADE BY THE CONSULTANT AND ANY TEST RESULTS WILL BE SENT TO YOUR REFERRING PHYSICIAN.   Exam and discussion by Dr Whitney Muse today Radiation done. Will introduce you into the survivorship clinic, it will help you so much.Social worker will be here at the same day you see Elzie Rings with the survivorship clinic. Will call you with lab results later as they come in. Return to see the doctor in 3 months Referral to [facility] with surviorship soon, will call you with appointment. Please call the clinic if you have any questions or concerns   Thank you for choosing [place] at [facility] to provide your oncology and hematology care.  To afford each patient quality time with our provider, please arrive at least 15 minutes before your scheduled appointment time.   Beginning January 23rd 2017 lab work for the [facility] will be done in the  Main lab at [facility] on 1st floor. If you have a lab appointment with the [facility] please come in thru the  Main Entrance and check in at the main information desk  You need to re-schedule your appointment should you arrive 10 or more minutes late.  We strive to give you quality time with our providers, and arriving late affects you and other patients whose appointments are after yours.  Also, if you no show three or more times for appointments you may be dismissed from the clinic at the providers discretion.     Again, thank you for choosing [facility].  Our hope is that these requests will decrease the amount of time that you wait before being seen by our physicians.       _____________________________________________________________  Should you have questions after your visit to [facility], please contact our office at [phone] between the hours of 8:30 a.m. and 4:30 p.m.  Voicemails left after 4:30 p.m. will  not be returned until the following business day.  For prescription refill requests, have your pharmacy contact our office.         Resources For Cancer Patients and their Caregivers ? American Cancer Society: Can assist with transportation, wigs, general needs, runs Look Good Feel Better.        (858)782-8348 ? Cancer Care: Provides financial assistance, online support groups, medication/co-pay assistance.  1-800-813-HOPE (302)738-5225) ? [place] Assists [place] cancer patients and their families through emotional , educational and financial support.  (509)264-0638 ? [place] DSS Where to apply for food stamps, Medicaid and utility assistance. (727)670-5646 ? [facility]: Transportation to medical appointments. 703-012-7182 ? Social Security Administration: May apply for disability if have a Stage IV cancer. 858-069-2742 2172719394 ? [employer], Disability and Transit Services: Assists with nutrition, care and transit needs. 615-105-4832

## 2016-01-27 ENCOUNTER — Telehealth (HOSPITAL_COMMUNITY): Payer: Self-pay | Admitting: *Deleted

## 2016-01-27 LAB — IGG, IGA, IGM
IGA: 467 mg/dL — AB (ref 87–352)
IGG (IMMUNOGLOBIN G), SERUM: 1045 mg/dL (ref 700–1600)
IgM, Serum: 69 mg/dL (ref 26–217)

## 2016-01-27 NOTE — Telephone Encounter (Signed)
[facility] called and requested tests added to specimen from 01/26/2016.

## 2016-01-27 NOTE — Telephone Encounter (Signed)
-----   Message from Patrici Ranks, MD sent at 01/27/2016  8:36 AM EDT ----- All labs ok except mild elevation of IGA would check SPEP/IEP, light chains. Dr.P

## 2016-01-30 LAB — KAPPA/LAMBDA LIGHT CHAINS
Kappa free light chain: 32.13 mg/L — ABNORMAL HIGH (ref 3.30–19.40)
Kappa, lambda light chain ratio: 1.93 — ABNORMAL HIGH (ref 0.26–1.65)
LAMDA FREE LIGHT CHAINS: 16.65 mg/L (ref 5.71–26.30)

## 2016-02-14 ENCOUNTER — Ambulatory Visit (HOSPITAL_COMMUNITY)
Admission: RE | Admit: 2016-02-14 | Discharge: 2016-02-14 | Disposition: A | Discharge status: Home / Self Care | Payer: BC Managed Care – PPO | Source: Ambulatory Visit | Attending: Adult Health | Admitting: Adult Health

## 2016-02-14 ENCOUNTER — Encounter: Payer: Self-pay | Admitting: Adult Health

## 2016-02-14 ENCOUNTER — Encounter (HOSPITAL_COMMUNITY): Payer: Self-pay | Admitting: Lab

## 2016-02-14 ENCOUNTER — Encounter: Payer: Self-pay | Admitting: *Deleted

## 2016-02-14 ENCOUNTER — Other Ambulatory Visit (HOSPITAL_COMMUNITY): Payer: Self-pay | Admitting: Adult Health

## 2016-02-14 ENCOUNTER — Other Ambulatory Visit: Payer: Self-pay | Admitting: Adult Health

## 2016-02-14 ENCOUNTER — Encounter (HOSPITAL_COMMUNITY): Payer: Self-pay | Admitting: Adult Health

## 2016-02-14 ENCOUNTER — Encounter (HOSPITAL_BASED_OUTPATIENT_CLINIC_OR_DEPARTMENT_OTHER): Payer: BC Managed Care – PPO | Admitting: Adult Health

## 2016-02-14 VITALS — BP 120/90 | HR 59 | Temp 98.0°F | Resp 14

## 2016-02-14 DIAGNOSIS — C50311 Malignant neoplasm of lower-inner quadrant of right female breast: Secondary | ICD-10-CM | POA: Diagnosis present

## 2016-02-14 DIAGNOSIS — F418 Other specified anxiety disorders: Secondary | ICD-10-CM

## 2016-02-14 DIAGNOSIS — N63 Unspecified lump in unspecified breast: Secondary | ICD-10-CM

## 2016-02-14 DIAGNOSIS — D0512 Intraductal carcinoma in situ of left breast: Secondary | ICD-10-CM | POA: Diagnosis not present

## 2016-02-14 DIAGNOSIS — Z171 Estrogen receptor negative status [ER-]: Secondary | ICD-10-CM

## 2016-02-14 DIAGNOSIS — K5900 Constipation, unspecified: Secondary | ICD-10-CM

## 2016-02-14 NOTE — Progress Notes (Signed)
Ultrasound and diagnostic mammogram of right breast completed. Findings suggest hematoma/seroma and radiology is planning to do an aspiration for symptomatic relief.  According to the report, the patient is aware of these results.    Therefore, she does not need additional follow-up in survivorship, unless deemed appropriate by Dr. Whitney Muse or Kirby Crigler, PA-C.    Mike Craze, NP [place] [phone]

## 2016-02-14 NOTE — Patient Instructions (Signed)
-  Get your right breast ultrasound and/or mammogram today.  I will call you with the results/next steps. -I will have Dr. Donald Pore team be in touch with you about your recent lab work and any further work-up you may need. -If you decide to do the [facility] class in [place], just call and get registered.   I enjoyed meeting with you and your husband today. Please call me with any questions!  Mike Craze, NP Survivorship Program [phone]

## 2016-02-14 NOTE — Progress Notes (Signed)
Sarah Cooley, [place] [address]   CLINIC:  Survivorship  REASON FOR VISIT:  Survivorship Care Plan visit & to address acute survivorship needs with Survivorship NP & Oncology Clinical Social Worker   BRIEF ONCOLOGY HISTORY:  Oncology History   Stage 0: Right breast DCIS, high-grade, ER/PR negative     Breast cancer of lower-inner quadrant of right female breast ([place])   08/19/2015 Breast US Masslike asymmetry within the lower inner quadrant of the right breast, at posterior depth, with associated microcalcifications which are new.    08/23/2015 Initial Biopsy AT LEAST HIGH DUCTAL CARCINOMA IN SITU   08/23/2015 Receptors her2 Estrogen Receptor: 0%, NEGATIVE Progesterone Receptor: 0%, NEGATIVE   08/26/2015 Initial Diagnosis Breast cancer of lower-inner quadrant of right female breast ([place])   08/31/2015 Procedure GeneDx negative.  Genes tested include: ATM, BRCA1, BRCA2, CDH1, CHEK2, PALB2, PTEN, & TP53.    10/13/2015 Pathologic Stage pTis, pN0: Stage 0    10/13/2015 Surgery Right lumpectomy & SLNB ([name]). High grade DCIS, 0.4 cm. Negative margins.  1 right axillary SLN neg. ER/PR repeated and both remain negative.    11/17/2015 - 12/21/2015 Radiation Therapy Treated at [facility] [place]). Right breast: Total dose 50 Gy in 25 fractions. 3-D tangents; 6 MV photons.      INTERVAL HISTORY:  Sarah Cooley presents today for her initial survivorship visit at the request of Dr. Whitney Cooley.  She tells me that she has been worried about her multiple medical conditions; with every new symptom she is concerned "is this something else I'm going to have to deal with?"    Her current physical symptoms include: joint and muscle pain (h/o fibromyalgia), fatigue, changes in bowel/bladder (stress incontinence and constipation), insomnia, and new right breast lump that is very painful.  Regarding the new breast complaints, she denies any trauma to her breast.  She  reports that the area of concern is "right behind my scar."  The lump has gotten bigger over the last 2 weeks.    Her emotional/social/spiritual concerns include: fear of cancer recurrence, financial concerns, anxiety, feeling depressed, difficulty coping, worried about loved ones, changes in body/self-esteem, and changes in my ability to work.   ADDITIONAL REVIEW OF SYSTEMS:  Review of Systems  Constitutional: Positive for malaise/fatigue.       H/o Chronic fatigue syndrome   HENT:       H/o many neurologic complaints; seen by several neurologists   Gastrointestinal: Positive for constipation.  Musculoskeletal: Positive for myalgias and joint pain.       H/o fibromyalgia   Skin:       -New mass in the right breast; very painful; has been getting larger and more tender for the past 2 weeks.   -Silvadene cream helped heal radiation dermatitis.    Neurological: Positive for headaches.  Psychiatric/Behavioral: Positive for depression and memory loss. The patient is nervous/anxious.        Chronic issues with anxiety, depression; concerns about lack of concentration and short-term memory concerns.      PAST MEDICAL & SURGICAL HISTORY:  Past Medical History  Diagnosis Date  . Fibromyalgia   . Depression   . Hypertension   . Hyperlipidemia   . Breast cancer ([place])   . Fibromyalgia   . Arthritis   . Hyperlipemia   . Family history of breast cancer   . Family history of colon cancer   . Diabetes mellitus without complication ([place])  not on medication at this time  . Occasional tremors   . Balance problem   . OSA (obstructive sleep apnea)     uses cpap with humidification  . Anxiety   . H/O acute renal failure 09/22/15    ad[facility] to [facility]  . History of hiatal hernia   . GERD (gastroesophageal reflux disease)     none recent  . Headache     occipital and temporal  . Elevated liver function tests   . Irritable bowel syndrome (IBS)   . Constipation   .  Complication of anesthesia     heart rate drops and O2 sats drop  . PONV (postoperative nausea and vomiting)   . Family history of adverse reaction to anesthesia     mom has n/v  . Brain aneurysm     2 mm ACA region aneurysm by 09/21/15 MRA   Past Surgical History  Procedure Laterality Date  . Bone graft hip iliac crest    . Abdominal hysterectomy    . Bladder surgery    . Knee arthroscopy w/ acl reconstruction      right  . Cholecystectomy    . Fracture surgery Right     wrist  . Radioactive seed guided mastectomy with axillary sentinel lymph node biopsy Right 10/13/2015    Procedure: RADIOACTIVE SEED GUIDED PARTIAL MASTECTOMY WITH AXILLARY SENTINEL LYMPH NODE BIOPSY;  Surgeon: Erroll Luna, MD;  Location: [facility];  Service: General;  Laterality: Right;     SOCIAL HISTORY: Sarah Cooley lives with her husband, Sarah Cooley, in [place], [place].  They have 2 grown children who live outside the home.  She has been seeing a counselor to help with her stress/anxiety/depression; she has been seeing them about every 2 weeks for the past 1-2 months.  She is currently out of work, previously worked as a Administrator at an Production assistant, radio in [place].  She voices that she loves her work and has a Mining engineer and work environment.  She is not currently on short-term disability or FMLA.  Her husband is on FMLA to help care for his wife.  She has been worried about finances lately; they recently re-financed their truck to help pay their bills.  She does not smoke.  For fun, she likes to do crafts, crochet, and play with her therapy/companion dog, Sarah Cooley.  They also have a new grandchild who is 61 months old.   *Oncology social worker, Sarah Racer, LCSW, was present during the visit today.   CURRENT MEDICATIONS:  Current Outpatient Prescriptions on File Prior to Visit  Medication Sig Dispense Refill  . Aloe Vera GEL Apply 1 application topically daily as needed (for  skin-right breast).    Marland Kitchen amLODipine (NORVASC) 5 MG tablet Take 5 mg by mouth daily.    . Azilsartan Medoxomil (EDARBI) 80 MG TABS Take 80 mg by mouth daily.    . clonazePAM (KLONOPIN) 1 MG tablet Take 1 mg by mouth See admin instructions. Take 1 tablet (1 mg) by mouth every morning and at night, may take an additional tablet mid-day if needed for anxiety    . docusate sodium (COLACE) 100 MG capsule Take 100 mg by mouth daily as needed for mild constipation.    Marland Kitchen HYDROcodone-acetaminophen (NORCO/VICODIN) 5-325 MG per tablet Take 0.5 tablets by mouth every 6 (six) hours as needed for moderate pain. Reported on 12/10/2015    . ibuprofen (ADVIL,MOTRIN) 200 MG tablet Take 800 mg by mouth 2 (  two) times daily as needed (pain).     . mupirocin cream (BACTROBAN) 2 %     . oxyCODONE-acetaminophen (ROXICET) 5-325 MG tablet Take 1-2 tablets by mouth every 4 (four) hours as needed. (Patient taking differently: Take 1-2 tablets by mouth every 4 (four) hours as needed for moderate pain or severe pain. ) 30 tablet 0  . silver sulfADIAZINE (SILVADENE) 1 % cream Apply 1 application topically 2 (two) times daily. 50 g 1  . traZODone (DESYREL) 50 MG tablet Take 150 mg by mouth at bedtime.     . triamcinolone (NASACORT ALLERGY 24HR) 55 MCG/ACT AERO nasal inhaler Place 2 sprays into both nostrils daily as needed (Allergies).     . Vitamin D, Ergocalciferol, (DRISDOL) 50000 UNITS CAPS capsule Take 50,000 Units by mouth 2 (two) times a week. Monday and Thursday    . vitamin E 400 UNIT capsule Take 400 Units by mouth at bedtime.     . Vortioxetine HBr (TRINTELLIX) 5 MG TABS Take 5 mg by mouth daily.     No current facility-administered medications on file prior to visit.    ALLERGIES: Allergies  Allergen Reactions  . Shrimp [Shellfish Allergy] Anaphylaxis and Swelling  . Bactrim [Sulfamethoxazole-Trimethoprim] Other (See Comments)    May have caused kidney issues  . Erythromycin Other (See Comments)    GI- Upset /  severe abdominal pain  . Minocycline Hcl Hives  . Ramipril Other (See Comments)    Ramipril--Caused Pancreatitis  . Adhesive [Tape] Rash    Please use paper tape  . Drixoral [Brompheniramine-Pseudoeph] Rash  . Sinutab [Chlorphen-Pseudoephed-Apap] Rash    PHYSICAL EXAM:  Filed Vitals:   02/14/16 1230  BP: 120/90  Pulse: 59  Temp: 98 F (36.7 C)  Resp: 14    General: Female in no acute distress.  Accompanied by her husband, Sarah Cooley, today.    HEENT: Head is normocephalic.  Pupils equal and reactive to light. Conjunctivae clear without exudate.  Sclerae anicteric.  Lymph: No cervical, supraclavicular, infraclavicular, or axillary lymphadenopathy noted on palpation.   Breast Exam:  -Left breast: No palpable masses. No skin changes or nipple retraction. Normal fibrocystic changes palpated. -Right breast: Large, firm, but mobile mass measuring about 5 cm x 5 cm posterior to the areola and adjacent to intramammary fold. Healed lumpectomy scar on areola; partially inverted nipple.  Tender to palpation. Slightly arm to touch. (+) peau d'orange appearance to skin with mild erythema from about 4 o'clock to about 8 o'clock. Cardiovascular: Normal rate and rhythm Respiratory: Clear to auscultation bilaterally. Chest expansion symmetric without accessory muscle use. Breathing non-labored.    GU: Deferred.   GI: Soft, non-tender abdomen. Normoactive bowel sounds.  Neuro: No focal deficits. Steady gait.    Psych: Slightly flat affect, but responds appropriately to questions. Emotions/mood appropriate.  Extremities: No edema.  Skin: Warm and dry.   LABORATORY DATA: None for this visit.   DIAGNOSTIC IMAGING:  None for this visit.    ASSESSMENT & PLAN:  Sarah Cooley is a pleasant 50 y.o. female with history of Stage 0 (TisN0M0) DCIS of the right breast, treated with lumpectomy and radiation therapy; completed treatment on 12/21/15.  Patient presents to survivorship clinic today for survivorship care  plan visit and to address any acute survivorship concerns since completing treatment.    1. Right breast DCIS: Sarah Cooley is continuing to heal from definitive treatment for Stage 0 right breast DCIS.  She does have a new breast mass, that I have a  low suspicion for this being cancer given her breast cancer was non-invasive. (see #2 below).  Today, she received a copy of her survivorship care plan (SCP) document, which was reviewed with her in detail.  The SCP details her cancer treatment history and potential late/long-term side effects of those treatments.  We discussed the follow-up schedule she can anticipate with interval imaging for surveillance of her cancer.  I have also shared a copy of her treatment summary/SCP with her PCP.  Sarah Cooley will return to the survivorship clinic as needed; she will return to [facility] for surveillance visit with Dr. Whitney Cooley in 04/2016.    2. New right breast mass: Sarah Cooley has a new breast mass that she noted to be increasing in size for the past 2 weeks. On physical exam, it measures about 5 cm x 5 cm and is located behind the nipple.  I have low suspicion that this is a new breast cancer given she had non-invasive breast cancer (DCIS) and completed both lumpectomy with negative margins and adjuvant radiation therapy.  The differential for this likely benign breast mass could be hematoma, cystic mass, mastitis, etc.  I sent her for a STAT breast ultrasound and diagnostic mammogram of right breast at [facility] to further evaluate.     3. Anxiety/Depression:  Sarah Cooley's mental illnesses have been managed by her PCP, Delia Chimes, NP who is also board-certified as a psych-mental health NP as well per the patient.  She has a great relationship with her PCP and feels that she is well-cared for.  I encouraged her to continue to see her counselor/therapist as it seems she is doing some meaningful work and making progress.  In comparison to when  I meet the patient about 1.5 months ago, she seems more joyful today and was often smiling during our visit.  I am hopeful the combination of pharmacologic and therapy support will optimize her continued issues with anxiety and depression.   4. Constipation: Encouraged her to try OTC Miralax to help with her constipation. She is rarely taking the Hydrocodone (prescribed to her by her PCP). I stressed the importance of maintaining an adequate bowel regimen, particularly in the setting of opiate use.    5. Physical activity/Healthy eating: Getting adequate physical activity and maintaining a healthy diet as a cancer survivor is important for overall wellness and reduces the risk of cancer recurrence. We discussed the [facility], which is a fitness program that is offered to cancer survivors free of charge.  She is eligible to participate at the 3 locations in [place]; the [facility] does not yet participate in [place] per my knowledge.  We also reviewed "The Nutrition Rainbow" handout, which provides recommendations for different fruits/vegetables to eat and the anti-cancer properties they may provide.   6. Health promotion/Cancer screening:  Sarah Cooley is reportedly up-to-date on her skin screenings and vaccinations.  She understands that she needs a colonoscopy now that she is 50 years old; her PCP is helping her get that scheduled.  She is unsure if she needs pap smears as she had a hysterectomy and is unsure if she has a cervix.  She would like a referral to [facility] to establish care there; she asked for this particular practice.   I have placed that referral today.  I encouraged her to talk with his PCP about arranging other appropriate cancer screening tests, as appropriate.   7. Support services/Counseling: Ms.  Cooley was seen today in conjunction with Sarah Racer, LCSW, in an effort to address both the physical and social concerns of our cancer  survivors at [facility].  (Please see LCSW note for additional documentation & recommendations).  It is not uncommon for this period of the patient's cancer care trajectory to be one of many emotions and stressors.  I provided support today through active listening, validation of concerns, and expressive supportive counseling.  Sarah Cooley was encouraged to take advantage of our support services programs and support groups to better cope in her new life as a cancer survivor after completing anti-cancer treatment. The "Finding Your New Normal" [facility]) support group series offered at the [facility] cancer center may be helpful as she continues to cope with the emotional effects of a cancer diagnosis.  She is willing to drive to [facility] for this program & was encouraged to contact the coordinator to participate if she decides to attend.    8. Return to work: Sarah Cooley would like to return to work and needed a note today. She has no physical restrictions that I am aware of to perform her job duties, as they relate to her history of breast cancer.  I did request that she return to work for 1/2 days for the first 2 weeks (max 4 hours/day) to help her become re-acclimated to her work environment given her other co-morbidities.  A letter was provided to the patient stating this.  I also provided a work excuse for the patient's husband for attending this appointment with his wife today.    Dispo:  -Pending results of breast ultrasound and mammogram, I will see the patient as needed for additional follow-up.  Dr. Whitney Cooley is aware of the patient's new physical exam findings and pending work-up/evaluation.   -Consider transitioning the patient to long-term survivorship, when clinically appropriate.   A total of 60 minutes was spent in face-to-face care of this patient, with greater than 50% of that time spent in counseling and care coordination.    Mike Craze, NP Survivorship Program [place] [phone]

## 2016-02-14 NOTE — Progress Notes (Signed)
Referral sent to [facility].  appt 5/16@1120 .  Patient aware of aware , called to let know on 4/25 @1504 

## 2016-02-15 ENCOUNTER — Other Ambulatory Visit (HOSPITAL_COMMUNITY): Payer: Self-pay | Admitting: Adult Health

## 2016-02-15 DIAGNOSIS — N63 Unspecified lump in unspecified breast: Secondary | ICD-10-CM

## 2016-02-15 DIAGNOSIS — C50311 Malignant neoplasm of lower-inner quadrant of right female breast: Secondary | ICD-10-CM

## 2016-02-15 NOTE — Progress Notes (Signed)
[facility] SURVIV[facility] CLINICAL SOCIAL WORK PSYCHOSOCIAL ASSESSMENT   Date:  02/14/16   First Name: Sarah Rosamond.: H     Last Name: Alviar MRN:  [id number]  The patient met with LCSW to assess for psychosocial, emotional, spiritual, and practical needs.    Primary Cancer Type: Breast cancer of lower-inner quadrant of right female breast [facility])   Staging form: Breast, AJCC 7th Edition     Clinical stage from 08/31/2015: Stage 0 (Tis (DCIS), N0, M0) - Unsigned       Staging comments: Staged at breast conference on 11.9.16      Pathologic stage from 10/13/2015: Stage 0 (Tis (DCIS), N0, cM0) - Signed by Holley Bouche, NP on 02/09/2016         Marital Status: Married  Practical Problems: yes   Financial, care giving of elderly parent   Employment:  currently employed, on leave with no pay Pt currently employed as a Psychologist, 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 She appears to love her job and takes pride in her work. She described a busy and challenging work environment. She appears eager to return to work, but has concerns about her abilities to work at her previous pace.   Source of Income: Employment Both husband and pt are employed. Pt currently on leave with no pay. 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  They rely on two incomes to make ends meet and had to refinance their truck recently.     Insurance: BCBS   Family Problems: YES Concerns caring for family needs: yes  Pt provides some care giving for her elderly parents, this has become more of a challenge for the extended family due to her's and various family members' illnesses. Family appears to have this issue under control, but is an added source of stress on pt and her family.  Emotional Problems: yes  Concerns of Adjustment to Diagnosis/Treatment: yes   Current Symptoms of Anxiety: yes   Current Symptoms of Depression: yes  Safety/Risk Concerns: no   Pt reports fear of cancer recurrence, anxiety,  feeling depressed and difficulty coping. Pt appears to be in a "better place" than she was a few months ago. She is regularly attending counseling with a therapist at [facility] in [place]. She reports to go once a week or every other week. She shared she was keeping a Banker and had recently ordered a book,  "Boundaries" at the request of her therapist. She appears to be experiencing very common emotions due to adjustment to illness. LCSW and NP provided supportive listening and education on these common emotions.  Mental Status Exam Orientation: person, place, and time Affect: appropriate Thought: goal directed  Pt appears to have short term and long term goals identified and is working towards achievement of these goals.       Spiritual/Religious: None identified  Living Situation: with spouse  Functional Status: Independent                                                                                            Strengths and Barriers:   Patient Coping  Strengths:  Supportive Relationships, Family, Friends, Financial controller, Hopefulness, Conservator, museum/gallery and Able to Communicate Effectively    Pt appears able to self advocate and has done a great job of addressing her concerns with her providers. She is well connected to resources and supports. She is very open to additional sources of support. She has self identified activities and behaviors that reduce her anxiety.                                                                                               Identified Problems/Needs and Barriers to Care:  Illness-related problems, Financial, Adjustment to Illness, Emotional/Behavioral/Cognitive and Employment/School/Career Concerns Pt reluctant to seek assistance for financial concerns. CSW provided education today on possible sources for support to assist with financial concerns. Pt eager to return to work in order to address her financial concerns. She  appears to have a very thorough plan to continue to address her emotional concerns through her therapist and PCP.     Counseling and Social Work Interventions and Recommendations:   Brief Counseling/Psychotherapy, Data processing manager, Support Group/Group Counseling and Referral to community mental health provider Pt plans to continue additional therapy with established therapist. Pt very open to additional options for support. She agrees to appropriate referrals.  She is open to attending [facility], Finding Your New Normal for additional assistance in the transition to survivorship. CSW also encouraged attendance at [facility] events for additional support by the local cancer community.     Impressions/Plan: Pt was able to clearly self advocate and express her emotions related to her cancer. She appeared to have very appropriate emotions and common reactions that need additional validation and support. Pt very open to attending [facility]. CSW will have class organizer contact pt further about this class. CSW feels pt will benefit from connection with other breast cancer survivors. Pt also encouraged to attend Support Programs through the cancer center. CSW provided her with handouts that should serve as facilitating her attendance at these events.    Pt has great connections for resources to address her depression and anxiety. CSW impressed with the work pt has completed to date to address this areas of her life.   Clinical Social Worker follow up needed: Yes.   If yes, follow up plan: CSW making referral to [facility] program. CSW plans to contact pt via phone to check in, remind pt of support group date/time. Pt aware to reach out to CSW as needed for additional support/needs.   Loren Racer, [place] Tuesdays   [phone]

## 2016-02-16 ENCOUNTER — Telehealth: Payer: Self-pay | Admitting: Adult Health

## 2016-02-16 ENCOUNTER — Other Ambulatory Visit: Payer: Self-pay | Admitting: Adult Health

## 2016-02-16 DIAGNOSIS — C50311 Malignant neoplasm of lower-inner quadrant of right female breast: Secondary | ICD-10-CM

## 2016-02-16 NOTE — Telephone Encounter (Signed)
I spoke with Sarah Cooley to follow-up on how she was feeling after having the right breast seroma drained at [facility] earlier this week.  She thinks Dr. Donne Hazel was the doctor who drained the seroma.  She tells me that clear, yellow fluid came out of the mass and Dr. Donne Hazel was not going to send the specimen for cytology because it appeared to be normal serous fluid.  He told the patient that she should have a repeat ultrasound next week to assess for re-filling.  I have placed those orders for her to have ultrasound right breast done at [facility] sometime next week.   She tells me that her breast is sore, but the mass is much smaller. She is monitoring it closely.  She asked when the "dots that look like an orange on my breast" would go away. I encouraged her to continue to monitor that, but it will likely dissipate after the inflammation in the breast resolves in the coming days/week.  She voiced appreciation to everyone at the cancer center for taking such good care of her and thanked me for calling her.  I offered her brief emotional support and encouraged her to have a great week as she returns to work with abbreviated hours next week.  I encouraged her to call me with any other questions or concerns.   Mike Craze, NP [place] [phone]

## 2016-02-17 ENCOUNTER — Encounter: Payer: Self-pay | Admitting: Hematology & Oncology

## 2016-02-21 ENCOUNTER — Telehealth: Payer: Self-pay | Admitting: Adult Health

## 2016-02-21 ENCOUNTER — Ambulatory Visit (HOSPITAL_COMMUNITY): Admission: RE | Admit: 2016-02-21 | Payer: BC Managed Care – PPO | Source: Ambulatory Visit

## 2016-02-21 ENCOUNTER — Ambulatory Visit (HOSPITAL_COMMUNITY)
Admission: RE | Admit: 2016-02-21 | Discharge: 2016-02-21 | Disposition: A | Discharge status: Home / Self Care | Payer: BC Managed Care – PPO | Source: Ambulatory Visit | Attending: Adult Health | Admitting: Adult Health

## 2016-02-21 DIAGNOSIS — C50311 Malignant neoplasm of lower-inner quadrant of right female breast: Secondary | ICD-10-CM | POA: Insufficient documentation

## 2016-02-21 DIAGNOSIS — N63 Unspecified lump in unspecified breast: Secondary | ICD-10-CM

## 2016-02-21 DIAGNOSIS — N6489 Other specified disorders of breast: Secondary | ICD-10-CM | POA: Diagnosis not present

## 2016-02-21 NOTE — Telephone Encounter (Signed)
I received a call from Sarah Cooley with questions regarding next steps after she completed her follow-up ultrasound today which showed that the fluid had re-accumulated in the right breast seroma she had drained last week.   Sarah Cooley expresses anxiety and frustration with not feeling like she has a good plan in place for how best to manage this seroma.  She tells me that [facility] offered to send her to the [facility] to have it drained there and she agreed to do that, but wanted to get my advice if that was the right choice of what she should do.    I let her know that since she is continuing to have pain in the breast with the re-accumulation of fluid, that I would definitely recommend having it drained at [facility], where they have additional equipment/imaging options to ensure it is drained completely.  We discussed that sometimes it is not possible to completely drain a seroma for a variety of reasons, but I felt confident that [facility] would take good care of her.  I encouraged her to ask questions of the staff at [facility] on how best she should manage the seroma for the long-term, as she tells me that she cannot have appointments to have it drained every week.  I offered understanding and expressive supportive counseling briefly over the phone.  She stated that she felt better before hanging up and thanked me for speaking with her.  I encouraged her to call me with any other questions or concerns.    Mike Craze, NP [facility] [phone]

## 2016-02-22 ENCOUNTER — Other Ambulatory Visit: Payer: Self-pay | Admitting: Surgery

## 2016-02-22 DIAGNOSIS — T792XXA Traumatic secondary and recurrent hemorrhage and seroma, initial encounter: Secondary | ICD-10-CM

## 2016-02-23 ENCOUNTER — Other Ambulatory Visit (HOSPITAL_COMMUNITY): Payer: Self-pay | Admitting: Hematology & Oncology

## 2016-02-23 DIAGNOSIS — D8989 Other specified disorders involving the immune mechanism, not elsewhere classified: Secondary | ICD-10-CM

## 2016-02-26 ENCOUNTER — Encounter (HOSPITAL_COMMUNITY): Payer: Self-pay | Admitting: Hematology & Oncology

## 2016-02-27 ENCOUNTER — Other Ambulatory Visit: Payer: BC Managed Care – PPO

## 2016-02-27 ENCOUNTER — Ambulatory Visit
Admission: RE | Admit: 2016-02-27 | Discharge: 2016-02-27 | Disposition: A | Discharge status: Home / Self Care | Payer: BC Managed Care – PPO | Source: Ambulatory Visit | Attending: Surgery | Admitting: Surgery

## 2016-02-27 DIAGNOSIS — T792XXA Traumatic secondary and recurrent hemorrhage and seroma, initial encounter: Secondary | ICD-10-CM

## 2016-02-28 ENCOUNTER — Other Ambulatory Visit: Payer: Self-pay | Admitting: Radiology

## 2016-02-28 ENCOUNTER — Other Ambulatory Visit: Payer: Self-pay | Admitting: General Surgery

## 2016-02-29 ENCOUNTER — Ambulatory Visit (HOSPITAL_COMMUNITY)
Admission: RE | Admit: 2016-02-29 | Discharge: 2016-02-29 | Disposition: A | Discharge status: Home / Self Care | Payer: BC Managed Care – PPO | Source: Ambulatory Visit | Attending: Hematology & Oncology | Admitting: Hematology & Oncology

## 2016-02-29 ENCOUNTER — Encounter (HOSPITAL_COMMUNITY): Payer: Self-pay

## 2016-02-29 DIAGNOSIS — Z853 Personal history of malignant neoplasm of breast: Secondary | ICD-10-CM | POA: Diagnosis not present

## 2016-02-29 DIAGNOSIS — M797 Fibromyalgia: Secondary | ICD-10-CM | POA: Diagnosis not present

## 2016-02-29 DIAGNOSIS — D890 Polyclonal hypergammaglobulinemia: Secondary | ICD-10-CM | POA: Diagnosis not present

## 2016-02-29 DIAGNOSIS — Z803 Family history of malignant neoplasm of breast: Secondary | ICD-10-CM | POA: Diagnosis not present

## 2016-02-29 DIAGNOSIS — D72822 Plasmacytosis: Secondary | ICD-10-CM | POA: Insufficient documentation

## 2016-02-29 DIAGNOSIS — R7989 Other specified abnormal findings of blood chemistry: Secondary | ICD-10-CM | POA: Diagnosis not present

## 2016-02-29 DIAGNOSIS — F419 Anxiety disorder, unspecified: Secondary | ICD-10-CM | POA: Diagnosis not present

## 2016-02-29 DIAGNOSIS — K5900 Constipation, unspecified: Secondary | ICD-10-CM | POA: Insufficient documentation

## 2016-02-29 DIAGNOSIS — K219 Gastro-esophageal reflux disease without esophagitis: Secondary | ICD-10-CM | POA: Insufficient documentation

## 2016-02-29 DIAGNOSIS — G4733 Obstructive sleep apnea (adult) (pediatric): Secondary | ICD-10-CM | POA: Diagnosis not present

## 2016-02-29 DIAGNOSIS — I10 Essential (primary) hypertension: Secondary | ICD-10-CM | POA: Insufficient documentation

## 2016-02-29 DIAGNOSIS — E785 Hyperlipidemia, unspecified: Secondary | ICD-10-CM | POA: Insufficient documentation

## 2016-02-29 DIAGNOSIS — C9000 Multiple myeloma not having achieved remission: Secondary | ICD-10-CM | POA: Insufficient documentation

## 2016-02-29 DIAGNOSIS — D8989 Other specified disorders involving the immune mechanism, not elsewhere classified: Secondary | ICD-10-CM

## 2016-02-29 DIAGNOSIS — Z800 Family history of malignant neoplasm of digestive organs: Secondary | ICD-10-CM | POA: Diagnosis not present

## 2016-02-29 DIAGNOSIS — E119 Type 2 diabetes mellitus without complications: Secondary | ICD-10-CM | POA: Diagnosis not present

## 2016-02-29 DIAGNOSIS — I671 Cerebral aneurysm, nonruptured: Secondary | ICD-10-CM | POA: Diagnosis not present

## 2016-02-29 DIAGNOSIS — M1990 Unspecified osteoarthritis, unspecified site: Secondary | ICD-10-CM | POA: Insufficient documentation

## 2016-02-29 DIAGNOSIS — F329 Major depressive disorder, single episode, unspecified: Secondary | ICD-10-CM | POA: Diagnosis not present

## 2016-02-29 DIAGNOSIS — R2689 Other abnormalities of gait and mobility: Secondary | ICD-10-CM | POA: Diagnosis not present

## 2016-02-29 LAB — CBC
HCT: 41 % (ref 36.0–46.0)
Hemoglobin: 14.1 g/dL (ref 12.0–15.0)
MCH: 30.7 pg (ref 26.0–34.0)
MCHC: 34.4 g/dL (ref 30.0–36.0)
MCV: 89.3 fL (ref 78.0–100.0)
PLATELETS: 205 10*3/uL (ref 150–400)
RBC: 4.59 MIL/uL (ref 3.87–5.11)
RDW: 12.5 % (ref 11.5–15.5)
WBC: 5.1 10*3/uL (ref 4.0–10.5)

## 2016-02-29 LAB — APTT: aPTT: 32 seconds (ref 24–37)

## 2016-02-29 LAB — PROTIME-INR
INR: 0.98 (ref 0.00–1.49)
PROTHROMBIN TIME: 13.1 s (ref 11.6–15.2)

## 2016-02-29 LAB — BONE MARROW EXAM

## 2016-02-29 MED ORDER — FENTANYL CITRATE (PF) 100 MCG/2ML IJ SOLN
INTRAMUSCULAR | Status: AC
  Filled 2016-02-29: qty 2

## 2016-02-29 MED ORDER — MIDAZOLAM HCL 2 MG/2ML IJ SOLN
INTRAMUSCULAR | Status: AC
  Filled 2016-02-29: qty 4

## 2016-02-29 MED ORDER — SODIUM CHLORIDE 0.9 % IV SOLN
INTRAVENOUS | Status: DC
  Administered 2016-02-29: 07:00:00 via INTRAVENOUS

## 2016-02-29 MED ORDER — FENTANYL CITRATE (PF) 100 MCG/2ML IJ SOLN
INTRAMUSCULAR | Status: AC | PRN
  Administered 2016-02-29: 50 ug via INTRAVENOUS
  Administered 2016-02-29 (×2): 25 ug via INTRAVENOUS

## 2016-02-29 MED ORDER — MIDAZOLAM HCL 2 MG/2ML IJ SOLN
INTRAMUSCULAR | Status: AC | PRN
  Administered 2016-02-29 (×2): 0.5 mg via INTRAVENOUS
  Administered 2016-02-29: 1 mg via INTRAVENOUS
  Administered 2016-02-29: 0.5 mg via INTRAVENOUS

## 2016-02-29 NOTE — Sedation Documentation (Signed)
Patient denies pain and is resting comfortably.  

## 2016-02-29 NOTE — Procedures (Signed)
Interventional Radiology Procedure Note  Procedure: CT guided aspirate and core biopsy of right iliac bone Complications: None Recommendations: - Bedrest supine x 1 hr - Follow biopsy results  Cayetano Mikita T. Kynlee Koenigsberg, M.D Pager:  [phone]   

## 2016-02-29 NOTE — Discharge Instructions (Signed)
Moderate Conscious Sedation, Adult, Care After °Refer to this sheet in the next few weeks. These instructions provide you with information on caring for yourself after your procedure. Your health care provider may also give you more specific instructions. Your treatment has been planned according to current medical practices, but problems sometimes occur. Call your health care provider if you have any problems or questions after your procedure. °WHAT TO EXPECT AFTER THE PROCEDURE  °After your procedure: °· You may feel sleepy, clumsy, and have poor balance for several hours. °· Vomiting may occur if you eat too soon after the procedure. °HOME CARE INSTRUCTIONS °· Do not participate in any activities where you could become injured for at least 24 hours. Do not: °¨ Drive. °¨ Swim. °¨ Ride a bicycle. °¨ Operate heavy machinery. °¨ Cook. °¨ Use power tools. °¨ Climb ladders. °¨ Work from a high place. °· Do not make important decisions or sign legal documents until you are improved. °· If you vomit, drink water, juice, or soup when you can drink without vomiting. Make sure you have little or no nausea before eating solid foods. °· Only take over-the-counter or prescription medicines for pain, discomfort, or fever as directed by your health care provider. °· Make sure you and your family fully understand everything about the medicines given to you, including what side effects may occur. °· You should not drink alcohol, take sleeping pills, or take medicines that cause drowsiness for at least 24 hours. °· If you smoke, do not smoke without supervision. °· If you are feeling better, you may resume normal activities 24 hours after you were sedated. °· Keep all appointments with your health care provider. °SEEK MEDICAL CARE IF: °· Your skin is pale or bluish in color. °· You continue to feel nauseous or vomit. °· Your pain is getting worse and is not helped by medicine. °· You have bleeding or swelling. °· You are still  sleepy or feeling clumsy after 24 hours. °SEEK IMMEDIATE MEDICAL CARE IF: °· You develop a rash. °· You have difficulty breathing. °· You develop any type of allergic problem. °· You have a fever. °MAKE SURE YOU: °· Understand these instructions. °· Will watch your condition. °· Will get help right away if you are not doing well or get worse. °  °This information is not intended to replace advice given to you by your health care provider. Make sure you discuss any questions you have with your health care provider. °  °Document Released: 07/29/2013 Document Revised: 10/29/2014 Document Reviewed: 07/29/2013 °Elsevier Interactive Patient Education ©2016 [employer] °Bone Marrow Aspiration and Bone Marrow Biopsy, Care After °Refer to this sheet in the next few weeks. These instructions provide you with information about caring for yourself after your procedure. Your health care provider may also give you more specific instructions. Your treatment has been planned according to current medical practices, but problems sometimes occur. Call your health care provider if you have any problems or questions after your procedure. °WHAT TO EXPECT AFTER THE PROCEDURE °After your procedure, it is common to have: °· Soreness or tenderness around the puncture site. °· Bruising. °HOME CARE INSTRUCTIONS °· Take medicines only as directed by your health care provider. °· Follow your health care provider's instructions about: °¨ Puncture site care. °¨ Bandage (dressing) changes and removal. °· Bathe and shower as directed by your health care provider. °· Check your puncture site every day for signs of infection. Watch for: °¨ Redness, swelling, or pain. °¨   Fluid, blood, or pus. °· Return to your normal activities as directed by your health care provider. °· Keep all follow-up visits as directed by your health care provider. This is important. °SEEK MEDICAL CARE IF: °· You have a fever. °· You have uncontrollable bleeding. °· You have  redness, swelling, or pain at the site of your puncture. °· You have fluid, blood, or pus coming from your puncture site. °  °This information is not intended to replace advice given to you by your health care provider. Make sure you discuss any questions you have with your health care provider. °  °Document Released: 04/27/2005 Document Revised: 02/22/2015 Document Reviewed: 09/29/2014 °Elsevier Interactive Patient Education ©2016 Elsevier Inc. ° °

## 2016-02-29 NOTE — Consult Note (Signed)
Chief Complaint: Patient was seen in consultation today for CT-guided bone marrow biopsy  Referring Physician(s): Penland,Shannon K  Supervising Physician: Aletta Edouard  Patient Status:  Out-pt  History of Present Illness: Sarah Cooley is a 50 y.o. female with past medical history including breast cancer who presents now with abnormal serum protein electrophoresis with polyclonal gammopathy and abnormal kappa/lambda ratio. Request now made for CT guided bone marrow biopsy for further evaluation.   Past Medical History  Diagnosis Date  . Fibromyalgia   . Depression   . Hypertension   . Hyperlipidemia   . Breast cancer ([place])   . Fibromyalgia   . Arthritis   . Hyperlipemia   . Family history of breast cancer   . Family history of colon cancer   . Diabetes mellitus without complication ([place])     not on medication at this time  . Occasional tremors   . Balance problem   . OSA (obstructive sleep apnea)     uses cpap with humidification  . Anxiety   . H/O acute renal failure 09/22/15    admitted to [facility]  . History of hiatal hernia   . GERD (gastroesophageal reflux disease)     none recent  . Headache     occipital and temporal  . Elevated liver function tests   . Irritable bowel syndrome (IBS)   . Constipation   . Complication of anesthesia     heart rate drops and O2 sats drop  . PONV (postoperative nausea and vomiting)   . Family history of adverse reaction to anesthesia     mom has n/v  . Brain aneurysm     2 mm ACA region aneurysm by 09/21/15 [facility]    Past Surgical History  Procedure Laterality Date  . Bone graft hip iliac crest    . Abdominal hysterectomy    . Bladder surgery    . Knee arthroscopy w/ acl reconstruction      right  . Cholecystectomy    . Fracture surgery Right     wrist  . Radioactive seed guided mastectomy with axillary sentinel lymph node biopsy Right 10/13/2015    Procedure: RADIOACTIVE SEED GUIDED PARTIAL  MASTECTOMY WITH AXILLARY SENTINEL LYMPH NODE BIOPSY;  Surgeon: Erroll Luna, MD;  Location: [facility];  Service: General;  Laterality: Right;    Allergies: Shrimp; Bactrim; Erythromycin; Minocycline hcl; Ramipril; Adhesive; Drixoral; and Sinutab  Medications: Prior to Admission medications   Medication Sig Start Date End Date Taking? Authorizing Provider  amLODipine (NORVASC) 5 MG tablet Take 5 mg by mouth daily. 12/07/15  Yes Historical Provider, MD  clonazePAM (KLONOPIN) 1 MG tablet Take 1 mg by mouth See admin instructions. Take 1 tablet (1 mg) by mouth every morning and at night, may take an additional tablet mid-day if needed for anxiety   Yes Historical Provider, MD  ibuprofen (ADVIL,MOTRIN) 200 MG tablet Take 800 mg by mouth 2 (two) times daily as needed (pain).    Yes Historical Provider, MD  vitamin E 400 UNIT capsule Take 400 Units by mouth at bedtime.    Yes Historical Provider, MD  Vortioxetine HBr (TRINTELLIX) 5 MG TABS Take 5 mg by mouth daily.   Yes Historical Provider, MD  Aloe Vera GEL Apply 1 application topically daily as needed (for skin-right breast).    Historical Provider, MD  Azilsartan Medoxomil (EDARBI) 80 MG TABS Take 80 mg by mouth daily.    Historical Provider, MD  docusate sodium (COLACE)  100 MG capsule Take 100 mg by mouth daily as needed for mild constipation.    Historical Provider, MD  HYDROcodone-acetaminophen (NORCO/VICODIN) 5-325 MG per tablet Take 0.5 tablets by mouth every 6 (six) hours as needed for moderate pain. Reported on 12/10/2015 05/10/15   Historical Provider, MD  mupirocin cream (BACTROBAN) 2 %  12/20/15   Historical Provider, MD  oxyCODONE-acetaminophen (ROXICET) 5-325 MG tablet Take 1-2 tablets by mouth every 4 (four) hours as needed. Patient taking differently: Take 1-2 tablets by mouth every 4 (four) hours as needed for moderate pain or severe pain.  10/13/15   Harriette Bouillon, MD  silver sulfADIAZINE (SILVADENE) 1 % cream Apply 1 application  topically 2 (two) times daily. 12/29/15   Allene Pyo, MD  traZODone (DESYREL) 50 MG tablet Take 150 mg by mouth at bedtime.  09/06/15   Historical Provider, MD  triamcinolone (NASACORT ALLERGY 24HR) 55 MCG/ACT AERO nasal inhaler Place 2 sprays into both nostrils daily as needed (Allergies).     Historical Provider, MD  Vitamin D, Ergocalciferol, (DRISDOL) 50000 UNITS CAPS capsule Take 50,000 Units by mouth 2 (two) times a week. Monday and Thursday    Historical Provider, MD     Family History  Problem Relation Age of Onset  . Hypertension Mother   . Autoimmune disease Mother   . Eczema Mother   . Heart disease Maternal Aunt   . Lung cancer Paternal Aunt   . Breast cancer Paternal Aunt     dx in her 72s  . Lung cancer Paternal Grandfather   . Colon cancer Paternal Uncle     dx in her 59s  . Colon cancer Paternal Uncle     dx in his 55s  . Melanoma Father 75  . Psoriasis Father   . Hypertension Father   . Heart disease Father   . Breast cancer Sister     dx in her 30s  . Lupus Sister   . COPD Maternal Uncle   . Colon cancer Paternal Uncle     dx in his 26s    Social History   Social History  . Marital Status: Married    Spouse Name: Aeronautical engineer  . Number of Children: 2  . Years of Education: N/A   Occupational History  . physician office    Social History Main Topics  . Smoking status: Never Smoker   . Smokeless tobacco: Never Used  . Alcohol Use: 0.0 oz/week    0 Standard drinks or equivalent per week     Comment: 2-3 times a year  . Drug Use: No  . Sexual Activity: Yes   Other Topics Concern  . None   Social History Narrative      Review of Systems  Constitutional: Negative for fever and chills.  Respiratory:       Some dyspnea with exertion  Cardiovascular:       Some right lateral chest wall discomfort  Gastrointestinal: Negative for nausea, vomiting, abdominal pain and blood in stool.  Genitourinary: Negative for dysuria and hematuria.    Musculoskeletal: Positive for back pain.  Neurological: Positive for headaches.    Vital Signs: BP 136/91 mmHg  Pulse 75  Temp(Src) 98.8 F (37.1 C) (Oral)  Resp 16  Ht 5\' 8"  (1.727 m)  Wt 218 lb (98.884 kg)  BMI 33.15 kg/m2  SpO2 100%  Physical Exam  Constitutional: She is oriented to person, place, and time. She appears well-developed and well-nourished.  Cardiovascular: Normal rate and regular  rhythm.   Pulmonary/Chest: Effort normal and breath sounds normal.  Abdominal: Soft. Bowel sounds are normal. There is no tenderness.  Obese  Musculoskeletal: She exhibits no edema.  Neurological: She is alert and oriented to person, place, and time.    Mallampati Score:     Imaging: US Aspiration  02/27/2016  CLINICAL DATA:  History of right breast cancer status post lumpectomy 10/13/2015. Patient states that the seroma is painful and request aspiration. EXAM: ULTRASOUND GUIDED RIGHT BREAST CYST ASPIRATION COMPARISON:  Previous exams. PROCEDURE: I palpate a discrete mass in the right breast at 3 o'clock 3 cm from the nipple. There is no erythema of the breast. Sonographic evaluation of the right breast was performed prior to aspiration. In the 3 o'clock region the right breast 3 cm from the nipple is a septated 4.3 x 3.9 x 3.7 cm septated anechoic seroma. Sonographic evaluation the right axilla does not show any enlarged adenopathy. Complications including infection, bleeding and reaccumulation of fluid were discussed prior to aspiration. The patient wished to continue with aspiration. Using sterile technique, 1% lidocaine, under direct ultrasound visualization, needle aspiration of the seroma in the 3 o'clock region of the right breast was performed. Approximately 35 cc of clear yellow fluid was aspirated. There were no clinical signs of infection and the fluid was not purulent therefore not sent for culture. IMPRESSION: Ultrasound-guided aspiration of a right breast seroma No apparent  complications. RECOMMENDATIONS: Bilateral diagnostic mammogram in January of 2017 is recommended. Electronically Signed   By: Lillia Mountain M.D.   On: 02/27/2016 16:14   US Breast Ltd Uni Right Inc Axilla  02/21/2016  CLINICAL DATA:  50 year old patient status post lumpectomy for right breast cancer in December 2016. She was recently seen in April 2017, as she describes she had discomfort at a palpable lump near the lumpectomy site. A postoperative seroma versus hematoma was described on the mammogram and ultrasound report of 02/14/2016. The patient states that she subsequently underwent aspiration of the palpable mass at [facility]. Today's ultrasound is ordered to evaluate for possible re-accumulation of fluid at the postsurgical site. The patient states that she does palpate a lump and has continued discomfort in the right breast. EXAM: ULTRASOUND OF THE RIGHT BREAST COMPARISON:  02/14/2016 and 08/23/2015 FINDINGS: On physical exam, there is a palpable lump in the periareolar right breast that can be felt from the 12 to 3 o'clock region periareolar. Targeted ultrasound is performed, showing an oval mildly complex cystic mass in the periareolar right breast. Images are labeled as 3 o'clock position. This complex cyst measures 3.7 x 4.6 x 3.2 cm. On the ultrasound from 02/14/2016 a complex cystic mass in this region measured 4.9 x 4.1 x 4.0 cm. IMPRESSION: Mildly complex fluid collection in the lumpectomy site of the 3 o'clock position right breast periareolar. Given report of recent aspiration, findings are consistent with re-accumulation of a seroma at the lumpectomy site. RECOMMENDATION: Clinical management. Imaging follow-up as needed. The patient will be due for her annual bilateral mammogram in October 2017. The patient brought to my attention today that she has not had a mammogram of the contralateral left breast since January of 2016. I reviewed our records and confirmed this. I offered to  obtain a physician's order and have the left mammogram performed today, but due to other obligations the patient did not have the time to stay today. She states that she will follow-up with her physician about obtaining order for a left mammogram. I  have discussed the findings and recommendations with the patient. Results were also provided in writing at the conclusion of the visit. If applicable, a reminder letter will be sent to the patient regarding the next appointment. BI-RADS CATEGORY  2: Benign. Electronically Signed   By: Curlene Dolphin M.D.   On: 02/21/2016 16:03   US Breast Ltd Uni Right Inc Axilla  02/14/2016  CLINICAL DATA:  History of treated right breast cancer, status post lumpectomy and radiation therapy in late 2016. EXAM: 2D DIGITAL DIAGNOSTIC RIGHT MAMMOGRAM WITH CAD AND ADJUNCT TOMO ULTRASOUND RIGHT BREAST COMPARISON:  Previous exam(s). ACR Breast Density Category b: There are scattered areas of fibroglandular density. FINDINGS: Right breast mammography demonstrates a large circumscribed mass at the site of lumpectomy in the lower inner quadrant, middle depth. Postsurgical changes are also noted in the axilla. There is slight skin thickening in the lower more than inner quadrant of the breast. Mammographic images were processed with CAD. On physical exam, there is a firm approximately 5 cm palpable mass centered at 3 o'clock in the right breast. Slight skin induration is noted inferiorly. Targeted ultrasound is performed, showing a complex fluid collection centered in the right 3 o'clock breast 2 cm from the nipple demonstrating internal septations and floating debris. No internal vascularity is noted. The finding measures 4.0 by 4.9 by 4.1 cm, and likely represents postsurgical organizing hematoma/seroma. IMPRESSION: Right breast probable organizing hematoma/ seroma, for which aspiration is recommended for symptoms relief. RECOMMENDATION: Ultrasound-guided aspiration of the right breast. I  have discussed the findings and recommendations with the patient. Results were also provided in writing at the conclusion of the visit. If applicable, a reminder letter will be sent to the patient regarding the next appointment. BI-RADS CATEGORY  3: Probably benign. Electronically Signed   By: Fidela Salisbury M.D.   On: 02/14/2016 14:44   Mm Diag Breast Tomo Uni Right  02/14/2016  CLINICAL DATA:  History of treated right breast cancer, status post lumpectomy and radiation therapy in late 2016. EXAM: 2D DIGITAL DIAGNOSTIC RIGHT MAMMOGRAM WITH CAD AND ADJUNCT TOMO ULTRASOUND RIGHT BREAST COMPARISON:  Previous exam(s). ACR Breast Density Category b: There are scattered areas of fibroglandular density. FINDINGS: Right breast mammography demonstrates a large circumscribed mass at the site of lumpectomy in the lower inner quadrant, middle depth. Postsurgical changes are also noted in the axilla. There is slight skin thickening in the lower more than inner quadrant of the breast. Mammographic images were processed with CAD. On physical exam, there is a firm approximately 5 cm palpable mass centered at 3 o'clock in the right breast. Slight skin induration is noted inferiorly. Targeted ultrasound is performed, showing a complex fluid collection centered in the right 3 o'clock breast 2 cm from the nipple demonstrating internal septations and floating debris. No internal vascularity is noted. The finding measures 4.0 by 4.9 by 4.1 cm, and likely represents postsurgical organizing hematoma/seroma. IMPRESSION: Right breast probable organizing hematoma/ seroma, for which aspiration is recommended for symptoms relief. RECOMMENDATION: Ultrasound-guided aspiration of the right breast. I have discussed the findings and recommendations with the patient. Results were also provided in writing at the conclusion of the visit. If applicable, a reminder letter will be sent to the patient regarding the next appointment. BI-RADS  CATEGORY  3: Probably benign. Electronically Signed   By: Fidela Salisbury M.D.   On: 02/14/2016 14:44    Labs:  CBC:  Recent Labs  08/31/15 1217 10/12/15 1122 01/26/16 1021 02/29/16 0652  WBC  8.1 6.6 5.4 5.1  HGB 15.5 13.4 13.2 14.1  HCT 45.4 39.0 38.2 41.0  PLT 253 213 175 205    COAGS:  Recent Labs  02/29/16 0652  INR 0.98  APTT 32    BMP:  Recent Labs  07/17/15 1555 07/17/15 2104 07/18/15 0508 08/31/15 1217 10/12/15 1122 01/26/16 1021  NA 137  --  138 138 143 139  K 3.3*  --  3.4* 2.6* 3.6 3.6  CL 101  --  102  --  107 107  CO2 26  --  _0 GLUCOSE 98  --  93 114 105* 94  BUN 13  --  12 10._1 CALCIUM 9.0  --  8.3* 9.7 9.5 8.8*  CREATININE 0.71 0.72 0.79 0.8 1.07* 0.83  GFRNONAA >60 >60 >60  --  60* >60  GFRAA >60 >60 >60  --  >60 >60    LIVER FUNCTION TESTS:  Recent Labs  07/18/15 0508 08/31/15 1217 10/12/15 1122 01/26/16 1021  BILITOT 0.7 0.64 0.7 0.4  AST _2 ALT 36 37 22 21  ALKPHOS 54 70 59 55  PROT 6.9 7.8 7.0 7.3  ALBUMIN 3.7 3.9 3.7 3.9    TUMOR MARKERS: No results for input(s): AFPTM, CEA, CA199, CHROMGRNA in the last 8760 hours.  Assessment and Plan: Patient with past history of breast cancer, now with abnormal serum protein electrophoresis/ polyclonal gammopathy / abnormal kappa/lambda ratio. She presents today for CT-guided bone marrow biopsy for further evaluation.Risks and benefits discussed with the patient/husband including, but not limited to bleeding, infection, damage to adjacent structures or low yield requiring additional tests.All of the patient's questions were answered, patient is agreeable to proceed.Consent signed and in chart.     Thank you for this interesting consult.  I greatly enjoyed meeting SELMA MINK and look forward to participating in their care.  A copy of this report was sent to the requesting provider on this date.  Electronically Signed: D. Rowe Robert 02/29/2016,  8:25 AM   I spent a total of 20 minutes in face to face in clinical consultation, greater than 50% of which was counseling/coordinating care for CT guided bone marrow biopsy

## 2016-03-06 ENCOUNTER — Ambulatory Visit (HOSPITAL_COMMUNITY): Payer: BC Managed Care – PPO | Admitting: Adult Health

## 2016-03-09 LAB — CHROMOSOME ANALYSIS, BONE MARROW

## 2016-03-14 ENCOUNTER — Encounter (HOSPITAL_COMMUNITY): Payer: Self-pay

## 2016-03-14 ENCOUNTER — Other Ambulatory Visit (HOSPITAL_COMMUNITY): Payer: Self-pay

## 2016-03-15 ENCOUNTER — Ambulatory Visit (HOSPITAL_COMMUNITY)
Admission: RE | Admit: 2016-03-15 | Discharge: 2016-03-15 | Disposition: A | Discharge status: Home / Self Care | Payer: BC Managed Care – PPO | Source: Ambulatory Visit | Attending: Hematology & Oncology | Admitting: Hematology & Oncology

## 2016-03-15 ENCOUNTER — Encounter (HOSPITAL_COMMUNITY): Payer: BC Managed Care – PPO | Attending: Hematology & Oncology | Admitting: Hematology & Oncology

## 2016-03-15 ENCOUNTER — Encounter (HOSPITAL_COMMUNITY): Payer: Self-pay | Admitting: Hematology & Oncology

## 2016-03-15 VITALS — BP 132/80 | HR 77 | Temp 97.8°F | Resp 16 | Wt 223.9 lb

## 2016-03-15 DIAGNOSIS — M21831 Other specified acquired deformities of right forearm: Secondary | ICD-10-CM | POA: Diagnosis not present

## 2016-03-15 DIAGNOSIS — D472 Monoclonal gammopathy: Secondary | ICD-10-CM

## 2016-03-15 DIAGNOSIS — F419 Anxiety disorder, unspecified: Secondary | ICD-10-CM | POA: Diagnosis not present

## 2016-03-15 DIAGNOSIS — D0511 Intraductal carcinoma in situ of right breast: Secondary | ICD-10-CM

## 2016-03-15 DIAGNOSIS — C50311 Malignant neoplasm of lower-inner quadrant of right female breast: Secondary | ICD-10-CM | POA: Insufficient documentation

## 2016-03-15 DIAGNOSIS — M47894 Other spondylosis, thoracic region: Secondary | ICD-10-CM | POA: Insufficient documentation

## 2016-03-15 DIAGNOSIS — R5382 Chronic fatigue, unspecified: Secondary | ICD-10-CM | POA: Insufficient documentation

## 2016-03-15 DIAGNOSIS — F418 Other specified anxiety disorders: Secondary | ICD-10-CM

## 2016-03-15 HISTORY — DX: Monoclonal gammopathy: D47.2

## 2016-03-15 NOTE — Progress Notes (Signed)
[facility] at [place], NP [address] / [place] [address]  DIAGNOSIS: Breast cancer of lower-inner quadrant of right female breast Sarah Cooley)   Staging form: Breast, AJCC 7th Edition     Clinical stage from 08/31/2015: Stage 0 (Tis (DCIS), N0, M0) - Unsigned       Staging comments: Staged at breast conference on 11.9.16    SUMMARY OF ONCOLOGIC HISTORY: Oncology History   Stage 0: Right breast DCIS, high-grade, ER/PR negative     Breast cancer of lower-inner quadrant of right female breast (Sarah Cooley)   08/19/2015 Breast US Masslike asymmetry within the lower inner quadrant of the right breast, at posterior depth, with associated microcalcifications which are new.    08/23/2015 Initial Biopsy AT LEAST HIGH DUCTAL CARCINOMA IN SITU   08/23/2015 Receptors her2 Estrogen Receptor: 0%, NEGATIVE Progesterone Receptor: 0%, NEGATIVE   08/26/2015 Initial Diagnosis Breast cancer of lower-inner quadrant of right female breast (Sarah Cooley)   08/31/2015 Procedure GeneDx negative.  Genes tested include: ATM, BRCA1, BRCA2, CDH1, CHEK2, PALB2, PTEN, & TP53.    10/13/2015 Pathologic Stage pTis, pN0: Stage 0    10/13/2015 Surgery Right lumpectomy & SLNB (Sarah Cooley). High grade DCIS, 0.4 cm. Negative margins.  1 right axillary SLN neg. ER/PR repeated and both remain negative.    11/17/2015 - 12/21/2015 Radiation Therapy Treated at [facility] [place]). Right breast: Total dose 50 Gy in 25 fractions. 3-D tangents; 6 MV photons.     CURRENT THERAPY:  INTERVAL HISTORY: Sarah Cooley 50 y.o. female returns for follow-up of R breast cancer. She also returns today for discussion of recent BMBX for abnormal kappa/lambda light chain ratio.  Sarah Cooley returns to the [name] today unaccompanied.  She notes that she's been extremely tired. She has a lot of questions about MGUS and her recent lab studies (flow cytometry, bone marrow, etc.). She also  describes having a bone density test that came back well.  Sarah Cooley describes having severe constipation until she started taking Miralax, but she recently stopped taking the Miralax due to "severe rectal itching." She notes she hasn't taken it in a week. She indicates her right breast and says that, due to pain, she's had it drained twice, and wanted to have it drained again recently but was met with resistance. She says she still has pain down through her armpit/side, and mentiosn that she wouldn't worry about draining it if she wasn't experiencing pain. She has a R breast seroma.  She notes that the place they biopsied her for bone marrow is still "really sore." She also adds that her knees, legs, and "everything" has been swelling. She comments that she has had a cyst with "extreme swelling" in her legs, and also remarks that sometimes her hands so sore she can't close them.  She is back at work and notes that it is very tiring.   MEDICAL HISTORY: Past Medical History  Diagnosis Date  . Fibromyalgia   . Depression   . Hypertension   . Hyperlipidemia   . Breast cancer ([place])   . Fibromyalgia   . Arthritis   . Hyperlipemia   . Family history of breast cancer   . Family history of colon cancer   . Diabetes mellitus without complication ([place])     not on medication at this time  . Occasional tremors   . Balance problem   . OSA (obstructive sleep apnea)  uses cpap with humidification  . Anxiety   . H/O acute renal failure 09/22/15    admitted to [facility]  . History of hiatal hernia   . GERD (gastroesophageal reflux disease)     none recent  . Headache     occipital and temporal  . Elevated liver function tests   . Irritable bowel syndrome (IBS)   . Constipation   . Complication of anesthesia     heart rate drops and O2 sats drop  . PONV (postoperative nausea and vomiting)   . Family history of adverse reaction to anesthesia     mom has n/v  . Brain aneurysm       2 mm ACA region aneurysm by 09/21/15 MRA    has Headache; Elevated troponin; Hypertension; Obesity; Pain in the chest; Cephalalgia; Breast cancer of lower-inner quadrant of right female breast ([place]); Family history of breast cancer; Family history of colon cancer; and Genetic testing on her problem list.     is allergic to shrimp; bactrim; erythromycin; minocycline hcl; ramipril; adhesive; drixoral; and sinutab.  SURGICAL HISTORY: Past Surgical History  Procedure Laterality Date  . Bone graft hip iliac crest    . Abdominal hysterectomy    . Bladder surgery    . Knee arthroscopy w/ acl reconstruction      right  . Cholecystectomy    . Fracture surgery Right     wrist  . Radioactive seed guided mastectomy with axillary sentinel lymph node biopsy Right 10/13/2015    Procedure: RADIOACTIVE SEED GUIDED PARTIAL MASTECTOMY WITH AXILLARY SENTINEL LYMPH NODE BIOPSY;  Surgeon: Erroll Luna, MD;  Location: [facility] OR;  Service: General;  Laterality: Right;    SOCIAL HISTORY: Social History   Social History  . Marital Status: Married    Spouse Name: Psychologist, clinical  . Number of Children: 2  . Years of Education: N/A   Occupational History  . physician office    Social History Main Topics  . Smoking status: Never Smoker   . Smokeless tobacco: Never Used  . Alcohol Use: 0.0 oz/week    0 Standard drinks or equivalent per week     Comment: 2-3 times a year  . Drug Use: No  . Sexual Activity: Yes   Other Topics Concern  . 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 The patient's husband, [name], is a Curator. The patient has 2 children from a prior marriage, Duard Larsen who lives in [place] and is disabled secondary to bipolar disorder; and Melvenia Needles, who lives in [place] and works as a Curator and equal treat him. The patient has 2 grandchildren and one on the way. [name] has a  daughter from an earlier marriage Cyril Mourning who works as an Therapist, sports in [place]. Dewanna attends a [employer].  GYNECOLOGIC HISTORY:  No LMP recorded. Patient has had a hysterectomy. Menarche age 4, first live birth at 3. The patient is GX P2. She underwent partial hysterectomy, without salpingo-oophorectomy, remotely. She did not take hormone replacement. She did use oral contraceptives for approximately 3 years in her 53P, with no complications    FAMILY HISTORY: Family History  Problem Relation Age of Onset  . Hypertension Mother   . Autoimmune disease Mother   . Eczema Mother   . Heart disease Maternal Aunt   . Lung cancer Paternal Aunt   . Breast cancer Paternal Aunt  dx in her 63s  . Lung cancer Paternal Grandfather   . Colon cancer Paternal Uncle     dx in her 21s  . Colon cancer Paternal Uncle     dx in his 87s  . Melanoma Father 67  . Psoriasis Father   . Hypertension Father   . Heart disease Father   . Breast cancer Sister     dx in her 74s  . Lupus Sister   . COPD Maternal Uncle   . Colon cancer Paternal Uncle     dx in his 64s  The patient's parents are living, in their mid 21s. The patient has one brother, and one sister. On the paternal side, the patient's father had melanoma diagnosed in his 31s. The paternal grandfather, who died at the age of 2, had a history of lung and colon cancer. There are 2 uncles with colon cancer and one with lung cancer as well as one aunt on the paternal side with breast cancer diagnosed in her late 64s. The patient's only sister underwent lumpectomy at the age of 65, but the patient does not know the exact results.  Review of Systems  Constitutional: Positive for malaise/fatigue.       Wears glasses.  Chronic fatigue   HENT: Negative.   Eyes: Negative.   Respiratory: Negative.   Cardiovascular: Positive for chest pain.       Is worse when she has a breaking out episode.   Gastrointestinal: Negative.     Genitourinary: Negative.   Musculoskeletal: Positive for back pain and falls.       Back and ankle pain. [place] recently. Falls from balance issues.  Skin: Positive for rash. Negative for itching.       Enlarged lymph node in the neck. Palpable abnormality in the right breast. Breaking out on knees and face. Face gets red but is not itchy.  Neurological: Positive for tremors.       Hand tremor. Balance issues. Issues focusing (loses train of thought frequently)  Endo/Heme/Allergies: Negative.   Psychiatric/Behavioral: Positive for depression. The patient is nervous/anxious.        Has been having coordination and concentration issues.  All other systems reviewed and are negative. 14 point review of systems was performed and is negative except as detailed under history of present illness and above    PHYSICAL EXAMINATION  ECOG PERFORMANCE STATUS: 1 - Symptomatic but completely ambulatory  Filed Vitals:   03/15/16 0938  BP: 132/80  Pulse: 77  Temp: 97.8 F (36.6 C)  Resp: 16    Physical Exam  Constitutional: She is oriented to person, place, and time and well-developed, well-nourished, and in no distress.  HENT:  Head: Normocephalic and atraumatic.  Nose: Nose normal.  Mouth/Throat: Oropharynx is clear and moist. No oropharyngeal exudate.  Eyes: Conjunctivae and EOM are normal. Pupils are equal, round, and reactive to light. Right eye exhibits no discharge. Left eye exhibits no discharge. No scleral icterus.  Neck: Normal range of motion. Neck supple. No tracheal deviation present. No thyromegaly present.  Cardiovascular: Normal rate, regular rhythm and normal heart sounds.  Exam reveals no gallop and no friction rub.   No murmur heard. Pulmonary/Chest: Effort normal and breath sounds normal. She has no wheezes. She has no rales.  Abdominal: Soft. Bowel sounds are normal. She exhibits no distension and no mass. There is no tenderness. There is no rebound and no guarding.   Musculoskeletal: Normal range of motion. She exhibits no edema.  Lymphadenopathy:    She has no cervical adenopathy.  Neurological: She is alert and oriented to person, place, and time. She has normal reflexes. No cranial nerve deficit. Gait normal. Coordination normal.  Skin: Skin is warm and dry. No rash noted.  Psychiatric:  Anxiety, depression  Nursing note and vitals reviewed.   LABORATORY DATA: I have reviewed the data as listed.           PATHOLOGY:  Bone Marrow Biopsy      Flow Cytometry   Lumpectomy     ASSESSMENT and THERAPY PLAN:  High Grade DCIS R breast ER- PR- Negative Sentinel LN Right breast seed localized partial mastectomy with right axillary sentinel lymph node mapping Dr. Brantley Stage 10/13/2015 Adjuvant XRT Anxiety about health MGUS, light chain  I discussed MGUS with the patient. We reviewed her bone marrow pathology. I would just follow her labs every 6 months or so. We will do a bone survey. We can order urine studies at follow-up.   In regards to her breast cancer diagnosis. 6 month interval follow-up will also be appropriate. We will discuss this at her return in July.  She continues to have a lot of anxiety about her health. I have encouraged her to continue to work, will address working with her counselor more and/or support groups moving forward.  Orders Placed This Encounter  Procedures  . DG Bone Survey Met    JH/ESIGNED    Standing Status: Future     Number of Occurrences: 1     Standing Expiration Date: 05/16/2017    Order Specific Question:  Reason for Exam (SYMPTOM  OR DIAGNOSIS REQUIRED)    Answer:  light chain MGUS    Order Specific Question:  Preferred imaging location?    Answer:  [facility]    Order Specific Question:  Is the patient pregnant?    Answer:  No    All questions were answered. The patient knows to call the clinic with any problems, questions or concerns. We can certainly see the patient much  sooner if necessary.  This document serves as a record of services personally performed by Ancil Linsey, MD. It was created on her behalf by Toni Amend, a trained medical scribe. The creation of this record is based on the scribe's personal observations and the provider's statements to them. This document has been checked and approved by the attending provider.  I have reviewed the above documentation for accuracy and completeness, and I agree with the above.  This note was electronically signed. Molli Hazard, MD  03/15/2016

## 2016-03-15 NOTE — Patient Instructions (Signed)
[place] at [facility] Discharge Instructions  RECOMMENDATIONS MADE BY THE CONSULTANT AND ANY TEST RESULTS WILL BE SENT TO YOUR REFERRING PHYSICIAN.   We will call you tomorrow. Return in July.  Thank you for choosing [place] at [facility] to provide your oncology and hematology care.  To afford each patient quality time with our provider, please arrive at least 15 minutes before your scheduled appointment time.   Beginning January 23rd 2017 lab work for the [facility] will be done in the  Main lab at [facility] on 1st floor. If you have a lab appointment with the [facility] please come in thru the  Main Entrance and check in at the main information desk  You need to re-schedule your appointment should you arrive 10 or more minutes late.  We strive to give you quality time with our providers, and arriving late affects you and other patients whose appointments are after yours.  Also, if you no show three or more times for appointments you may be dismissed from the clinic at the providers discretion.     Again, thank you for choosing [facility]  Our hope is that these requests will decrease the amount of time that you wait before being seen by our physicians.       _____________________________________________________________  Should you have questions after your visit to [facility], please contact our office at [phone] between the hours of 8:30 a.m. and 4:30 p.m.  Voicemails left after 4:30 p.m. will not be returned until the following business day.  For prescription refill requests, have your pharmacy contact our office.         Resources For Cancer Patients and their Caregivers ? American Cancer Society: Can assist with transportation, wigs, general needs, runs Look Good Feel Better.        (304)705-9432 ? Cancer Care: Provides financial assistance, online support groups, medication/co-pay  assistance.  1-800-813-HOPE (754)678-6621) ? [facility] Assists [place] cancer patients and their families through emotional , educational and financial support.  202-133-9239 ? [place] DSS Where to apply for food stamps, Medicaid and utility assistance. 413-513-5273 ? [facility]: Transportation to medical appointments. (229)529-9230 ? Social Security Administration: May apply for disability if have a Stage IV cancer. 380-350-8177 (980)291-1816 ? [employer], Disability and Transit Services: Assists with nutrition, care and transit needs. [place] Support Programs: @10RELATIVEDAYS @ > Cancer Support Group  2nd Tuesday of the month 1pm-2pm, Journey Room  > Creative Journey  3rd Tuesday of the month 1130am-1pm, Journey Room  > Look Good Feel Better  1st Wednesday of the month 10am-12 noon, Journey Room (Call [place] [facility] to register [phone])

## 2016-03-26 DIAGNOSIS — M810 Age-related osteoporosis without current pathological fracture: Secondary | ICD-10-CM | POA: Diagnosis not present

## 2016-03-27 DIAGNOSIS — E7800 Pure hypercholesterolemia, unspecified: Secondary | ICD-10-CM | POA: Diagnosis not present

## 2016-03-27 DIAGNOSIS — I10 Essential (primary) hypertension: Secondary | ICD-10-CM | POA: Diagnosis not present

## 2016-03-27 DIAGNOSIS — F419 Anxiety disorder, unspecified: Secondary | ICD-10-CM | POA: Diagnosis not present

## 2016-03-27 DIAGNOSIS — B2700 Gammaherpesviral mononucleosis without complication: Secondary | ICD-10-CM | POA: Diagnosis not present

## 2016-04-02 ENCOUNTER — Telehealth (HOSPITAL_COMMUNITY): Payer: Self-pay

## 2016-04-02 ENCOUNTER — Telehealth (HOSPITAL_COMMUNITY): Payer: Self-pay | Admitting: *Deleted

## 2016-04-02 NOTE — Telephone Encounter (Signed)
Returned patient phone call about multiple problems. 1. Been having nipple discharge since Friday. 2. Did she need to get her PCP to follow her wrist issue. 3. Asked about test results. Spoke to Dr.Penland and she replied with the following and i reported this to the patient.  We can do a mammogram if patient wants too. She can follow up with PCP to follow wrist issues. She can can referred to [facility] if she like for a Rheumatologist.  Patient does not want mammogram and with get PCP to follow up and will discuss other with her husband.

## 2016-04-17 ENCOUNTER — Other Ambulatory Visit (HOSPITAL_COMMUNITY): Payer: Self-pay | Admitting: Nurse Practitioner

## 2016-04-17 DIAGNOSIS — R159 Full incontinence of feces: Secondary | ICD-10-CM | POA: Diagnosis not present

## 2016-04-17 DIAGNOSIS — I729 Aneurysm of unspecified site: Secondary | ICD-10-CM

## 2016-04-17 DIAGNOSIS — Z01818 Encounter for other preprocedural examination: Secondary | ICD-10-CM | POA: Diagnosis not present

## 2016-04-17 DIAGNOSIS — Z1211 Encounter for screening for malignant neoplasm of colon: Secondary | ICD-10-CM | POA: Diagnosis not present

## 2016-04-25 ENCOUNTER — Other Ambulatory Visit (HOSPITAL_COMMUNITY): Payer: Self-pay | Admitting: Nurse Practitioner

## 2016-04-25 ENCOUNTER — Ambulatory Visit (HOSPITAL_COMMUNITY)
Admission: RE | Admit: 2016-04-25 | Discharge: 2016-04-25 | Disposition: A | Discharge status: Home / Self Care | Payer: BC Managed Care – PPO | Source: Ambulatory Visit | Attending: Nurse Practitioner | Admitting: Nurse Practitioner

## 2016-04-25 DIAGNOSIS — I671 Cerebral aneurysm, nonruptured: Secondary | ICD-10-CM | POA: Insufficient documentation

## 2016-04-25 DIAGNOSIS — I729 Aneurysm of unspecified site: Secondary | ICD-10-CM

## 2016-04-25 DIAGNOSIS — S62001A Unspecified fracture of navicular [scaphoid] bone of right wrist, initial encounter for closed fracture: Secondary | ICD-10-CM | POA: Diagnosis not present

## 2016-04-26 ENCOUNTER — Encounter (HOSPITAL_COMMUNITY): Payer: Self-pay | Admitting: Lab

## 2016-04-26 ENCOUNTER — Encounter (HOSPITAL_COMMUNITY): Payer: Self-pay | Admitting: Hematology & Oncology

## 2016-04-26 ENCOUNTER — Encounter (HOSPITAL_COMMUNITY): Payer: BC Managed Care – PPO | Attending: Hematology & Oncology | Admitting: Hematology & Oncology

## 2016-04-26 VITALS — BP 124/82 | HR 84 | Temp 98.7°F | Resp 16 | Wt 223.1 lb

## 2016-04-26 DIAGNOSIS — F418 Other specified anxiety disorders: Secondary | ICD-10-CM

## 2016-04-26 DIAGNOSIS — D472 Monoclonal gammopathy: Secondary | ICD-10-CM

## 2016-04-26 DIAGNOSIS — M25579 Pain in unspecified ankle and joints of unspecified foot: Secondary | ICD-10-CM

## 2016-04-26 DIAGNOSIS — D0511 Intraductal carcinoma in situ of right breast: Secondary | ICD-10-CM

## 2016-04-26 DIAGNOSIS — Z170 Estrogen receptor positive status [ER+]: Secondary | ICD-10-CM

## 2016-04-26 DIAGNOSIS — C50311 Malignant neoplasm of lower-inner quadrant of right female breast: Secondary | ICD-10-CM | POA: Insufficient documentation

## 2016-04-26 DIAGNOSIS — R5382 Chronic fatigue, unspecified: Secondary | ICD-10-CM | POA: Insufficient documentation

## 2016-04-26 DIAGNOSIS — T732XXA Exhaustion due to exposure, initial encounter: Secondary | ICD-10-CM

## 2016-04-26 MED ORDER — ONDANSETRON HCL 8 MG PO TABS: 8.0000 mg | ORAL_TABLET | Freq: Three times a day (TID) | ORAL | Status: DC | PRN

## 2016-04-26 NOTE — Progress Notes (Signed)
[place] at [place], NP [address] / [place] [address]  DIAGNOSIS: Breast cancer of lower-inner quadrant of right female breast [facility])   Staging form: Breast, AJCC 7th Edition     Clinical stage from 08/31/2015: Stage 0 (Tis (DCIS), N0, M0) - Unsigned       Staging comments: Staged at breast conference on 11.9.16    SUMMARY OF ONCOLOGIC HISTORY: Oncology History   Stage 0: Right breast DCIS, high-grade, ER/PR negative     Breast cancer of lower-inner quadrant of right female breast ([place])   08/19/2015 Breast US Masslike asymmetry within the lower inner quadrant of the right breast, at posterior depth, with associated microcalcifications which are new.    08/23/2015 Initial Biopsy AT LEAST HIGH DUCTAL CARCINOMA IN SITU   08/23/2015 Receptors her2 Estrogen Receptor: 0%, NEGATIVE Progesterone Receptor: 0%, NEGATIVE   08/26/2015 Initial Diagnosis Breast cancer of lower-inner quadrant of right female breast ([place])   08/31/2015 Procedure GeneDx negative.  Genes tested include: ATM, BRCA1, BRCA2, CDH1, CHEK2, PALB2, PTEN, & TP53.    10/13/2015 Pathologic Stage pTis, pN0: Stage 0    10/13/2015 Surgery Right lumpectomy & SLNB ([name]). High grade DCIS, 0.4 cm. Negative margins.  1 right axillary SLN neg. ER/PR repeated and both remain negative.    11/17/2015 - 12/21/2015 Radiation Therapy Treated at [facility] [place]). Right breast: Total dose 50 Gy in 25 fractions. 3-D tangents; 6 MV photons.     CURRENT THERAPY: Observation  INTERVAL HISTORY: Sarah Cooley 50 y.o. female returns for follow-up of R breast cancer and light chain MGUS.   Sarah Cooley returns to the [place] today unaccompanied.  She confirms she fell on and broke her right wrist a couple of times in the distant past. We reviewed her prior long bone survey. The patient notes "that may be why it gives me so much trouble."  During the physical  exam, the patient notes that her seroma is better. She comments that there is "still a little bit of a knot behind the nipple," and she confirms that she's been leaving it alone.  She notes that she's had nausea since yesterday, and would like to be called in a prescription for Zofran.  Otherwise she is at her baseline.    MEDICAL HISTORY: Past Medical History  Diagnosis Date  . Fibromyalgia   . Depression   . Hypertension   . Hyperlipidemia   . Breast cancer ([place])   . Fibromyalgia   . Arthritis   . Hyperlipemia   . Family history of breast cancer   . Family history of colon cancer   . Diabetes mellitus without complication ([place])     not on medication at this time  . Occasional tremors   . Balance problem   . OSA (obstructive sleep apnea)     uses cpap with humidification  . Anxiety   . H/O acute renal failure 09/22/15    admitted to [facility]  . History of hiatal hernia   . GERD (gastroesophageal reflux disease)     none recent  . Headache     occipital and temporal  . Elevated liver function tests   . Irritable bowel syndrome (IBS)   . Constipation   . Complication of anesthesia     heart rate drops and O2 sats drop  . PONV (postoperative nausea and vomiting)   . Family history of adverse reaction to anesthesia  mom has n/v  . Brain aneurysm     2 mm ACA region aneurysm by 09/21/15 MRA    has Headache; Elevated troponin; Hypertension; Obesity; Pain in the chest; Cephalalgia; Breast cancer of lower-inner quadrant of right female breast ([place]); Family history of breast cancer; Family history of colon cancer; Genetic testing; and MGUS (monoclonal gammopathy of unknown significance) on her problem list.     is allergic to shrimp; bactrim; erythromycin; minocycline hcl; ramipril; adhesive; drixoral; lyrica; and sinutab.  SURGICAL HISTORY: Past Surgical History  Procedure Laterality Date  . Bone graft hip iliac crest    . Abdominal hysterectomy    . Bladder  surgery    . Knee arthroscopy w/ acl reconstruction      right  . Cholecystectomy    . Fracture surgery Right     wrist  . Radioactive seed guided mastectomy with axillary sentinel lymph node biopsy Right 10/13/2015    Procedure: RADIOACTIVE SEED GUIDED PARTIAL MASTECTOMY WITH AXILLARY SENTINEL LYMPH NODE BIOPSY;  Surgeon: Erroll Luna, MD;  Location: [facility];  Service: General;  Laterality: Right;    SOCIAL HISTORY: Social History   Social History  . Marital Status: Married    Spouse Name: Psychologist, clinical  . Number of Children: 2  . Years of Education: N/A   Occupational History  . physician office    Social History Main Topics  . Smoking status: Never Smoker   . Smokeless tobacco: Never Used  . Alcohol Use: 0.0 oz/week    0 Standard drinks or equivalent per week     Comment: 2-3 times a year  . Drug Use: No  . Sexual Activity: Yes   Other Topics Concern  . 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 The patient's husband, Sarah Cooley, is a Curator. The patient has 2 children from a prior marriage, Sarah Cooley who lives in [place] and is disabled secondary to bipolar disorder; and Sarah Cooley, who lives in [place] and works as a Curator and equal treat him. The patient has 2 grandchildren and one on the way. Sarah Cooley has a daughter from an earlier marriage Sarah Cooley who works as an Therapist, sports in [place]. Bennetta attends a [employer].  GYNECOLOGIC HISTORY:  No LMP recorded. Patient has had a hysterectomy. Menarche age 9, first live birth at 31. The patient is GX P2. She underwent partial hysterectomy, without salpingo-oophorectomy, remotely. She did not take hormone replacement. She did use oral contraceptives for approximately 3 years in her 29J, with no complications    FAMILY HISTORY: Family History  Problem Relation Age of Onset  . Hypertension Mother   .  Autoimmune disease Mother   . Eczema Mother   . Heart disease Maternal Aunt   . Lung cancer Paternal Aunt   . Breast cancer Paternal Aunt     dx in her 36s  . Lung cancer Paternal Grandfather   . Colon cancer Paternal Uncle     dx in her 26s  . Colon cancer Paternal Uncle     dx in his 56s  . Melanoma Father 57  . Psoriasis Father   . Hypertension Father   . Heart disease Father   . Breast cancer Sister     dx in her 58s  . Lupus Sister   . COPD Maternal Uncle   . Colon cancer Paternal Uncle     dx in his 23s  The patient's parents are living, in their mid 70s. The patient has one brother, and one sister. On the paternal side, the patient's father had melanoma diagnosed in his 40s. The paternal grandfather, who died at the age of 36, had a history of lung and colon cancer. There are 2 uncles with colon cancer and one with lung cancer as well as one aunt on the paternal side with breast cancer diagnosed in her late 16s. The patient's only sister underwent lumpectomy at the age of 33, but the patient does not know the exact results.  Review of Systems  Constitutional: Positive for malaise/fatigue.       Wears glasses.  Chronic fatigue   HENT: Negative.   Eyes: Negative.   Respiratory: Negative.   Cardiovascular: Positive for chest pain.       Is worse when she has a breaking out episode.   Gastrointestinal: Negative.   Genitourinary: Negative.   Musculoskeletal: Positive for back pain and falls.       Back and ankle pain. [place] recently. Falls from balance issues.  Skin: Positive for rash. Negative for itching.  Neurological: Positive for tremors.       Hand tremor. Balance issues. Issues focusing (loses train of thought frequently)  Endo/Heme/Allergies: Negative.   Psychiatric/Behavioral: Positive for depression. The patient is nervous/anxious.        Has been having coordination and concentration issues.  All other systems reviewed and are negative. 14 point review of  systems was performed and is negative except as detailed under history of present illness and above    PHYSICAL EXAMINATION  ECOG PERFORMANCE STATUS: 1 - Symptomatic but completely ambulatory  Filed Vitals:   04/26/16 1019  BP: 124/82  Pulse: 84  Temp: 98.7 F (37.1 C)  Resp: 16    Physical Exam  Constitutional: She is oriented to person, place, and time and well-developed, well-nourished, and in no distress.  HENT:  Head: Normocephalic and atraumatic.  Nose: Nose normal.  Mouth/Throat: Oropharynx is clear and moist. No oropharyngeal exudate.  Eyes: Conjunctivae and EOM are normal. Pupils are equal, round, and reactive to light. Right eye exhibits no discharge. Left eye exhibits no discharge. No scleral icterus.  Neck: Normal range of motion. Neck supple. No tracheal deviation present. No thyromegaly present.  Cardiovascular: Normal rate, regular rhythm and normal heart sounds.  Exam reveals no gallop and no friction rub.   No murmur heard. Pulmonary/Chest: Effort normal and breath sounds normal. She has no wheezes. She has no rales.  Abdominal: Soft. Bowel sounds are normal. She exhibits no distension and no mass. There is no tenderness. There is no rebound and no guarding.  Musculoskeletal: Normal range of motion. She exhibits no edema.  Lymphadenopathy:    She has no cervical adenopathy.  Neurological: She is alert and oriented to person, place, and time. She has normal reflexes. No cranial nerve deficit. Gait normal. Coordination normal.  Skin: Skin is warm and dry. No rash noted.  Psychiatric:  Anxiety, depression  Nursing note and vitals reviewed.   LABORATORY DATA: I have reviewed the data as listed.  CBC    Component Value Date/Time   WBC 5.1 02/29/2016 0652   WBC 8.1 08/31/2015 1217   RBC 4.59 02/29/2016 0652   RBC 4.78 08/31/2015 1217   HGB 14.1 02/29/2016 0652   HGB 15.5 08/31/2015 1217   HCT 41.0 02/29/2016 0652   HCT 45.4 08/31/2015 1217   PLT 205  02/29/2016 0652   PLT 253  08/31/2015 1217   MCV 89.3 02/29/2016 0652   MCV 95.0 08/31/2015 1217   MCH 30.7 02/29/2016 0652   MCH 32.5 08/31/2015 1217   MCHC 34.4 02/29/2016 0652   MCHC 34.2 08/31/2015 1217   RDW 12.5 02/29/2016 0652   RDW 13.5 08/31/2015 1217   LYMPHSABS 1.4 01/26/2016 1021   LYMPHSABS 2.3 08/31/2015 1217   MONOABS 0.6 01/26/2016 1021   MONOABS 0.5 08/31/2015 1217   EOSABS 0.2 01/26/2016 1021   EOSABS 0.1 08/31/2015 1217   BASOSABS 0.0 01/26/2016 1021   BASOSABS 0.0 08/31/2015 1217   CMP     Component Value Date/Time   NA 139 01/26/2016 1021   NA 138 08/31/2015 1217   K 3.6 01/26/2016 1021   K 2.6* 08/31/2015 1217   CL 107 01/26/2016 1021   CO2 23 01/26/2016 1021   CO2 27 08/31/2015 1217   GLUCOSE 94 01/26/2016 1021   GLUCOSE 114 08/31/2015 1217   BUN 20 01/26/2016 1021   BUN 10.3 08/31/2015 1217   CREATININE 0.83 01/26/2016 1021   CREATININE 0.8 08/31/2015 1217   CALCIUM 8.8* 01/26/2016 1021   CALCIUM 9.7 08/31/2015 1217   PROT 7.3 01/26/2016 1021   PROT 7.8 08/31/2015 1217   ALBUMIN 3.9 01/26/2016 1021   ALBUMIN 3.9 08/31/2015 1217   AST 21 01/26/2016 1021   AST 24 08/31/2015 1217   ALT 21 01/26/2016 1021   ALT 37 08/31/2015 1217   ALKPHOS 55 01/26/2016 1021   ALKPHOS 70 08/31/2015 1217   BILITOT 0.4 01/26/2016 1021   BILITOT 0.64 08/31/2015 1217   GFRNONAA >60 01/26/2016 1021   GFRAA >60 01/26/2016 1021             PATHOLOGY:  Bone Marrow Biopsy      Flow Cytometry   Lumpectomy     ASSESSMENT and THERAPY PLAN:  High Grade DCIS R breast ER- PR- Negative Sentinel LN Right breast seed localized partial mastectomy with right axillary sentinel lymph node mapping Dr. Brantley Stage 10/13/2015 Adjuvant XRT Anxiety about health MGUS, light chain  I discussed MGUS with the patient. We reviewed her bone marrow pathology. I would just follow her labs every 6 months or so. In regards to her breast cancer diagnosis. 6 month  interval follow-up will also be appropriate.   She continues to have a lot of anxiety about her health. I have encouraged her to continue to work, will address working with her counselor more and/or support groups moving forward.  I will call her in a prescription for Zofran. She uses the [facility] in [place].  She will go see Dr. Gavin Pound for rheumatology consult given her ongoing joint pain complaints.   Orders Placed This Encounter  Procedures  . CBC with Differential    Standing Status:   Future    Standing Expiration Date:   10/28/2017  . Multiple Myeloma Panel (SPEP&IFE w/QIG)    Standing Status:   Future    Standing Expiration Date:   10/28/2017    All questions were answered. The patient knows to call the clinic with any problems, questions or concerns. We can certainly see the patient much sooner if necessary.  This document serves as a record of services personally performed by Ancil Linsey, MD. It was created on her behalf by Toni Amend, a trained medical scribe. The creation of this record is based on the scribe's personal observations and the provider's statements to them. This document has been checked and approved by the attending provider.  I have reviewed the above documentation for accuracy and completeness, and I agree with the above.  This note was electronically signed. Molli Hazard, MD  04/26/2016

## 2016-04-26 NOTE — Progress Notes (Signed)
Referral sent to [facility].  Records faxed on 7/6.  They will contact patient with appt.

## 2016-04-26 NOTE — Patient Instructions (Signed)
[place] at [facility] Discharge Instructions  RECOMMENDATIONS MADE BY THE CONSULTANT AND ANY TEST RESULTS WILL BE SENT TO YOUR REFERRING PHYSICIAN.  You saw Dr. Whitney Muse today Return to Clinic in 6 months with CBC diff and myloma labs. Please call the clinic with any concerns.  Thank you for choosing [place] at [facility] to provide your oncology and hematology care.  To afford each patient quality time with our provider, please arrive at least 15 minutes before your scheduled appointment time.   Beginning January 23rd 2017 lab work for the [facility] will be done in the  Main lab at [facility] on 1st floor. If you have a lab appointment with the [facility] please come in thru the  Main Entrance and check in at the main information desk  You need to re-schedule your appointment should you arrive 10 or more minutes late.  We strive to give you quality time with our providers, and arriving late affects you and other patients whose appointments are after yours.  Also, if you no show three or more times for appointments you may be dismissed from the clinic at the providers discretion.     Again, thank you for choosing [facility].  Our hope is that these requests will decrease the amount of time that you wait before being seen by our physicians.       _____________________________________________________________  Should you have questions after your visit to [facility], please contact our office at [phone] between the hours of 8:30 a.m. and 4:30 p.m.  Voicemails left after 4:30 p.m. will not be returned until the following business day.  For prescription refill requests, have your pharmacy contact our office.         Resources For Cancer Patients and their Caregivers ? American Cancer Society: Can assist with transportation, wigs, general needs, runs Look Good Feel Better.         308-345-3123 ? Cancer Care: Provides financial assistance, online support groups, medication/co-pay assistance.  1-800-813-HOPE 6143326925) ? [place] Assists [place] cancer patients and their families through emotional , educational and financial support.  909-023-8258 ? [place] DSS Where to apply for food stamps, Medicaid and utility assistance. 509-179-7295 ? [facility]: Transportation to medical appointments. (206)785-7743 ? Social Security Administration: May apply for disability if have a Stage IV cancer. 989-469-4046 7014917295 ? [employer], Disability and Transit Services: Assists with nutrition, care and transit needs. [place] Support Programs: @10RELATIVEDAYS @ > Cancer Support Group  2nd Tuesday of the month 1pm-2pm, Journey Room  > Creative Journey  3rd Tuesday of the month 1130am-1pm, Journey Room  > Look Good Feel Better  1st Wednesday of the month 10am-12 noon, Journey Room (Call [place] to register [phone])

## 2016-04-30 DIAGNOSIS — R42 Dizziness and giddiness: Secondary | ICD-10-CM | POA: Diagnosis not present

## 2016-04-30 DIAGNOSIS — H9313 Tinnitus, bilateral: Secondary | ICD-10-CM | POA: Diagnosis not present

## 2016-04-30 DIAGNOSIS — H903 Sensorineural hearing loss, bilateral: Secondary | ICD-10-CM | POA: Diagnosis not present

## 2016-05-10 DIAGNOSIS — S62031A Displaced fracture of proximal third of navicular [scaphoid] bone of right wrist, initial encounter for closed fracture: Secondary | ICD-10-CM | POA: Diagnosis not present

## 2016-05-14 DIAGNOSIS — Z1211 Encounter for screening for malignant neoplasm of colon: Secondary | ICD-10-CM | POA: Diagnosis not present

## 2016-05-14 HISTORY — PX: COLONOSCOPY WITH PROPOFOL: SHX5780

## 2016-05-20 ENCOUNTER — Encounter (HOSPITAL_COMMUNITY): Payer: Self-pay | Admitting: Hematology & Oncology

## 2016-05-20 IMAGING — MR MR HEAD WO/W CM
8 of 13 series · 21 of 48 positions shown · IV contrast (multihance)
Comparison: Intracranial MRA 09/21/2015. Head CT without contrast
07/17/2015

CLINICAL DATA: 50-year-old female with new diagnosis of breast
cancer. Syncope in weakness. Subsequent encounter. Small anterior
communicating artery aneurysm on [place] MRA.

EXAM:
MRI HEAD WITHOUT AND WITH CONTRAST
TECHNIQUE: Multiplanar, multiecho pulse sequences of the brain and surrounding
structures were obtained without and with intravenous contrast.
CONTRAST:  20mL MULTIHANCE GADOBENATE DIMEGLUMINE 529 MG/ML IV SOLN

[Series 3: T1 · sagittal · 5.0mm · 0.43mm/px · 2 of 21 slices shown (1 of 2)]
[im 1/21]
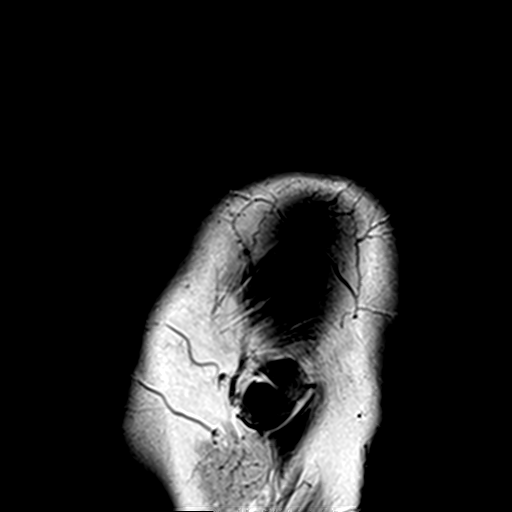
[im 21/21]
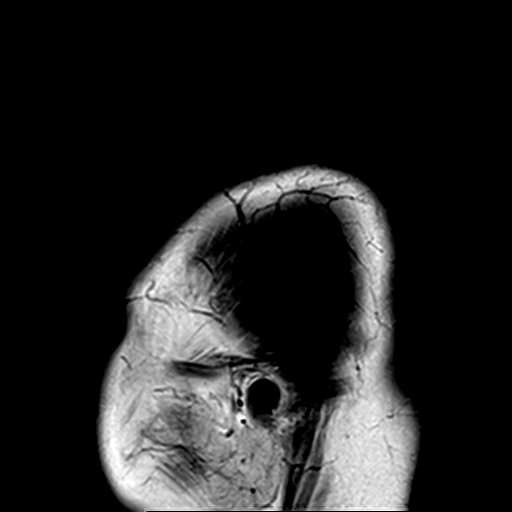

[Series 6: T2 · axial · 5.0mm · 0.51mm/px · z∈[-73,+69]mm · 2 of 23 slices shown (1 of 2)]
[im 1/23]
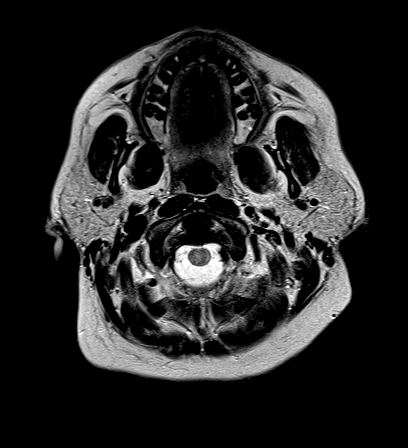
[im 23/23]
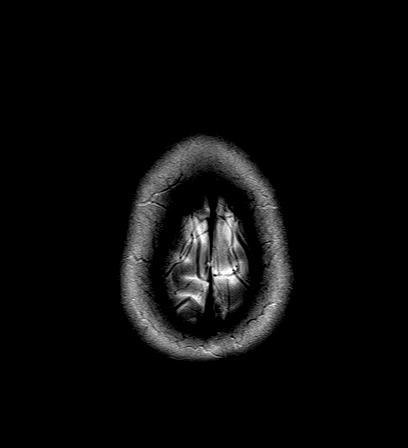

[Series 7: FLAIR · axial · 5.0mm · 0.38mm/px · z∈[-73,+70]mm · 2 of 23 slices shown]
[im 1/23]
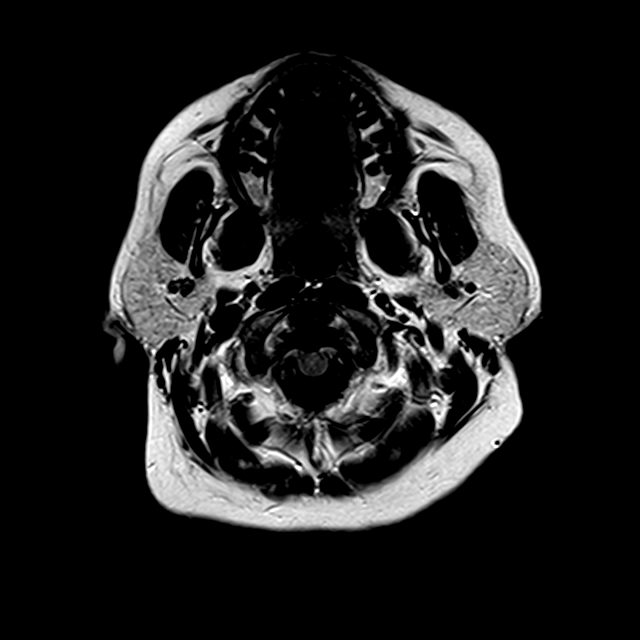
[im 23/23]
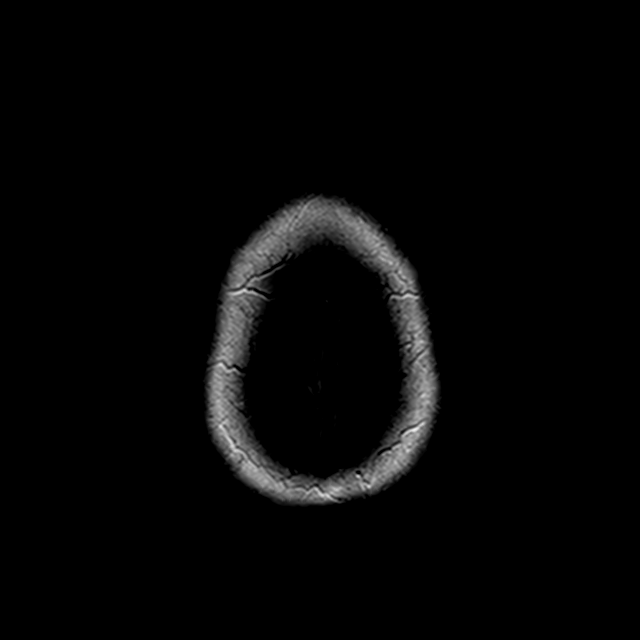

[Series 8: T1 · axial · 2.0mm · 0.45mm/px · 1 of 95 slices shown (2 of 2)]
[im 1/95]
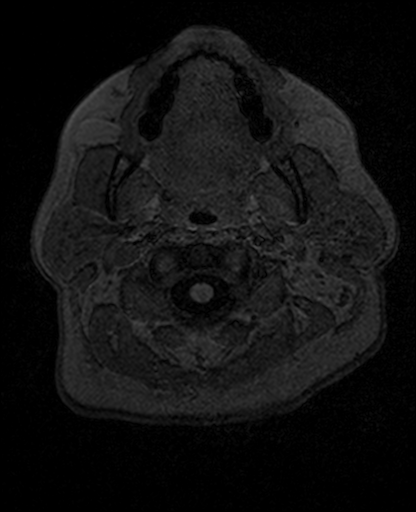

[Series 10: T2 · coronal · 5.0mm · 0.43mm/px · 2 of 24 slices shown (2 of 2)]
[im 1/24]
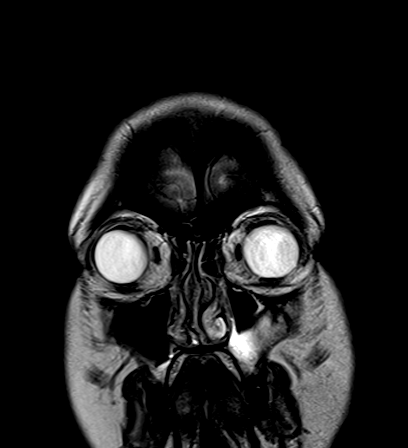
[im 24/24]
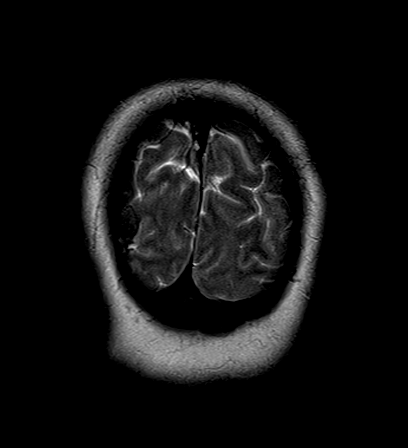

[Series 11: T1 post-contrast · axial · 2.0mm · 0.45mm/px · z∈[-87,+101]mm · 8 of 95 slices shown (1 of 3)]
[im 1/95]
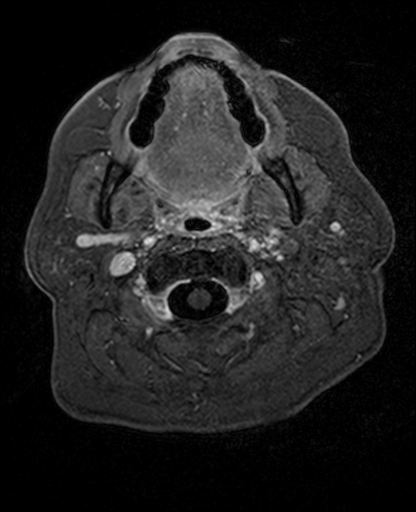
[im 14/95]
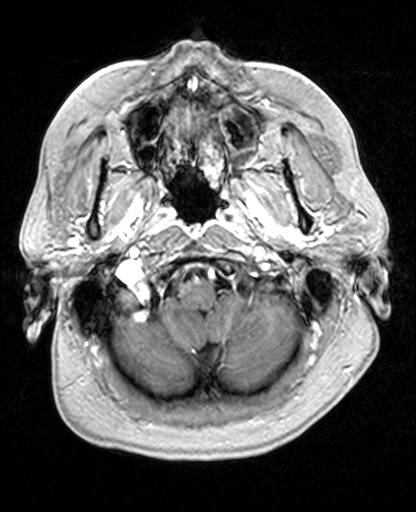
[im 27/95]
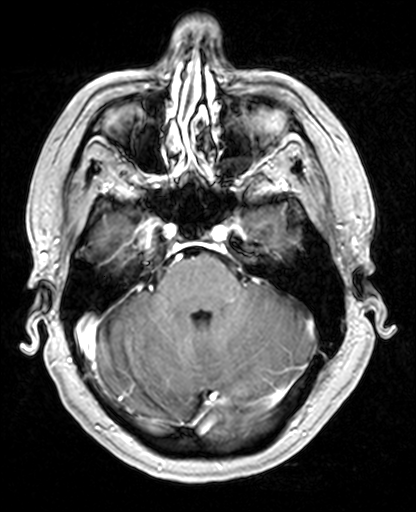
[im 41/95]
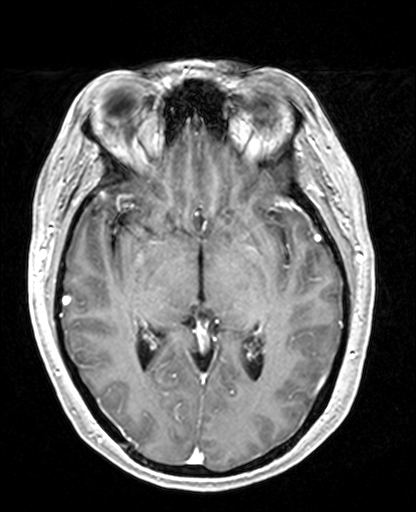
[im 54/95]
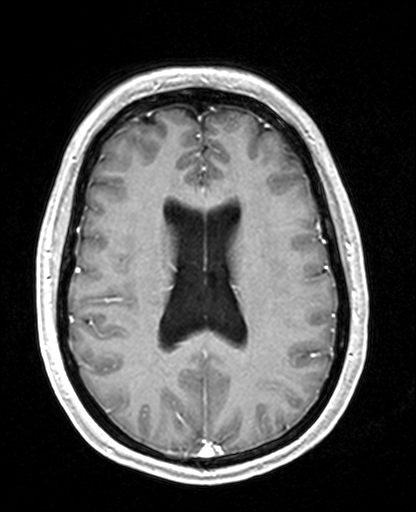
[im 68/95]
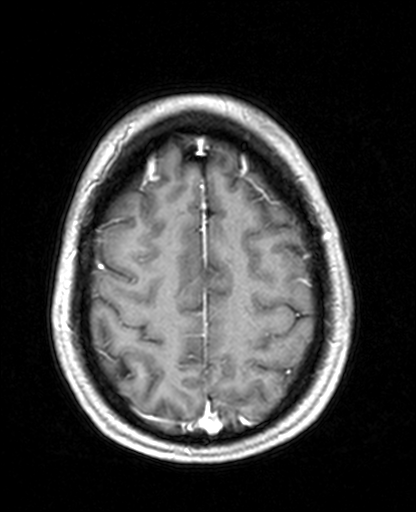
[im 81/95]
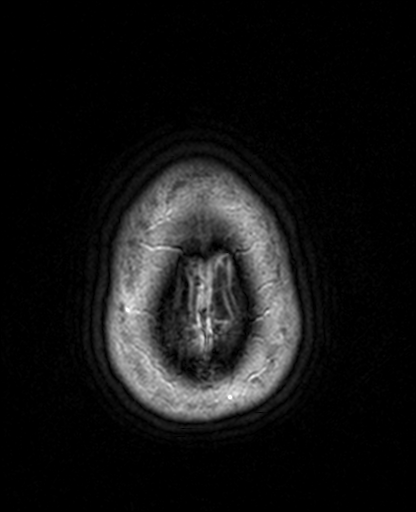
[im 95/95]
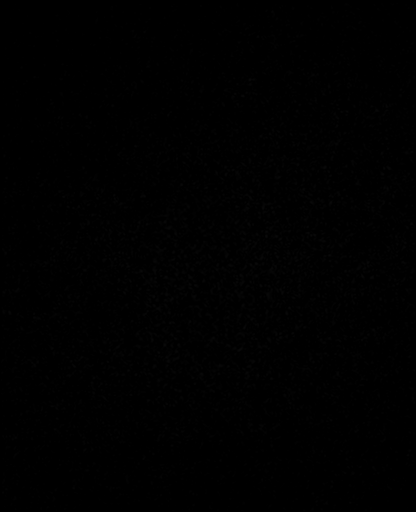

[Series 12: T1 post-contrast · coronal · 5.0mm · 0.40mm/px · 2 of 28 slices shown (2 of 3)]
[im 1/28]
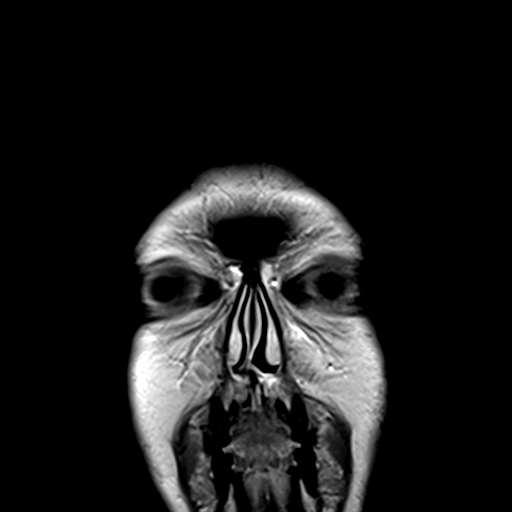
[im 28/28]
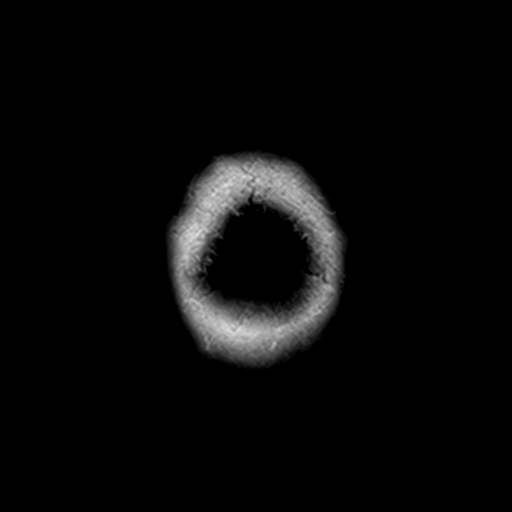

[Series 13: T1 post-contrast · sagittal · 5.0mm · 0.43mm/px · 2 of 21 slices shown (3 of 3)]
[im 1/21]
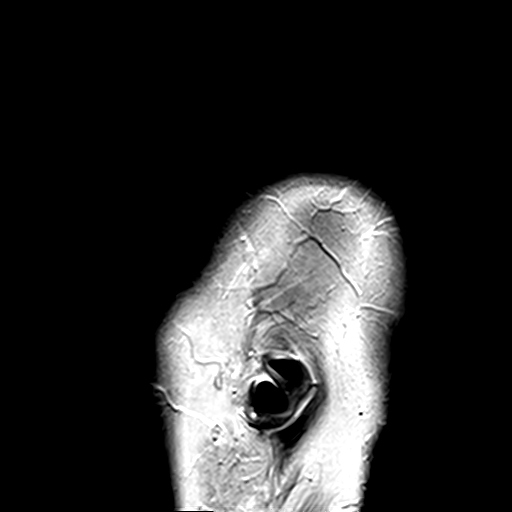
[im 21/21]
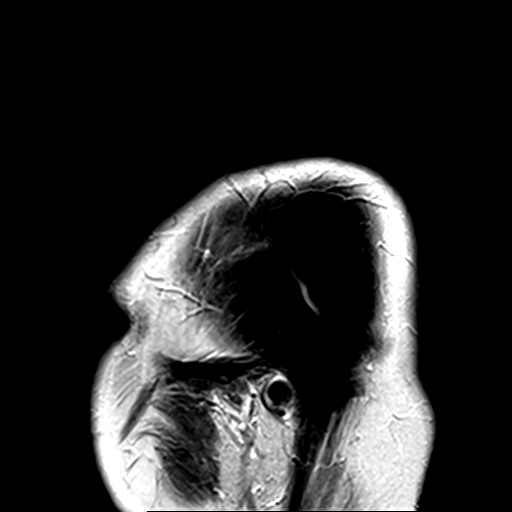

[21 of 48 positions shown; findings below may reference images not displayed]

FINDINGS: No midline shift, mass effect, or evidence of intracranial mass
lesion. No abnormal enhancement identified. Small anterior right
frontal lobe developmental venous anomaly (normal anatomic variant
series 11, image 70). No dural thickening.

Cerebral volume is within normal limits. No restricted diffusion to
suggest acute infarction. No ventriculomegaly, extra-axial
collection or acute intracranial hemorrhage. Cervicomedullary
junction and pituitary are within normal limits. Negative visualized
cervical spine. Gray and white matter signal is within normal limits
for age throughout the brain. Major intracranial vascular flow voids
are preserved.

Visible internal auditory structures appear normal. Mastoids are
clear. Mild mostly left maxillary sinus paranasal sinus mucosal
thickening. Negative orbit and scalp soft tissues. Visualized bone
marrow signal is within normal limits.
IMPRESSION: 1.  No acute or metastatic intracranial abnormality.
2. Tiny anterior communicating artery aneurysm described in [REDACTED]
is only evident by MRA.

## 2016-05-30 DIAGNOSIS — M797 Fibromyalgia: Secondary | ICD-10-CM | POA: Diagnosis not present

## 2016-05-30 DIAGNOSIS — G4733 Obstructive sleep apnea (adult) (pediatric): Secondary | ICD-10-CM | POA: Diagnosis not present

## 2016-05-30 DIAGNOSIS — M2550 Pain in unspecified joint: Secondary | ICD-10-CM | POA: Diagnosis not present

## 2016-05-30 DIAGNOSIS — M150 Primary generalized (osteo)arthritis: Secondary | ICD-10-CM | POA: Diagnosis not present

## 2016-05-30 DIAGNOSIS — R5382 Chronic fatigue, unspecified: Secondary | ICD-10-CM | POA: Diagnosis not present

## 2016-06-11 DIAGNOSIS — S62031A Displaced fracture of proximal third of navicular [scaphoid] bone of right wrist, initial encounter for closed fracture: Secondary | ICD-10-CM | POA: Insufficient documentation

## 2016-06-13 ENCOUNTER — Encounter (HOSPITAL_COMMUNITY): Payer: Self-pay

## 2016-06-15 DIAGNOSIS — M24131 Other articular cartilage disorders, right wrist: Secondary | ICD-10-CM | POA: Diagnosis not present

## 2016-06-15 DIAGNOSIS — G8918 Other acute postprocedural pain: Secondary | ICD-10-CM | POA: Diagnosis not present

## 2016-06-15 DIAGNOSIS — M19131 Post-traumatic osteoarthritis, right wrist: Secondary | ICD-10-CM | POA: Diagnosis not present

## 2016-06-19 DIAGNOSIS — S62031D Displaced fracture of proximal third of navicular [scaphoid] bone of right wrist, subsequent encounter for fracture with routine healing: Secondary | ICD-10-CM | POA: Diagnosis not present

## 2016-06-20 DIAGNOSIS — M797 Fibromyalgia: Secondary | ICD-10-CM | POA: Diagnosis not present

## 2016-06-20 DIAGNOSIS — M2550 Pain in unspecified joint: Secondary | ICD-10-CM | POA: Diagnosis not present

## 2016-06-20 DIAGNOSIS — R5382 Chronic fatigue, unspecified: Secondary | ICD-10-CM | POA: Diagnosis not present

## 2016-06-20 DIAGNOSIS — M150 Primary generalized (osteo)arthritis: Secondary | ICD-10-CM | POA: Diagnosis not present

## 2016-06-30 DIAGNOSIS — G4733 Obstructive sleep apnea (adult) (pediatric): Secondary | ICD-10-CM | POA: Diagnosis not present

## 2016-07-19 ENCOUNTER — Emergency Department (HOSPITAL_COMMUNITY): Payer: BC Managed Care – PPO

## 2016-07-19 ENCOUNTER — Encounter (HOSPITAL_COMMUNITY): Payer: Self-pay | Admitting: *Deleted

## 2016-07-19 ENCOUNTER — Emergency Department (HOSPITAL_COMMUNITY)
Admission: EM | Admit: 2016-07-19 | Discharge: 2016-07-19 | Disposition: A | Discharge status: Home / Self Care | Payer: BC Managed Care – PPO | Attending: Emergency Medicine | Admitting: Emergency Medicine

## 2016-07-19 DIAGNOSIS — Z79899 Other long term (current) drug therapy: Secondary | ICD-10-CM | POA: Diagnosis not present

## 2016-07-19 DIAGNOSIS — Z7982 Long term (current) use of aspirin: Secondary | ICD-10-CM | POA: Diagnosis not present

## 2016-07-19 DIAGNOSIS — R109 Unspecified abdominal pain: Secondary | ICD-10-CM | POA: Diagnosis not present

## 2016-07-19 DIAGNOSIS — R1011 Right upper quadrant pain: Secondary | ICD-10-CM

## 2016-07-19 DIAGNOSIS — E119 Type 2 diabetes mellitus without complications: Secondary | ICD-10-CM | POA: Insufficient documentation

## 2016-07-19 DIAGNOSIS — I10 Essential (primary) hypertension: Secondary | ICD-10-CM | POA: Insufficient documentation

## 2016-07-19 DIAGNOSIS — R079 Chest pain, unspecified: Secondary | ICD-10-CM | POA: Diagnosis not present

## 2016-07-19 DIAGNOSIS — Z794 Long term (current) use of insulin: Secondary | ICD-10-CM | POA: Insufficient documentation

## 2016-07-19 LAB — URINALYSIS, ROUTINE W REFLEX MICROSCOPIC
Bilirubin Urine: NEGATIVE
GLUCOSE, UA: NEGATIVE mg/dL
Hgb urine dipstick: NEGATIVE
KETONES UR: NEGATIVE mg/dL
LEUKOCYTES UA: NEGATIVE
NITRITE: NEGATIVE
PROTEIN: NEGATIVE mg/dL
Specific Gravity, Urine: 1.01 (ref 1.005–1.030)
pH: 5.5 (ref 5.0–8.0)

## 2016-07-19 LAB — CBC WITH DIFFERENTIAL/PLATELET
BASOS ABS: 0 10*3/uL (ref 0.0–0.1)
BASOS PCT: 0 %
EOS PCT: 3 %
Eosinophils Absolute: 0.2 10*3/uL (ref 0.0–0.7)
HCT: 39 % (ref 36.0–46.0)
Hemoglobin: 13.5 g/dL (ref 12.0–15.0)
Lymphocytes Relative: 30 %
Lymphs Abs: 1.8 10*3/uL (ref 0.7–4.0)
MCH: 31.2 pg (ref 26.0–34.0)
MCHC: 34.6 g/dL (ref 30.0–36.0)
MCV: 90.1 fL (ref 78.0–100.0)
MONO ABS: 0.4 10*3/uL (ref 0.1–1.0)
Monocytes Relative: 7 %
Neutro Abs: 3.6 10*3/uL (ref 1.7–7.7)
Neutrophils Relative %: 60 %
PLATELETS: 191 10*3/uL (ref 150–400)
RBC: 4.33 MIL/uL (ref 3.87–5.11)
RDW: 12.2 % (ref 11.5–15.5)
WBC: 6.1 10*3/uL (ref 4.0–10.5)

## 2016-07-19 LAB — COMPREHENSIVE METABOLIC PANEL
ALBUMIN: 4.1 g/dL (ref 3.5–5.0)
ALK PHOS: 54 U/L (ref 38–126)
ALT: 26 U/L (ref 14–54)
AST: 21 U/L (ref 15–41)
Anion gap: 9 (ref 5–15)
BILIRUBIN TOTAL: 0.8 mg/dL (ref 0.3–1.2)
BUN: 10 mg/dL (ref 6–20)
CALCIUM: 8.9 mg/dL (ref 8.9–10.3)
CO2: 24 mmol/L (ref 22–32)
CREATININE: 0.88 mg/dL (ref 0.44–1.00)
Chloride: 105 mmol/L (ref 101–111)
GFR calc Af Amer: >60 mL/min (ref >60)
GFR calc non Af Amer: >60 mL/min (ref >60)
GLUCOSE: 89 mg/dL (ref 65–99)
Potassium: 3.2 mmol/L — ABNORMAL LOW (ref 3.5–5.1)
Sodium: 138 mmol/L (ref 135–145)
TOTAL PROTEIN: 7.3 g/dL (ref 6.5–8.1)

## 2016-07-19 LAB — LIPASE, BLOOD: Lipase: 22 U/L (ref 11–51)

## 2016-07-19 MED ORDER — SODIUM CHLORIDE 0.9 % IV BOLUS (SEPSIS)
500.0000 mL | Freq: Once | INTRAVENOUS | Status: AC
  Administered 2016-07-19: 500 mL via INTRAVENOUS

## 2016-07-19 MED ORDER — ONDANSETRON HCL 4 MG/2ML IJ SOLN
4.0000 mg | Freq: Once | INTRAMUSCULAR | Status: AC
  Administered 2016-07-19: 4 mg via INTRAVENOUS
  Filled 2016-07-19: qty 2

## 2016-07-19 NOTE — ED Notes (Signed)
Pt alert & oriented x4, stable gait. Patient given discharge instructions, paperwork & prescription(s). Patient  instructed to stop at the registration desk to finish any additional paperwork. Patient verbalized understanding. Pt left department w/ no further questions. 

## 2016-07-19 NOTE — ED Provider Notes (Signed)
[place] DEPT Provider Note   CSN: [id number] Arrival date & time: 07/19/16  1548     History   Chief Complaint Chief Complaint  Patient presents with  . Abdominal Pain    HPI Sarah Cooley is a 50 y.o. female.  She presents for evaluation of right upper quadrant pain radiating to the right mid abdomen, and right flank. Pain waxes and wanes but present for several days. She denies hematuria, dysuria, fever, chills, nausea or vomiting. She is status post cholecystectomy. She has "fibromyalgia." She is taking usual medications, without relief. She was able to work until midday today. There are no other no modifying factors.  HPI  Past Medical History:  Diagnosis Date  . Anxiety   . Arthritis   . Balance problem   . Brain aneurysm    2 mm ACA region aneurysm by 09/21/15 MRA  . Breast cancer ([place])   . Complication of anesthesia    heart rate drops and O2 sats drop  . Constipation   . Depression   . Diabetes mellitus without complication ([place])    not on medication at this time  . Elevated liver function tests   . Family history of adverse reaction to anesthesia    mom has n/v  . Family history of breast cancer   . Family history of colon cancer   . Fibromyalgia   . Fibromyalgia   . GERD (gastroesophageal reflux disease)    none recent  . H/O acute renal failure 09/22/15   admitted to [facility]  . Headache    occipital and temporal  . History of hiatal hernia   . Hyperlipemia   . Hyperlipidemia   . Hypertension   . Irritable bowel syndrome (IBS)   . Occasional tremors   . OSA (obstructive sleep apnea)    uses cpap with humidification  . PONV (postoperative nausea and vomiting)     Patient Active Problem List   Diagnosis Date Noted  . MGUS (monoclonal gammopathy of unknown significance) 03/15/2016  . Genetic testing 09/08/2015  . Family history of breast cancer   . Family history of colon cancer   . Breast cancer of lower-inner quadrant of  right female breast ([place]) 08/26/2015  . Pain in the chest   . Cephalalgia   . Headache 07/17/2015  . Elevated troponin 07/17/2015  . Hypertension 07/17/2015  . Obesity 07/17/2015    Past Surgical History:  Procedure Laterality Date  . ABDOMINAL HYSTERECTOMY    . BLADDER SURGERY    . BONE GRAFT HIP ILIAC CREST    . CHOLECYSTECTOMY    . FRACTURE SURGERY Right    wrist  . KNEE ARTHROSCOPY W/ ACL RECONSTRUCTION     right  . RADIOACTIVE SEED GUIDED MASTECTOMY WITH AXILLARY SENTINEL LYMPH NODE BIOPSY Right 10/13/2015   Procedure: RADIOACTIVE SEED GUIDED PARTIAL MASTECTOMY WITH AXILLARY SENTINEL LYMPH NODE BIOPSY;  Surgeon: Erroll Luna, MD;  Location: [facility];  Service: General;  Laterality: Right;  . WRIST SURGERY      OB History    No data available       Home Medications    Prior to Admission medications   Medication Sig Start Date End Date Taking? Authorizing Provider  amLODipine (NORVASC) 5 MG tablet Take 5 mg by mouth daily. Reported on 03/15/2016 12/07/15  Yes Historical Provider, MD  aspirin EC 81 MG tablet Take 81 mg by mouth daily.   Yes Historical Provider, MD  clonazePAM (KLONOPIN) 1 MG tablet Take 1  mg by mouth See admin instructions. Take 1 tablet (1 mg) by mouth every morning and at night, may take an additional tablet mid-day if needed for anxiety   Yes Historical Provider, MD  ibuprofen (ADVIL,MOTRIN) 200 MG tablet Take 800 mg by mouth every 6 (six) hours as needed for moderate pain.   Yes Historical Provider, MD  oxyCODONE-acetaminophen (ROXICET) 5-325 MG tablet Take 1-2 tablets by mouth every 4 (four) hours as needed. Patient taking differently: Take 1-2 tablets by mouth every 4 (four) hours as needed for moderate pain or severe pain.  10/13/15  Yes Erroll Luna, MD  polyethylene glycol (MIRALAX / GLYCOLAX) packet Take 17 g by mouth daily as needed.   Yes Historical Provider, MD  traZODone (DESYREL) 50 MG tablet Take 100 mg by mouth at bedtime.  09/06/15  Yes  Historical Provider, MD  triamcinolone (NASACORT ALLERGY 24HR) 55 MCG/ACT AERO nasal inhaler Place 2 sprays into both nostrils daily as needed (Allergies).    Yes Historical Provider, MD  Vitamin D, Ergocalciferol, (DRISDOL) 50000 UNITS CAPS capsule Take 50,000 Units by mouth 2 (two) times a week. Monday and Thursday   Yes Historical Provider, MD  vortioxetine HBr (TRINTELLIX) 10 MG TABS Take 10 mg by mouth daily.    Yes Historical Provider, MD    Family History Family History  Problem Relation Age of Onset  . Hypertension Mother   . Autoimmune disease Mother   . Eczema Mother   . Heart disease Maternal Aunt   . Lung cancer Paternal Aunt   . Breast cancer Paternal Aunt     dx in her 11s  . Lung cancer Paternal Grandfather   . Colon cancer Paternal Uncle     dx in her 74s  . Colon cancer Paternal Uncle     dx in his 81s  . Melanoma Father 61  . Psoriasis Father   . Hypertension Father   . Heart disease Father   . Breast cancer Sister     dx in her 35s  . Lupus Sister   . COPD Maternal Uncle   . Colon cancer Paternal Uncle     dx in his 57s    Social History Social History  Substance Use Topics  . Smoking status: Never Smoker  . Smokeless tobacco: Never Used  . Alcohol use 0.0 oz/week     Comment: 2-3 times a year     Allergies   Shrimp [shellfish allergy]; Bactrim [sulfamethoxazole-trimethoprim]; Erythromycin; Minocycline hcl; Ramipril; Adhesive [tape]; Drixoral [brompheniramine-pseudoeph]; Lyrica [pregabalin]; and Sinutab [chlorphen-pseudoephed-apap]   Review of Systems Review of Systems  All other systems reviewed and are negative.    Physical Exam Updated Vital Signs BP 122/74 (BP Location: Left Arm)   Pulse (!) 58   Temp 98.2 F (36.8 C) (Oral)   Resp 16   Ht 5\' 8"  (1.727 m)   Wt 216 lb (98 kg)   SpO2 94%   BMI 32.84 kg/m   Physical Exam  Constitutional: She is oriented to person, place, and time. She appears well-developed and well-nourished.    HENT:  Head: Normocephalic and atraumatic.  Eyes: Conjunctivae and EOM are normal. Pupils are equal, round, and reactive to light.  Neck: Normal range of motion and phonation normal. Neck supple.  Cardiovascular: Normal rate and regular rhythm.   Pulmonary/Chest: Effort normal and breath sounds normal. She exhibits no tenderness.  Abdominal: Soft. She exhibits no distension. There is no tenderness (Right upper quadrant and right midabdomen, mild). There is no  guarding.  Musculoskeletal: Normal range of motion.  Neurological: She is alert and oriented to person, place, and time. She exhibits normal muscle tone.  Skin: Skin is warm and dry.  Psychiatric: She has a normal mood and affect. Her behavior is normal. Judgment and thought content normal.  Nursing note and vitals reviewed.    ED Treatments / Results  Labs (all labs ordered are listed, but only abnormal results are displayed) Labs Reviewed  COMPREHENSIVE METABOLIC PANEL - Abnormal; Notable for the following:       Result Value   Potassium 3.2 (*)    All other components within normal limits  CBC WITH DIFFERENTIAL/PLATELET  URINALYSIS, ROUTINE W REFLEX MICROSCOPIC (NOT AT [facility])  LIPASE, BLOOD    EKG  EKG Interpretation None       Radiology Dg Chest 2 View  Result Date: 07/19/2016 CLINICAL DATA:  Right-sided chest pain. EXAM: CHEST  2 VIEW COMPARISON:  None. FINDINGS: The heart size and mediastinal contours are within normal limits. Both lungs are clear. The visualized skeletal structures are unremarkable. IMPRESSION: No active cardiopulmonary disease. Electronically Signed   By: Fidela Salisbury M.D.   On: 07/19/2016 17:21   Ct Renal Stone Study  Result Date: 07/19/2016 CLINICAL DATA:  50 year old female with right flank pain. EXAM: CT ABDOMEN AND PELVIS WITHOUT CONTRAST TECHNIQUE: Multidetector CT imaging of the abdomen and pelvis was performed following the standard protocol without IV contrast. COMPARISON:   Abdominal CT dated 09/27/2015 FINDINGS: Evaluation of this exam is limited in the absence of intravenous contrast. Lower chest: The visualized lung bases are clear. No intra-abdominal free air or free fluid. Hepatobiliary: Cholecystectomy. Mild diffuse fatty infiltration of the liver. No intrahepatic biliary ductal dilatation. Pancreas: Unremarkable. No pancreatic ductal dilatation or surrounding inflammatory changes. Spleen: Normal in size without focal abnormality. Adrenals/Urinary Tract: A 6 mm left adrenal indeterminate nodule, likely an adenoma. The right adrenal gland appears unremarkable. There is a stable appearing 1.6 cm exophytic hypodense lesion arising from the posterior cortex of the right kidney with fluid attenuation and most compatible with a cyst. Nonemergent ultrasound is recommended for better evaluation. The kidneys are otherwise unremarkable. There is no hydronephrosis or nephrolithiasis on either side. The visualized ureters and urinary bladder appear unremarkable. Stomach/Bowel: There is moderate stool throughout the colon. No evidence of bowel obstruction or active inflammation. Normal appendix. Vascular/Lymphatic: The abdominal aorta and IVC are grossly unremarkable on this noncontrast study. No portal venous gas identified. There is no adenopathy. Reproductive: Hysterectomy. Other: A 2 cm nodular density versus glandular tissue in the right breast with adjacent biopsy clips. Musculoskeletal: No acute or significant osseous findings. IMPRESSION: No acute intra-abdominal or pelvic pathology. No hydronephrosis or nephrolithiasis. Electronically Signed   By: Anner Crete M.D.   On: 07/19/2016 21:22    Procedures Procedures (including critical care time)  Medications Ordered in ED Medications  sodium chloride 0.9 % bolus 500 mL (0 mLs Intravenous Stopped 07/19/16 1812)  ondansetron (ZOFRAN) injection 4 mg (4 mg Intravenous Given 07/19/16 1715)     Initial Impression / Assessment  and Plan / ED Course  I have reviewed the triage vital signs and the nursing notes.  Pertinent labs & imaging results that were available during my care of the patient were reviewed by me and considered in my medical decision making (see chart for details).  Clinical Course    Medications  sodium chloride 0.9 % bolus 500 mL (0 mLs Intravenous Stopped 07/19/16 1812)  ondansetron (ZOFRAN) injection  4 mg (4 mg Intravenous Given 07/19/16 1715)    Patient Vitals for the past 24 hrs:  BP Temp Temp src Pulse Resp SpO2 Height Weight  07/19/16 2045 122/74 98.2 F (36.8 C) Oral (!) 58 16 94 % - -  07/19/16 1907 119/66 97.1 F (36.2 C) Oral (!) 53 16 95 % - -  07/19/16 1856 123/74 - - 65 - 96 % - -  07/19/16 1840 123/74 - - 64 18 96 % - -  07/19/16 1555 151/90 98.6 F (37 C) Oral 88 18 100 % 5\' 8"  (1.727 m) 216 lb (98 kg)    At discharge- Reevaluation with update and discussion. After initial assessment and treatment, an updated evaluation reveals no further complaints. Findings discussed with the patient and all questions were answered. Sarah Cooley L     Final Clinical Impressions(s) / ED Diagnoses   Final diagnoses:  Right upper quadrant pain   Nursing Notes Reviewed/ Care Coordinated Applicable Imaging Reviewed Interpretation of Laboratory Data incorporated into ED treatment  The patient appears reasonably screened and/or stabilized for discharge and I doubt any other medical condition or other [facility] requiring further screening, evaluation, or treatment in the ED at this time prior to discharge.  Plan: Home Medications- continue; Home Treatments- rest; return here if the recommended treatment, does not improve the symptoms; Recommended follow up- PCP prn    New Prescriptions Discharge Medication List as of 07/19/2016  9:39 PM       Daleen Bo, MD 07/19/16 2201

## 2016-07-19 NOTE — ED Triage Notes (Signed)
Pain in epigastric region, right chest and flank pain

## 2016-07-19 NOTE — ED Notes (Signed)
Pt states she is unable to tolerate gingerale at this time.  States it made her very nauseated.

## 2016-07-30 DIAGNOSIS — G4733 Obstructive sleep apnea (adult) (pediatric): Secondary | ICD-10-CM | POA: Diagnosis not present

## 2016-07-31 DIAGNOSIS — I10 Essential (primary) hypertension: Secondary | ICD-10-CM | POA: Diagnosis not present

## 2016-07-31 DIAGNOSIS — Z6833 Body mass index (BMI) 33.0-33.9, adult: Secondary | ICD-10-CM | POA: Diagnosis not present

## 2016-07-31 DIAGNOSIS — F329 Major depressive disorder, single episode, unspecified: Secondary | ICD-10-CM | POA: Diagnosis not present

## 2016-07-31 DIAGNOSIS — Z299 Encounter for prophylactic measures, unspecified: Secondary | ICD-10-CM | POA: Diagnosis not present

## 2016-07-31 DIAGNOSIS — F419 Anxiety disorder, unspecified: Secondary | ICD-10-CM | POA: Diagnosis not present

## 2016-08-01 ENCOUNTER — Other Ambulatory Visit (HOSPITAL_COMMUNITY): Payer: Self-pay | Admitting: Nurse Practitioner

## 2016-08-01 DIAGNOSIS — E279 Disorder of adrenal gland, unspecified: Principal | ICD-10-CM

## 2016-08-01 DIAGNOSIS — E278 Other specified disorders of adrenal gland: Secondary | ICD-10-CM

## 2016-08-01 DIAGNOSIS — R229 Localized swelling, mass and lump, unspecified: Principal | ICD-10-CM

## 2016-08-01 DIAGNOSIS — IMO0002 Reserved for concepts with insufficient information to code with codable children: Secondary | ICD-10-CM

## 2016-08-02 ENCOUNTER — Other Ambulatory Visit (HOSPITAL_COMMUNITY): Payer: Self-pay | Admitting: Nurse Practitioner

## 2016-08-02 DIAGNOSIS — IMO0002 Reserved for concepts with insufficient information to code with codable children: Secondary | ICD-10-CM

## 2016-08-02 DIAGNOSIS — Z9889 Other specified postprocedural states: Secondary | ICD-10-CM

## 2016-08-02 DIAGNOSIS — S62031A Displaced fracture of proximal third of navicular [scaphoid] bone of right wrist, initial encounter for closed fracture: Secondary | ICD-10-CM | POA: Diagnosis not present

## 2016-08-02 DIAGNOSIS — S62031D Displaced fracture of proximal third of navicular [scaphoid] bone of right wrist, subsequent encounter for fracture with routine healing: Secondary | ICD-10-CM | POA: Diagnosis not present

## 2016-08-02 DIAGNOSIS — R229 Localized swelling, mass and lump, unspecified: Principal | ICD-10-CM

## 2016-08-07 ENCOUNTER — Ambulatory Visit (HOSPITAL_COMMUNITY)
Admission: RE | Admit: 2016-08-07 | Discharge: 2016-08-07 | Disposition: A | Discharge status: Home / Self Care | Payer: BC Managed Care – PPO | Source: Ambulatory Visit | Attending: Nurse Practitioner | Admitting: Nurse Practitioner

## 2016-08-07 ENCOUNTER — Ambulatory Visit (HOSPITAL_COMMUNITY): Payer: BC Managed Care – PPO

## 2016-08-07 ENCOUNTER — Other Ambulatory Visit (HOSPITAL_COMMUNITY): Payer: Self-pay | Admitting: Nurse Practitioner

## 2016-08-07 DIAGNOSIS — M179 Osteoarthritis of knee, unspecified: Secondary | ICD-10-CM | POA: Diagnosis not present

## 2016-08-07 DIAGNOSIS — N281 Cyst of kidney, acquired: Secondary | ICD-10-CM | POA: Diagnosis not present

## 2016-08-07 DIAGNOSIS — N63 Unspecified lump in unspecified breast: Secondary | ICD-10-CM | POA: Insufficient documentation

## 2016-08-07 DIAGNOSIS — K769 Liver disease, unspecified: Secondary | ICD-10-CM | POA: Insufficient documentation

## 2016-08-07 DIAGNOSIS — R7989 Other specified abnormal findings of blood chemistry: Secondary | ICD-10-CM | POA: Diagnosis not present

## 2016-08-07 DIAGNOSIS — M25461 Effusion, right knee: Secondary | ICD-10-CM | POA: Insufficient documentation

## 2016-08-07 DIAGNOSIS — M1711 Unilateral primary osteoarthritis, right knee: Secondary | ICD-10-CM | POA: Diagnosis not present

## 2016-08-07 DIAGNOSIS — M25561 Pain in right knee: Secondary | ICD-10-CM

## 2016-08-07 DIAGNOSIS — E278 Other specified disorders of adrenal gland: Secondary | ICD-10-CM

## 2016-08-07 DIAGNOSIS — R229 Localized swelling, mass and lump, unspecified: Secondary | ICD-10-CM

## 2016-08-07 DIAGNOSIS — E279 Disorder of adrenal gland, unspecified: Secondary | ICD-10-CM | POA: Diagnosis not present

## 2016-08-07 DIAGNOSIS — Z9049 Acquired absence of other specified parts of digestive tract: Secondary | ICD-10-CM | POA: Diagnosis not present

## 2016-08-07 DIAGNOSIS — R928 Other abnormal and inconclusive findings on diagnostic imaging of breast: Secondary | ICD-10-CM | POA: Diagnosis not present

## 2016-08-07 DIAGNOSIS — Z9889 Other specified postprocedural states: Secondary | ICD-10-CM

## 2016-08-07 DIAGNOSIS — M25569 Pain in unspecified knee: Secondary | ICD-10-CM | POA: Insufficient documentation

## 2016-08-07 DIAGNOSIS — IMO0002 Reserved for concepts with insufficient information to code with codable children: Secondary | ICD-10-CM

## 2016-08-20 DIAGNOSIS — M25561 Pain in right knee: Secondary | ICD-10-CM | POA: Diagnosis not present

## 2016-08-20 DIAGNOSIS — M549 Dorsalgia, unspecified: Secondary | ICD-10-CM | POA: Diagnosis not present

## 2016-08-20 DIAGNOSIS — M797 Fibromyalgia: Secondary | ICD-10-CM | POA: Diagnosis not present

## 2016-08-20 DIAGNOSIS — G8929 Other chronic pain: Secondary | ICD-10-CM | POA: Diagnosis not present

## 2016-08-21 DIAGNOSIS — S62031D Displaced fracture of proximal third of navicular [scaphoid] bone of right wrist, subsequent encounter for fracture with routine healing: Secondary | ICD-10-CM | POA: Diagnosis not present

## 2016-09-29 DIAGNOSIS — G4733 Obstructive sleep apnea (adult) (pediatric): Secondary | ICD-10-CM | POA: Diagnosis not present

## 2016-10-02 DIAGNOSIS — M79671 Pain in right foot: Secondary | ICD-10-CM | POA: Diagnosis not present

## 2016-10-02 DIAGNOSIS — M25579 Pain in unspecified ankle and joints of unspecified foot: Secondary | ICD-10-CM | POA: Diagnosis not present

## 2016-10-02 DIAGNOSIS — M722 Plantar fascial fibromatosis: Secondary | ICD-10-CM | POA: Diagnosis not present

## 2016-10-02 DIAGNOSIS — M79672 Pain in left foot: Secondary | ICD-10-CM | POA: Diagnosis not present

## 2016-10-20 ENCOUNTER — Emergency Department (HOSPITAL_COMMUNITY): Payer: BLUE CROSS/BLUE SHIELD

## 2016-10-20 ENCOUNTER — Encounter (HOSPITAL_COMMUNITY): Payer: Self-pay | Admitting: *Deleted

## 2016-10-20 ENCOUNTER — Emergency Department (HOSPITAL_COMMUNITY)
Admission: EM | Admit: 2016-10-20 | Discharge: 2016-10-20 | Disposition: A | Discharge status: Home / Self Care | Payer: BLUE CROSS/BLUE SHIELD | Attending: Emergency Medicine | Admitting: Emergency Medicine

## 2016-10-20 DIAGNOSIS — E119 Type 2 diabetes mellitus without complications: Secondary | ICD-10-CM | POA: Insufficient documentation

## 2016-10-20 DIAGNOSIS — R079 Chest pain, unspecified: Secondary | ICD-10-CM | POA: Diagnosis not present

## 2016-10-20 DIAGNOSIS — Y939 Activity, unspecified: Secondary | ICD-10-CM | POA: Diagnosis not present

## 2016-10-20 DIAGNOSIS — M25511 Pain in right shoulder: Secondary | ICD-10-CM | POA: Diagnosis not present

## 2016-10-20 DIAGNOSIS — Y929 Unspecified place or not applicable: Secondary | ICD-10-CM | POA: Diagnosis not present

## 2016-10-20 DIAGNOSIS — Z853 Personal history of malignant neoplasm of breast: Secondary | ICD-10-CM | POA: Insufficient documentation

## 2016-10-20 DIAGNOSIS — Y999 Unspecified external cause status: Secondary | ICD-10-CM | POA: Insufficient documentation

## 2016-10-20 DIAGNOSIS — S20211A Contusion of right front wall of thorax, initial encounter: Secondary | ICD-10-CM

## 2016-10-20 DIAGNOSIS — I10 Essential (primary) hypertension: Secondary | ICD-10-CM | POA: Insufficient documentation

## 2016-10-20 DIAGNOSIS — S299XXA Unspecified injury of thorax, initial encounter: Secondary | ICD-10-CM | POA: Diagnosis not present

## 2016-10-20 DIAGNOSIS — Z79899 Other long term (current) drug therapy: Secondary | ICD-10-CM | POA: Diagnosis not present

## 2016-10-20 DIAGNOSIS — S4991XA Unspecified injury of right shoulder and upper arm, initial encounter: Secondary | ICD-10-CM | POA: Diagnosis not present

## 2016-10-20 DIAGNOSIS — W01198A Fall on same level from slipping, tripping and stumbling with subsequent striking against other object, initial encounter: Secondary | ICD-10-CM | POA: Insufficient documentation

## 2016-10-20 DIAGNOSIS — W19XXXA Unspecified fall, initial encounter: Secondary | ICD-10-CM

## 2016-10-20 MED ORDER — HYDROCODONE-ACETAMINOPHEN 5-325 MG PO TABS
1.0000 | ORAL_TABLET | Freq: Four times a day (QID) | ORAL | 0 refills | Status: DC | PRN
Start: 2016-10-20 — End: 2016-11-09

## 2016-10-20 MED ORDER — HYDROCODONE-ACETAMINOPHEN 5-325 MG PO TABS
1.0000 | ORAL_TABLET | Freq: Once | ORAL | Status: AC
  Administered 2016-10-20: 1 via ORAL
  Filled 2016-10-20: qty 1

## 2016-10-20 MED ORDER — IBUPROFEN 400 MG PO TABS: 400.0000 mg | ORAL_TABLET | Freq: Three times a day (TID) | ORAL | 0 refills | Status: DC | PRN

## 2016-10-20 NOTE — ED Triage Notes (Signed)
Pt tripped and fell over the edge of a trailer and struck her right breast.  Pt also reports pain and swelling to left leg and in left groin.  Pt is concerned about the pain in her right breast as she had a lumpectomy last year on this breast.

## 2016-10-20 NOTE — ED Provider Notes (Signed)
[place] DEPT Provider Note   CSN: [id number] Arrival date & time: 10/20/16  1631     History   Chief Complaint Chief Complaint  Patient presents with  . Fall    HPI Sarah Cooley is a 50 y.o. female.   Fall  This is a new problem. The current episode started 3 to 5 hours ago. The problem occurs rarely. The problem has not changed since onset.Associated symptoms include chest pain. Exacerbated by: coughing, sneezing, breathing. She has tried acetaminophen for the symptoms. The treatment provided mild relief.    Past Medical History:  Diagnosis Date  . Anxiety   . Arthritis   . Balance problem   . Brain aneurysm    2 mm ACA region aneurysm by 09/21/15 MRA  . Breast cancer ([place])   . Complication of anesthesia    heart rate drops and O2 sats drop  . Constipation   . Depression   . Diabetes mellitus without complication ([place])    not on medication at this time  . Elevated liver function tests   . Family history of adverse reaction to anesthesia    mom has n/v  . Family history of breast cancer   . Family history of colon cancer   . Fibromyalgia   . Fibromyalgia   . GERD (gastroesophageal reflux disease)    none recent  . H/O acute renal failure 09/22/15   admitted to [facility]  . Headache    occipital and temporal  . History of hiatal hernia   . Hyperlipemia   . Hyperlipidemia   . Hypertension   . Irritable bowel syndrome (IBS)   . Occasional tremors   . OSA (obstructive sleep apnea)    uses cpap with humidification  . PONV (postoperative nausea and vomiting)     Patient Active Problem List   Diagnosis Date Noted  . MGUS (monoclonal gammopathy of unknown significance) 03/15/2016  . Genetic testing 09/08/2015  . Family history of breast cancer   . Family history of colon cancer   . Breast cancer of lower-inner quadrant of right female breast ([place]) 08/26/2015  . Pain in the chest   . Cephalalgia   . Headache 07/17/2015  . Elevated  troponin 07/17/2015  . Hypertension 07/17/2015  . Obesity 07/17/2015    Past Surgical History:  Procedure Laterality Date  . ABDOMINAL HYSTERECTOMY    . BLADDER SURGERY    . BONE GRAFT HIP ILIAC CREST    . CHOLECYSTECTOMY    . FRACTURE SURGERY Right    wrist  . KNEE ARTHROSCOPY W/ ACL RECONSTRUCTION     right  . RADIOACTIVE SEED GUIDED MASTECTOMY WITH AXILLARY SENTINEL LYMPH NODE BIOPSY Right 10/13/2015   Procedure: RADIOACTIVE SEED GUIDED PARTIAL MASTECTOMY WITH AXILLARY SENTINEL LYMPH NODE BIOPSY;  Surgeon: Erroll Luna, MD;  Location: [place];  Service: General;  Laterality: Right;  . WRIST SURGERY      OB History    No data available       Home Medications    Prior to Admission medications   Medication Sig Start Date End Date Taking? Authorizing Provider  amLODipine (NORVASC) 5 MG tablet Take 10 mg by mouth daily. Reported on 03/15/2016 12/07/15  Yes Historical Provider, MD  clonazePAM (KLONOPIN) 1 MG tablet Take 1 mg by mouth See admin instructions. Take 1 tablet (1 mg) by mouth every morning and at night, may take an additional tablet mid-day if needed for anxiety   Yes Historical Provider, MD  FLUoxetine (  PROZAC) 20 MG capsule Take 20 mg by mouth daily.   Yes Historical Provider, MD  meloxicam (MOBIC) 15 MG tablet Take 15 mg by mouth daily.   Yes Historical Provider, MD  Microfibrillar Coll Hemostat (AVITENE FLOUR EX) Apply topically.   Yes Historical Provider, MD  oxyCODONE-acetaminophen (ROXICET) 5-325 MG tablet Take 1-2 tablets by mouth every 4 (four) hours as needed. Patient taking differently: Take 1-2 tablets by mouth every 4 (four) hours as needed for moderate pain or severe pain.  10/13/15  Yes Erroll Luna, MD  pantoprazole (PROTONIX) 40 MG tablet Take 40 mg by mouth 2 (two) times daily.   Yes Historical Provider, MD  traZODone (DESYREL) 50 MG tablet Take 100 mg by mouth at bedtime.  09/06/15  Yes Historical Provider, MD  triamcinolone (NASACORT ALLERGY 24HR) 55  MCG/ACT AERO nasal inhaler Place 2 sprays into both nostrils daily as needed (Allergies).    Yes Historical Provider, MD  HYDROcodone-acetaminophen (NORCO/VICODIN) 5-325 MG tablet Take 1-2 tablets by mouth every 6 (six) hours as needed for severe pain. 10/20/16   Merrily Pew, MD  ibuprofen (ADVIL,MOTRIN) 400 MG tablet Take 1 tablet (400 mg total) by mouth every 8 (eight) hours as needed for mild pain. 10/20/16   Merrily Pew, MD    Family History Family History  Problem Relation Age of Onset  . Hypertension Mother   . Autoimmune disease Mother   . Eczema Mother   . Heart disease Maternal Aunt   . Lung cancer Paternal Aunt   . Breast cancer Paternal Aunt     dx in her 91s  . Lung cancer Paternal Grandfather   . Colon cancer Paternal Uncle     dx in her 10s  . Colon cancer Paternal Uncle     dx in his 42s  . Melanoma Father 91  . Psoriasis Father   . Hypertension Father   . Heart disease Father   . Breast cancer Sister     dx in her 70s  . Lupus Sister   . COPD Maternal Uncle   . Colon cancer Paternal Uncle     dx in his 13s    Social History Social History  Substance Use Topics  . Smoking status: Never Smoker  . Smokeless tobacco: Never Used  . Alcohol use 0.0 oz/week     Comment: 2-3 times a year     Allergies   Shrimp [shellfish allergy]; Bactrim [sulfamethoxazole-trimethoprim]; Erythromycin; Minocycline hcl; Ramipril; Adhesive [tape]; Drixoral [brompheniramine-pseudoeph]; Lyrica [pregabalin]; and Sinutab [chlorphen-pseudoephed-apap]   Review of Systems Review of Systems  Cardiovascular: Positive for chest pain.  Genitourinary:       Perineal pain  All other systems reviewed and are negative.    Physical Exam Updated Vital Signs BP 131/74 (BP Location: Left Arm)   Pulse 80   Temp 97.2 F (36.2 C) (Temporal)   Resp 17   Ht 5\' 8"  (1.727 m)   Wt 225 lb (102.1 kg)   SpO2 98%   BMI 34.21 kg/m   Physical Exam  Constitutional: She is oriented to person,  place, and time. She appears well-developed and well-nourished.  HENT:  Head: Normocephalic and atraumatic.  Eyes: Conjunctivae are normal.  Neck: Normal range of motion.  Cardiovascular: Normal rate and regular rhythm.   Pulmonary/Chest: Effort normal. No stridor. No respiratory distress. She has no wheezes. She exhibits tenderness (right lateral in the area of 8th rib).  Abdominal: Soft. She exhibits no distension.  Musculoskeletal: Normal range of motion. She  exhibits tenderness (over right AC joint, no deformity, no pain with ROM).  Neurological: She is alert and oriented to person, place, and time. No cranial nerve deficit.  Skin: Skin is warm and dry.  Nursing note and vitals reviewed.    ED Treatments / Results  Labs (all labs ordered are listed, but only abnormal results are displayed) Labs Reviewed - No data to display  EKG  EKG Interpretation None       Radiology Dg Chest 2 View  Result Date: 10/20/2016 CLINICAL DATA:  Right chest pain after fall. EXAM: CHEST  2 VIEW COMPARISON:  Radiographs of July 19, 2016. FINDINGS: The heart size and mediastinal contours are within normal limits. Both lungs are clear. No pneumothorax or pleural effusion is noted. The visualized skeletal structures are unremarkable. IMPRESSION: No active cardiopulmonary disease. Electronically Signed   By: Marijo Conception, M.D.   On: 10/20/2016 18:24   Dg Shoulder Right  Result Date: 10/20/2016 CLINICAL DATA:  Right shoulder pain secondary to a fall. EXAM: RIGHT SHOULDER - 2+ VIEW COMPARISON:  02/12/2012 FINDINGS: There is no evidence of fracture or dislocation. There is no evidence of arthropathy or other focal bone abnormality. Soft tissues are unremarkable. IMPRESSION: Negative. Electronically Signed   By: Lorriane Shire M.D.   On: 10/20/2016 18:31    Procedures Procedures (including critical care time)  Medications Ordered in ED Medications  HYDROcodone-acetaminophen (NORCO/VICODIN)  5-325 MG per tablet 1 tablet (1 tablet Oral Given 10/20/16 1740)     Initial Impression / Assessment and Plan / ED Course  I have reviewed the triage vital signs and the nursing notes.  Pertinent labs & imaging results that were available during my care of the patient were reviewed by me and considered in my medical decision making (see chart for details).  Clinical Course     XR's ok. Suspect occult rib fx, will treat for same. Possible tailbone injury, but will treat conserviatively at this time. Plan for dc on supportive care with PCP follow up as needed.   Final Clinical Impressions(s) / ED Diagnoses   Final diagnoses:  Fall, initial encounter  Contusion of right front wall of thorax, initial encounter    New Prescriptions New Prescriptions   HYDROCODONE-ACETAMINOPHEN (NORCO/VICODIN) 5-325 MG TABLET    Take 1-2 tablets by mouth every 6 (six) hours as needed for severe pain.   IBUPROFEN (ADVIL,MOTRIN) 400 MG TABLET    Take 1 tablet (400 mg total) by mouth every 8 (eight) hours as needed for mild pain.     Merrily Pew, MD 10/20/16 [phone]

## 2016-10-20 NOTE — ED Notes (Signed)
Patient transported to X-ray 

## 2016-10-23 ENCOUNTER — Other Ambulatory Visit (HOSPITAL_COMMUNITY): Payer: Self-pay | Admitting: *Deleted

## 2016-10-23 DIAGNOSIS — C50311 Malignant neoplasm of lower-inner quadrant of right female breast: Secondary | ICD-10-CM

## 2016-10-25 ENCOUNTER — Other Ambulatory Visit (HOSPITAL_COMMUNITY): Payer: BC Managed Care – PPO

## 2016-10-30 ENCOUNTER — Encounter (HOSPITAL_COMMUNITY): Payer: BLUE CROSS/BLUE SHIELD

## 2016-10-30 ENCOUNTER — Encounter (HOSPITAL_COMMUNITY): Payer: BLUE CROSS/BLUE SHIELD | Attending: Hematology & Oncology | Admitting: Hematology & Oncology

## 2016-10-30 ENCOUNTER — Encounter (HOSPITAL_COMMUNITY): Payer: Self-pay | Admitting: Hematology & Oncology

## 2016-10-30 VITALS — BP 127/79 | HR 62 | Temp 97.6°F | Resp 16 | Wt 235.8 lb

## 2016-10-30 DIAGNOSIS — R5383 Other fatigue: Secondary | ICD-10-CM

## 2016-10-30 DIAGNOSIS — Z801 Family history of malignant neoplasm of trachea, bronchus and lung: Secondary | ICD-10-CM | POA: Insufficient documentation

## 2016-10-30 DIAGNOSIS — Z9889 Other specified postprocedural states: Secondary | ICD-10-CM | POA: Diagnosis not present

## 2016-10-30 DIAGNOSIS — Z9049 Acquired absence of other specified parts of digestive tract: Secondary | ICD-10-CM | POA: Insufficient documentation

## 2016-10-30 DIAGNOSIS — C50311 Malignant neoplasm of lower-inner quadrant of right female breast: Secondary | ICD-10-CM

## 2016-10-30 DIAGNOSIS — Z171 Estrogen receptor negative status [ER-]: Secondary | ICD-10-CM | POA: Diagnosis not present

## 2016-10-30 DIAGNOSIS — Z800 Family history of malignant neoplasm of digestive organs: Secondary | ICD-10-CM | POA: Diagnosis not present

## 2016-10-30 DIAGNOSIS — E119 Type 2 diabetes mellitus without complications: Secondary | ICD-10-CM | POA: Diagnosis not present

## 2016-10-30 DIAGNOSIS — Z923 Personal history of irradiation: Secondary | ICD-10-CM | POA: Diagnosis not present

## 2016-10-30 DIAGNOSIS — D0511 Intraductal carcinoma in situ of right breast: Secondary | ICD-10-CM | POA: Diagnosis not present

## 2016-10-30 DIAGNOSIS — Z825 Family history of asthma and other chronic lower respiratory diseases: Secondary | ICD-10-CM | POA: Insufficient documentation

## 2016-10-30 DIAGNOSIS — F418 Other specified anxiety disorders: Secondary | ICD-10-CM

## 2016-10-30 DIAGNOSIS — R5382 Chronic fatigue, unspecified: Secondary | ICD-10-CM

## 2016-10-30 DIAGNOSIS — Z90710 Acquired absence of both cervix and uterus: Secondary | ICD-10-CM | POA: Diagnosis not present

## 2016-10-30 DIAGNOSIS — Z170 Estrogen receptor positive status [ER+]: Secondary | ICD-10-CM

## 2016-10-30 DIAGNOSIS — F419 Anxiety disorder, unspecified: Secondary | ICD-10-CM | POA: Insufficient documentation

## 2016-10-30 DIAGNOSIS — K219 Gastro-esophageal reflux disease without esophagitis: Secondary | ICD-10-CM | POA: Insufficient documentation

## 2016-10-30 DIAGNOSIS — Z8249 Family history of ischemic heart disease and other diseases of the circulatory system: Secondary | ICD-10-CM | POA: Diagnosis not present

## 2016-10-30 DIAGNOSIS — K589 Irritable bowel syndrome without diarrhea: Secondary | ICD-10-CM | POA: Insufficient documentation

## 2016-10-30 DIAGNOSIS — D472 Monoclonal gammopathy: Secondary | ICD-10-CM

## 2016-10-30 DIAGNOSIS — R0602 Shortness of breath: Secondary | ICD-10-CM | POA: Diagnosis not present

## 2016-10-30 DIAGNOSIS — G4733 Obstructive sleep apnea (adult) (pediatric): Secondary | ICD-10-CM | POA: Diagnosis not present

## 2016-10-30 LAB — COMPREHENSIVE METABOLIC PANEL
ALBUMIN: 4.2 g/dL (ref 3.5–5.0)
ALT: 42 U/L (ref 14–54)
AST: 35 U/L (ref 15–41)
Alkaline Phosphatase: 68 U/L (ref 38–126)
Anion gap: 10 (ref 5–15)
BUN: 14 mg/dL (ref 6–20)
CO2: 28 mmol/L (ref 22–32)
CREATININE: 0.92 mg/dL (ref 0.44–1.00)
Calcium: 9.3 mg/dL (ref 8.9–10.3)
Chloride: 97 mmol/L — ABNORMAL LOW (ref 101–111)
GFR calc Af Amer: >60 mL/min (ref >60)
GLUCOSE: 101 mg/dL — AB (ref 65–99)
POTASSIUM: 3.8 mmol/L (ref 3.5–5.1)
Sodium: 135 mmol/L (ref 135–145)
TOTAL PROTEIN: 7.7 g/dL (ref 6.5–8.1)
Total Bilirubin: 0.4 mg/dL (ref 0.3–1.2)

## 2016-10-30 LAB — CBC WITH DIFFERENTIAL/PLATELET
BASOS ABS: 0 10*3/uL (ref 0.0–0.1)
BASOS PCT: 0 %
Eosinophils Absolute: 0.2 10*3/uL (ref 0.0–0.7)
Eosinophils Relative: 3 %
HCT: 39.2 % (ref 36.0–46.0)
HEMOGLOBIN: 13.7 g/dL (ref 12.0–15.0)
LYMPHS PCT: 27 %
Lymphs Abs: 1.5 10*3/uL (ref 0.7–4.0)
MCH: 31.4 pg (ref 26.0–34.0)
MCHC: 34.9 g/dL (ref 30.0–36.0)
MCV: 89.9 fL (ref 78.0–100.0)
MONO ABS: 0.5 10*3/uL (ref 0.1–1.0)
Monocytes Relative: 10 %
NEUTROS ABS: 3.3 10*3/uL (ref 1.7–7.7)
NEUTROS PCT: 60 %
Platelets: 223 10*3/uL (ref 150–400)
RBC: 4.36 MIL/uL (ref 3.87–5.11)
RDW: 12.7 % (ref 11.5–15.5)
WBC: 5.6 10*3/uL (ref 4.0–10.5)

## 2016-10-30 NOTE — Patient Instructions (Signed)
Sarah Cooley at [facility] Discharge Instructions  RECOMMENDATIONS MADE BY THE CONSULTANT AND ANY TEST RESULTS WILL BE SENT TO YOUR REFERRING PHYSICIAN.  You were seen today by Dr. Whitney Muse Follow up in 6 months with labs We will refer you to Dr. Lysle Morales  Thank you for choosing [place] at [facility] to provide your oncology and hematology care.  To afford each patient quality time with our provider, please arrive at least 15 minutes before your scheduled appointment time.    If you have a lab appointment with the [facility] please come in thru the  Main Entrance and check in at the main information desk  You need to re-schedule your appointment should you arrive 10 or more minutes late.  We strive to give you quality time with our providers, and arriving late affects you and other patients whose appointments are after yours.  Also, if you no show three or more times for appointments you may be dismissed from the clinic at the providers discretion.     Again, thank you for choosing [facility].  Our hope is that these requests will decrease the amount of time that you wait before being seen by our physicians.       _____________________________________________________________  Should you have questions after your visit to [facility], please contact our office at [phone] between the hours of 8:30 a.m. and 4:30 p.m.  Voicemails left after 4:30 p.m. will not be returned until the following business day.  For prescription refill requests, have your pharmacy contact our office.       Resources For Cancer Patients and their Caregivers ? American Cancer Society: Can assist with transportation, wigs, general needs, runs Look Good Feel Better.        517-452-3690 ? Cancer Care: Provides financial assistance, online support groups, medication/co-pay assistance.  1-800-813-HOPE 970-418-3197) ? [place] Assists [place] cancer patients and their families through emotional , educational and financial support.  732-406-1809 ? [place] DSS Where to apply for food stamps, Medicaid and utility assistance. 650-795-9183 ? [facility]: Transportation to medical appointments. 639-421-7533 ? Social Security Administration: May apply for disability if have a Stage IV cancer. (412)438-5853 6570640967 ? [employer], Disability and Transit Services: Assists with nutrition, care and transit needs. [place] Support Programs: @10RELATIVEDAYS @ > Cancer Support Group  2nd Tuesday of the month 1pm-2pm, Journey Room  > Creative Journey  3rd Tuesday of the month 1130am-1pm, Journey Room  > Look Good Feel Better  1st Wednesday of the month 10am-12 noon, Journey Room (Call [place] to register [phone])

## 2016-10-30 NOTE — Progress Notes (Signed)
[facility] [place], [place], NP [place] / [place] [address]  DIAGNOSIS: Breast cancer of lower-inner quadrant of right female breast [facility])   Staging form: Breast, AJCC 7th Edition     Cooley stage from 08/31/2015: Stage 0 (Tis (DCIS), N0, M0) - Unsigned       Staging comments: Staged at breast conference on 11.9.16    SUMMARY OF ONCOLOGIC HISTORY: Oncology History   Stage 0: Right breast DCIS, high-grade, ER/PR negative     Breast cancer of lower-inner quadrant of right female breast ([place])   08/19/2015 Breast US    Masslike asymmetry within the lower inner quadrant of the right breast, at posterior depth, with associated microcalcifications which are new.       08/23/2015 Initial Biopsy    AT LEAST HIGH DUCTAL CARCINOMA IN SITU      08/23/2015 Receptors her2    Estrogen Receptor: 0%, NEGATIVE Progesterone Receptor: 0%, NEGATIVE      08/26/2015 Initial Diagnosis    Breast cancer of lower-inner quadrant of right female breast ([place])      08/31/2015 Procedure    GeneDx negative.  Genes tested include: ATM, BRCA1, BRCA2, CDH1, CHEK2, PALB2, PTEN, & TP53.       10/13/2015 Pathologic Stage    pTis, pN0: Stage 0       10/13/2015 Surgery    Right lumpectomy & SLNB ([name]). High grade DCIS, 0.4 cm. Negative margins.  1 right axillary SLN neg. ER/PR repeated and both remain negative.       11/17/2015 - 12/21/2015 Radiation Therapy    Treated at [facility]). Right breast: Total dose 50 Gy in 25 fractions. 3-D tangents; 6 MV photons.        CURRENT THERAPY: Observation  INTERVAL HISTORY: Sarah Cooley 51 y.o. female returns for follow-up of R breast cancer and light chain MGUS.   Sarah Cooley returns to the [place] today unaccompanied.  Since our last visit she went to the ED for a fall. She states work is driving her crazy. 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   I personally reviewed and  went over labs with the patient.  She fell because she was in the back of a trailer, when she went to get off the trailer, she states her legs tangled up, straddled the metal edge of the trailer, and when she fell off the trailer, she landed on her right breast. She was in a lot of pain and went to the ER for it on 10/20/16.   States Sunday was the worst she felt and it's getting worse.  She is very SOB, can't cough, can't sneeze without hollering.    She reports fatigue, she saw Dr. Lenna Cooley, and was told it may be chronic fatigue and fibromyalagia.   Her PCP has moved and she plans to get a new physician.  Said her legs and ankles were swollen, gets rash on legs intermittently.   Says appetite is good, but there are days when she doesn't eat well and skips meals.  States she can't bend over to pick up anything. It is hard to get up and down off the toilet.   She states she tries to do positive things.   During the physical examination she had difficulty transitioning from lying flat to sitting up.  She is up to date on her well care.   MEDICAL HISTORY: Past Medical History:  Diagnosis Date  . Anxiety   . Arthritis   . Balance problem   . Brain aneurysm    2 mm ACA region aneurysm by 09/21/15 MRA  . Breast cancer ([place])   . Complication of anesthesia    heart rate drops and O2 sats drop  . Constipation   . Depression   . Diabetes mellitus without complication ([place])    not on medication at this time  . Elevated liver function tests   . Family history of adverse reaction to anesthesia    mom has n/v  . Family history of breast cancer   . Family history of colon cancer   . Fibromyalgia   . Fibromyalgia   . GERD (gastroesophageal reflux disease)    none recent  . H/O acute renal failure 09/22/15   admitted to [facility]  . Headache    occipital and temporal  . History of hiatal hernia   . Hyperlipemia   . Hyperlipidemia   . Hypertension   . Irritable bowel  syndrome (IBS)   . Occasional tremors   . OSA (obstructive sleep apnea)    uses cpap with humidification  . PONV (postoperative nausea and vomiting)     has Headache; Elevated troponin; Hypertension; Obesity; Pain in the chest; Cephalalgia; Breast cancer of lower-inner quadrant of right female breast ([place]); Family history of breast cancer; Family history of colon cancer; Genetic testing; MGUS (monoclonal gammopathy of unknown significance); and Closed displaced fracture of proximal third of navicular bone of right wrist on her problem list.     is allergic to shrimp [shellfish allergy]; bactrim [sulfamethoxazole-trimethoprim]; erythromycin; minocycline hcl; ramipril; adhesive [tape]; drixoral [brompheniramine-pseudoeph]; lyrica [pregabalin]; and sinutab [chlorphen-pseudoephed-apap].  SURGICAL HISTORY: Past Surgical History:  Procedure Laterality Date  . ABDOMINAL HYSTERECTOMY    . BLADDER SURGERY    . BONE GRAFT HIP ILIAC CREST    . CHOLECYSTECTOMY    . FRACTURE SURGERY Right    wrist  . KNEE ARTHROSCOPY W/ ACL RECONSTRUCTION     right  . RADIOACTIVE SEED GUIDED MASTECTOMY WITH AXILLARY SENTINEL LYMPH NODE BIOPSY Right 10/13/2015   Procedure: RADIOACTIVE SEED GUIDED PARTIAL MASTECTOMY WITH AXILLARY SENTINEL LYMPH NODE BIOPSY;  Surgeon: Erroll Luna, MD;  Location: [facility];  Service: General;  Laterality: Right;  . WRIST SURGERY      SOCIAL HISTORY: Social History   Social History  . Marital status: Married    Spouse name: Sarah Cooley  . Number of children: 2  . Years of education: N/A   Occupational History  . physician office    Social History Main Topics  . Smoking status: Never Smoker  . Smokeless tobacco: Never Used  . Alcohol use 0.0 oz/week     Comment: 2-3 times a year  . Drug use: No  . Sexual activity: Yes   Other Topics Concern  . Not on file   Social History Narrative  . 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 The patient's husband, Sarah Cooley, is a Curator. The patient has 2 children from a prior marriage, Sarah Cooley who lives in [place] and is disabled secondary to bipolar disorder; and Sarah Cooley, who lives in [place] and works as a Curator and equal treat him. The patient has 2 grandchildren and one on the way. Sarah Cooley has a daughter from an earlier marriage Cyril Mourning who works as an Therapist, sports in [place]. Yarelli attends a [employer].  GYNECOLOGIC HISTORY:  No LMP recorded. Patient has had a hysterectomy. Menarche age 33, first live birth at 41. The patient is GX P2. She underwent partial hysterectomy, without salpingo-oophorectomy, remotely. She did not take hormone replacement. She did use oral contraceptives for approximately 3 years in her 43s, with no complications    FAMILY HISTORY: Family History  Problem Relation Age of Onset  . Hypertension Mother   . Autoimmune disease Mother   . Eczema Mother   . Heart disease Maternal Aunt   . Lung cancer Paternal Aunt   . Breast cancer Paternal Aunt     dx in her 80s  . Lung cancer Paternal Grandfather   . Colon cancer Paternal Uncle     dx in her 15s  . Colon cancer Paternal Uncle     dx in his 10s  . Melanoma Father 18  . Psoriasis Father   . Hypertension Father   . Heart disease Father   . Breast cancer Sister     dx in her 30s  . Lupus Sister   . COPD Maternal Uncle   . Colon cancer Paternal Uncle     dx in his 44s  The patient's parents are living, in their mid 21s. The patient has one brother, and one sister. On the paternal side, the patient's father had melanoma diagnosed in his 51s. The paternal grandfather, who died at the age of 55, had a history of lung and colon cancer. There are 2 uncles with colon cancer and one with lung cancer as well as one aunt on the paternal side with breast cancer diagnosed in her late 42s. The patient's only sister underwent lumpectomy at  the age of 26, but the patient does not know the exact results.    Review of Systems  Constitutional: Positive for malaise/fatigue (chronic).       Wears glasses.  Chronic fatigue   HENT: Negative.   Eyes: Negative.   Respiratory: Positive for shortness of breath.   Cardiovascular: Positive for leg swelling.  Gastrointestinal: Negative.   Genitourinary: Negative.   Musculoskeletal: Positive for back pain and falls.       Back pain. Larey Seat recently. Falls from balance issues.  Skin: Positive for rash (intermittently rash on legs). Negative for itching.  Endo/Heme/Allergies: Negative.   All other systems reviewed and are negative. 14 point review of systems was performed and is negative except as detailed under history of present illness and above   PHYSICAL EXAMINATION  ECOG PERFORMANCE STATUS: 1 - Symptomatic but completely ambulatory  Vitals:   10/30/16 1143  BP: 127/79  Pulse: 62  Resp: 16  Temp: 97.6 F (36.4 C)    Physical Exam  Constitutional: She is oriented to person, place, and time and well-developed, well-nourished, and in no distress.  HENT:  Head: Normocephalic and atraumatic.  Nose: Nose normal.  Mouth/Throat: Oropharynx is clear and moist. No oropharyngeal exudate.  Eyes: Conjunctivae and EOM are normal. Pupils are equal, round, and reactive to light. Right eye exhibits no discharge. Left eye exhibits no discharge. No scleral icterus.  Neck: Normal range of motion. Neck supple. No tracheal deviation present. No thyromegaly present.  Cardiovascular: Normal rate, regular rhythm and normal heart sounds.  Exam reveals no gallop and no friction rub.   No murmur heard. Pulmonary/Chest: Effort normal and breath sounds normal. She has no wheezes. She has no rales.  Abdominal: Soft. Bowel sounds are normal. She exhibits no distension and no mass. There is no tenderness.  There is no rebound and no guarding.  Musculoskeletal: Normal range of motion. She exhibits no  edema.  Lymphadenopathy:    She has no cervical adenopathy.  Neurological: She is alert and oriented to person, place, and time. She has normal reflexes. No cranial nerve deficit. Gait normal. Coordination normal.  Skin: Skin is warm and dry. No rash noted.  Psychiatric:  Anxiety, depression  Nursing note and vitals reviewed.   LABORATORY DATA: I have reviewed the data as listed.  CBC    Component Value Date/Time   WBC 5.6 10/30/2016 1117   RBC 4.36 10/30/2016 1117   HGB 13.7 10/30/2016 1117   HGB 15.5 08/31/2015 1217   HCT 39.2 10/30/2016 1117   HCT 45.4 08/31/2015 1217   PLT 223 10/30/2016 1117   PLT 253 08/31/2015 1217   MCV 89.9 10/30/2016 1117   MCV 95.0 08/31/2015 1217   MCH 31.4 10/30/2016 1117   MCHC 34.9 10/30/2016 1117   RDW 12.7 10/30/2016 1117   RDW 13.5 08/31/2015 1217   LYMPHSABS 1.5 10/30/2016 1117   LYMPHSABS 2.3 08/31/2015 1217   MONOABS 0.5 10/30/2016 1117   MONOABS 0.5 08/31/2015 1217   EOSABS 0.2 10/30/2016 1117   EOSABS 0.1 08/31/2015 1217   BASOSABS 0.0 10/30/2016 1117   BASOSABS 0.0 08/31/2015 1217   CMP     Component Value Date/Time   NA 135 10/30/2016 1117   NA 138 08/31/2015 1217   K 3.8 10/30/2016 1117   K 2.6 (LL) 08/31/2015 1217   CL 97 (L) 10/30/2016 1117   CO2 28 10/30/2016 1117   CO2 27 08/31/2015 1217   GLUCOSE 101 (H) 10/30/2016 1117   GLUCOSE 114 08/31/2015 1217   BUN 14 10/30/2016 1117   BUN 10.3 08/31/2015 1217   CREATININE 0.92 10/30/2016 1117   CREATININE 0.8 08/31/2015 1217   CALCIUM 9.3 10/30/2016 1117   CALCIUM 9.7 08/31/2015 1217   PROT 7.7 10/30/2016 1117   PROT 7.8 08/31/2015 1217   ALBUMIN 4.2 10/30/2016 1117   ALBUMIN 3.9 08/31/2015 1217   AST 35 10/30/2016 1117   AST 24 08/31/2015 1217   ALT 42 10/30/2016 1117   ALT 37 08/31/2015 1217   ALKPHOS 68 10/30/2016 1117   ALKPHOS 70 08/31/2015 1217   BILITOT 0.4 10/30/2016 1117   BILITOT 0.64 08/31/2015 1217   GFRNONAA >60 10/30/2016 1117   GFRAA >60  10/30/2016 1117          RADIOLOGY: I have reviewed the images below and agree with the reported results  Study Result   Cooley DATA:  Right shoulder pain secondary to a fall.  EXAM: RIGHT SHOULDER - 2+ VIEW  COMPARISON:  02/12/2012  FINDINGS: There is no evidence of fracture or dislocation. There is no evidence of arthropathy or other focal bone abnormality. Soft tissues are unremarkable.  IMPRESSION: Negative.   Electronically Signed   By: Lorriane Shire M.D.   On: 10/20/2016 18:31   Study Result   Cooley DATA:  Right chest pain after fall.  EXAM: CHEST  2 VIEW  COMPARISON:  Radiographs of July 19, 2016.  FINDINGS: The heart size and mediastinal contours are within normal limits. Both lungs are clear. No pneumothorax or pleural effusion is noted. The visualized skeletal structures are unremarkable.  IMPRESSION: No active cardiopulmonary disease.   Electronically Signed   By: Marijo Conception, M.D.   On: 10/20/2016 18:24     PATHOLOGY:  Bone Marrow Biopsy      Flow Cytometry  Lumpectomy     ASSESSMENT and THERAPY PLAN:  High Grade DCIS R breast ER- PR- Negative Sentinel LN Right breast seed localized partial mastectomy with right axillary sentinel lymph node mapping Dr. Brantley Stage 10/13/2015 Adjuvant XRT Anxiety about health MGUS, light chain  I discussed MGUS with the patient. We reviewed her bone marrow pathology. I would just follow her labs every 6 months or so. In regards to her breast cancer diagnosis. 6 month interval follow-up will also be appropriate.   She continues to have a lot of anxiety about her health. I have encouraged her to continue to work, will address working with her counselor more and/or support groups moving forward.  Labs reviewed. Results noted above.   We will call with pending lab results.  She will follow up with our clinic in 6 months.   Orders Placed This Encounter    Procedures  . CBC with Differential    Standing Status:   Future    Standing Expiration Date:   10/30/2017  . Comprehensive metabolic panel    Standing Status:   Future    Standing Expiration Date:   10/30/2017  . Kappa/lambda light chains    Standing Status:   Future    Standing Expiration Date:   10/30/2017  . Beta 2 microglobuline, serum    Standing Status:   Future    Standing Expiration Date:   10/30/2017  . IgG, IgA, IgM    Standing Status:   Future    Standing Expiration Date:   10/30/2017  . Immunofixation electrophoresis    Standing Status:   Future    Standing Expiration Date:   10/30/2017  . Protein electrophoresis, serum    Standing Status:   Future    Standing Expiration Date:   10/30/2017    All questions were answered. The patient knows to call the clinic with any problems, questions or concerns. We can certainly see the patient much sooner if necessary.  This document serves as a record of services personally performed by Ancil Linsey, MD. It was created on her behalf by Shirlean Mylar, a trained medical scribe. The creation of this record is based on the scribe's personal observations and the provider's statements to them. This document has been checked and approved by the attending provider.  I have reviewed the above documentation for accuracy and completeness, and I agree with the above.  This note was electronically signed. Molli Hazard, MD  10/30/2016

## 2016-10-31 LAB — KAPPA/LAMBDA LIGHT CHAINS
KAPPA FREE LGHT CHN: 23.8 mg/L — AB (ref 3.3–19.4)
Kappa, lambda light chain ratio: 1.34 (ref 0.26–1.65)
Lambda free light chains: 17.8 mg/L (ref 5.7–26.3)

## 2016-10-31 LAB — PROTEIN ELECTROPHORESIS, SERUM
A/G RATIO SPE: 1.1 (ref 0.7–1.7)
ALBUMIN ELP: 4 g/dL (ref 2.9–4.4)
Alpha-1-Globulin: 0.2 g/dL (ref 0.0–0.4)
Alpha-2-Globulin: 0.7 g/dL (ref 0.4–1.0)
BETA GLOBULIN: 1.5 g/dL — AB (ref 0.7–1.3)
Gamma Globulin: 1.1 g/dL (ref 0.4–1.8)
Globulin, Total: 3.5 g/dL (ref 2.2–3.9)
PDF SPE: 0
Total Protein ELP: 7.5 g/dL (ref 6.0–8.5)

## 2016-10-31 LAB — IMMUNOFIXATION ELECTROPHORESIS
IGG (IMMUNOGLOBIN G), SERUM: 1093 mg/dL (ref 700–1600)
IgA: 485 mg/dL — ABNORMAL HIGH (ref 87–352)
IgM, Serum: 78 mg/dL (ref 26–217)
Total Protein ELP: 7.4 g/dL (ref 6.0–8.5)

## 2016-10-31 LAB — BETA 2 MICROGLOBULIN, SERUM: Beta-2 Microglobulin: 1.6 mg/L (ref 0.6–2.4)

## 2016-10-31 LAB — IGG, IGA, IGM
IGG (IMMUNOGLOBIN G), SERUM: 1033 mg/dL (ref 700–1600)
IGM, SERUM: 76 mg/dL (ref 26–217)
IgA: 484 mg/dL — ABNORMAL HIGH (ref 87–352)

## 2016-11-05 DIAGNOSIS — M722 Plantar fascial fibromatosis: Secondary | ICD-10-CM | POA: Diagnosis not present

## 2016-11-05 DIAGNOSIS — M79671 Pain in right foot: Secondary | ICD-10-CM | POA: Diagnosis not present

## 2016-11-05 DIAGNOSIS — M79672 Pain in left foot: Secondary | ICD-10-CM | POA: Diagnosis not present

## 2016-11-09 ENCOUNTER — Encounter: Payer: Self-pay | Admitting: Family Medicine

## 2016-11-09 ENCOUNTER — Ambulatory Visit (INDEPENDENT_AMBULATORY_CARE_PROVIDER_SITE_OTHER): Payer: BLUE CROSS/BLUE SHIELD | Admitting: Family Medicine

## 2016-11-09 VITALS — BP 120/84 | HR 72 | Temp 98.1°F | Resp 18 | Ht 68.0 in | Wt 235.0 lb

## 2016-11-09 DIAGNOSIS — F329 Major depressive disorder, single episode, unspecified: Secondary | ICD-10-CM | POA: Insufficient documentation

## 2016-11-09 DIAGNOSIS — M797 Fibromyalgia: Secondary | ICD-10-CM | POA: Diagnosis not present

## 2016-11-09 DIAGNOSIS — Z7689 Persons encountering health services in other specified circumstances: Secondary | ICD-10-CM

## 2016-11-09 DIAGNOSIS — I10 Essential (primary) hypertension: Secondary | ICD-10-CM

## 2016-11-09 DIAGNOSIS — F32A Depression, unspecified: Secondary | ICD-10-CM | POA: Insufficient documentation

## 2016-11-09 DIAGNOSIS — F419 Anxiety disorder, unspecified: Secondary | ICD-10-CM

## 2016-11-09 DIAGNOSIS — K589 Irritable bowel syndrome without diarrhea: Secondary | ICD-10-CM | POA: Insufficient documentation

## 2016-11-09 DIAGNOSIS — K581 Irritable bowel syndrome with constipation: Secondary | ICD-10-CM

## 2016-11-09 DIAGNOSIS — K219 Gastro-esophageal reflux disease without esophagitis: Secondary | ICD-10-CM | POA: Insufficient documentation

## 2016-11-09 DIAGNOSIS — F418 Other specified anxiety disorders: Secondary | ICD-10-CM

## 2016-11-09 MED ORDER — CYCLOBENZAPRINE HCL 10 MG PO TABS: 10.0000 mg | ORAL_TABLET | Freq: Three times a day (TID) | ORAL | 0 refills | Status: DC | PRN

## 2016-11-09 NOTE — Patient Instructions (Addendum)
Need records from prior PCP  Continue current medicine  Try to walk or get some exercise daily  See me in a month  Take the cyclobenzaprine at bedtime

## 2016-11-09 NOTE — Progress Notes (Signed)
Chief Complaint  Patient presents with  . Establish Care  new to establish Old records requested Mammograms up to date through cancer center due to Dx breast cancer PAP not indicated colonoscopy done a t age 51 Shots up to date  Has chronic pain from fibromyalgia and osteoarthritis.  Takes mobic daily,  Has GERD and acid reflux symptoms so takes protonix BID.  With her fibromyalgia syndrome she has sleep disturbance, IBS, chronic fatigue and depression. Has tried and failed gabapentin and lyrica.  Tries not to take narcotics.  Is on fluoxetine 60 mg a day. Take s Klonopin  BID to TID daily for chronic anxiety.  I explained that I do not use daily benzodiazapine for anxiety.  I am referring her to psychiatry.      Patient Active Problem List   Diagnosis Date Noted  . Fibromyalgia 11/09/2016  . IBS (irritable bowel syndrome) 11/09/2016  . GERD (gastroesophageal reflux disease) 11/09/2016  . Anxiety and depression 11/09/2016  . Closed displaced fracture of proximal third of navicular bone of right wrist 06/11/2016  . MGUS (monoclonal gammopathy of unknown significance) 03/15/2016  . Genetic testing 09/08/2015  . Family history of breast cancer   . Family history of colon cancer   . Breast cancer of lower-inner quadrant of right female breast ([place]) 08/26/2015  . Headache 07/17/2015  . Hypertension 07/17/2015  . Obesity (BMI 35.0-39.9 without comorbidity) 07/17/2015    Outpatient Encounter Prescriptions as of 11/09/2016  Medication Sig  . amLODipine (NORVASC) 10 MG tablet Take 10 mg by mouth daily.  . clonazePAM (KLONOPIN) 1 MG tablet Take 1 mg by mouth 3 (three) times daily as needed.   . cyclobenzaprine (FLEXERIL) 10 MG tablet Take 1 tablet (10 mg total) by mouth 3 (three) times daily as needed for muscle spasms.  Marland Kitchen FLUoxetine (PROZAC) 40 MG capsule Take 60 mg by mouth daily.  Marland Kitchen ibuprofen (ADVIL,MOTRIN) 800 MG tablet Take 800 mg by mouth every 8 (eight) hours as needed.  .  meloxicam (MOBIC) 15 MG tablet Take 15 mg by mouth daily.  . pantoprazole (PROTONIX) 40 MG tablet Take 40 mg by mouth 2 (two) times daily.    No facility-administered encounter medications on file as of 11/09/2016.     Past Medical History:  Diagnosis Date  . Allergy    seasonal  . Anxiety   . Arthritis   . Balance problem   . Brain aneurysm    2 mm ACA region aneurysm by 09/21/15 MRA  . Breast cancer ([place])   . Chronic kidney disease    acute renal failure one occasion  . Complication of anesthesia    heart rate drops and O2 sats drop  . Constipation   . Depression   . Diabetes mellitus without complication ([place])    not on medication at this time  . Elevated liver function tests   . Family history of adverse reaction to anesthesia    mom has n/v  . Family history of breast cancer   . Family history of colon cancer   . Fibromyalgia   . Fibromyalgia   . GERD (gastroesophageal reflux disease)    none recent  . H/O acute renal failure 09/22/15   admitted to [facility]  . Headache    occipital and temporal  . History of hiatal hernia   . Hyperlipemia   . Hyperlipidemia   . Hypertension   . Irritable bowel syndrome (IBS)   . Neuromuscular disorder ([place])  fibromyalgia  . Occasional tremors   . OSA (obstructive sleep apnea)    uses cpap with humidification  . PONV (postoperative nausea and vomiting)     Past Surgical History:  Procedure Laterality Date  . ABDOMINAL HYSTERECTOMY     adenomyosis  . BLADDER SURGERY    . BONE GRAFT HIP ILIAC CREST    . BREAST SURGERY    . CHOLECYSTECTOMY    . FRACTURE SURGERY Right    wrist  . KNEE ARTHROSCOPY W/ ACL RECONSTRUCTION     right  . RADIOACTIVE SEED GUIDED MASTECTOMY WITH AXILLARY SENTINEL LYMPH NODE BIOPSY Right 10/13/2015   Procedure: RADIOACTIVE SEED GUIDED PARTIAL MASTECTOMY WITH AXILLARY SENTINEL LYMPH NODE BIOPSY;  Surgeon: Erroll Luna, MD;  Location: [facility];  Service: General;  Laterality: Right;  .  WRIST SURGERY      Social History   Social History  . Marital status: Married    Spouse name: Psychologist, clinical  . Number of children: 2  . Years of education: 12   Occupational History  . physician office    Social History Main Topics  . Smoking status: Never Smoker  . Smokeless tobacco: Never Used  . Alcohol use No     Comment: maybe one a year  . Drug use: No  . Sexual activity: Yes    Birth control/ protection: Surgical   Other Topics Concern  . Not on file   Social History Narrative   Husband on disability   Children grown and moved   worries about family illness- mother and sister       Family History  Problem Relation Age of Onset  . Hypertension Mother   . Autoimmune disease Mother     lichen planus  . Eczema Mother   . Parkinson's disease Mother   . Mental illness Mother     bipolar  . Heart disease Maternal Aunt   . Lung cancer Paternal Aunt   . Breast cancer Paternal Aunt     dx in her 70s  . Lung cancer Paternal Grandfather   . Colon cancer Paternal Uncle     dx in her 58s  . Colon cancer Paternal Uncle     dx in his 35s  . Melanoma Father 33  . Psoriasis Father   . Hypertension Father   . Heart disease Father   . Breast cancer Sister     dx in her 85s  . Lupus Sister   . COPD Maternal Uncle   . Colon cancer Paternal Uncle     dx in his 46s  . Mental illness Son     bipolar    Review of Systems  Constitutional: Positive for malaise/fatigue. Negative for chills, fever and weight loss.       Chronic  HENT: Negative for congestion and hearing loss.   Eyes: Negative for blurred vision and pain.  Respiratory: Negative for cough and shortness of breath.   Cardiovascular: Positive for chest pain. Negative for leg swelling.       Current rib pain from fall  Gastrointestinal: Positive for abdominal pain, constipation and heartburn. Negative for diarrhea.       IBS-GERD  Genitourinary: Negative for dysuria and frequency.  Musculoskeletal: Positive for  back pain, falls, joint pain, myalgias and neck pain.       Chronic  Neurological: Positive for headaches. Negative for dizziness and seizures.       Frequent  Psychiatric/Behavioral: Positive for depression. Negative for substance abuse. The patient is  nervous/anxious and has insomnia.     BP 120/84 (BP Location: Left Arm, Patient Position: Sitting, Cuff Size: Normal)   Pulse 72   Temp 98.1 F (36.7 C) (Oral)   Resp 18   Ht 5\' 8"  (1.727 m)   Wt 235 lb 0.6 oz (106.6 kg)   SpO2 98%   BMI 35.74 kg/m   Physical Exam  Constitutional: She is oriented to person, place, and time. She appears well-developed and well-nourished. No distress.  overweight  HENT:  Head: Normocephalic and atraumatic.  Mouth/Throat: Oropharynx is clear and moist.  Eyes: Conjunctivae are normal. Pupils are equal, round, and reactive to light.  Neck: Normal range of motion. No thyromegaly present.  Cardiovascular: Normal rate, regular rhythm and normal heart sounds.   Pulmonary/Chest: Effort normal and breath sounds normal.  Right lower rib cage tender  Musculoskeletal: Normal range of motion. She exhibits no edema.  Lymphadenopathy:    She has no cervical adenopathy.  Neurological: She is alert and oriented to person, place, and time.  Psychiatric: She has a normal mood and affect. Her behavior is normal. Thought content normal.   ASSESSMENT/PLAN:  1. Fibromyalgia   2. Irritable bowel syndrome with constipation   3. Essential hypertension  - Hemoglobin A1c - Lipid panel - Urinalysis, Routine w reflex microscopic - VITAMIN D 25 Hydroxy (Vit-D Deficiency, Fractures) - TSH  4. Gastroesophageal reflux disease without esophagitis   5. Encounter to establish care with new doctor   6. Anxiety and depression  - Ambulatory referral to Psychiatry   Patient Instructions  Need records from prior PCP  Continue current medicine  Try to walk or get some exercise daily  See me in a month  Take  the cyclobenzaprine at bedtime      Raylene Everts, MD

## 2016-11-21 ENCOUNTER — Encounter (HOSPITAL_COMMUNITY): Payer: Self-pay | Admitting: Hematology & Oncology

## 2016-11-27 DIAGNOSIS — M62172 Other rupture of muscle (nontraumatic), left ankle and foot: Secondary | ICD-10-CM | POA: Diagnosis not present

## 2016-11-27 DIAGNOSIS — M79671 Pain in right foot: Secondary | ICD-10-CM | POA: Diagnosis not present

## 2016-12-10 ENCOUNTER — Encounter: Payer: Self-pay | Admitting: Family Medicine

## 2016-12-10 ENCOUNTER — Ambulatory Visit (INDEPENDENT_AMBULATORY_CARE_PROVIDER_SITE_OTHER): Payer: BLUE CROSS/BLUE SHIELD | Admitting: Family Medicine

## 2016-12-10 VITALS — BP 120/88 | HR 88 | Temp 97.8°F | Resp 16 | Ht 68.0 in | Wt 240.0 lb

## 2016-12-10 DIAGNOSIS — F32A Depression, unspecified: Secondary | ICD-10-CM

## 2016-12-10 DIAGNOSIS — F419 Anxiety disorder, unspecified: Principal | ICD-10-CM

## 2016-12-10 DIAGNOSIS — F418 Other specified anxiety disorders: Secondary | ICD-10-CM

## 2016-12-10 DIAGNOSIS — G4733 Obstructive sleep apnea (adult) (pediatric): Secondary | ICD-10-CM | POA: Insufficient documentation

## 2016-12-10 DIAGNOSIS — M797 Fibromyalgia: Secondary | ICD-10-CM

## 2016-12-10 DIAGNOSIS — M722 Plantar fascial fibromatosis: Secondary | ICD-10-CM | POA: Insufficient documentation

## 2016-12-10 DIAGNOSIS — Z9989 Dependence on other enabling machines and devices: Secondary | ICD-10-CM

## 2016-12-10 DIAGNOSIS — F329 Major depressive disorder, single episode, unspecified: Secondary | ICD-10-CM

## 2016-12-10 NOTE — Patient Instructions (Signed)
Stay as active as you can manage No change in medicine  Contact Dr Harrington Challenger about your appointment  See me in 3 months Call sooner for problems

## 2016-12-10 NOTE — Progress Notes (Signed)
Chief Complaint  Patient presents with  . Follow-up    1 month   Patient is here for follow-up. She continues to have multiple fibromyalgia complaints. She has anxiety and depression that are reasonably stable. She's been referred to a psychiatrist but has not yet been able to make an appointment. She did not feel well enough to go to work today because she's having difficulty sleeping. She tells me that she did have a sleep study a couple years back and has known obstructive sleep apnea. She uses a CPAP. She states that her legs moved over 200 times during the night but she was not diagnosed with restless leg syndrome. She is using the cyclobenzaprine at night which helps somewhat with muscle spasms, but not sleep. She does take melatonin. She was treated in the past with gabapentin for her fibromyalgia. This did not help her fibromyalgia or her restless leg syndrome symptoms. She has had plantar fasciitis in her right foot. Recently she had increased pain and was told by her foot and ankle doctor that she has torn her plantar fascia. She is in a boot for 12 weeks.   Patient Active Problem List   Diagnosis Date Noted  . Obstructive sleep apnea on CPAP 12/10/2016  . Plantar fasciitis, bilateral 12/10/2016  . Fibromyalgia 11/09/2016  . IBS (irritable bowel syndrome) 11/09/2016  . GERD (gastroesophageal reflux disease) 11/09/2016  . Anxiety and depression 11/09/2016  . Closed displaced fracture of proximal third of navicular bone of right wrist 06/11/2016  . MGUS (monoclonal gammopathy of unknown significance) 03/15/2016  . Genetic testing 09/08/2015  . Family history of breast cancer   . Family history of colon cancer   . Breast cancer of lower-inner quadrant of right female breast ([place]) 08/26/2015  . Headache 07/17/2015  . Hypertension 07/17/2015  . Obesity (BMI 35.0-39.9 without comorbidity) 07/17/2015    Outpatient Encounter Prescriptions as of 12/10/2016  Medication Sig  .  amLODipine (NORVASC) 10 MG tablet Take 10 mg by mouth daily.  . clonazePAM (KLONOPIN) 1 MG tablet Take 1 mg by mouth 3 (three) times daily as needed.   . cyclobenzaprine (FLEXERIL) 10 MG tablet Take 1 tablet (10 mg total) by mouth 3 (three) times daily as needed for muscle spasms.  Marland Kitchen FLUoxetine (PROZAC) 40 MG capsule Take 60 mg by mouth daily.  Marland Kitchen ibuprofen (ADVIL,MOTRIN) 800 MG tablet Take 800 mg by mouth every 8 (eight) hours as needed.  . meloxicam (MOBIC) 15 MG tablet Take 15 mg by mouth daily.  . pantoprazole (PROTONIX) 40 MG tablet Take 40 mg by mouth 2 (two) times daily.    No facility-administered encounter medications on file as of 12/10/2016.     Allergies  Allergen Reactions  . Ramipril Nausea And Vomiting and Other (See Comments)    Ramipril--Caused Pancreatitis  . Shrimp [Shellfish Allergy] Anaphylaxis and Swelling  . Minocycline Hcl Hives  . Adhesive [Tape] Rash    Please use paper tape  . Bactrim [Sulfamethoxazole-Trimethoprim] Other (See Comments)    May have caused kidney issues  . Drixoral [Brompheniramine-Pseudoeph] Rash  . Erythromycin Other (See Comments)    GI- Upset / severe abdominal pain  . Lyrica [Pregabalin] Anxiety    Dizziness and worsening depression  . Sinutab [Chlorphen-Pseudoephed-Apap] Rash    Review of Systems  Constitutional: Positive for fatigue. Negative for unexpected weight change.       Chronic fatigue, poor sleep  HENT: Positive for tinnitus. Negative for congestion and postnasal drip.  Chronic tinnitus  Eyes: Negative for photophobia.  Respiratory: Negative for cough.        Occasional shortness of breath  Cardiovascular: Negative for chest pain and palpitations.  Gastrointestinal: Positive for constipation and diarrhea.       Irritable bowel syndrome  Genitourinary: Negative for difficulty urinating.  Musculoskeletal: Positive for myalgias, neck pain and neck stiffness.  Neurological: Positive for headaches.    Psychiatric/Behavioral: Positive for dysphoric mood and sleep disturbance. The patient is nervous/anxious.    BP 120/88 (BP Location: Left Arm, Patient Position: Sitting, Cuff Size: Normal)   Pulse 88   Temp 97.8 F (36.6 C) (Temporal)   Resp 16   Ht 5\' 8"  (1.727 m)   Wt 240 lb (108.9 kg)   SpO2 97%   BMI 36.49 kg/m   Physical Exam  Constitutional: She is oriented to person, place, and time. She appears well-nourished. No distress.  Overweight  HENT:  Head: Normocephalic and atraumatic.  Right Ear: External ear normal.  Left Ear: External ear normal.  Mouth/Throat: Oropharynx is clear and moist.  Eyes: Conjunctivae are normal. Pupils are equal, round, and reactive to light.  Neck: Normal range of motion.  Cardiovascular: Normal rate, regular rhythm and normal heart sounds.   Pulmonary/Chest: Effort normal and breath sounds normal. She has no wheezes.  Abdominal: Soft. Bowel sounds are normal.  Musculoskeletal: She exhibits tenderness.  Lymphadenopathy:    She has no cervical adenopathy.  Neurological: She is alert and oriented to person, place, and time.  Psychiatric: She has a normal mood and affect. Her behavior is normal.    ASSESSMENT  1. Obstructive sleep apnea on CPAP *Continued poor sleep quality  2. Plantar fasciitis, bilateral *Recent reinjury right foot  3. Anxiety and depression *Stable. Refer to psychiatry  4. Fibromyalgia *Stable. Discussed the importance of trying to lose weight and to exercise to improve her fibromyalgia symptoms. This is difficult since she is currently in a fracture boot.    Patient Instructions  Stay as active as you can manage No change in medicine  Contact Dr Harrington Challenger about your appointment  See me in 3 months Call sooner for problems     Raylene Everts, MD

## 2016-12-25 ENCOUNTER — Telehealth (HOSPITAL_COMMUNITY): Payer: Self-pay | Admitting: *Deleted

## 2016-12-25 DIAGNOSIS — M79671 Pain in right foot: Secondary | ICD-10-CM | POA: Diagnosis not present

## 2016-12-25 DIAGNOSIS — M25579 Pain in unspecified ankle and joints of unspecified foot: Secondary | ICD-10-CM | POA: Diagnosis not present

## 2016-12-25 NOTE — Telephone Encounter (Signed)
left voice message reegarding appointment.

## 2017-01-10 ENCOUNTER — Telehealth (HOSPITAL_COMMUNITY): Payer: Self-pay | Admitting: *Deleted

## 2017-01-10 NOTE — Telephone Encounter (Signed)
left voice message regarding appointment. 

## 2017-01-25 DIAGNOSIS — F329 Major depressive disorder, single episode, unspecified: Secondary | ICD-10-CM | POA: Diagnosis not present

## 2017-01-25 DIAGNOSIS — K219 Gastro-esophageal reflux disease without esophagitis: Secondary | ICD-10-CM | POA: Diagnosis not present

## 2017-01-25 DIAGNOSIS — E279 Disorder of adrenal gland, unspecified: Secondary | ICD-10-CM | POA: Diagnosis not present

## 2017-01-25 DIAGNOSIS — E785 Hyperlipidemia, unspecified: Secondary | ICD-10-CM | POA: Diagnosis not present

## 2017-01-25 DIAGNOSIS — Z299 Encounter for prophylactic measures, unspecified: Secondary | ICD-10-CM | POA: Diagnosis not present

## 2017-01-25 DIAGNOSIS — I10 Essential (primary) hypertension: Secondary | ICD-10-CM | POA: Diagnosis not present

## 2017-01-25 DIAGNOSIS — E559 Vitamin D deficiency, unspecified: Secondary | ICD-10-CM | POA: Diagnosis not present

## 2017-01-25 DIAGNOSIS — G250 Essential tremor: Secondary | ICD-10-CM | POA: Diagnosis not present

## 2017-01-25 DIAGNOSIS — F419 Anxiety disorder, unspecified: Secondary | ICD-10-CM | POA: Diagnosis not present

## 2017-01-25 DIAGNOSIS — Z6838 Body mass index (BMI) 38.0-38.9, adult: Secondary | ICD-10-CM | POA: Diagnosis not present

## 2017-01-25 DIAGNOSIS — Z713 Dietary counseling and surveillance: Secondary | ICD-10-CM | POA: Diagnosis not present

## 2017-01-28 DIAGNOSIS — E279 Disorder of adrenal gland, unspecified: Secondary | ICD-10-CM | POA: Diagnosis not present

## 2017-02-05 DIAGNOSIS — M722 Plantar fascial fibromatosis: Secondary | ICD-10-CM | POA: Diagnosis not present

## 2017-02-05 DIAGNOSIS — M25579 Pain in unspecified ankle and joints of unspecified foot: Secondary | ICD-10-CM | POA: Diagnosis not present

## 2017-02-05 DIAGNOSIS — M79671 Pain in right foot: Secondary | ICD-10-CM | POA: Diagnosis not present

## 2017-02-13 DIAGNOSIS — R5383 Other fatigue: Secondary | ICD-10-CM | POA: Diagnosis not present

## 2017-02-13 DIAGNOSIS — I10 Essential (primary) hypertension: Secondary | ICD-10-CM | POA: Diagnosis not present

## 2017-02-13 DIAGNOSIS — E785 Hyperlipidemia, unspecified: Secondary | ICD-10-CM | POA: Diagnosis not present

## 2017-02-13 DIAGNOSIS — Z713 Dietary counseling and surveillance: Secondary | ICD-10-CM | POA: Diagnosis not present

## 2017-02-13 DIAGNOSIS — E559 Vitamin D deficiency, unspecified: Secondary | ICD-10-CM | POA: Diagnosis not present

## 2017-02-13 DIAGNOSIS — Z6838 Body mass index (BMI) 38.0-38.9, adult: Secondary | ICD-10-CM | POA: Diagnosis not present

## 2017-02-13 DIAGNOSIS — Z299 Encounter for prophylactic measures, unspecified: Secondary | ICD-10-CM | POA: Diagnosis not present

## 2017-02-13 DIAGNOSIS — M797 Fibromyalgia: Secondary | ICD-10-CM | POA: Diagnosis not present

## 2017-02-13 DIAGNOSIS — N179 Acute kidney failure, unspecified: Secondary | ICD-10-CM | POA: Diagnosis not present

## 2017-03-07 DIAGNOSIS — R091 Pleurisy: Secondary | ICD-10-CM | POA: Diagnosis not present

## 2017-03-07 DIAGNOSIS — N6452 Nipple discharge: Secondary | ICD-10-CM | POA: Diagnosis not present

## 2017-03-07 DIAGNOSIS — Z299 Encounter for prophylactic measures, unspecified: Secondary | ICD-10-CM | POA: Diagnosis not present

## 2017-03-07 DIAGNOSIS — Z713 Dietary counseling and surveillance: Secondary | ICD-10-CM | POA: Diagnosis not present

## 2017-03-07 DIAGNOSIS — I10 Essential (primary) hypertension: Secondary | ICD-10-CM | POA: Diagnosis not present

## 2017-03-07 DIAGNOSIS — E785 Hyperlipidemia, unspecified: Secondary | ICD-10-CM | POA: Diagnosis not present

## 2017-03-07 DIAGNOSIS — M797 Fibromyalgia: Secondary | ICD-10-CM | POA: Diagnosis not present

## 2017-03-07 DIAGNOSIS — Z6838 Body mass index (BMI) 38.0-38.9, adult: Secondary | ICD-10-CM | POA: Diagnosis not present

## 2017-03-11 ENCOUNTER — Other Ambulatory Visit (HOSPITAL_COMMUNITY): Payer: Self-pay | Admitting: Nurse Practitioner

## 2017-03-11 DIAGNOSIS — N6452 Nipple discharge: Secondary | ICD-10-CM

## 2017-03-19 ENCOUNTER — Ambulatory Visit (HOSPITAL_COMMUNITY)
Admission: RE | Admit: 2017-03-19 | Discharge: 2017-03-19 | Disposition: A | Discharge status: Home / Self Care | Payer: BLUE CROSS/BLUE SHIELD | Source: Ambulatory Visit | Attending: Nurse Practitioner | Admitting: Nurse Practitioner

## 2017-03-19 DIAGNOSIS — N6489 Other specified disorders of breast: Secondary | ICD-10-CM | POA: Diagnosis not present

## 2017-03-19 DIAGNOSIS — Z9889 Other specified postprocedural states: Secondary | ICD-10-CM | POA: Insufficient documentation

## 2017-03-19 DIAGNOSIS — Z923 Personal history of irradiation: Secondary | ICD-10-CM | POA: Insufficient documentation

## 2017-03-19 DIAGNOSIS — N6452 Nipple discharge: Secondary | ICD-10-CM

## 2017-03-19 DIAGNOSIS — R922 Inconclusive mammogram: Secondary | ICD-10-CM | POA: Diagnosis not present

## 2017-03-21 DIAGNOSIS — E279 Disorder of adrenal gland, unspecified: Secondary | ICD-10-CM | POA: Diagnosis not present

## 2017-03-21 DIAGNOSIS — N6452 Nipple discharge: Secondary | ICD-10-CM | POA: Diagnosis not present

## 2017-03-21 DIAGNOSIS — Z299 Encounter for prophylactic measures, unspecified: Secondary | ICD-10-CM | POA: Diagnosis not present

## 2017-03-21 DIAGNOSIS — I10 Essential (primary) hypertension: Secondary | ICD-10-CM | POA: Diagnosis not present

## 2017-03-21 DIAGNOSIS — G250 Essential tremor: Secondary | ICD-10-CM | POA: Diagnosis not present

## 2017-03-21 DIAGNOSIS — F329 Major depressive disorder, single episode, unspecified: Secondary | ICD-10-CM | POA: Diagnosis not present

## 2017-03-21 DIAGNOSIS — F419 Anxiety disorder, unspecified: Secondary | ICD-10-CM | POA: Diagnosis not present

## 2017-03-21 DIAGNOSIS — K219 Gastro-esophageal reflux disease without esophagitis: Secondary | ICD-10-CM | POA: Diagnosis not present

## 2017-03-21 DIAGNOSIS — Z6836 Body mass index (BMI) 36.0-36.9, adult: Secondary | ICD-10-CM | POA: Diagnosis not present

## 2017-03-21 DIAGNOSIS — E785 Hyperlipidemia, unspecified: Secondary | ICD-10-CM | POA: Diagnosis not present

## 2017-03-25 DIAGNOSIS — I10 Essential (primary) hypertension: Secondary | ICD-10-CM | POA: Diagnosis not present

## 2017-04-02 DIAGNOSIS — I10 Essential (primary) hypertension: Secondary | ICD-10-CM | POA: Diagnosis not present

## 2017-04-02 DIAGNOSIS — Z299 Encounter for prophylactic measures, unspecified: Secondary | ICD-10-CM | POA: Diagnosis not present

## 2017-04-02 DIAGNOSIS — E785 Hyperlipidemia, unspecified: Secondary | ICD-10-CM | POA: Diagnosis not present

## 2017-04-02 DIAGNOSIS — Z6838 Body mass index (BMI) 38.0-38.9, adult: Secondary | ICD-10-CM | POA: Diagnosis not present

## 2017-04-02 DIAGNOSIS — R51 Headache: Secondary | ICD-10-CM | POA: Diagnosis not present

## 2017-04-02 DIAGNOSIS — K219 Gastro-esophageal reflux disease without esophagitis: Secondary | ICD-10-CM | POA: Diagnosis not present

## 2017-04-05 ENCOUNTER — Other Ambulatory Visit: Payer: Self-pay | Admitting: Nurse Practitioner

## 2017-04-05 DIAGNOSIS — Z853 Personal history of malignant neoplasm of breast: Secondary | ICD-10-CM

## 2017-04-05 DIAGNOSIS — R922 Inconclusive mammogram: Secondary | ICD-10-CM

## 2017-04-05 DIAGNOSIS — N6452 Nipple discharge: Secondary | ICD-10-CM

## 2017-04-19 ENCOUNTER — Ambulatory Visit
Admission: RE | Admit: 2017-04-19 | Discharge: 2017-04-19 | Disposition: A | Discharge status: Home / Self Care | Payer: BLUE CROSS/BLUE SHIELD | Source: Ambulatory Visit | Attending: Nurse Practitioner | Admitting: Nurse Practitioner

## 2017-04-19 DIAGNOSIS — R922 Inconclusive mammogram: Secondary | ICD-10-CM

## 2017-04-19 DIAGNOSIS — Z853 Personal history of malignant neoplasm of breast: Secondary | ICD-10-CM

## 2017-04-19 DIAGNOSIS — N6452 Nipple discharge: Secondary | ICD-10-CM

## 2017-04-19 DIAGNOSIS — N6322 Unspecified lump in the left breast, upper inner quadrant: Secondary | ICD-10-CM | POA: Diagnosis not present

## 2017-04-19 MED ORDER — GADOBENATE DIMEGLUMINE 529 MG/ML IV SOLN
20.0000 mL | Freq: Once | INTRAVENOUS | Status: AC | PRN
  Administered 2017-04-19: 20 mL via INTRAVENOUS

## 2017-04-23 ENCOUNTER — Other Ambulatory Visit: Payer: Self-pay | Admitting: Nurse Practitioner

## 2017-04-23 DIAGNOSIS — N63 Unspecified lump in unspecified breast: Secondary | ICD-10-CM

## 2017-04-23 DIAGNOSIS — Z6837 Body mass index (BMI) 37.0-37.9, adult: Secondary | ICD-10-CM | POA: Diagnosis not present

## 2017-04-23 DIAGNOSIS — I10 Essential (primary) hypertension: Secondary | ICD-10-CM | POA: Diagnosis not present

## 2017-04-23 DIAGNOSIS — M797 Fibromyalgia: Secondary | ICD-10-CM | POA: Diagnosis not present

## 2017-04-23 DIAGNOSIS — Z299 Encounter for prophylactic measures, unspecified: Secondary | ICD-10-CM | POA: Diagnosis not present

## 2017-04-23 DIAGNOSIS — R928 Other abnormal and inconclusive findings on diagnostic imaging of breast: Secondary | ICD-10-CM | POA: Diagnosis not present

## 2017-04-26 ENCOUNTER — Other Ambulatory Visit (HOSPITAL_COMMUNITY): Payer: Self-pay | Admitting: *Deleted

## 2017-04-26 DIAGNOSIS — C50311 Malignant neoplasm of lower-inner quadrant of right female breast: Secondary | ICD-10-CM

## 2017-04-29 ENCOUNTER — Encounter (HOSPITAL_COMMUNITY): Payer: Self-pay

## 2017-04-29 ENCOUNTER — Other Ambulatory Visit (HOSPITAL_COMMUNITY): Payer: BLUE CROSS/BLUE SHIELD

## 2017-04-29 ENCOUNTER — Ambulatory Visit (HOSPITAL_COMMUNITY): Payer: BLUE CROSS/BLUE SHIELD

## 2017-04-29 ENCOUNTER — Encounter (HOSPITAL_BASED_OUTPATIENT_CLINIC_OR_DEPARTMENT_OTHER): Payer: BLUE CROSS/BLUE SHIELD | Admitting: Oncology

## 2017-04-29 ENCOUNTER — Encounter (HOSPITAL_COMMUNITY): Payer: BLUE CROSS/BLUE SHIELD | Attending: Oncology

## 2017-04-29 VITALS — BP 151/89 | HR 55 | Resp 16 | Ht 68.0 in | Wt 246.0 lb

## 2017-04-29 DIAGNOSIS — Z800 Family history of malignant neoplasm of digestive organs: Secondary | ICD-10-CM | POA: Diagnosis not present

## 2017-04-29 DIAGNOSIS — C50311 Malignant neoplasm of lower-inner quadrant of right female breast: Secondary | ICD-10-CM

## 2017-04-29 DIAGNOSIS — R5382 Chronic fatigue, unspecified: Secondary | ICD-10-CM | POA: Diagnosis not present

## 2017-04-29 DIAGNOSIS — K219 Gastro-esophageal reflux disease without esophagitis: Secondary | ICD-10-CM | POA: Insufficient documentation

## 2017-04-29 DIAGNOSIS — F418 Other specified anxiety disorders: Secondary | ICD-10-CM | POA: Diagnosis not present

## 2017-04-29 DIAGNOSIS — F419 Anxiety disorder, unspecified: Secondary | ICD-10-CM | POA: Insufficient documentation

## 2017-04-29 DIAGNOSIS — G4733 Obstructive sleep apnea (adult) (pediatric): Secondary | ICD-10-CM | POA: Insufficient documentation

## 2017-04-29 DIAGNOSIS — Z803 Family history of malignant neoplasm of breast: Secondary | ICD-10-CM | POA: Insufficient documentation

## 2017-04-29 DIAGNOSIS — Z171 Estrogen receptor negative status [ER-]: Secondary | ICD-10-CM | POA: Insufficient documentation

## 2017-04-29 DIAGNOSIS — G709 Myoneural disorder, unspecified: Secondary | ICD-10-CM | POA: Insufficient documentation

## 2017-04-29 DIAGNOSIS — N6322 Unspecified lump in the left breast, upper inner quadrant: Secondary | ICD-10-CM

## 2017-04-29 DIAGNOSIS — K589 Irritable bowel syndrome without diarrhea: Secondary | ICD-10-CM | POA: Insufficient documentation

## 2017-04-29 DIAGNOSIS — D472 Monoclonal gammopathy: Secondary | ICD-10-CM

## 2017-04-29 DIAGNOSIS — M797 Fibromyalgia: Secondary | ICD-10-CM | POA: Diagnosis not present

## 2017-04-29 DIAGNOSIS — Z86000 Personal history of in-situ neoplasm of breast: Secondary | ICD-10-CM | POA: Diagnosis not present

## 2017-04-29 DIAGNOSIS — E1122 Type 2 diabetes mellitus with diabetic chronic kidney disease: Secondary | ICD-10-CM | POA: Diagnosis not present

## 2017-04-29 DIAGNOSIS — N189 Chronic kidney disease, unspecified: Secondary | ICD-10-CM | POA: Insufficient documentation

## 2017-04-29 DIAGNOSIS — E785 Hyperlipidemia, unspecified: Secondary | ICD-10-CM | POA: Insufficient documentation

## 2017-04-29 DIAGNOSIS — F319 Bipolar disorder, unspecified: Secondary | ICD-10-CM | POA: Insufficient documentation

## 2017-04-29 LAB — COMPREHENSIVE METABOLIC PANEL
ALT: 33 U/L (ref 14–54)
AST: 28 U/L (ref 15–41)
Albumin: 4.1 g/dL (ref 3.5–5.0)
Alkaline Phosphatase: 81 U/L (ref 38–126)
Anion gap: 7 (ref 5–15)
BUN: 14 mg/dL (ref 6–20)
CHLORIDE: 104 mmol/L (ref 101–111)
CO2: 26 mmol/L (ref 22–32)
Calcium: 9.3 mg/dL (ref 8.9–10.3)
Creatinine, Ser: 0.85 mg/dL (ref 0.44–1.00)
Glucose, Bld: 95 mg/dL (ref 65–99)
POTASSIUM: 4 mmol/L (ref 3.5–5.1)
SODIUM: 137 mmol/L (ref 135–145)
Total Bilirubin: 0.6 mg/dL (ref 0.3–1.2)
Total Protein: 7.6 g/dL (ref 6.5–8.1)

## 2017-04-29 LAB — CBC WITH DIFFERENTIAL/PLATELET
BASOS ABS: 0 10*3/uL (ref 0.0–0.1)
Basophils Relative: 0 %
EOS ABS: 0.1 10*3/uL (ref 0.0–0.7)
EOS PCT: 2 %
HCT: 39.9 % (ref 36.0–46.0)
Hemoglobin: 13.9 g/dL (ref 12.0–15.0)
LYMPHS PCT: 24 %
Lymphs Abs: 1.4 10*3/uL (ref 0.7–4.0)
MCH: 32.1 pg (ref 26.0–34.0)
MCHC: 34.8 g/dL (ref 30.0–36.0)
MCV: 92.1 fL (ref 78.0–100.0)
MONO ABS: 0.5 10*3/uL (ref 0.1–1.0)
Monocytes Relative: 8 %
Neutro Abs: 3.7 10*3/uL (ref 1.7–7.7)
Neutrophils Relative %: 66 %
PLATELETS: 192 10*3/uL (ref 150–400)
RBC: 4.33 MIL/uL (ref 3.87–5.11)
RDW: 12.8 % (ref 11.5–15.5)
WBC: 5.7 10*3/uL (ref 4.0–10.5)

## 2017-04-29 NOTE — Progress Notes (Signed)
[facility] [place] [address] / [place] [address]  DIAGNOSIS: Breast cancer of lower-inner quadrant of right female breast [facility])   Staging form: Breast, AJCC 7th Edition     Clinical stage from 08/31/2015: Stage 0 (Tis (DCIS), N0, M0) - Unsigned       Staging comments: Staged at breast conference on 11.9.16    SUMMARY OF ONCOLOGIC HISTORY: Oncology History   Stage 0: Right breast DCIS, high-grade, ER/PR negative     Breast cancer of lower-inner quadrant of right female breast ([place])   08/19/2015 Breast US    Masslike asymmetry within the lower inner quadrant of the right breast, at posterior depth, with associated microcalcifications which are new.       08/23/2015 Initial Biopsy    AT LEAST HIGH DUCTAL CARCINOMA IN SITU      08/23/2015 Receptors her2    Estrogen Receptor: 0%, NEGATIVE Progesterone Receptor: 0%, NEGATIVE      08/26/2015 Initial Diagnosis    Breast cancer of lower-inner quadrant of right female breast ([place])      08/31/2015 Procedure    [facility] negative.  Genes tested include: ATM, BRCA1, BRCA2, CDH1, CHEK2, PALB2, PTEN, & TP53.       10/13/2015 Pathologic Stage    pTis, pN0: Stage 0       10/13/2015 Surgery    Right lumpectomy & SLNB ([name]). High grade DCIS, 0.4 cm. Negative margins.  1 right axillary SLN neg. ER/PR repeated and both remain negative.       11/17/2015 - 12/21/2015 Radiation Therapy    Treated at [facility] [place] Right breast: Total dose 50 Gy in 25 fractions. 3-D tangents; 6 MV photons.        CURRENT THERAPY: Observation  INTERVAL HISTORY: Sarah Cooley 51 y.o. female returns for follow-up of R breast cancer and light chain MGUS.   Sarah Cooley returns to the [place] today with her husband.  Patient has been having nipple discharge. She has been having left nipple discharge for the past few months, and recently sought having right nipple  discharge a few weeks ago. She states it is yellow serous fluid; denies any bloody discharge. Diagnostic unilateral right mammogram on 03/19/17 demonstrated BI-RADS 2. MRI breast bilateral with and without contrast on 04/19/17 demonstrated irregular enhancing mass within the upper inner quadrant of the left breast, approximately 9:30 o'clock axis region, at middle depth, measuring 6 mm, with suspicious washout kinetics. Additional irregular enhancing mass within the upper-outer quadrant of the left breast, approximately 2 o'clock axis region, also at middle depth, measuring 7 mm, also with suspicious washout kinetics. Patient states she has a biopsy scheduled on 05/01/17. She continues to have chronic fatigue and states she feels like it has been getting worse lately.    MEDICAL HISTORY: Past Medical History:  Diagnosis Date  . Allergy    seasonal  . Anxiety   . Arthritis   . Balance problem   . Brain aneurysm    2 mm ACA region aneurysm by 09/21/15 MRA  . Breast cancer ([place])   . Chronic kidney disease    acute renal failure one occasion  . Complication of anesthesia    heart rate drops and O2 sats drop  . Constipation   . Depression   . Diabetes mellitus without complication ([place])    not on medication at this time  . Elevated liver function tests   .  Family history of adverse reaction to anesthesia    mom has n/v  . Family history of breast cancer   . Family history of colon cancer   . Fibromyalgia   . Fibromyalgia   . Genetic testing 09/08/2015   Negative genetic testing on the Breast/High Moderate Risk panel. The Breast High/Moderate Risk gene panel offered by [facility] includes sequencing and deletion/duplication analysis of the following 9 genes: ATM, BRCA1, BRCA2, CDH1, CHEK2, PALB2, PTEN, STK11, and TP53. The report date is September 08, 2015.  The Rest of the Comprehensive Cancer panel has been reflexed and will be available in 2-3 weeks.  POLD1 c.208G>T VUS found on the Remainder of  the Comprehensive cancer panel.  The Comprehensive Cancer Panel offered by [facility] includes sequencing and/or deletion duplication testing of the following 32 genes: APC, ATM, AXIN2, BARD1, BMPR1A, BRCA1, BRCA2, BRIP1, CDH1, CDK4, CDKN2A, CHEK2, EPCAM, FANCC, MLH1, MSH2, MSH6, MUTYH, NBN, PALB2, PMS2, POLD1, POLE, PTEN, RAD51C, RAD51D, SCG5/GREM1, SMAD4, STK11, TP53, VHL, and XRCC2.   The report date is 09/12/2015.   Marland Kitchen GERD (gastroesophageal reflux disease)    none recent  . H/O acute renal failure 09/22/15   admitted to [facility]  . Headache    occipital and temporal  . History of hiatal hernia   . Hyperlipemia   . Hyperlipidemia   . Hypertension   . Irritable bowel syndrome (IBS)   . Neuromuscular disorder ([facility])    fibromyalgia  . Occasional tremors   . OSA (obstructive sleep apnea)    uses cpap with humidification  . PONV (postoperative nausea and vomiting)     has Headache; Hypertension; Obesity (BMI 35.0-39.9 without comorbidity); Breast cancer of lower-inner quadrant of right female breast ([place]); MGUS (monoclonal gammopathy of unknown significance); Fibromyalgia; IBS (irritable bowel syndrome); GERD (gastroesophageal reflux disease); Anxiety and depression; Obstructive sleep apnea on CPAP; and Plantar fasciitis, bilateral on her problem list.     is allergic to ramipril; shrimp [shellfish allergy]; minocycline hcl; adhesive [tape]; bactrim [sulfamethoxazole-trimethoprim]; drixoral [brompheniramine-pseudoeph]; erythromycin; lyrica [pregabalin]; and sinutab [chlorphen-pseudoephed-apap].  SURGICAL HISTORY: Past Surgical History:  Procedure Laterality Date  . ABDOMINAL HYSTERECTOMY     adenomyosis  . BLADDER SURGERY    . BONE GRAFT HIP ILIAC CREST    . BREAST SURGERY    . CHOLECYSTECTOMY    . FRACTURE SURGERY Right    wrist  . KNEE ARTHROSCOPY W/ ACL RECONSTRUCTION     right  . RADIOACTIVE SEED GUIDED MASTECTOMY WITH AXILLARY SENTINEL LYMPH NODE BIOPSY Right 10/13/2015    Procedure: RADIOACTIVE SEED GUIDED PARTIAL MASTECTOMY WITH AXILLARY SENTINEL LYMPH NODE BIOPSY;  Surgeon: Erroll Luna, MD;  Location: [facility];  Service: General;  Laterality: Right;  . WRIST SURGERY      SOCIAL HISTORY: Social History   Social History  . Marital status: Married    Spouse name: Psychologist, clinical  . Number of children: 2  . Years of education: 12   Occupational History  . physician office    Social History Main Topics  . Smoking status: Never Smoker  . Smokeless tobacco: Never Used  . Alcohol use No     Comment: maybe one a year  . Drug use: No  . Sexual activity: Yes    Birth control/ protection: Surgical   Other Topics Concern  . 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 The patient's husband, [name], is a Curator. The patient has 2 children from a prior marriage, Duard Larsen who lives in [place] and is disabled secondary to bipolar disorder; and Melvenia Needles, who lives in [place] and works as a Curator and equal treat him. The patient has 2 grandchildren and one on the way. [name] has a daughter from an earlier marriage Cyril Mourning who works as an Therapist, sports in [place]. Adreona attends a [employer].  GYNECOLOGIC HISTORY:  No LMP recorded. Patient has had a hysterectomy. Menarche age 58, first live birth at 31. The patient is GX P2. She underwent partial hysterectomy, without salpingo-oophorectomy, remotely. She did not take hormone replacement. She did use oral contraceptives for approximately 3 years in her 89W, with no complications    FAMILY HISTORY: Family History  Problem Relation Age of Onset  . Hypertension Mother   . Autoimmune disease Mother        lichen planus  . Eczema Mother   . Parkinson's disease Mother   . Mental  illness Mother        bipolar  . Heart disease Maternal Aunt   . Lung cancer Paternal Aunt   . Breast cancer Paternal Aunt        dx in her 73s  . Lung cancer Paternal Grandfather   . Colon cancer Paternal Uncle        dx in her 11s  . Colon cancer Paternal Uncle        dx in his 55s  . Melanoma Father 74  . Psoriasis Father   . Hypertension Father   . Heart disease Father   . Breast cancer Sister        dx in her 47s  . Lupus Sister   . COPD Maternal Uncle   . Colon cancer Paternal Uncle        dx in his 41s  . Mental illness Son        bipolar  The patient's parents are living, in their mid 87s. The patient has one brother, and one sister. On the paternal side, the patient's father had melanoma diagnosed in his 50s. The paternal grandfather, who died at the age of 64, had a history of lung and colon cancer. There are 2 uncles with colon cancer and one with lung cancer as well as one aunt on the paternal side with breast cancer diagnosed in her late 78s. The patient's only sister underwent lumpectomy at the age of 54, but the patient does not know the exact results.    Review of Systems  Constitutional: Positive for malaise/fatigue (chronic).       Chronic fatigue   HENT: Negative.   Eyes: Negative.   Respiratory: Negative for shortness of breath.   Cardiovascular: Negative for leg swelling.  Gastrointestinal: Negative.   Genitourinary: Negative.   Musculoskeletal: Negative for back pain and falls.       Chronic muscle aches from fibromyalgia  Skin: Negative for itching and rash.  Endo/Heme/Allergies: Negative.   All other systems reviewed and are negative. 14 point review of systems was performed and is negative except as detailed under history of present illness and above   PHYSICAL EXAMINATION  ECOG PERFORMANCE STATUS: 1 - Symptomatic but completely ambulatory  Vitals:   04/29/17 1121  BP: (!) 151/89  Pulse: (!) 55  Resp: 16    Physical Exam    Constitutional:  She is oriented to person, place, and time and well-developed, well-nourished, and in no distress.  HENT:  Head: Normocephalic and atraumatic.  Nose: Nose normal.  Mouth/Throat: Oropharynx is clear and moist. No oropharyngeal exudate.  Eyes: Conjunctivae and EOM are normal. Pupils are equal, round, and reactive to light. Right eye exhibits no discharge. Left eye exhibits no discharge. No scleral icterus.  Neck: Normal range of motion. Neck supple. No tracheal deviation present. No thyromegaly present.  Cardiovascular: Normal rate, regular rhythm and normal heart sounds.  Exam reveals no gallop and no friction rub.   No murmur heard. Pulmonary/Chest: Effort normal and breath sounds normal. She has no wheezes. She has no rales. Right breast exhibits no inverted nipple, no mass, no nipple discharge, no skin change and no tenderness. Left breast exhibits no inverted nipple, no mass, no nipple discharge, no skin change and no tenderness.  Abdominal: Soft. Bowel sounds are normal. She exhibits no distension and no mass. There is no tenderness. There is no rebound and no guarding.  Musculoskeletal: Normal range of motion. She exhibits no edema.  Lymphadenopathy:    She has no cervical adenopathy.  Neurological: She is alert and oriented to person, place, and time. She has normal reflexes. No cranial nerve deficit. Gait normal. Coordination normal.  Skin: Skin is warm and dry. No rash noted.  Psychiatric:  Anxiety, depression  Nursing note and vitals reviewed.   LABORATORY DATA: I have reviewed the data as listed.  CBC    Component Value Date/Time   WBC 5.7 04/29/2017 1036   RBC 4.33 04/29/2017 1036   HGB 13.9 04/29/2017 1036   HGB 15.5 08/31/2015 1217   HCT 39.9 04/29/2017 1036   HCT 45.4 08/31/2015 1217   PLT 192 04/29/2017 1036   PLT 253 08/31/2015 1217   MCV 92.1 04/29/2017 1036   MCV 95.0 08/31/2015 1217   MCH 32.1 04/29/2017 1036   MCHC 34.8 04/29/2017 1036    RDW 12.8 04/29/2017 1036   RDW 13.5 08/31/2015 1217   LYMPHSABS 1.4 04/29/2017 1036   LYMPHSABS 2.3 08/31/2015 1217   MONOABS 0.5 04/29/2017 1036   MONOABS 0.5 08/31/2015 1217   EOSABS 0.1 04/29/2017 1036   EOSABS 0.1 08/31/2015 1217   BASOSABS 0.0 04/29/2017 1036   BASOSABS 0.0 08/31/2015 1217   CMP     Component Value Date/Time   NA 137 04/29/2017 1036   NA 138 08/31/2015 1217   K 4.0 04/29/2017 1036   K 2.6 (LL) 08/31/2015 1217   CL 104 04/29/2017 1036   CO2 26 04/29/2017 1036   CO2 27 08/31/2015 1217   GLUCOSE 95 04/29/2017 1036   GLUCOSE 114 08/31/2015 1217   BUN 14 04/29/2017 1036   BUN 10.3 08/31/2015 1217   CREATININE 0.85 04/29/2017 1036   CREATININE 0.8 08/31/2015 1217   CALCIUM 9.3 04/29/2017 1036   CALCIUM 9.7 08/31/2015 1217   PROT 7.6 04/29/2017 1036   PROT 7.8 08/31/2015 1217   ALBUMIN 4.1 04/29/2017 1036   ALBUMIN 3.9 08/31/2015 1217   AST 28 04/29/2017 1036   AST 24 08/31/2015 1217   ALT 33 04/29/2017 1036   ALT 37 08/31/2015 1217   ALKPHOS 81 04/29/2017 1036   ALKPHOS 70 08/31/2015 1217   BILITOT 0.6 04/29/2017 1036   BILITOT 0.64 08/31/2015 1217   GFRNONAA >60 04/29/2017 1036   GFRAA >60 04/29/2017 1036          RADIOLOGY: I have reviewed the images below and agree with the reported  results  MR BREAST BILATERAL W WO CONTRAST (Accession [id number]) (Order [id number])  Imaging  Date: 04/19/2017 Department: [facility] Released By: Adelene Amas Authorizing: Arsenio Katz, NP  Exam Information   Status Exam Begun  Exam Ended   Final [99] 04/19/2017 6:04 PM 04/19/2017 6:28 PM  PACS Images   Show images for MR BREAST BILATERAL W WO CONTRAST  Study Result   CLINICAL DATA:  Nipple discharge.  History of right breast cancer.  History of right breast cancer in 2016 status post lumpectomy and radiation therapy. Patient complains of bilateral yellowish nipple discharge for 3 months and breast pain for 6  months.  LABS:  BUN 11, creatinine 0.8, GFR 76  EXAM: BILATERAL BREAST MRI WITH AND WITHOUT CONTRAST  TECHNIQUE: Multiplanar, multisequence MR images of both breasts were obtained prior to and following the intravenous administration of 20 CC ml of MultiHance.  THREE-DIMENSIONAL MR IMAGE RENDERING ON INDEPENDENT WORKSTATION:  Three-dimensional MR images were rendered by post-processing of the original MR data on an independent workstation. The three-dimensional MR images were interpreted, and findings are reported in the following complete MRI report for this study. Three dimensional images were evaluated at the independent DynaCad workstation  COMPARISON:  No previous breast MRI. Comparison mammogram dated 03/19/2017.  FINDINGS: Breast composition: b. Scattered fibroglandular tissue.  Background parenchymal enhancement: Moderate.  Right breast: Post lumpectomy changes. No mass or abnormal enhancement. Paucity of background enhancement is expected given patient's history of previous radiation therapy.  Left breast: Irregular enhancing mass within the upper inner quadrant of the left breast, approximately 9:30 o'clock axis region, at middle depth, measuring 6 mm, with suspicious washout kinetics (series 6, image 112).  Additional irregular enhancing mass within the upper-outer quadrant of the left breast, approximately 2 o'clock axis region, also at middle depth, measuring 7 mm, also with suspicious washout kinetics (series 6, image 100).  Lymph nodes: No abnormal appearing lymph nodes.  Ancillary findings:  None.  IMPRESSION: 1. Enhancing mass within the upper inner quadrant of the left breast, measuring 6 mm, with suspicious washout kinetics. 2. Enhancing mass within the upper-outer quadrant of the left breast, measuring 7 mm, also with suspicious washout kinetics. 3. No evidence of malignancy within the right breast. Post lumpectomy changes within  the right breast.  RECOMMENDATION: MRI-guided biopsies of the 2 suspicious enhancing masses within the left breast, upper inner quadrant and upper outer quadrant.  BI-RADS CATEGORY  4: Suspicious.   Electronically Signed   By: Franki Cabot M.D.   On: 04/22/2017 09:34      PATHOLOGY:  Bone Marrow Biopsy      Flow Cytometry   Lumpectomy     ASSESSMENT and THERAPY PLAN:  High Grade DCIS R breast ER- PR- Negative Sentinel LN Right breast seed localized partial mastectomy with right axillary sentinel lymph node mapping Dr. Brantley Stage 10/13/2015 Adjuvant XRT Anxiety about health MGUS, light chain  1. New left breast masses: Reviewed patient's diagnostic right mammogram and bilateral MRI breast with her today. No palpable masses on breast exam today. Proceed with scheduled biopsy of the 2 left breast masses seen on MRI on 05/01/17. RTC in 1 week to review her path report and to discuss the next plan of care.   2. High grade DCIS of right breast ER/PR neg: Clinically NED.   3, MGUS: Labs pending today. Will review her labs on her next visit.   RTC in 1 week.  This note was electronically  signed. Twana First, MD  04/29/2017

## 2017-04-30 LAB — PROTEIN ELECTROPHORESIS, SERUM
A/G RATIO SPE: 1.2 (ref 0.7–1.7)
ALPHA-2-GLOBULIN: 0.7 g/dL (ref 0.4–1.0)
Albumin ELP: 3.8 g/dL (ref 2.9–4.4)
Alpha-1-Globulin: 0.2 g/dL (ref 0.0–0.4)
Beta Globulin: 1.3 g/dL (ref 0.7–1.3)
GLOBULIN, TOTAL: 3.3 g/dL (ref 2.2–3.9)
Gamma Globulin: 1.1 g/dL (ref 0.4–1.8)
Total Protein ELP: 7.1 g/dL (ref 6.0–8.5)

## 2017-04-30 LAB — IGG, IGA, IGM
IGA: 529 mg/dL — AB (ref 87–352)
IgG (Immunoglobin G), Serum: 1004 mg/dL (ref 700–1600)
IgM, Serum: 79 mg/dL (ref 26–217)

## 2017-04-30 LAB — KAPPA/LAMBDA LIGHT CHAINS
KAPPA FREE LGHT CHN: 17.3 mg/L (ref 3.3–19.4)
Kappa, lambda light chain ratio: 1.14 (ref 0.26–1.65)
LAMDA FREE LIGHT CHAINS: 15.2 mg/L (ref 5.7–26.3)

## 2017-04-30 LAB — BETA 2 MICROGLOBULIN, SERUM: Beta-2 Microglobulin: 1.4 mg/L (ref 0.6–2.4)

## 2017-05-01 ENCOUNTER — Ambulatory Visit
Admission: RE | Admit: 2017-05-01 | Discharge: 2017-05-01 | Disposition: A | Discharge status: Home / Self Care | Payer: BLUE CROSS/BLUE SHIELD | Source: Ambulatory Visit | Attending: Nurse Practitioner | Admitting: Nurse Practitioner

## 2017-05-01 DIAGNOSIS — D242 Benign neoplasm of left breast: Secondary | ICD-10-CM | POA: Diagnosis not present

## 2017-05-01 DIAGNOSIS — N6012 Diffuse cystic mastopathy of left breast: Secondary | ICD-10-CM | POA: Diagnosis not present

## 2017-05-01 DIAGNOSIS — N63 Unspecified lump in unspecified breast: Secondary | ICD-10-CM

## 2017-05-01 DIAGNOSIS — N6322 Unspecified lump in the left breast, upper inner quadrant: Secondary | ICD-10-CM | POA: Diagnosis not present

## 2017-05-01 DIAGNOSIS — N6321 Unspecified lump in the left breast, upper outer quadrant: Secondary | ICD-10-CM | POA: Diagnosis not present

## 2017-05-01 LAB — IMMUNOFIXATION ELECTROPHORESIS
IGM, SERUM: 78 mg/dL (ref 26–217)
IgA: 520 mg/dL — ABNORMAL HIGH (ref 87–352)
IgG (Immunoglobin G), Serum: 982 mg/dL (ref 700–1600)
TOTAL PROTEIN ELP: 7.1 g/dL (ref 6.0–8.5)

## 2017-05-01 MED ORDER — GADOBENATE DIMEGLUMINE 529 MG/ML IV SOLN: 20.0000 mL | Freq: Once | INTRAVENOUS | Status: DC | PRN

## 2017-05-07 ENCOUNTER — Encounter (HOSPITAL_COMMUNITY): Payer: Self-pay | Admitting: Oncology

## 2017-05-07 ENCOUNTER — Ambulatory Visit (HOSPITAL_COMMUNITY): Payer: BLUE CROSS/BLUE SHIELD | Admitting: Oncology

## 2017-05-07 NOTE — Progress Notes (Signed)
-

## 2017-05-07 NOTE — Assessment & Plan Note (Deleted)
DCIS of right breast, high grade, ER/PR NEGATIVE.  Negative sentinel lymph node.  She is S/P right partial mastectomy with right axillary sentinel lymph node mapping by Dr. Brantley Stage on 10/13/2015; followed by adjuvant XRT.  Recent imaging abnormalities in the left breast noted resulting in biopsy of to area.  I personally reviewed and went over pathology results with the patient.  Both biopsies are negative for malignancy and therefore do not require surgical intervention at this time.  She does have follow-up with Dr. Brantley Stage on 05/20/2017.  She will maintain this appointment date and time.  I personally reviewed and went over radiographic studies with the patient.  The results are noted within this dictation.  I personally reviewed the images in PACS.  No role for labs today.  Labs from 04/29/2017 are reviewed with the patient.  I personally reviewed and went over laboratory results with the patient.  The results are noted within this dictation.    Return in 6 months for follow-up.

## 2017-05-07 NOTE — Assessment & Plan Note (Deleted)
MGUS, stable.  Labs from 04/29/2017 reviewed with the patient.  I personally reviewed and went over laboratory results with the patient.  The results are noted within this dictation. MGUS is stable.  Labs in 6 months: CBC diff, CMET, SPEP+IFE, light chain assay.

## 2017-05-20 DIAGNOSIS — N6452 Nipple discharge: Secondary | ICD-10-CM | POA: Diagnosis not present

## 2017-05-20 DIAGNOSIS — Z86000 Personal history of in-situ neoplasm of breast: Secondary | ICD-10-CM | POA: Diagnosis not present

## 2017-06-04 ENCOUNTER — Other Ambulatory Visit (HOSPITAL_COMMUNITY): Payer: Self-pay | Admitting: Nurse Practitioner

## 2017-06-04 DIAGNOSIS — R590 Localized enlarged lymph nodes: Secondary | ICD-10-CM

## 2017-06-04 DIAGNOSIS — N179 Acute kidney failure, unspecified: Secondary | ICD-10-CM | POA: Diagnosis not present

## 2017-06-04 DIAGNOSIS — M797 Fibromyalgia: Secondary | ICD-10-CM | POA: Diagnosis not present

## 2017-06-04 DIAGNOSIS — Z299 Encounter for prophylactic measures, unspecified: Secondary | ICD-10-CM | POA: Diagnosis not present

## 2017-06-04 DIAGNOSIS — E785 Hyperlipidemia, unspecified: Secondary | ICD-10-CM | POA: Diagnosis not present

## 2017-06-04 DIAGNOSIS — I10 Essential (primary) hypertension: Secondary | ICD-10-CM | POA: Diagnosis not present

## 2017-06-07 ENCOUNTER — Ambulatory Visit (HOSPITAL_COMMUNITY)
Admission: RE | Admit: 2017-06-07 | Discharge: 2017-06-07 | Disposition: A | Discharge status: Home / Self Care | Payer: BLUE CROSS/BLUE SHIELD | Source: Ambulatory Visit | Attending: Nurse Practitioner | Admitting: Nurse Practitioner

## 2017-06-07 DIAGNOSIS — R590 Localized enlarged lymph nodes: Secondary | ICD-10-CM | POA: Diagnosis not present

## 2017-06-07 DIAGNOSIS — R221 Localized swelling, mass and lump, neck: Secondary | ICD-10-CM | POA: Diagnosis not present

## 2017-06-12 DIAGNOSIS — H532 Diplopia: Secondary | ICD-10-CM | POA: Diagnosis not present

## 2017-06-12 DIAGNOSIS — R51 Headache: Secondary | ICD-10-CM | POA: Diagnosis not present

## 2017-06-17 DIAGNOSIS — Z0189 Encounter for other specified special examinations: Secondary | ICD-10-CM | POA: Diagnosis not present

## 2017-06-17 DIAGNOSIS — S62031D Displaced fracture of proximal third of navicular [scaphoid] bone of right wrist, subsequent encounter for fracture with routine healing: Secondary | ICD-10-CM | POA: Diagnosis not present

## 2017-06-17 DIAGNOSIS — S62031S Displaced fracture of proximal third of navicular [scaphoid] bone of right wrist, sequela: Secondary | ICD-10-CM | POA: Diagnosis not present

## 2017-06-17 DIAGNOSIS — R200 Anesthesia of skin: Secondary | ICD-10-CM | POA: Diagnosis not present

## 2017-06-17 DIAGNOSIS — M25531 Pain in right wrist: Secondary | ICD-10-CM | POA: Diagnosis not present

## 2017-06-17 DIAGNOSIS — R202 Paresthesia of skin: Secondary | ICD-10-CM | POA: Diagnosis not present

## 2017-06-17 DIAGNOSIS — S62031A Displaced fracture of proximal third of navicular [scaphoid] bone of right wrist, initial encounter for closed fracture: Secondary | ICD-10-CM | POA: Diagnosis not present

## 2017-06-19 DIAGNOSIS — G5621 Lesion of ulnar nerve, right upper limb: Secondary | ICD-10-CM | POA: Diagnosis not present

## 2017-06-19 DIAGNOSIS — G5601 Carpal tunnel syndrome, right upper limb: Secondary | ICD-10-CM | POA: Diagnosis not present

## 2017-06-20 DIAGNOSIS — G5621 Lesion of ulnar nerve, right upper limb: Secondary | ICD-10-CM | POA: Insufficient documentation

## 2017-06-20 DIAGNOSIS — G5601 Carpal tunnel syndrome, right upper limb: Secondary | ICD-10-CM | POA: Insufficient documentation

## 2017-06-27 DIAGNOSIS — Z6836 Body mass index (BMI) 36.0-36.9, adult: Secondary | ICD-10-CM | POA: Diagnosis not present

## 2017-06-27 DIAGNOSIS — L239 Allergic contact dermatitis, unspecified cause: Secondary | ICD-10-CM | POA: Diagnosis not present

## 2017-06-27 DIAGNOSIS — I10 Essential (primary) hypertension: Secondary | ICD-10-CM | POA: Diagnosis not present

## 2017-06-27 DIAGNOSIS — Z713 Dietary counseling and surveillance: Secondary | ICD-10-CM | POA: Diagnosis not present

## 2017-06-27 DIAGNOSIS — Z299 Encounter for prophylactic measures, unspecified: Secondary | ICD-10-CM | POA: Diagnosis not present

## 2017-07-02 DIAGNOSIS — M545 Low back pain: Secondary | ICD-10-CM | POA: Diagnosis not present

## 2017-07-02 DIAGNOSIS — M5416 Radiculopathy, lumbar region: Secondary | ICD-10-CM | POA: Diagnosis not present

## 2017-07-02 DIAGNOSIS — Z299 Encounter for prophylactic measures, unspecified: Secondary | ICD-10-CM | POA: Diagnosis not present

## 2017-07-02 DIAGNOSIS — I10 Essential (primary) hypertension: Secondary | ICD-10-CM | POA: Diagnosis not present

## 2017-07-02 DIAGNOSIS — Z6836 Body mass index (BMI) 36.0-36.9, adult: Secondary | ICD-10-CM | POA: Diagnosis not present

## 2017-07-24 DIAGNOSIS — M19031 Primary osteoarthritis, right wrist: Secondary | ICD-10-CM | POA: Diagnosis not present

## 2017-07-24 DIAGNOSIS — G5601 Carpal tunnel syndrome, right upper limb: Secondary | ICD-10-CM | POA: Diagnosis not present

## 2017-07-24 DIAGNOSIS — G5621 Lesion of ulnar nerve, right upper limb: Secondary | ICD-10-CM | POA: Diagnosis not present

## 2017-08-12 DIAGNOSIS — E785 Hyperlipidemia, unspecified: Secondary | ICD-10-CM | POA: Diagnosis not present

## 2017-08-12 DIAGNOSIS — K219 Gastro-esophageal reflux disease without esophagitis: Secondary | ICD-10-CM | POA: Diagnosis not present

## 2017-08-12 DIAGNOSIS — Z0000 Encounter for general adult medical examination without abnormal findings: Secondary | ICD-10-CM | POA: Diagnosis not present

## 2017-08-12 DIAGNOSIS — H532 Diplopia: Secondary | ICD-10-CM | POA: Diagnosis not present

## 2017-08-20 DIAGNOSIS — H5010 Unspecified exotropia: Secondary | ICD-10-CM | POA: Diagnosis not present

## 2017-08-20 DIAGNOSIS — H5203 Hypermetropia, bilateral: Secondary | ICD-10-CM | POA: Diagnosis not present

## 2017-08-20 DIAGNOSIS — G7000 Myasthenia gravis without (acute) exacerbation: Secondary | ICD-10-CM | POA: Diagnosis not present

## 2017-08-30 DIAGNOSIS — Z0000 Encounter for general adult medical examination without abnormal findings: Secondary | ICD-10-CM | POA: Diagnosis not present

## 2017-08-30 DIAGNOSIS — E559 Vitamin D deficiency, unspecified: Secondary | ICD-10-CM | POA: Diagnosis not present

## 2017-08-30 DIAGNOSIS — C801 Malignant (primary) neoplasm, unspecified: Secondary | ICD-10-CM | POA: Diagnosis not present

## 2017-08-30 DIAGNOSIS — E785 Hyperlipidemia, unspecified: Secondary | ICD-10-CM | POA: Diagnosis not present

## 2017-08-30 DIAGNOSIS — I671 Cerebral aneurysm, nonruptured: Secondary | ICD-10-CM | POA: Diagnosis not present

## 2017-09-17 DIAGNOSIS — H5010 Unspecified exotropia: Secondary | ICD-10-CM | POA: Diagnosis not present

## 2017-10-29 ENCOUNTER — Ambulatory Visit: Payer: BLUE CROSS/BLUE SHIELD | Admitting: Neurology

## 2017-10-29 ENCOUNTER — Encounter: Payer: Self-pay | Admitting: Neurology

## 2017-10-29 DIAGNOSIS — R42 Dizziness and giddiness: Secondary | ICD-10-CM | POA: Diagnosis not present

## 2017-10-29 DIAGNOSIS — R269 Unspecified abnormalities of gait and mobility: Secondary | ICD-10-CM | POA: Insufficient documentation

## 2017-10-29 DIAGNOSIS — H814 Vertigo of central origin: Secondary | ICD-10-CM

## 2017-10-29 DIAGNOSIS — G250 Essential tremor: Secondary | ICD-10-CM | POA: Diagnosis not present

## 2017-10-29 DIAGNOSIS — H532 Diplopia: Secondary | ICD-10-CM | POA: Diagnosis not present

## 2017-10-29 DIAGNOSIS — H8149 Vertigo of central origin, unspecified ear: Secondary | ICD-10-CM

## 2017-10-29 HISTORY — DX: Diplopia: H53.2

## 2017-10-29 HISTORY — DX: Essential tremor: G25.0

## 2017-10-29 HISTORY — DX: Unspecified abnormalities of gait and mobility: R26.9

## 2017-10-29 HISTORY — DX: Dizziness and giddiness: R42

## 2017-10-29 NOTE — Patient Instructions (Signed)
   We will get blood work today and repeat MRI of the brain.

## 2017-10-29 NOTE — Progress Notes (Signed)
Reason for visit: Dizziness, gait instability, tremor  Referring physician: Dr. Araceli Bouche is a 52 y.o. female  History of present illness:  [name] is a 52 year old right-handed white female with a history of an essential tremor, seen and evaluated previously by Dr. Carles Collet.  The patient has a history of breast cancer on the right, she received radiation therapy.  Preceding the diagnosis of her breast cancer, the patient developed some tremors affecting both arms and a head nod tremor.  The patient's mother also has a tremor.  The patient was felt to have an essential tremor but she did not respond satisfactorily to gabapentin or with Mysoline.  The patient has developed some problems with gait instability as far back as October 2015.  The patient has fallen on occasion.  She continues to have slightly worsening problems with tremors and with gait instability and falling.  She reports some numbness in the feet, nerve conduction study done previously in April 2016 did not show evidence of a peripheral neuropathy.  The patient has had MRI of the brain last done about 2 years ago that was essentially normal.  She has been found to have a 2 mm anterior communicating artery aneurysm, this has been followed over time.  The patient has been noted to have a monoclonal antibody with IgA.  She has a history of migraine headaches, fibromyalgia, sleep apnea on CPAP, and irritable bowel syndrome.  She has indicated that her migraine headaches have become less frequent over time.  She may occasionally have sharp jabbing pains in the head.  She has had onset of double vision over the last 2 and half months, she now has prisms in her glasses to help correct this.  She has a history of bilateral carpal tunnel syndrome and right ulnar neuropathy requiring surgery.  She is sent to this office for further evaluation.  The sensation of dizziness is present with standing, it tends to go away with sitting or  lying down.  She denies true vertigo, the sensation is more of a sensation of imbalance.  Past Medical History:  Diagnosis Date  . Allergy    seasonal  . Anxiety   . Arthritis   . Balance problem   . Brain aneurysm    2 mm ACA region aneurysm by 09/21/15 MRA  . Breast cancer ([place])   . Breast cancer of lower-inner quadrant of right female breast ([place]) 08/26/2015  . Chronic kidney disease    acute renal failure one occasion  . Complication of anesthesia    heart rate drops and O2 sats drop  . Constipation   . Depression   . Diabetes mellitus without complication ([place])    not on medication at this time  . Elevated liver function tests   . Family history of adverse reaction to anesthesia    mom has n/v  . Family history of breast cancer   . Family history of colon cancer   . Fibromyalgia   . Fibromyalgia   . Genetic testing 09/08/2015   Negative genetic testing on the Breast/High Moderate Risk panel. The Breast High/Moderate Risk gene panel offered by [facility] includes sequencing and deletion/duplication analysis of the following 9 genes: ATM, BRCA1, BRCA2, CDH1, CHEK2, PALB2, PTEN, STK11, and TP53. The report date is September 08, 2015.  The Rest of the Comprehensive Cancer panel has been reflexed and will be available in 2-3 weeks.  POLD1 c.208G>T VUS found on the Remainder of the Comprehensive  cancer panel.  The Comprehensive Cancer Panel offered by [facility] includes sequencing and/or deletion duplication testing of the following 32 genes: APC, ATM, AXIN2, BARD1, BMPR1A, BRCA1, BRCA2, BRIP1, CDH1, CDK4, CDKN2A, CHEK2, EPCAM, FANCC, MLH1, MSH2, MSH6, MUTYH, NBN, PALB2, PMS2, POLD1, POLE, PTEN, RAD51C, RAD51D, SCG5/GREM1, SMAD4, STK11, TP53, VHL, and XRCC2.   The report date is 09/12/2015.   Marland Kitchen GERD (gastroesophageal reflux disease)    none recent  . H/O acute renal failure 09/22/15   admitted to [facility]  . Headache    occipital and temporal  . History of hiatal hernia   .  Hyperlipemia   . Hyperlipidemia   . Hypertension   . Irritable bowel syndrome (IBS)   . MGUS (monoclonal gammopathy of unknown significance) 03/15/2016  . Neuromuscular disorder (HCC)    fibromyalgia  . Occasional tremors   . OSA (obstructive sleep apnea)    uses cpap with humidification  . PONV (postoperative nausea and vomiting)     Past Surgical History:  Procedure Laterality Date  . ABDOMINAL HYSTERECTOMY     adenomyosis  . BLADDER SURGERY    . BONE GRAFT HIP ILIAC CREST    . BREAST SURGERY    . CHOLECYSTECTOMY    . FRACTURE SURGERY Right    wrist  . KNEE ARTHROSCOPY W/ ACL RECONSTRUCTION     right  . RADIOACTIVE SEED GUIDED PARTIAL MASTECTOMY WITH AXILLARY SENTINEL LYMPH NODE BIOPSY Right 10/13/2015   Procedure: RADIOACTIVE SEED GUIDED PARTIAL MASTECTOMY WITH AXILLARY SENTINEL LYMPH NODE BIOPSY;  Surgeon: Erroll Luna, MD;  Location: [facility];  Service: General;  Laterality: Right;  . WRIST SURGERY      Family History  Problem Relation Age of Onset  . Hypertension Mother   . Autoimmune disease Mother        lichen planus  . Eczema Mother   . Parkinson's disease Mother   . Mental illness Mother        bipolar  . Heart disease Maternal Aunt   . Lung cancer Paternal Aunt   . Breast cancer Paternal Aunt        dx in her 67s  . Lung cancer Paternal Grandfather   . Colon cancer Paternal Uncle        dx in her 22s  . Colon cancer Paternal Uncle        dx in his 62s  . Melanoma Father 31  . Psoriasis Father   . Hypertension Father   . Heart disease Father   . Breast cancer Sister        dx in her 6s  . Lupus Sister   . COPD Maternal Uncle   . Colon cancer Paternal Uncle        dx in his 56s  . Mental illness Son        bipolar    Social history:  reports that  has never smoked. she has never used smokeless tobacco. She reports that she does not drink alcohol or use drugs.  Medications:  Prior to Admission medications   Medication Sig Start Date End Date  Taking? Authorizing Provider  carvedilol (COREG) 12.5 MG tablet Take 12.5 mg by mouth 2 (two) times daily with a meal.   Yes [provider]  chlorthalidone (HYGROTON) 25 MG tablet Take 25 mg by mouth daily.   Yes [provider]  clonazePAM (KLONOPIN) 1 MG tablet Take 1 mg by mouth 3 (three) times daily as needed.  10/31/16  Yes [provider]  diltiazem (CARDIZEM) 120 MG tablet Take 120 mg by mouth 4 (four) times daily.   Yes [provider]  ezetimibe (ZETIA) 10 MG tablet Take 10 mg by mouth daily.   Yes [provider]  FLUoxetine (PROZAC) 40 MG capsule Take 60 mg by mouth daily.   Yes [provider]  ibuprofen (ADVIL,MOTRIN) 800 MG tablet Take 800 mg by mouth every 8 (eight) hours as needed.   Yes [provider]  pantoprazole (PROTONIX) 40 MG tablet Take 40 mg by mouth 2 (two) times daily.  10/16/16  Yes [provider]  Vitamin D, Ergocalciferol, (DRISDOL) 50000 units CAPS capsule Take 50,000 Units by mouth every 7 (seven) days.   Yes [provider]      Allergies  Allergen Reactions  . Ramipril Nausea And Vomiting and Other (See Comments)    Ramipril--Caused Pancreatitis  . Shrimp [Shellfish Allergy] Anaphylaxis and Swelling  . Minocycline Hcl Hives  . Adhesive [Tape] Rash    Please use paper tape  . Bactrim [Sulfamethoxazole-Trimethoprim] Other (See Comments)    May have caused kidney issues  . Drixoral [Brompheniramine-Pseudoeph] Rash  . Erythromycin Other (See Comments)    GI- Upset / severe abdominal pain  . Lyrica [Pregabalin] Anxiety    Dizziness and worsening depression  . Sinutab [Chlorphen-Pseudoephed-Apap] Rash    ROS:  Out of a complete 14 system review of symptoms, the patient complains only of the following symptoms, and all other reviewed systems are negative.  Weight gain, fatigue Chest pain, palpitations of the heart, swelling in the legs Hearing loss, ringing in the  ears Birthmarks, itching, moles Blurred vision, double vision Diarrhea Urination problems Easy bruising, lymph node swelling Increased thirst, flushing Joint pain, joint swelling, aching muscles Skin sensitivity Headache, numbness, weakness, dizziness, tremor Depression, anxiety, decreased energy Insomnia, sleepiness, restless legs  Blood pressure 117/77, pulse 70, height 5' 8.25" (1.734 m), weight 247 lb 8 oz (112.3 kg).   Blood pressure, left arm, sitting is 108/68.  Blood pressure, left arm standing is 114/70.  Physical Exam  General: The patient is alert and cooperative at the time of the examination.  The patient is moderately to markedly obese.  Eyes: Pupils are equal, round, and reactive to light. Discs are flat bilaterally.  Neck: The neck is supple, no carotid bruits are noted.  Respiratory: The respiratory examination is clear.  Cardiovascular: The cardiovascular examination reveals a regular rate and rhythm, no obvious murmurs or rubs are noted.  Skin: Extremities are without significant edema.  Neurologic Exam  Mental status: The patient is alert and oriented x 3 at the time of the examination. The patient has apparent normal recent and remote memory, with an apparently normal attention span and concentration ability.  Cranial nerves: Facial symmetry is present. There is good sensation of the face to pinprick and soft touch bilaterally. The strength of the facial muscles and the muscles to head turning and shoulder shrug are normal bilaterally. Speech is well enunciated, no aphasia or dysarthria is noted. Extraocular movements are full, but with superior gaze there is slight exotropia of the left eye. Visual fields are full. The tongue is midline, and the patient has symmetric elevation of the soft palate. No obvious hearing deficits are noted.  Motor: The motor testing reveals 5 over 5 strength of all 4 extremities. Good symmetric motor tone is noted  throughout.  Sensory: Sensory testing is intact to pinprick, soft touch, vibration sensation, and position sense on all 4 extremities, with  exception of a stocking pattern pinprick sensory deficit in the distal two thirds of the lower extremities. No evidence of extinction is noted.  Coordination: Cerebellar testing reveals good finger-nose-finger and heel-to-shin bilaterally.  A fine action tremor seen with finger-nose-finger bilaterally.  Gait and station: Gait is normal. Tandem gait is normal. Romberg is negative. No drift is seen.  Reflexes: Deep tendon reflexes are symmetric and normal bilaterally.  Ankle jerk reflexes are well-maintained bilaterally.  Toes are downgoing bilaterally.   MRI brain 11/16/15:  IMPRESSION: 1.  No acute or metastatic intracranial abnormality. 2. Tiny anterior communicating artery aneurysm described in November is only evident by MRA.  * MRI scan images were reviewed online. I agree with the written report.    Assessment/Plan:  1.  Chronic dizziness, gait instability  2.  Essential tremor  3.  Double vision  4.  History of migraine headache  The patient has had a chronic history of dizziness and gait instability dating back to 2015.  This has gradually gotten a bit worse as time has gone on.  The etiology of this is not clear, the patient does not have evidence of a peripheral neuropathy on clinical examination even though she reports numbness in the feet.  Prior nerve conduction study has been unremarkable.  The patient will be sent back for MRI evaluation of the brain, it has been 2 years since the last study.  Blood work will be done today.  She will follow-up in 4 or 5 months.  MRI of the cervical spine may be done in the future as well given the chronic, slightly progressive symptoms.  Jill Alexanders MD 10/29/2017 2:04 PM  [facility] [address] [place], [place] [address]  Phone [phone] Fax  [phone]

## 2017-11-01 ENCOUNTER — Ambulatory Visit (HOSPITAL_COMMUNITY)
Admission: RE | Admit: 2017-11-01 | Discharge: 2017-11-01 | Disposition: A | Discharge status: Home / Self Care | Payer: BLUE CROSS/BLUE SHIELD | Source: Ambulatory Visit | Attending: Neurology | Admitting: Neurology

## 2017-11-01 DIAGNOSIS — G250 Essential tremor: Secondary | ICD-10-CM | POA: Insufficient documentation

## 2017-11-01 DIAGNOSIS — H8149 Vertigo of central origin, unspecified ear: Secondary | ICD-10-CM | POA: Insufficient documentation

## 2017-11-01 DIAGNOSIS — Q283 Other malformations of cerebral vessels: Secondary | ICD-10-CM | POA: Diagnosis not present

## 2017-11-01 DIAGNOSIS — H814 Vertigo of central origin: Secondary | ICD-10-CM

## 2017-11-01 DIAGNOSIS — R269 Unspecified abnormalities of gait and mobility: Secondary | ICD-10-CM | POA: Insufficient documentation

## 2017-11-01 DIAGNOSIS — R51 Headache: Secondary | ICD-10-CM | POA: Diagnosis not present

## 2017-11-01 DIAGNOSIS — H532 Diplopia: Secondary | ICD-10-CM | POA: Diagnosis not present

## 2017-11-01 DIAGNOSIS — R42 Dizziness and giddiness: Secondary | ICD-10-CM

## 2017-11-01 LAB — PARANEOPLASTIC PROFILE 1
Neuronal Nuclear (Hu) Antibody (IB): <1:10 {titer}
Purkinje Cell (Yo) Autoantobodies- IFA: <1:10 {titer}

## 2017-11-01 LAB — GLIADIN ANTIBODIES, SERUM
ANTIGLIADIN ABS, IGA: 21 U — AB (ref 0–19)
GLIADIN IGG: 3 U (ref 0–19)

## 2017-11-01 LAB — VITAMIN B12: Vitamin B-12: 366 pg/mL (ref 232–1245)

## 2017-11-01 LAB — B. BURGDORFI ANTIBODIES: Lyme IgG/IgM Ab: <0.91 {ISR} (ref 0.00–0.90)

## 2017-11-01 LAB — ANA W/REFLEX: ANA: NEGATIVE

## 2017-11-01 LAB — RPR: RPR: NONREACTIVE

## 2017-11-01 LAB — ACETYLCHOLINE RECEPTOR, BINDING: AChR Binding Ab, Serum: <0.03 nmol/L (ref 0.00–0.24)

## 2017-11-01 LAB — COPPER, SERUM: Copper: 104 ug/dL (ref 72–166)

## 2017-11-01 LAB — SEDIMENTATION RATE: Sed Rate: 26 mm/hr (ref 0–40)

## 2017-11-01 LAB — POCT I-STAT CREATININE: CREATININE: 1 mg/dL (ref 0.44–1.00)

## 2017-11-01 LAB — HIV ANTIBODY (ROUTINE TESTING W REFLEX): HIV Screen 4th Generation wRfx: NONREACTIVE

## 2017-11-01 MED ORDER — GADOBENATE DIMEGLUMINE 529 MG/ML IV SOLN
20.0000 mL | Freq: Once | INTRAVENOUS | Status: AC | PRN
  Administered 2017-11-01: 20 mL via INTRAVENOUS

## 2017-11-03 ENCOUNTER — Telehealth: Payer: Self-pay | Admitting: Neurology

## 2017-11-03 DIAGNOSIS — R269 Unspecified abnormalities of gait and mobility: Secondary | ICD-10-CM

## 2017-11-03 NOTE — Telephone Encounter (Signed)
I tried to call the patient, unable to leave a message. The MRI of the brain is unremarkable, blood work does not reveal the cause of the dizziness, gait instability, tremor or double vision.    Blood work also was relatively unremarkable.     MRI brain 11/02/17:  IMPRESSION: 1. No acute intracranial process. 2. LEFT frontal developmental venous anomaly; otherwise negative MRI of the head with and without contrast.

## 2017-11-04 NOTE — Telephone Encounter (Signed)
I called the patient.  I discussed the blood work results and MRI results.  No etiology of her walking problem is noted, no cause for tremor or dizziness or double vision.  The tremor likely is an essential tremor.  She has had a CT scan of the cervical spine done in 2016 that was relatively unremarkable, a disc bulge at C5-6 level was noted without severe spinal stenosis.  I have suggested physical therapy for balance training, the patient will contact me if she wishes to pursue this.  Blood work again was relatively unremarkable.

## 2017-11-21 DIAGNOSIS — H5010 Unspecified exotropia: Secondary | ICD-10-CM | POA: Diagnosis not present

## 2017-11-27 DIAGNOSIS — I10 Essential (primary) hypertension: Secondary | ICD-10-CM | POA: Diagnosis not present

## 2017-11-29 DIAGNOSIS — I671 Cerebral aneurysm, nonruptured: Secondary | ICD-10-CM | POA: Diagnosis not present

## 2017-12-10 ENCOUNTER — Other Ambulatory Visit (HOSPITAL_COMMUNITY): Payer: Self-pay | Admitting: Specialist

## 2017-12-10 DIAGNOSIS — R1319 Other dysphagia: Secondary | ICD-10-CM

## 2017-12-12 ENCOUNTER — Ambulatory Visit (HOSPITAL_COMMUNITY)
Admission: RE | Admit: 2017-12-12 | Discharge: 2017-12-12 | Disposition: A | Discharge status: Home / Self Care | Payer: BLUE CROSS/BLUE SHIELD | Source: Ambulatory Visit | Attending: Adult Health Nurse Practitioner | Admitting: Adult Health Nurse Practitioner

## 2017-12-12 ENCOUNTER — Ambulatory Visit (HOSPITAL_COMMUNITY): Payer: BLUE CROSS/BLUE SHIELD | Attending: Adult Health Nurse Practitioner | Admitting: Speech Pathology

## 2017-12-12 ENCOUNTER — Other Ambulatory Visit: Payer: Self-pay

## 2017-12-12 ENCOUNTER — Encounter (HOSPITAL_COMMUNITY): Payer: Self-pay | Admitting: Speech Pathology

## 2017-12-12 DIAGNOSIS — M797 Fibromyalgia: Secondary | ICD-10-CM | POA: Diagnosis not present

## 2017-12-12 DIAGNOSIS — Z9049 Acquired absence of other specified parts of digestive tract: Secondary | ICD-10-CM | POA: Diagnosis not present

## 2017-12-12 DIAGNOSIS — G5621 Lesion of ulnar nerve, right upper limb: Secondary | ICD-10-CM | POA: Diagnosis not present

## 2017-12-12 DIAGNOSIS — R269 Unspecified abnormalities of gait and mobility: Secondary | ICD-10-CM | POA: Diagnosis not present

## 2017-12-12 DIAGNOSIS — K589 Irritable bowel syndrome without diarrhea: Secondary | ICD-10-CM | POA: Insufficient documentation

## 2017-12-12 DIAGNOSIS — Z6835 Body mass index (BMI) 35.0-35.9, adult: Secondary | ICD-10-CM | POA: Diagnosis not present

## 2017-12-12 DIAGNOSIS — K219 Gastro-esophageal reflux disease without esophagitis: Secondary | ICD-10-CM | POA: Diagnosis not present

## 2017-12-12 DIAGNOSIS — N189 Chronic kidney disease, unspecified: Secondary | ICD-10-CM | POA: Diagnosis not present

## 2017-12-12 DIAGNOSIS — R1319 Other dysphagia: Secondary | ICD-10-CM

## 2017-12-12 DIAGNOSIS — R5383 Other fatigue: Secondary | ICD-10-CM | POA: Diagnosis not present

## 2017-12-12 DIAGNOSIS — F419 Anxiety disorder, unspecified: Secondary | ICD-10-CM | POA: Insufficient documentation

## 2017-12-12 DIAGNOSIS — I129 Hypertensive chronic kidney disease with stage 1 through stage 4 chronic kidney disease, or unspecified chronic kidney disease: Secondary | ICD-10-CM | POA: Insufficient documentation

## 2017-12-12 DIAGNOSIS — E1122 Type 2 diabetes mellitus with diabetic chronic kidney disease: Secondary | ICD-10-CM | POA: Insufficient documentation

## 2017-12-12 DIAGNOSIS — R1312 Dysphagia, oropharyngeal phase: Secondary | ICD-10-CM | POA: Insufficient documentation

## 2017-12-12 DIAGNOSIS — G4733 Obstructive sleep apnea (adult) (pediatric): Secondary | ICD-10-CM | POA: Diagnosis not present

## 2017-12-12 DIAGNOSIS — R05 Cough: Secondary | ICD-10-CM | POA: Diagnosis not present

## 2017-12-12 DIAGNOSIS — R7301 Impaired fasting glucose: Secondary | ICD-10-CM | POA: Diagnosis not present

## 2017-12-12 DIAGNOSIS — E669 Obesity, unspecified: Secondary | ICD-10-CM | POA: Diagnosis not present

## 2017-12-12 DIAGNOSIS — F329 Major depressive disorder, single episode, unspecified: Secondary | ICD-10-CM | POA: Insufficient documentation

## 2017-12-12 DIAGNOSIS — G5603 Carpal tunnel syndrome, bilateral upper limbs: Secondary | ICD-10-CM | POA: Insufficient documentation

## 2017-12-12 DIAGNOSIS — I671 Cerebral aneurysm, nonruptured: Secondary | ICD-10-CM | POA: Diagnosis not present

## 2017-12-12 NOTE — Therapy (Signed)
[facility] [place], [place], [address] Phone: [phone]   Fax:  [phone]  Modified Barium Swallow  Patient Details  Name: Sarah Cooley MRN: [id number] Date of Birth: [birth year] No Data Recorded  Encounter Date: 12/12/2017  End of Session - 12/12/17 1800    Visit Number  1    Number of Visits  1    Authorization Type  BCBS    SLP Start Time  9323    SLP Stop Time   1415    SLP Time Calculation (min)  33 min    Activity Tolerance  Patient tolerated treatment well       Past Medical History:  Diagnosis Date  . Allergy    seasonal  . Anxiety   . Arthritis   . Balance problem   . Brain aneurysm    2 mm ACA region aneurysm by 09/21/15 MRA  . Breast cancer ([place])   . Breast cancer of lower-inner quadrant of right female breast ([place]) 08/26/2015  . Chronic kidney disease    acute renal failure one occasion  . Complication of anesthesia    heart rate drops and O2 sats drop  . Constipation   . Depression   . Diabetes mellitus without complication ([place])    not on medication at this time  . Diplopia 10/29/2017  . Dizziness 10/29/2017  . Elevated liver function tests   . Family history of adverse reaction to anesthesia    mom has n/v  . Family history of breast cancer   . Family history of colon cancer   . Fibromyalgia   . Fibromyalgia   . Gait abnormality 10/29/2017  . Genetic testing 09/08/2015   Negative genetic testing on the Breast/High Moderate Risk panel. The Breast High/Moderate Risk gene panel offered by [facility] includes sequencing and deletion/duplication analysis of the following 9 genes: ATM, BRCA1, BRCA2, CDH1, CHEK2, PALB2, PTEN, STK11, and TP53. The report date is September 08, 2015.  The Rest of the Comprehensive Cancer panel has been reflexed and will be available in 2-3 weeks.  POLD1 c.208G>T VUS found on the Remainder of the Comprehensive cancer panel.  The Comprehensive Cancer Panel offered by [facility]  includes sequencing and/or deletion duplication testing of the following 32 genes: APC, ATM, AXIN2, BARD1, BMPR1A, BRCA1, BRCA2, BRIP1, CDH1, CDK4, CDKN2A, CHEK2, EPCAM, FANCC, MLH1, MSH2, MSH6, MUTYH, NBN, PALB2, PMS2, POLD1, POLE, PTEN, RAD51C, RAD51D, SCG5/GREM1, SMAD4, STK11, TP53, VHL, and XRCC2.   The report date is 09/12/2015.   Marland Kitchen GERD (gastroesophageal reflux disease)    none recent  . H/O acute renal failure 09/22/15   admitted to [facility]  . Headache    occipital and temporal  . History of hiatal hernia   . Hyperlipemia   . Hyperlipidemia   . Hypertension   . Irritable bowel syndrome (IBS)   . MGUS (monoclonal gammopathy of unknown significance) 03/15/2016  . Neuromuscular disorder (HCC)    fibromyalgia  . Occasional tremors   . OSA (obstructive sleep apnea)    uses cpap with humidification  . PONV (postoperative nausea and vomiting)   . Tremor, essential 10/29/2017    Past Surgical History:  Procedure Laterality Date  . ABDOMINAL HYSTERECTOMY     adenomyosis  . BLADDER SURGERY    . BONE GRAFT HIP ILIAC CREST    . BREAST SURGERY    . CHOLECYSTECTOMY    . FRACTURE SURGERY Right    wrist  . KNEE ARTHROSCOPY W/  ACL RECONSTRUCTION     right  . RADIOACTIVE SEED GUIDED PARTIAL MASTECTOMY WITH AXILLARY SENTINEL LYMPH NODE BIOPSY Right 10/13/2015   Procedure: RADIOACTIVE SEED GUIDED PARTIAL MASTECTOMY WITH AXILLARY SENTINEL LYMPH NODE BIOPSY;  Surgeon: Erroll Luna, MD;  Location: [facility];  Service: General;  Laterality: Right;  . WRIST SURGERY      There were no vitals filed for this visit.  Subjective Assessment - 12/12/17 1753    Subjective  "I aspirate frequently."    Special Tests  MBSS    Currently in Pain?  No/denies    Multiple Pain Sites  No          General - 12/12/17 1755      General Information   Date of Onset  11/22/17    HPI  [name] a 52 year old right-handed white female who was referred for MBSS Sarah Cooley due to Pt with  reports of "aspiration on the right side". Pt with a past medical history of an essential tremor, breast cancer on the right, she received radiation therapy. Preceding the diagnosis of her breast cancer, the patient developed some tremors affecting both arms and a head nod tremor. The patient has developed some problems with gait instability as far back as October 2015. The patient has fallen on occasion.She reports some numbness in the feet, nerve conduction study done previously in April 2016 did not show evidence of a peripheral neuropathy. The patient has had MRI of the brain that was essentially normal. She has been found to have a 2 mm anterior communicating artery aneurysm, this has been followed over time. The patient has been noted to have a monoclonal antibody with IgA. She has a history of migraine headaches, fibromyalgia, sleep apnea on CPAP, and irritable bowel syndrome. She has had onset of double vision over the last 2 and half months, she now has prisms in her glasses to help correct this. She has a history of bilateral carpal tunnel syndrome and right ulnar neuropathy requiring surgery.     Type of Study  MBS-Modified Barium Swallow Study    Previous Swallow Assessment  None on record    Diet Prior to this Study  Regular;Thin liquids    Temperature Spikes Noted  No    Respiratory Status  Room air    History of Recent Intubation  No    Behavior/Cognition  Alert;Cooperative;Pleasant mood    Oral Cavity Assessment  Within Functional Limits    Oral Care Completed by SLP  No    Oral Cavity - Dentition  Adequate natural dentition    Vision  Functional for self feeding    Self-Feeding Abilities  Able to feed self    Patient Positioning  Upright in chair    Baseline Vocal Quality  Normal    Volitional Cough  Strong    Volitional Swallow  Able to elicit    Anatomy  Within functional limits    Pharyngeal Secretions  Not observed secondary MBS         Oral Preparation/Oral Phase  - 12/12/17 1755      Oral Preparation/Oral Phase   Oral Phase  Within functional limits      Electrical stimulation - Oral Phase   Was Electrical Stimulation Used  No       Pharyngeal Phase - 12/12/17 1755      Pharyngeal Phase   Pharyngeal Phase  Impaired      Pharyngeal - Thin   Pharyngeal- Thin Teaspoon  Delayed swallow initiation;Swallow initiation  at pyriform sinus    Pharyngeal- Thin Cup  Delayed swallow initiation;Swallow initiation at pyriform sinus    Pharyngeal- Thin Straw  Delayed swallow initiation;Swallow initiation at pyriform sinus      Pharyngeal - Solids   Pharyngeal- Puree  Within functional limits;Pharyngeal residue - cp segment trace residuals near prominent CP    Pharyngeal- Regular  Within functional limits    Pharyngeal- Pill  Within functional limits premature spillage of liquids with pill      Pharyngeal Phase - Comment   Pharyngeal Comment  Pharyngeal phase noteable for slight delay in swallow initiation and prominent cricpharyngeus      Electrical Stimulation - Pharyngeal Phase   Was Electrical Stimulation Used  No       Cricopharyngeal Phase - 12/12/17 1758      Cervical Esophageal Phase   Cervical Esophageal Phase  Impaired      Cervical Esophageal Phase - Solids   Puree  Prominent cricopharyngeal segment      Cervical Esophageal Phase - Comment   Cervical Esophageal Comment  trace residuals from tail end of puree bolus near CP    Other Esophageal Phase Observations  Esophageal sweep revealed delayed emptying of puree in distal esophagus; brief stasis of barium tablet near GEJ which cleared after a bite of puree               Plan - 12/12/17 1801    Clinical Impression Statement  Oropharyngeal swallow essentially WNL; Pt with mild delay in swallow initiation with swallow trigger after liquids fill the pyriforms. No penetration, aspiration, or significant pharyngeal residue noted. Pt was noted to have a prominent cricopharyngeus  which is often seen in individuals with a history of GERD. Pt with trace residue noted near UES (tail end of puree bolus). Pt with one coughing episode when taking straw sips of thin liquids, however no penetration or aspiration observed. Esophageal sweep revealed likely delayed empyting of puree bolus and brief stasis of barium tablet near the GEJ which eventually cleared with a bite of puree. This study was reviewed with the patient. Pt noted to clear her throat frequently after the study and her symptoms may correlate with reflux. She was encouraged to keep a food journal to note when/what foods/liquids caused couging episodes. She may benefit from GI and/or ENT consult to evaluate for reflux and/or dysmotility. Pt further encouraged to elevate the head of her bed at night as part of reflux precautions to see if this alleviates symptoms. No further SLP services indicated at this time. She was reminded to notify her doctor if symptoms worsen.     Treatment/Interventions  Aspiration precaution training    Consulted and Agree with Plan of Care  Patient       Patient will benefit from skilled therapeutic intervention in order to improve the following deficits and impairments:   Dysphagia, oropharyngeal phase    Recommendations/Treatment - 12/12/17 1759      Swallow Evaluation Recommendations   Recommended Consults  Consider GI evaluation;Consider ENT evaluation    SLP Diet Recommendations  Age appropriate regular;Thin    Liquid Administration via  Cup;Straw    Medication Administration  Whole meds with liquid    Supervision  Patient able to self feed    Postural Changes  Seated upright at 90 degrees;Remain upright for at least 30 minutes after feeds/meals         Problem List Patient Active Problem List   Diagnosis Date Noted  . Dizziness 10/29/2017  .  Gait abnormality 10/29/2017  . Tremor, essential 10/29/2017  . Diplopia 10/29/2017  . Obstructive sleep apnea on CPAP 12/10/2016  .  Plantar fasciitis, bilateral 12/10/2016  . Fibromyalgia 11/09/2016  . IBS (irritable bowel syndrome) 11/09/2016  . GERD (gastroesophageal reflux disease) 11/09/2016  . Anxiety and depression 11/09/2016  . MGUS (monoclonal gammopathy of unknown significance) 03/15/2016  . Breast cancer of lower-inner quadrant of right female breast ([place]) 08/26/2015  . Headache 07/17/2015  . Hypertension 07/17/2015  . Obesity (BMI 35.0-39.9 without comorbidity) 07/17/2015   Thank you,  Genene Churn, [place]  Genene Churn 12/12/2017, 6:08 PM  [facility] [address] [place], [place], [address] Phone: [phone]   Fax:  [phone]  Name: Sarah Cooley MRN: [id number] Date of Birth: [birth year]

## 2017-12-18 NOTE — Telephone Encounter (Signed)
[facility] [phone] called pt self referred herself for gait and balance PT, she has an appt today at 4:30 and wants notes to come to Dr Jannifer Franklin. They will need a referral in order to send the notes. Please fax referral to [phone]

## 2017-12-18 NOTE — Telephone Encounter (Signed)
I faxed referral with pt insurance info, demographic sheet and last office notes to [phone]. Received fax confirmation.

## 2017-12-18 NOTE — Addendum Note (Signed)
Addended by: Kathrynn Ducking on: 12/18/2017 04:25 PM   Modules accepted: Orders

## 2017-12-18 NOTE — Telephone Encounter (Signed)
I have placed an order for the physical therapy for [facility].

## 2017-12-30 ENCOUNTER — Telehealth: Payer: Self-pay | Admitting: *Deleted

## 2017-12-30 NOTE — Telephone Encounter (Signed)
Faxed signed plan of care back to [facility] at [phone]. Received fax confirmation.

## 2018-01-03 DIAGNOSIS — R509 Fever, unspecified: Secondary | ICD-10-CM | POA: Diagnosis not present

## 2018-01-03 DIAGNOSIS — R110 Nausea: Secondary | ICD-10-CM | POA: Diagnosis not present

## 2018-01-03 DIAGNOSIS — Z6835 Body mass index (BMI) 35.0-35.9, adult: Secondary | ICD-10-CM | POA: Diagnosis not present

## 2018-01-18 ENCOUNTER — Emergency Department (HOSPITAL_COMMUNITY): Payer: BLUE CROSS/BLUE SHIELD

## 2018-01-18 ENCOUNTER — Encounter (HOSPITAL_COMMUNITY): Payer: Self-pay | Admitting: Emergency Medicine

## 2018-01-18 ENCOUNTER — Emergency Department (HOSPITAL_COMMUNITY)
Admission: EM | Admit: 2018-01-18 | Discharge: 2018-01-18 | Disposition: A | Discharge status: Home / Self Care | Payer: BLUE CROSS/BLUE SHIELD | Attending: Emergency Medicine | Admitting: Emergency Medicine

## 2018-01-18 DIAGNOSIS — Y999 Unspecified external cause status: Secondary | ICD-10-CM | POA: Diagnosis not present

## 2018-01-18 DIAGNOSIS — Z79899 Other long term (current) drug therapy: Secondary | ICD-10-CM | POA: Diagnosis not present

## 2018-01-18 DIAGNOSIS — Y9389 Activity, other specified: Secondary | ICD-10-CM | POA: Insufficient documentation

## 2018-01-18 DIAGNOSIS — S59902A Unspecified injury of left elbow, initial encounter: Secondary | ICD-10-CM | POA: Diagnosis not present

## 2018-01-18 DIAGNOSIS — R079 Chest pain, unspecified: Secondary | ICD-10-CM | POA: Insufficient documentation

## 2018-01-18 DIAGNOSIS — Y92009 Unspecified place in unspecified non-institutional (private) residence as the place of occurrence of the external cause: Secondary | ICD-10-CM | POA: Insufficient documentation

## 2018-01-18 DIAGNOSIS — W19XXXA Unspecified fall, initial encounter: Secondary | ICD-10-CM

## 2018-01-18 DIAGNOSIS — M542 Cervicalgia: Secondary | ICD-10-CM | POA: Diagnosis not present

## 2018-01-18 DIAGNOSIS — S0990XA Unspecified injury of head, initial encounter: Secondary | ICD-10-CM | POA: Diagnosis not present

## 2018-01-18 DIAGNOSIS — M546 Pain in thoracic spine: Secondary | ICD-10-CM | POA: Diagnosis not present

## 2018-01-18 DIAGNOSIS — S060X1A Concussion with loss of consciousness of 30 minutes or less, initial encounter: Secondary | ICD-10-CM | POA: Insufficient documentation

## 2018-01-18 DIAGNOSIS — S199XXA Unspecified injury of neck, initial encounter: Secondary | ICD-10-CM | POA: Diagnosis not present

## 2018-01-18 DIAGNOSIS — M25522 Pain in left elbow: Secondary | ICD-10-CM | POA: Diagnosis not present

## 2018-01-18 DIAGNOSIS — T148XXA Other injury of unspecified body region, initial encounter: Secondary | ICD-10-CM | POA: Diagnosis not present

## 2018-01-18 DIAGNOSIS — Z7902 Long term (current) use of antithrombotics/antiplatelets: Secondary | ICD-10-CM | POA: Diagnosis not present

## 2018-01-18 DIAGNOSIS — N2889 Other specified disorders of kidney and ureter: Secondary | ICD-10-CM | POA: Diagnosis not present

## 2018-01-18 DIAGNOSIS — W109XXA Fall (on) (from) unspecified stairs and steps, initial encounter: Secondary | ICD-10-CM | POA: Insufficient documentation

## 2018-01-18 DIAGNOSIS — S3993XA Unspecified injury of pelvis, initial encounter: Secondary | ICD-10-CM | POA: Diagnosis not present

## 2018-01-18 DIAGNOSIS — S299XXA Unspecified injury of thorax, initial encounter: Secondary | ICD-10-CM | POA: Diagnosis not present

## 2018-01-18 DIAGNOSIS — S3991XA Unspecified injury of abdomen, initial encounter: Secondary | ICD-10-CM | POA: Diagnosis not present

## 2018-01-18 LAB — I-STAT CHEM 8, ED
BUN: 12 mg/dL (ref 6–20)
Calcium, Ion: 1.14 mmol/L — ABNORMAL LOW (ref 1.15–1.40)
Chloride: 97 mmol/L — ABNORMAL LOW (ref 101–111)
Creatinine, Ser: 0.9 mg/dL (ref 0.44–1.00)
Glucose, Bld: 155 mg/dL — ABNORMAL HIGH (ref 65–99)
HCT: 44 % (ref 36.0–46.0)
Hemoglobin: 15 g/dL (ref 12.0–15.0)
Potassium: 2.6 mmol/L — CL (ref 3.5–5.1)
Sodium: 141 mmol/L (ref 135–145)
TCO2: 27 mmol/L (ref 22–32)

## 2018-01-18 LAB — COMPREHENSIVE METABOLIC PANEL
ALT: 45 U/L (ref 14–54)
AST: 44 U/L — ABNORMAL HIGH (ref 15–41)
Albumin: 4 g/dL (ref 3.5–5.0)
Alkaline Phosphatase: 61 U/L (ref 38–126)
Anion gap: 13 (ref 5–15)
BILIRUBIN TOTAL: 0.6 mg/dL (ref 0.3–1.2)
BUN: 10 mg/dL (ref 6–20)
CHLORIDE: 98 mmol/L — AB (ref 101–111)
CO2: 27 mmol/L (ref 22–32)
CREATININE: 0.98 mg/dL (ref 0.44–1.00)
Calcium: 9.4 mg/dL (ref 8.9–10.3)
GFR calc non Af Amer: >60 mL/min (ref >60)
Glucose, Bld: 151 mg/dL — ABNORMAL HIGH (ref 65–99)
POTASSIUM: 2.8 mmol/L — AB (ref 3.5–5.1)
Sodium: 138 mmol/L (ref 135–145)
TOTAL PROTEIN: 7.5 g/dL (ref 6.5–8.1)

## 2018-01-18 LAB — CBC
HCT: 42.5 % (ref 36.0–46.0)
Hemoglobin: 14.7 g/dL (ref 12.0–15.0)
MCH: 31.2 pg (ref 26.0–34.0)
MCHC: 34.6 g/dL (ref 30.0–36.0)
MCV: 90.2 fL (ref 78.0–100.0)
Platelets: 214 10*3/uL (ref 150–400)
RBC: 4.71 MIL/uL (ref 3.87–5.11)
RDW: 13.3 % (ref 11.5–15.5)
WBC: 7.3 10*3/uL (ref 4.0–10.5)

## 2018-01-18 LAB — PREPARE FRESH FROZEN PLASMA
UNIT DIVISION: 0
Unit division: 0

## 2018-01-18 LAB — TYPE AND SCREEN
ABO/RH(D): A POS
ANTIBODY SCREEN: NEGATIVE
UNIT DIVISION: 0
Unit division: 0

## 2018-01-18 LAB — BPAM FFP
BLOOD PRODUCT EXPIRATION DATE: 201904012359
Blood Product Expiration Date: 201904032359
ISSUE DATE / TIME: 201903301611
ISSUE DATE / TIME: 201903301611
UNIT TYPE AND RH: 6200
Unit Type and Rh: 6200

## 2018-01-18 LAB — BPAM RBC
Blood Product Expiration Date: 201904152359
Blood Product Expiration Date: 201904162359
ISSUE DATE / TIME: 201903301610
ISSUE DATE / TIME: 201903301610
UNIT TYPE AND RH: 9500
Unit Type and Rh: 9500

## 2018-01-18 LAB — I-STAT CG4 LACTIC ACID, ED: Lactic Acid, Venous: 3.53 mmol/L (ref 0.5–1.9)

## 2018-01-18 MED ORDER — IOPAMIDOL (ISOVUE-300) INJECTION 61%
INTRAVENOUS | Status: AC
  Administered 2018-01-18: 100 mL
  Filled 2018-01-18: qty 100

## 2018-01-18 MED ORDER — SODIUM CHLORIDE 0.9 % IV BOLUS
1000.0000 mL | Freq: Once | INTRAVENOUS | Status: AC
  Administered 2018-01-18: 1000 mL via INTRAVENOUS

## 2018-01-18 MED ORDER — POTASSIUM CHLORIDE 10 MEQ/100ML IV SOLN
10.0000 meq | Freq: Once | INTRAVENOUS | Status: DC
  Filled 2018-01-18: qty 100

## 2018-01-18 MED ORDER — POTASSIUM CHLORIDE CRYS ER 20 MEQ PO TBCR
40.0000 meq | EXTENDED_RELEASE_TABLET | Freq: Once | ORAL | Status: AC
  Administered 2018-01-18: 40 meq via ORAL
  Filled 2018-01-18: qty 2

## 2018-01-18 NOTE — Progress Notes (Signed)
Orthopedic Tech Progress Note Patient Details:  Sarah Cooley 1966/09/17 [id number]  Patient ID: Baird Cancer, female   DOB: 03-Dec-1965, 52 y.o.   MRN: [id number]   Hildred Priest 01/18/2018, 4:21 PM Made level 2 trauma visit

## 2018-01-18 NOTE — ED Provider Notes (Signed)
[facility] Provider Note   CSN: [id number] Arrival date & time: 01/18/18  1614     History   Chief Complaint Chief Complaint  Patient presents with  . Fall    HPI Sarah Cooley is a 52 y.o. female.  The history is provided by the patient.  Trauma Mechanism of injury: fall Injury location: head/neck and torso Injury location detail: back Incident location: home Arrived directly from scene: yes   Fall:      Fall occurred: down stairs      Point of impact: head and back  EMS/PTA data:      Responsiveness: alert      Oriented to: person, place and situation      Loss of consciousness: yes  Current symptoms:      Associated symptoms:            Reports back pain, headache and loss of consciousness.            Denies abdominal pain, chest pain, nausea, neck pain and vomiting.    History reviewed. No pertinent past medical history. -Fibromyalgia and chronic fatigue, HTN, HLD There are no active problems to display for this patient.     OB History   None      Home Medications    Prior to Admission medications   Medication Sig Start Date End Date Taking? Authorizing Provider  carvedilol (COREG) 12.5 MG tablet Take 12.5 mg by mouth 2 (two) times daily.   Yes [provider]  chlorthalidone (HYGROTON) 25 MG tablet Take 25 mg by mouth daily.   Yes [provider]  clonazePAM (KLONOPIN) 1 MG tablet Take 1 mg by mouth daily.   Yes [provider]  diltiazem (CARDIZEM) 120 MG tablet Take 120 mg by mouth daily.   Yes [provider]  FLUoxetine (PROZAC) 20 MG capsule Take 20 mg by mouth daily.   Yes [provider]  ibuprofen (ADVIL,MOTRIN) 800 MG tablet Take 200 mg by mouth every 6 (six) hours as needed for headache or moderate pain.   Yes [provider]  losartan (COZAAR) 25 MG tablet Take 25 mg by mouth daily.   Yes [provider]  pantoprazole (PROTONIX) 40  MG tablet Take 40 mg by mouth daily before breakfast.   Yes [provider]  potassium chloride SA (K-DUR,KLOR-CON) 20 MEQ tablet Take 20 mEq by mouth daily.   Yes [provider]  pravastatin (PRAVACHOL) 10 MG tablet Take 10 mg by mouth daily.   Yes [provider]  Vitamin D, Ergocalciferol, (DRISDOL) 50000 units CAPS capsule Take 50,000 Units by mouth every 7 (seven) days. Take 1 tablet (50,000 units) by mouth every Thursday   Yes [provider]    Family History No family history on file.  Social History Social History   Tobacco Use  . Smoking status: Never Smoker  Substance Use Topics  . Alcohol use: Never    Frequency: Never  . Drug use: Never     Allergies   Altace [ramipril]; Lyrica [pregabalin]; Minocin [minocycline hcl]; Septra [sulfamethoxazole-trimethoprim]; and Shrimp [shellfish allergy]   Review of Systems Review of Systems  Constitutional: Negative for chills and fever.  HENT: Negative for ear pain and sore throat.   Eyes: Negative for pain and visual disturbance.  Respiratory: Negative for cough and shortness of breath.   Cardiovascular: Negative for chest pain and palpitations.  Gastrointestinal: Negative for abdominal pain, nausea and vomiting.  Genitourinary: Negative  for dysuria and hematuria.  Musculoskeletal: Positive for back pain. Negative for neck pain.  Skin: Negative for color change and rash.  Neurological: Positive for loss of consciousness and headaches. Negative for syncope, weakness, light-headedness and numbness.  All other systems reviewed and are negative.    Physical Exam Updated Vital Signs BP 125/82 (BP Location: Left Arm)   Pulse 67   Resp 14   SpO2 99%   Physical Exam  Constitutional: She is oriented to person, place, and time. She appears well-developed and well-nourished. No distress.  HENT:  Head: Normocephalic.  Mild contusion posterior head  Eyes: Pupils are equal, round, and  reactive to light. Conjunctivae and EOM are normal.  Neck: Neck supple.  Cardiovascular: Normal rate, regular rhythm and intact distal pulses.  No murmur heard. Pulmonary/Chest: Effort normal and breath sounds normal. No respiratory distress. She has no wheezes. She has no rales. She exhibits tenderness.  Abdominal: Soft. She exhibits no distension. There is tenderness in the left upper quadrant. There is no guarding.  Musculoskeletal: She exhibits no edema or deformity.       Lumbar back: She exhibits tenderness and bony tenderness.  Neurological: She is alert and oriented to person, place, and time.  Skin: Skin is warm and dry.  Psychiatric: She has a normal mood and affect.  Nursing note and vitals reviewed.    ED Treatments / Results  Labs (all labs ordered are listed, but only abnormal results are displayed) Labs Reviewed  COMPREHENSIVE METABOLIC PANEL - Abnormal; Notable for the following components:      Result Value   Potassium 2.8 (*)    Chloride 98 (*)    Glucose, Bld 151 (*)    AST 44 (*)    All other components within normal limits  I-STAT CHEM 8, ED - Abnormal; Notable for the following components:   Potassium 2.6 (*)    Chloride 97 (*)    Glucose, Bld 155 (*)    Calcium, Ion 1.14 (*)    All other components within normal limits  I-STAT CG4 LACTIC ACID, ED - Abnormal; Notable for the following components:   Lactic Acid, Venous 3.53 (*)    All other components within normal limits  CBC  TYPE AND SCREEN  PREPARE FRESH FROZEN PLASMA    EKG None  Radiology Dg Elbow Complete Left  Result Date: 01/18/2018 CLINICAL DATA:  Acute LEFT elbow pain following fall today. Initial encounter. EXAM: LEFT ELBOW - COMPLETE 3+ VIEW COMPARISON:  None. FINDINGS: There is no evidence of fracture, dislocation, or joint effusion. There is no evidence of arthropathy or other focal bone abnormality. Soft tissues are unremarkable. IMPRESSION: Negative. Electronically Signed   By:  Margarette Canada M.D.   On: 01/18/2018 17:39   Ct Head Wo Contrast  Result Date: 01/18/2018 CLINICAL DATA:  Fall down 18 stairs EXAM: CT HEAD WITHOUT CONTRAST CT CERVICAL SPINE WITHOUT CONTRAST TECHNIQUE: Multidetector CT imaging of the head and cervical spine was performed following the standard protocol without intravenous contrast. Multiplanar CT image reconstructions of the cervical spine were also generated. COMPARISON:  None. FINDINGS: CT HEAD FINDINGS Brain: No mass lesion, intraparenchymal hemorrhage or extra-axial collection. No evidence of acute cortical infarct. Brain parenchyma and CSF-containing spaces are normal for age. Vascular: No hyperdense vessel or unexpected calcification. Skull: Normal visualized skull base, calvarium and extracranial soft tissues. Sinuses/Orbits: No sinus fluid levels or advanced mucosal thickening. No mastoid effusion. Normal orbits. CT CERVICAL SPINE FINDINGS Alignment: No static subluxation. Facets  are aligned. Occipital condyles are normally positioned. Skull base and vertebrae: No acute fracture. Soft tissues and spinal canal: No prevertebral fluid or swelling. No visible canal hematoma. Disc levels: No advanced spinal canal or neural foraminal stenosis. Upper chest: No pneumothorax, pulmonary nodule or pleural effusion. Other: Normal visualized paraspinal cervical soft tissues. IMPRESSION: Normal CT examination of the head and cervical spine. Electronically Signed   By: Ulyses Jarred M.D.   On: 01/18/2018 17:38   Ct Chest W Contrast  Result Date: 01/18/2018 CLINICAL DATA:  Fall down stairs. EXAM: CT CHEST, ABDOMEN, AND PELVIS WITH CONTRAST TECHNIQUE: Multidetector CT imaging of the chest, abdomen and pelvis was performed following the standard protocol during bolus administration of intravenous contrast. CONTRAST:  151mL ISOVUE-300 IOPAMIDOL (ISOVUE-300) INJECTION 61% COMPARISON:  Pelvic and chest radiographs from earlier today. FINDINGS: CT CHEST FINDINGS  Cardiovascular: Normal heart size. No significant pericardial fluid/thickening. Normal course and caliber of the thoracic aorta. Dilated main pulmonary artery (3.7 cm diameter). No evidence of acute thoracic aortic injury. No central pulmonary emboli. Mediastinum/Nodes: No pneumomediastinum. No mediastinal hematoma. No discrete thyroid nodules. Unremarkable esophagus. No axillary, mediastinal or hilar lymphadenopathy. Lungs/Pleura: No pneumothorax. No pleural effusion. No acute consolidative airspace disease, lung masses or significant pulmonary nodules. No pneumatoceles. Musculoskeletal: No aggressive appearing focal osseous lesions. No fracture detected in the chest. Mild thoracic spondylosis. Surgical clips are noted in the lower right breast. CT ABDOMEN PELVIS FINDINGS Hepatobiliary: Normal liver with no liver laceration or mass. Cholecystectomy. No biliary ductal dilatation. Pancreas: Normal, with no laceration, mass or duct dilation. Spleen: Normal size. No laceration or mass. Adrenals/Urinary Tract: Normal adrenals. No hydronephrosis. No renal laceration. Simple 1.4 cm posterior interpolar right renal cyst. Hypodense 2.0 cm renal cortical mass in the posterior interpolar left kidney (series 10/image 23). Two scattered subcentimeter hypodense lesions in the left kidney, too small to characterize. Normal bladder. Stomach/Bowel: Grossly normal stomach. Normal caliber small bowel with no small bowel wall thickening. Normal appendix. Minimal sigmoid diverticulosis. No large bowel wall thickening or pericolonic fat stranding. Vascular/Lymphatic: Normal caliber abdominal aorta. Patent portal, splenic, hepatic and renal veins. No pathologically enlarged lymph nodes in the abdomen or pelvis. Reproductive: Status post hysterectomy, with no abnormal findings at the vaginal cuff. No adnexal mass. Other: No pneumoperitoneum, ascites or focal fluid collection. Musculoskeletal: No aggressive appearing focal osseous lesions.  No fracture in the abdomen or pelvis. Minimal lumbar spondylosis. IMPRESSION: 1. No evidence of acute traumatic injury in the chest, abdomen or pelvis. 2. Indeterminate 2.0 cm hypodense renal cortical mass in the posterior interpolar left kidney, renal neoplasm not excluded. Recommend short-term outpatient characterization with MRI abdomen without and with IV contrast. This recommendation follows ACR consensus guidelines: Management of the Incidental Renal Mass on CT: A White Paper of the ACR Incidental Findings Committee. J Am Coll Radiol 2018;15:264-273. 3. Dilated main pulmonary artery, suggesting pulmonary arterial hypertension. Electronically Signed   By: Ilona Sorrel M.D.   On: 01/18/2018 17:45   Ct Cervical Spine Wo Contrast  Result Date: 01/18/2018 CLINICAL DATA:  Fall down 18 stairs EXAM: CT HEAD WITHOUT CONTRAST CT CERVICAL SPINE WITHOUT CONTRAST TECHNIQUE: Multidetector CT imaging of the head and cervical spine was performed following the standard protocol without intravenous contrast. Multiplanar CT image reconstructions of the cervical spine were also generated. COMPARISON:  None. FINDINGS: CT HEAD FINDINGS Brain: No mass lesion, intraparenchymal hemorrhage or extra-axial collection. No evidence of acute cortical infarct. Brain parenchyma and CSF-containing spaces are normal for age. Vascular: No  hyperdense vessel or unexpected calcification. Skull: Normal visualized skull base, calvarium and extracranial soft tissues. Sinuses/Orbits: No sinus fluid levels or advanced mucosal thickening. No mastoid effusion. Normal orbits. CT CERVICAL SPINE FINDINGS Alignment: No static subluxation. Facets are aligned. Occipital condyles are normally positioned. Skull base and vertebrae: No acute fracture. Soft tissues and spinal canal: No prevertebral fluid or swelling. No visible canal hematoma. Disc levels: No advanced spinal canal or neural foraminal stenosis. Upper chest: No pneumothorax, pulmonary nodule or  pleural effusion. Other: Normal visualized paraspinal cervical soft tissues. IMPRESSION: Normal CT examination of the head and cervical spine. Electronically Signed   By: Ulyses Jarred M.D.   On: 01/18/2018 17:38   Ct Abdomen Pelvis W Contrast  Result Date: 01/18/2018 CLINICAL DATA:  Fall down stairs. EXAM: CT CHEST, ABDOMEN, AND PELVIS WITH CONTRAST TECHNIQUE: Multidetector CT imaging of the chest, abdomen and pelvis was performed following the standard protocol during bolus administration of intravenous contrast. CONTRAST:  146mL ISOVUE-300 IOPAMIDOL (ISOVUE-300) INJECTION 61% COMPARISON:  Pelvic and chest radiographs from earlier today. FINDINGS: CT CHEST FINDINGS Cardiovascular: Normal heart size. No significant pericardial fluid/thickening. Normal course and caliber of the thoracic aorta. Dilated main pulmonary artery (3.7 cm diameter). No evidence of acute thoracic aortic injury. No central pulmonary emboli. Mediastinum/Nodes: No pneumomediastinum. No mediastinal hematoma. No discrete thyroid nodules. Unremarkable esophagus. No axillary, mediastinal or hilar lymphadenopathy. Lungs/Pleura: No pneumothorax. No pleural effusion. No acute consolidative airspace disease, lung masses or significant pulmonary nodules. No pneumatoceles. Musculoskeletal: No aggressive appearing focal osseous lesions. No fracture detected in the chest. Mild thoracic spondylosis. Surgical clips are noted in the lower right breast. CT ABDOMEN PELVIS FINDINGS Hepatobiliary: Normal liver with no liver laceration or mass. Cholecystectomy. No biliary ductal dilatation. Pancreas: Normal, with no laceration, mass or duct dilation. Spleen: Normal size. No laceration or mass. Adrenals/Urinary Tract: Normal adrenals. No hydronephrosis. No renal laceration. Simple 1.4 cm posterior interpolar right renal cyst. Hypodense 2.0 cm renal cortical mass in the posterior interpolar left kidney (series 10/image 23). Two scattered subcentimeter hypodense  lesions in the left kidney, too small to characterize. Normal bladder. Stomach/Bowel: Grossly normal stomach. Normal caliber small bowel with no small bowel wall thickening. Normal appendix. Minimal sigmoid diverticulosis. No large bowel wall thickening or pericolonic fat stranding. Vascular/Lymphatic: Normal caliber abdominal aorta. Patent portal, splenic, hepatic and renal veins. No pathologically enlarged lymph nodes in the abdomen or pelvis. Reproductive: Status post hysterectomy, with no abnormal findings at the vaginal cuff. No adnexal mass. Other: No pneumoperitoneum, ascites or focal fluid collection. Musculoskeletal: No aggressive appearing focal osseous lesions. No fracture in the abdomen or pelvis. Minimal lumbar spondylosis. IMPRESSION: 1. No evidence of acute traumatic injury in the chest, abdomen or pelvis. 2. Indeterminate 2.0 cm hypodense renal cortical mass in the posterior interpolar left kidney, renal neoplasm not excluded. Recommend short-term outpatient characterization with MRI abdomen without and with IV contrast. This recommendation follows ACR consensus guidelines: Management of the Incidental Renal Mass on CT: A White Paper of the ACR Incidental Findings Committee. J Am Coll Radiol 2018;15:264-273. 3. Dilated main pulmonary artery, suggesting pulmonary arterial hypertension. Electronically Signed   By: Ilona Sorrel M.D.   On: 01/18/2018 17:45   Dg Pelvis Portable  Result Date: 01/18/2018 CLINICAL DATA:  Fall down stairs today. EXAM: PORTABLE PELVIS 1-2 VIEWS COMPARISON:  None. FINDINGS: There is no evidence of pelvic fracture or diastasis. No pelvic bone lesions are seen. IMPRESSION: No fracture. Electronically Signed   By: Janina Mayo.D.  On: 01/18/2018 17:01   Dg Chest Port 1 View  Result Date: 01/18/2018 CLINICAL DATA:  Fall down stairs today EXAM: PORTABLE CHEST 1 VIEW COMPARISON:  None. FINDINGS: Low lung volumes. Normal heart size. Mediastinal contour is within normal  limits accounting for low lung volumes. No pneumothorax. No pleural effusion. Lungs appear clear, with no acute consolidative airspace disease and no pulmonary edema. No displaced fracture in the visualized chest. Surgical clips overlie the right breast. IMPRESSION: Low lung volumes.  No active cardiopulmonary disease. Electronically Signed   By: Ilona Sorrel M.D.   On: 01/18/2018 16:57    Procedures Procedures (including critical care time)  Medications Ordered in ED Medications  iopamidol (ISOVUE-300) 61 % injection (100 mLs  Contrast Given 01/18/18 1638)  sodium chloride 0.9 % bolus 1,000 mL (0 mLs Intravenous Stopped 01/18/18 2000)  potassium chloride SA (K-DUR,KLOR-CON) CR tablet 40 mEq (40 mEq Oral Given 01/18/18 1933)     Initial Impression / Assessment and Plan / ED Course  I have reviewed the triage vital signs and the nursing notes.  Pertinent labs & imaging results that were available during my care of the patient were reviewed by me and considered in my medical decision making (see chart for details).     Patient is a 52 year old female with history of hypertension, hyperlipidemia, fibromyalgia, chronic fatigue syndrome who presents after a fall down steps.  Patient fell down backwards after she lost her balance while passing a car seat with a child to a family member and running into a stair chair.  Patient fell straight back and slid down the entire flight of steps hitting her head.  She had a positive loss of consciousness for a few minutes.  Patient was alert with EMS.  She does not take any blood thinners.    There was a initial blood pressure reported of 85 systolic with EMS patient initially made a level 1 trauma.  On arrival she is normotensive so she is downgraded to a level 2 trauma.  She has a mild contusion to the back of her head and tenderness to the lumbar spine.  She has some left upper quadrant abdominal pain and left lower rib tenderness.  Based on the nature of  her mechanism, plan for full trauma scans.  Fortunately O her trauma scans were negative for any acute injury.  And similarly it did pick up on a 2 cm left renal mass which the patient was informed about and patient instructed follow-up with PCP for MRI outpatient.  She most likely has a concussion based on her fall.  Patient to follow-up with PCP for this as well.  Patient counseled on mental rest, hydration, proper sleep for this.  Patient to take Tylenol or ibuprofen for pain.  Work notice given.  Patient discharged in good condition.  Final Clinical Impressions(s) / ED Diagnoses   Final diagnoses:  Fall, initial encounter  Concussion with loss of consciousness of 30 minutes or less, initial encounter  Left renal mass    ED Discharge Orders    None       Tobie Poet, DO 01/19/18 [id number]    Elnora Morrison, MD 01/20/18 [phone]

## 2018-01-18 NOTE — ED Notes (Signed)
I Stat Lactic Acid results of 3.53, and I Stat Chem 8 result of K 2.6 reported to Dr. Reather Converse

## 2018-01-18 NOTE — ED Triage Notes (Signed)
Pt here after falling down approx 18 steps backwards  Hitting the concrete floor no loc , pt has issues with balance

## 2018-01-20 ENCOUNTER — Encounter (HOSPITAL_COMMUNITY): Payer: Self-pay | Admitting: Speech Pathology

## 2018-01-21 DIAGNOSIS — Z9181 History of falling: Secondary | ICD-10-CM | POA: Diagnosis not present

## 2018-01-21 DIAGNOSIS — R531 Weakness: Secondary | ICD-10-CM | POA: Diagnosis not present

## 2018-01-21 DIAGNOSIS — S01552A Open bite of oral cavity, initial encounter: Secondary | ICD-10-CM | POA: Diagnosis not present

## 2018-01-21 DIAGNOSIS — N2889 Other specified disorders of kidney and ureter: Secondary | ICD-10-CM | POA: Diagnosis not present

## 2018-01-22 NOTE — Progress Notes (Signed)
Time should be 1615 not 1424

## 2018-01-30 DIAGNOSIS — N2889 Other specified disorders of kidney and ureter: Secondary | ICD-10-CM | POA: Diagnosis not present

## 2018-01-30 DIAGNOSIS — K760 Fatty (change of) liver, not elsewhere classified: Secondary | ICD-10-CM | POA: Diagnosis not present

## 2018-01-31 DIAGNOSIS — C649 Malignant neoplasm of unspecified kidney, except renal pelvis: Secondary | ICD-10-CM | POA: Diagnosis not present

## 2018-02-06 DIAGNOSIS — R109 Unspecified abdominal pain: Secondary | ICD-10-CM | POA: Diagnosis not present

## 2018-02-06 DIAGNOSIS — N2889 Other specified disorders of kidney and ureter: Secondary | ICD-10-CM | POA: Diagnosis not present

## 2018-02-25 ENCOUNTER — Inpatient Hospital Stay (HOSPITAL_COMMUNITY): Payer: BLUE CROSS/BLUE SHIELD | Attending: Internal Medicine

## 2018-02-25 DIAGNOSIS — Z86000 Personal history of in-situ neoplasm of breast: Secondary | ICD-10-CM | POA: Diagnosis not present

## 2018-02-25 DIAGNOSIS — Z6836 Body mass index (BMI) 36.0-36.9, adult: Secondary | ICD-10-CM | POA: Diagnosis not present

## 2018-02-25 DIAGNOSIS — Z923 Personal history of irradiation: Secondary | ICD-10-CM | POA: Insufficient documentation

## 2018-02-25 DIAGNOSIS — R42 Dizziness and giddiness: Secondary | ICD-10-CM | POA: Diagnosis not present

## 2018-02-25 DIAGNOSIS — I288 Other diseases of pulmonary vessels: Secondary | ICD-10-CM | POA: Diagnosis not present

## 2018-02-25 DIAGNOSIS — N644 Mastodynia: Secondary | ICD-10-CM | POA: Insufficient documentation

## 2018-02-25 DIAGNOSIS — D242 Benign neoplasm of left breast: Secondary | ICD-10-CM | POA: Diagnosis not present

## 2018-02-25 DIAGNOSIS — C499 Malignant neoplasm of connective and soft tissue, unspecified: Secondary | ICD-10-CM | POA: Diagnosis not present

## 2018-02-25 DIAGNOSIS — N2889 Other specified disorders of kidney and ureter: Secondary | ICD-10-CM | POA: Insufficient documentation

## 2018-02-25 DIAGNOSIS — I10 Essential (primary) hypertension: Secondary | ICD-10-CM | POA: Diagnosis not present

## 2018-02-25 DIAGNOSIS — D472 Monoclonal gammopathy: Secondary | ICD-10-CM

## 2018-02-25 LAB — COMPREHENSIVE METABOLIC PANEL
ALBUMIN: 4 g/dL (ref 3.5–5.0)
ALT: 31 U/L (ref 14–54)
ANION GAP: 13 (ref 5–15)
AST: 26 U/L (ref 15–41)
Alkaline Phosphatase: 64 U/L (ref 38–126)
BUN: 13 mg/dL (ref 6–20)
CHLORIDE: 97 mmol/L — AB (ref 101–111)
CO2: 28 mmol/L (ref 22–32)
Calcium: 9.4 mg/dL (ref 8.9–10.3)
Creatinine, Ser: 0.75 mg/dL (ref 0.44–1.00)
GFR calc Af Amer: >60 mL/min (ref >60)
GLUCOSE: 96 mg/dL (ref 65–99)
POTASSIUM: 3.2 mmol/L — AB (ref 3.5–5.1)
Sodium: 138 mmol/L (ref 135–145)
Total Bilirubin: 0.7 mg/dL (ref 0.3–1.2)
Total Protein: 7.8 g/dL (ref 6.5–8.1)

## 2018-02-25 LAB — CBC WITH DIFFERENTIAL/PLATELET
BASOS PCT: 0 %
Basophils Absolute: 0 10*3/uL (ref 0.0–0.1)
EOS ABS: 0.2 10*3/uL (ref 0.0–0.7)
EOS PCT: 2 %
HCT: 40.3 % (ref 36.0–46.0)
Hemoglobin: 14 g/dL (ref 12.0–15.0)
LYMPHS ABS: 2.2 10*3/uL (ref 0.7–4.0)
LYMPHS PCT: 30 %
MCH: 31.8 pg (ref 26.0–34.0)
MCHC: 34.7 g/dL (ref 30.0–36.0)
MCV: 91.6 fL (ref 78.0–100.0)
Monocytes Absolute: 0.6 10*3/uL (ref 0.1–1.0)
Monocytes Relative: 8 %
NEUTROS PCT: 60 %
Neutro Abs: 4.4 10*3/uL (ref 1.7–7.7)
PLATELETS: 210 10*3/uL (ref 150–400)
RBC: 4.4 MIL/uL (ref 3.87–5.11)
RDW: 13.5 % (ref 11.5–15.5)
WBC: 7.3 10*3/uL (ref 4.0–10.5)

## 2018-02-26 LAB — PROTEIN ELECTROPHORESIS, SERUM
A/G Ratio: 1.1 (ref 0.7–1.7)
ALPHA-1-GLOBULIN: 0.3 g/dL (ref 0.0–0.4)
ALPHA-2-GLOBULIN: 0.8 g/dL (ref 0.4–1.0)
Albumin ELP: 3.8 g/dL (ref 2.9–4.4)
Beta Globulin: 1.3 g/dL (ref 0.7–1.3)
GLOBULIN, TOTAL: 3.4 g/dL (ref 2.2–3.9)
Gamma Globulin: 1 g/dL (ref 0.4–1.8)
Total Protein ELP: 7.2 g/dL (ref 6.0–8.5)

## 2018-02-26 LAB — IMMUNOFIXATION ELECTROPHORESIS
IGA: 529 mg/dL — AB (ref 87–352)
IgG (Immunoglobin G), Serum: 1048 mg/dL (ref 700–1600)
IgM (Immunoglobulin M), Srm: 65 mg/dL (ref 26–217)
Total Protein ELP: 7.1 g/dL (ref 6.0–8.5)

## 2018-02-26 LAB — IGG, IGA, IGM
IGA: 511 mg/dL — AB (ref 87–352)
IGM (IMMUNOGLOBULIN M), SRM: 67 mg/dL (ref 26–217)
IgG (Immunoglobin G), Serum: 1074 mg/dL (ref 700–1600)

## 2018-02-26 LAB — KAPPA/LAMBDA LIGHT CHAINS
Kappa free light chain: 30.4 mg/L — ABNORMAL HIGH (ref 3.3–19.4)
Kappa, lambda light chain ratio: 1.62 (ref 0.26–1.65)
LAMDA FREE LIGHT CHAINS: 18.8 mg/L (ref 5.7–26.3)

## 2018-02-27 ENCOUNTER — Inpatient Hospital Stay (HOSPITAL_BASED_OUTPATIENT_CLINIC_OR_DEPARTMENT_OTHER): Payer: BLUE CROSS/BLUE SHIELD | Admitting: Internal Medicine

## 2018-02-27 ENCOUNTER — Other Ambulatory Visit: Payer: Self-pay

## 2018-02-27 DIAGNOSIS — D472 Monoclonal gammopathy: Secondary | ICD-10-CM

## 2018-02-27 DIAGNOSIS — I288 Other diseases of pulmonary vessels: Secondary | ICD-10-CM

## 2018-02-27 DIAGNOSIS — Z923 Personal history of irradiation: Secondary | ICD-10-CM | POA: Diagnosis not present

## 2018-02-27 DIAGNOSIS — I10 Essential (primary) hypertension: Secondary | ICD-10-CM | POA: Diagnosis not present

## 2018-02-27 DIAGNOSIS — C50311 Malignant neoplasm of lower-inner quadrant of right female breast: Secondary | ICD-10-CM

## 2018-02-27 DIAGNOSIS — D242 Benign neoplasm of left breast: Secondary | ICD-10-CM

## 2018-02-27 DIAGNOSIS — Z171 Estrogen receptor negative status [ER-]: Principal | ICD-10-CM

## 2018-02-27 DIAGNOSIS — D0511 Intraductal carcinoma in situ of right breast: Secondary | ICD-10-CM

## 2018-02-27 DIAGNOSIS — N644 Mastodynia: Secondary | ICD-10-CM

## 2018-02-27 DIAGNOSIS — Z86000 Personal history of in-situ neoplasm of breast: Secondary | ICD-10-CM

## 2018-02-27 DIAGNOSIS — N2889 Other specified disorders of kidney and ureter: Secondary | ICD-10-CM

## 2018-02-27 NOTE — Progress Notes (Signed)
Diagnosis Malignant neoplasm of lower-inner quadrant of right breast of female, estrogen receptor negative ([place])  Ductal carcinoma in situ (DCIS) of right breast - Plan: CBC with Differential/Platelet, Comprehensive metabolic panel, Lactate dehydrogenase, Protein electrophoresis, serum, Ferritin, IgG, IgA, IgM, Kappa/lambda light chains  Fibroadenoma of breast, left  Staging Cancer Staging Breast cancer of lower-inner quadrant of right female breast [facility]) Staging form: Breast, AJCC 7th Edition - Clinical stage from 08/31/2015: Stage 0 (Tis (DCIS), N0, M0) - Unsigned Staging comments: Staged at breast conference on 11.9.16 - Pathologic stage from 10/13/2015: Stage 0 (Tis (DCIS), N0, cM0) - Signed by Holley Bouche, NP on 02/09/2016   Assessment and Plan:  1.  High Grade DCIS R breast.  Pt was previously followed by Dr. Talbert Cage.  She is ER- PR- and had Negative Sentinel LN.  She underwent Right breast seed localized partial mastectomy with right axillary sentinel lymph node mapping Dr. Brantley Stage 10/13/2015.  She was treated with Adjuvant XRT.  Pt had bilateral diagnostic mammogram done 03/19/2017 that showed IMPRESSION: Expected postsurgical changes, post right breast lumpectomy and radiation therapy. No imaging evidence of malignancy.  RECOMMENDATION: Surgical consultation and contrast-enhanced breast MRI are recommended, because of patient's right nipple discharge.   She had bilateral breast MRI done 04/19/2017 that showed  IMPRESSION: 1. Enhancing mass within the upper inner quadrant of the left breast, measuring 6 mm, with suspicious washout kinetics. 2. Enhancing mass within the upper-outer quadrant of the left breast, measuring 7 mm, also with suspicious washout kinetics. 3. No evidence of malignancy within the right breast. Post lumpectomy changes within the right breast.  RECOMMENDATION: MRI-guided biopsies of the 2 suspicious enhancing masses within the left breast,  upper inner quadrant and upper outer quadrant.  Pt had biopsy done of 2 areas in left breast on 05/01/2017 with pathology returning as Diagnosis 1. Breast, left, needle core biopsy, UOQ - FIBROADENOMA. - FIBROCYSTIC CHANGE. - NO MALIGNANCY IDENTIFIED. 2. Breast, left, needle core biopsy, UIQ - FIBROCYSTIC CHANGE. - NO MALIGNANCY IDENTIFIED.  She is due to repeat imaging in 02/2018 and will be set up bilateral diagnostic mammogram.  She will also be referred Back to Dr. Brantley Stage for evaluation due to ongoing complaints of breast pain and recent diagnosis of left breast fibroadenoma.    2.  Left breast fibroadenoma.  I have discussed with her these are considered benign lesions.  She will be set up for bilateral diagnostic mammogram in 02/2018.  She is also referred to Dr. Brantley Stage for evaluation due to ongoing complaints of breast pain.   3.  Renal lesion.  She was seen in ER for evaluation after a fall.  She had CT CAP done 01/18/2018 that showed IMPRESSION: 1. No evidence of acute traumatic injury in the chest, abdomen or pelvis. 2. Indeterminate 2.0 cm hypodense renal cortical mass in the posterior interpolar left kidney, renal neoplasm not excluded. Recommend short-term outpatient characterization with MRI abdomen without and with IV contrast. This recommendation follows ACR consensus guidelines: Management of the Incidental Renal Mass on CT: A White Paper of the ACR Incidental Findings Committee. J Am Coll Radiol 2018;15:264-273. 3. Dilated main pulmonary artery, suggesting pulmonary arterial Hypertension.  She reports she is scheduled to be seen by [facility].  She has also undergone MRI of the abdomen reportedly at [place].  She will follow-up once evaluation with Urology has been done.    4.  MGUS.  SPEP done 02/25/2018 is negative.  CBC shows WBC 7.3 HB 14 and  plts 210,000.  Calcium is normal at 9.4, Cr 0.75.  She will RTC in 3 months for repeat labs.  I have discussed  with her  Recent SPEP negative.  Will check Kappa/lambda light chains on RTC.    Current Status:  Pt is seen today for follow-up to go over labs.  She reports she will see [facility] in next few weeks.  She suffered a fall and had CT done in ER that showed a kidney mass.   She complains of bilateral breast pain.    Oncology History   Stage 0: Right breast DCIS, high-grade, ER/PR negative     Breast cancer of lower-inner quadrant of right female breast ([place])   08/19/2015 Breast US    Masslike asymmetry within the lower inner quadrant of the right breast, at posterior depth, with associated microcalcifications which are new.       08/23/2015 Initial Biopsy    AT LEAST HIGH DUCTAL CARCINOMA IN SITU      08/23/2015 Receptors her2    Estrogen Receptor: 0%, NEGATIVE Progesterone Receptor: 0%, NEGATIVE      08/26/2015 Initial Diagnosis    Breast cancer of lower-inner quadrant of right female breast ([place])      08/31/2015 Procedure    [facility] negative.  Genes tested include: ATM, BRCA1, BRCA2, CDH1, CHEK2, PALB2, PTEN, & TP53.       10/13/2015 Pathologic Stage    pTis, pN0: Stage 0       10/13/2015 Surgery    Right lumpectomy & SLNB ([name]). High grade DCIS, 0.4 cm. Negative margins.  1 right axillary SLN neg. ER/PR repeated and both remain negative.       11/17/2015 - 12/21/2015 Radiation Therapy    Treated at [facility] [place]). Right breast: Total dose 50 Gy in 25 fractions. 3-D tangents; 6 MV photons.         Problem List Patient Active Problem List   Diagnosis Date Noted  . Dizziness [R42] 10/29/2017  . Gait abnormality [R26.9] 10/29/2017  . Tremor, essential [G25.0] 10/29/2017  . Diplopia [H53.2] 10/29/2017  . Obstructive sleep apnea on CPAP [G47.33, Z99.89] 12/10/2016  . Plantar fasciitis, bilateral [M72.2] 12/10/2016  . Fibromyalgia [M79.7] 11/09/2016  . IBS (irritable bowel syndrome) [K58.9] 11/09/2016  . GERD (gastroesophageal reflux disease)  [K21.9] 11/09/2016  . Anxiety and depression [F41.9, F32.9] 11/09/2016  . MGUS (monoclonal gammopathy of unknown significance) [D47.2] 03/15/2016  . Breast cancer of lower-inner quadrant of right female breast ([place]) [C50.311] 08/26/2015  . Headache [R51] 07/17/2015  . Hypertension [I10] 07/17/2015  . Obesity (BMI 35.0-39.9 without comorbidity) [E66.9] 07/17/2015    Past Medical History Past Medical History:  Diagnosis Date  . Allergy    seasonal  . Anxiety   . Arthritis   . Balance problem   . Brain aneurysm    2 mm ACA region aneurysm by 09/21/15 MRA  . Breast cancer ([place])   . Breast cancer of lower-inner quadrant of right female breast ([place]) 08/26/2015  . Chronic kidney disease    acute renal failure one occasion  . Complication of anesthesia    heart rate drops and O2 sats drop  . Constipation   . Depression   . Diabetes mellitus without complication ([place])    not on medication at this time  . Diplopia 10/29/2017  . Dizziness 10/29/2017  . Elevated liver function tests   . Family history of adverse reaction to anesthesia    mom has n/v  . Family history  of breast cancer   . Family history of colon cancer   . Fibromyalgia   . Fibromyalgia   . Gait abnormality 10/29/2017  . Genetic testing 09/08/2015   Negative genetic testing on the Breast/High Moderate Risk panel. The Breast High/Moderate Risk gene panel offered by [facility] includes sequencing and deletion/duplication analysis of the following 9 genes: ATM, BRCA1, BRCA2, CDH1, CHEK2, PALB2, PTEN, STK11, and TP53. The report date is September 08, 2015.  The Rest of the Comprehensive Cancer panel has been reflexed and will be available in 2-3 weeks.  POLD1 c.208G>T VUS found on the Remainder of the Comprehensive cancer panel.  The Comprehensive Cancer Panel offered by [facility] includes sequencing and/or deletion duplication testing of the following 32 genes: APC, ATM, AXIN2, BARD1, BMPR1A, BRCA1, BRCA2, BRIP1, CDH1, CDK4, CDKN2A, CHEK2,  EPCAM, FANCC, MLH1, MSH2, MSH6, MUTYH, NBN, PALB2, PMS2, POLD1, POLE, PTEN, RAD51C, RAD51D, SCG5/GREM1, SMAD4, STK11, TP53, VHL, and XRCC2.   The report date is 09/12/2015.   Marland Kitchen GERD (gastroesophageal reflux disease)    none recent  . H/O acute renal failure 09/22/15   admitted to [facility]  . Headache    occipital and temporal  . History of hiatal hernia   . Hyperlipemia   . Hyperlipidemia   . Hypertension   . Irritable bowel syndrome (IBS)   . MGUS (monoclonal gammopathy of unknown significance) 03/15/2016  . Neuromuscular disorder (H[facility])    fibromyalgia  . Occasional tremors   . OSA (obstructive sleep apnea)    uses cpap with humidification  . PONV (postoperative nausea and vomiting)   . Tremor, essential 10/29/2017    Past Surgical History Past Surgical History:  Procedure Laterality Date  . ABDOMINAL HYSTERECTOMY     adenomyosis  . ABDOMINAL HYSTERECTOMY    . BLADDER SURGERY    . BONE GRAFT HIP ILIAC CREST    . BREAST LUMPECTOMY    . BREAST SURGERY    . CHOLECYSTECTOMY    . FRACTURE SURGERY Right    wrist  . KNEE ARTHROSCOPY    . KNEE ARTHROSCOPY W/ ACL RECONSTRUCTION     right  . RADIOACTIVE SEED GUIDED PARTIAL MASTECTOMY WITH AXILLARY SENTINEL LYMPH NODE BIOPSY Right 10/13/2015   Procedure: RADIOACTIVE SEED GUIDED PARTIAL MASTECTOMY WITH AXILLARY SENTINEL LYMPH NODE BIOPSY;  Surgeon: Erroll Luna, MD;  Location: [place];  Service: General;  Laterality: Right;  . WRIST SURGERY      Family History Family History  Problem Relation Age of Onset  . Hypertension Mother   . Autoimmune disease Mother        lichen planus  . Eczema Mother   . Parkinson's disease Mother   . Mental illness Mother        bipolar  . Heart disease Maternal Aunt   . Lung cancer Paternal Aunt   . Breast cancer Paternal Aunt        dx in her 61s  . Lung cancer Paternal Grandfather   . Colon cancer Paternal Uncle        dx in her 28s  . Colon cancer Paternal Uncle        dx in his  54s  . Melanoma Father 71  . Psoriasis Father   . Hypertension Father   . Heart disease Father   . Breast cancer Sister        dx in her 24s  . Lupus Sister   . COPD Maternal Uncle   . Colon cancer Paternal Uncle  dx in his 27s  . Mental illness Son        bipolar     Social History  reports that she has never smoked. She does not have any smokeless tobacco history on file. She reports that she does not drink alcohol or use drugs.  Medications  Current Outpatient Medications:  .  carvedilol (COREG) 12.5 MG tablet, Take 12.5 mg by mouth 2 (two) times daily., Disp: , Rfl:  .  chlorthalidone (HYGROTON) 25 MG tablet, Take 25 mg by mouth daily., Disp: , Rfl:  .  clonazePAM (KLONOPIN) 1 MG tablet, Take 1 mg by mouth daily., Disp: , Rfl:  .  diltiazem (CARDIZEM) 120 MG tablet, Take 120 mg by mouth daily., Disp: , Rfl:  .  FLUoxetine (PROZAC) 20 MG capsule, Take 20 mg by mouth daily., Disp: , Rfl:  .  FLUoxetine (PROZAC) 40 MG capsule, Take 40 mg by mouth daily., Disp: , Rfl:  .  pantoprazole (PROTONIX) 40 MG tablet, Take 40 mg by mouth daily before breakfast., Disp: , Rfl:  .  potassium chloride SA (K-DUR,KLOR-CON) 20 MEQ tablet, Take 20 mEq by mouth daily., Disp: , Rfl:  .  pravastatin (PRAVACHOL) 10 MG tablet, Take 10 mg by mouth daily., Disp: , Rfl:  .  Vitamin D, Ergocalciferol, (DRISDOL) 50000 units CAPS capsule, Take 50,000 Units by mouth every 7 (seven) days. Take 1 tablet (50,000 units) by mouth every Thursday, Disp: , Rfl:   Allergies Ramipril; Shrimp [shellfish allergy]; Minocycline hcl; Altace [ramipril]; Lyrica [pregabalin]; Adhesive [tape]; Bactrim [sulfamethoxazole-trimethoprim]; Drixoral [brompheniramine-pseudoeph]; Erythromycin; Lyrica [pregabalin]; Minocin [minocycline hcl]; Septra [sulfamethoxazole-trimethoprim]; Shrimp [shellfish allergy]; and Sinutab [chlorphen-pseudoephed-apap]  Review of Systems Review of Systems - Oncology ROS as per HPI otherwise 12 point  ROS is negative.   Physical Exam  Vitals Wt Readings from Last 3 Encounters:  10/29/17 247 lb 8 oz (112.3 kg)  04/29/17 246 lb (111.6 kg)  12/10/16 240 lb (108.9 kg)   Temp Readings from Last 3 Encounters:  12/10/16 97.8 F (36.6 C) (Temporal)  11/09/16 98.1 F (36.7 C) (Oral)  10/30/16 97.6 F (36.4 C) (Oral)   BP Readings from Last 3 Encounters:  01/18/18 125/82  10/29/17 117/77  04/29/17 (!) 151/89   Pulse Readings from Last 3 Encounters:  01/18/18 67  10/29/17 70  04/29/17 (!) 55   Constitutional: Well-developed, well-nourished, and in no distress.   HENT: Head: Normocephalic and atraumatic.  Mouth/Throat: No oropharyngeal exudate. Mucosa moist. Eyes: Pupils are equal, round, and reactive to light. Conjunctivae are normal. No scleral icterus.  Neck: Normal range of motion. Neck supple. No JVD present.  Cardiovascular: Normal rate, regular rhythm and normal heart sounds.  Exam reveals no gallop and no friction rub.   No murmur heard. Pulmonary/Chest: Effort normal and breath sounds normal. No respiratory distress. No wheezes.No rales.  Abdominal: Soft. Bowel sounds are normal. No distension. There is no tenderness. There is no guarding.  Musculoskeletal: No edema or tenderness.  Lymphadenopathy: No cervical, axillary or supraclavicular adenopathy.  Neurological: Alert and oriented to person, place, and time. No cranial nerve deficit.  Skin: Skin is warm and dry. No rash noted. No erythema. No pallor.  Psychiatric: Affect and judgment normal.  Bilateral breast exam:  Chaperone present.  Right breast lumpectomy changes noted. No palpable masses noted bilaterally.   Pt is complaining of bilateral breast pain.    Labs Appointment on 02/25/2018  Component Date Value Ref Range Status  . WBC 02/25/2018 7.3  4.0 - 10.5  K/uL Final  . RBC 02/25/2018 4.40  3.87 - 5.11 MIL/uL Final  . Hemoglobin 02/25/2018 14.0  12.0 - 15.0 g/dL Final  . HCT 02/25/2018 40.3  36.0 - 46.0 %  Final  . MCV 02/25/2018 91.6  78.0 - 100.0 fL Final  . MCH 02/25/2018 31.8  26.0 - 34.0 pg Final  . MCHC 02/25/2018 34.7  30.0 - 36.0 g/dL Final  . RDW 02/25/2018 13.5  11.5 - 15.5 % Final  . Platelets 02/25/2018 210  150 - 400 K/uL Final  . Neutrophils Relative % 02/25/2018 60  % Final  . Neutro Abs 02/25/2018 4.4  1.7 - 7.7 K/uL Final  . Lymphocytes Relative 02/25/2018 30  % Final  . Lymphs Abs 02/25/2018 2.2  0.7 - 4.0 K/uL Final  . Monocytes Relative 02/25/2018 8  % Final  . Monocytes Absolute 02/25/2018 0.6  0.1 - 1.0 K/uL Final  . Eosinophils Relative 02/25/2018 2  % Final  . Eosinophils Absolute 02/25/2018 0.2  0.0 - 0.7 K/uL Final  . Basophils Relative 02/25/2018 0  % Final  . Basophils Absolute 02/25/2018 0.0  0.0 - 0.1 K/uL Final   Performed at [facility], [address] [place], [place] [address]  . Sodium 02/25/2018 138  135 - 145 mmol/L Final  . Potassium 02/25/2018 3.2* 3.5 - 5.1 mmol/L Final  . Chloride 02/25/2018 97* 101 - 111 mmol/L Final  . CO2 02/25/2018 28  22 - 32 mmol/L Final  . Glucose, Bld 02/25/2018 96  65 - 99 mg/dL Final  . BUN 02/25/2018 13  6 - 20 mg/dL Final  . Creatinine, Ser 02/25/2018 0.75  0.44 - 1.00 mg/dL Final  . Calcium 02/25/2018 9.4  8.9 - 10.3 mg/dL Final  . Total Protein 02/25/2018 7.8  6.5 - 8.1 g/dL Final  . Albumin 02/25/2018 4.0  3.5 - 5.0 g/dL Final  . AST 02/25/2018 26  15 - 41 U/L Final  . ALT 02/25/2018 31  14 - 54 U/L Final  . Alkaline Phosphatase 02/25/2018 64  38 - 126 U/L Final  . Total Bilirubin 02/25/2018 0.7  0.3 - 1.2 mg/dL Final  . GFR calc non Af Amer 02/25/2018 >60  >60 mL/min Final  . GFR calc Af Amer 02/25/2018 >60  >60 mL/min Final   Comment: (NOTE) The eGFR has been calculated using the CKD EPI equation. This calculation has not been validated in all clinical situations. eGFR's persistently <60 mL/min signify possible Chronic Kidney Disease.   Georgiann Hahn gap 02/25/2018 13  5 - 15 Final   Performed at [facility], [address] [place], [place] [address]  . Kappa free light chain 02/25/2018 30.4* 3.3 - 19.4 mg/L Final  . Lamda free light chains 02/25/2018 18.8  5.7 - 26.3 mg/L Final  . Kappa, lamda light chain ratio 02/25/2018 1.62  0.26 - 1.65 Final   Comment: (NOTE) Performed At: [facility] [place], [place] [id number] Rush Farmer MD [id number] Performed at [facility], [address], [place], [place] [address]   . IgG (Immunoglobin G), Serum 02/25/2018 1,074  700 - 1,600 mg/dL Final  . IgA 02/25/2018 511* 87 - 352 mg/dL Final  . IgM (Immunoglobulin M), Srm 02/25/2018 67  26 - 217 mg/dL Final   Comment: (NOTE) Performed At: [facility] [address] [place], [place] [id number] Rush Farmer MD [id number] Performed at [facility], [address] [place], [place] [address]   . Total Protein ELP 02/25/2018 7.1  6.0 - 8.5 g/dL Final  . IgG (Immunoglobin G), Serum 02/25/2018 1,048  700 - 1,600 mg/dL Final  . IgA 02/25/2018 529* 87 - 352 mg/dL Final  . IgM (Immunoglobulin M), Srm 02/25/2018 65  26 - 217 mg/dL Final   Comment: (NOTE) Performed At: [facility] [place], [place] [id number] Rush Farmer MD [id number]   . Immunofixation Result, Serum 02/25/2018 Comment   Corrected   Comment: (NOTE) An apparent polyclonal gammopathy: IgA. Kappa and lambda typing appear increased. Performed at [facility], [address] [place], [place] [address]   . Total Protein ELP 02/25/2018 7.2  6.0 - 8.5 g/dL Final  . Albumin ELP 02/25/2018 3.8  2.9 - 4.4 g/dL Final  . Alpha-1-Globulin 02/25/2018 0.3  0.0 - 0.4 g/dL Final  . Alpha-2-Globulin 02/25/2018 0.8  0.4 - 1.0 g/dL Final  . Beta Globulin 02/25/2018 1.3  0.7 - 1.3 g/dL Final  . Gamma Globulin 02/25/2018 1.0  0.4 - 1.8 g/dL Final  . M-Spike, % 02/25/2018 Not Observed  Not Observed g/dL Final  . SPE Interp. 02/25/2018 Comment   Final   Comment: (NOTE) The SPE pattern  appears essentially unremarkable. Evidence of monoclonal protein is not apparent. Performed At: [facility] [place], [place] [id number] Rush Farmer MD [id number]   . Comment 02/25/2018 Comment   Final   Comment: (NOTE) Protein electrophoresis scan will follow via computer, mail, or courier delivery.   Marland Kitchen GLOBULIN, TOTAL 02/25/2018 3.4  2.2 - 3.9 g/dL Corrected  . A/G Ratio 02/25/2018 1.1  0.7 - 1.7 Corrected   Performed at [facility], [address], [place], [place] [address]     Pathology Orders Placed This Encounter  Procedures  . CBC with Differential/Platelet    Standing Status:   Future    Standing Expiration Date:   02/28/2019  . Comprehensive metabolic panel    Standing Status:   Future    Standing Expiration Date:   02/28/2019  . Lactate dehydrogenase    Standing Status:   Future    Standing Expiration Date:   02/28/2019  . Protein electrophoresis, serum    Standing Status:   Future    Standing Expiration Date:   02/28/2019  . Ferritin    Standing Status:   Future    Standing Expiration Date:   02/28/2019  . IgG, IgA, IgM    Standing Status:   Future    Standing Expiration Date:   02/28/2019  . Kappa/lambda light chains    Standing Status:   Future    Standing Expiration Date:   02/28/2019       Zoila Shutter MD

## 2018-02-28 DIAGNOSIS — R140 Abdominal distension (gaseous): Secondary | ICD-10-CM | POA: Diagnosis not present

## 2018-02-28 DIAGNOSIS — R1010 Upper abdominal pain, unspecified: Secondary | ICD-10-CM | POA: Diagnosis not present

## 2018-02-28 DIAGNOSIS — K219 Gastro-esophageal reflux disease without esophagitis: Secondary | ICD-10-CM | POA: Diagnosis not present

## 2018-03-03 ENCOUNTER — Other Ambulatory Visit (HOSPITAL_COMMUNITY): Payer: Self-pay | Admitting: Internal Medicine

## 2018-03-03 DIAGNOSIS — N6452 Nipple discharge: Secondary | ICD-10-CM | POA: Diagnosis not present

## 2018-03-03 DIAGNOSIS — D242 Benign neoplasm of left breast: Secondary | ICD-10-CM

## 2018-03-03 DIAGNOSIS — D0511 Intraductal carcinoma in situ of right breast: Secondary | ICD-10-CM

## 2018-03-03 DIAGNOSIS — N2889 Other specified disorders of kidney and ureter: Secondary | ICD-10-CM | POA: Diagnosis not present

## 2018-03-03 DIAGNOSIS — Z86000 Personal history of in-situ neoplasm of breast: Secondary | ICD-10-CM | POA: Diagnosis not present

## 2018-03-03 DIAGNOSIS — N644 Mastodynia: Secondary | ICD-10-CM | POA: Diagnosis not present

## 2018-03-10 DIAGNOSIS — D3002 Benign neoplasm of left kidney: Secondary | ICD-10-CM | POA: Diagnosis not present

## 2018-03-19 ENCOUNTER — Ambulatory Visit (HOSPITAL_COMMUNITY): Payer: BLUE CROSS/BLUE SHIELD | Attending: Adult Health Nurse Practitioner | Admitting: Physical Therapy

## 2018-03-19 ENCOUNTER — Other Ambulatory Visit: Payer: Self-pay

## 2018-03-19 ENCOUNTER — Encounter (HOSPITAL_COMMUNITY): Payer: Self-pay | Admitting: Physical Therapy

## 2018-03-19 DIAGNOSIS — R262 Difficulty in walking, not elsewhere classified: Secondary | ICD-10-CM | POA: Insufficient documentation

## 2018-03-19 DIAGNOSIS — H8113 Benign paroxysmal vertigo, bilateral: Secondary | ICD-10-CM | POA: Insufficient documentation

## 2018-03-19 DIAGNOSIS — Z9181 History of falling: Secondary | ICD-10-CM | POA: Diagnosis present

## 2018-03-19 NOTE — Therapy (Signed)
[name], [place], [address] Phone: [phone]   Fax:  [phone]  Physical Therapy Evaluation  Patient Details  Name: Sarah Cooley MRN: [id number] Date of Birth: 09/09/1966 Referring Provider: Pablo Lawrence   Encounter Date: 03/19/2018  PT End of Session - 03/19/18 1623    Visit Number  1    Number of Visits  12    Date for PT Re-Evaluation  04/30/18    Authorization Type  BCBS    Authorization - Visit Number  1    Authorization - Number of Visits  30 30 visit limitation     PT Start Time  1525    PT Stop Time  1624    PT Time Calculation (min)  59 min    Activity Tolerance  Patient tolerated treatment well    Behavior During Therapy  [facility] for tasks assessed/performed       Past Medical History:  Diagnosis Date  . Allergy    seasonal  . Anxiety   . Arthritis   . Balance problem   . Brain aneurysm    2 mm ACA region aneurysm by 09/21/15 MRA  . Breast cancer ([place])   . Breast cancer of lower-inner quadrant of right female breast ([place]) 08/26/2015  . Chronic kidney disease    acute renal failure one occasion  . Complication of anesthesia    heart rate drops and O2 sats drop  . Constipation   . Depression   . Diabetes mellitus without complication ([place])    not on medication at this time  . Diplopia 10/29/2017  . Dizziness 10/29/2017  . Elevated liver function tests   . Family history of adverse reaction to anesthesia    mom has n/v  . Family history of breast cancer   . Family history of colon cancer   . Fibromyalgia   . Fibromyalgia   . Gait abnormality 10/29/2017  . Genetic testing 09/08/2015   Negative genetic testing on the Breast/High Moderate Risk panel. The Breast High/Moderate Risk gene panel offered by [employer] includes sequencing and deletion/duplication analysis of the following 9 genes: ATM, BRCA1, BRCA2, CDH1, CHEK2, PALB2, PTEN, STK11, and TP53. The report date is September 08, 2015.   The Rest of the Comprehensive Cancer panel has been reflexed and will be available in 2-3 weeks.  POLD1 c.208G>T VUS found on the Remainder of the Comprehensive cancer panel.  The Comprehensive Cancer Panel offered by [facility] includes sequencing and/or deletion duplication testing of the following 32 genes: APC, ATM, AXIN2, BARD1, BMPR1A, BRCA1, BRCA2, BRIP1, CDH1, CDK4, CDKN2A, CHEK2, EPCAM, FANCC, MLH1, MSH2, MSH6, MUTYH, NBN, PALB2, PMS2, POLD1, POLE, PTEN, RAD51C, RAD51D, SCG5/GREM1, SMAD4, STK11, TP53, VHL, and XRCC2.   The report date is 09/12/2015.   Marland Kitchen GERD (gastroesophageal reflux disease)    none recent  . H/O acute renal failure 09/22/15   admitted to [facility]  . Headache    occipital and temporal  . History of hiatal hernia   . Hyperlipemia   . Hyperlipidemia   . Hypertension   . Irritable bowel syndrome (IBS)   . MGUS (monoclonal gammopathy of unknown significance) 03/15/2016  . Neuromuscular disorder (HCC)    fibromyalgia  . Occasional tremors   . OSA (obstructive sleep apnea)    uses cpap with humidification  . PONV (postoperative nausea and vomiting)   . Tremor, essential 10/29/2017    Past Surgical History:  Procedure Laterality Date  . ABDOMINAL HYSTERECTOMY  adenomyosis  . ABDOMINAL HYSTERECTOMY    . BLADDER SURGERY    . BONE GRAFT HIP ILIAC CREST    . BREAST LUMPECTOMY    . BREAST SURGERY    . CHOLECYSTECTOMY    . FRACTURE SURGERY Right    wrist  . KNEE ARTHROSCOPY    . KNEE ARTHROSCOPY W/ ACL RECONSTRUCTION     right  . RADIOACTIVE SEED GUIDED PARTIAL MASTECTOMY WITH AXILLARY SENTINEL LYMPH NODE BIOPSY Right 10/13/2015   Procedure: RADIOACTIVE SEED GUIDED PARTIAL MASTECTOMY WITH AXILLARY SENTINEL LYMPH NODE BIOPSY;  Surgeon: Erroll Luna, MD;  Location: [facility];  Service: General;  Laterality: Right;  . WRIST SURGERY      There were no vitals filed for this visit.   Subjective Assessment - 03/19/18 1521    Subjective  Sarah Cooley states that  she has been having balance issues since November 2018.  She had not fallen but would have except that her husband walks with her and she hold onto him.  She was referred to [place] for balance therapy and had two treatments.  She did not continue due to financial matters.  In March she fell backward down a flight of stairs, hit her head and had LOC.  She has no idea why she fell she was at the top of the steps and just fell backward.  Ever since she has been moving very guarded and uses her huband for stability when she walks.  She does not feel safe in the shower thererfore she has purchased  a shower chair.  She states that  her vision is worse, she can not look up or bend over or roll in the bed.  Every once in a while when she is walking she has to stand still to make sure she is stable.  She still has headaches almost everyday.      Pertinent History  DX with breast and renal cell carcinoma, fibromyalgia, chronic fatigue, sleep apnea.     Limitations  Standing;Walking;House hold activities    How long can you sit comfortably?  no problem     How long can you stand comfortably?  15 minutes     How long can you walk comfortably?  15 minuts     Patient Stated Goals  help with the headache and the balance issues     Currently in Pain?  Yes Multiple areas but pt is not here for her fibromyalgia.          [facility] PT Assessment - 03/19/18 0001      Assessment   Medical Diagnosis  vertigo     Referring Provider  Pablo Lawrence    Onset Date/Surgical Date  09/05/17    Next MD Visit  not scheduled     Prior Therapy  [place]       Precautions   Precautions  Fall      Restrictions   Weight Bearing Restrictions  No      Balance Screen   Has the patient fallen in the past 6 months  Yes    How many times?  1    Has the patient had a decrease in activity level because of a fear of falling?   No    Is the patient reluctant to leave their home because of a fear of falling?   No      Prior  Function   Level of Independence  Independent      Cognition   Overall  Cognitive Status  Within Functional Limits for tasks assessed      Observation/Other Assessments   Focus on Therapeutic Outcomes (FOTO)   40           Vestibular Assessment - 03/19/18 0001      Symptom Behavior   Type of Dizziness  Not applicable      Occulomotor Exam   Occulomotor Alignment  Normal    Smooth Pursuits  Saccades right greater than left     Saccades  Poor trajectory;Slow      Vestibulo-Occular Reflex   VOR 2 Head and Object (x 2 viewing)  postive;       Positional Testing   Dix-Hallpike  Dix-Hallpike Right;Dix-Hallpike Left      Dix-Hallpike Right   Dix-Hallpike Right Symptoms  No nystagmus      Dix-Hallpike Left   Dix-Hallpike Left Symptoms  Upbeat, left rotatory nystagmus      Cognition   Cognition Orientation Level  Appropriate for developmental age      Positional Sensitivities   Right Hallpike  Severe dizziness    Up from Right Hallpike  Severe dizziness    Up from Left Hallpike  Severe dizziness          Objective measurements completed on examination: See above findings.       Vestibular Treatment/Exercise - 03/19/18 0001      Vestibular Treatment/Exercise   Vestibular Treatment Provided  Canalith Repositioning    Canalith Repositioning  Epley Manuever Left       EPLEY MANUEVER LEFT   Number of Reps   2    Overall Response   No change            PT Education - 03/19/18 1622    Education provided  Yes    Education Details  continue eye exercises given to pt at [facility]     Person(s) Educated  Patient    Methods  Explanation    Comprehension  Verbalized understanding       PT Short Term Goals - 03/19/18 1639      PT SHORT TERM GOAL #1   Title  Pt to be able to single leg stanc for 15 seconds bilaterally to reduce risk of falls     Time  3    Status  New    Target Date  04/02/18      PT SHORT TERM GOAL #2   Title  PT to have a negative  Dix  Halpike manuever to rule our BPPV for the cause of dizziness    Time  3    Period  Weeks    Status  New      PT SHORT TERM GOAL #3   Title  Pt to state that her lose of balance while walking has decreased by 50%     Time  3    Period  Weeks    Status  New      PT SHORT TERM GOAL #4   Title  PT headaches to have decreased to 3 a week     Time  3    Period  Weeks    Status  New        PT Long Term Goals - 03/19/18 1641      PT LONG TERM GOAL #1   Title  PT to be able to single leg stance for 30 seconds on both LE to improve confidence to be able to shower without using her shower chair.  Time  6    Period  Weeks    Status  New    Target Date  04/30/18      PT LONG TERM GOAL #2   Title  Pt to have had no headaches in the past week     Time  6    Period  Weeks    Status  New      PT LONG TERM GOAL #3   Title  Pt to be confident  walking by herself     Time  6    Period  Weeks    Status  New      PT LONG TERM GOAL #4   Title  Pt to state that her vision has improved by 50%    Time  6    Period  Weeks    Status  New             Plan - 03/19/18 1624    Clinical Impression Statement  Sarah Cooley is a 52 yo female who states that she has had difficulty with balance since November of 2018.  She has had numerous tests all of which are negative.  She was sent to [place] for gait and balance issues (stopped due to financial reasons).  She then fell down a flight of steps hitting her head and having LOC.  Since the fall she has had increased difficulty with dizziness while bending down, looking up or turning over in bed.  She has currently be referred to skilled physical therapy for vertigo.  Examination demonstrates positive saccades with smooth pursuit and VOR exercises indicative of central origin but also has a postitive LT [employer] indicative of BPPV.  Sarah Cooley will benefit from skilled physical therapy both for canalith repositioning,  habituation and balance training to decrease her risk of falls and improve her quality of life.      History and Personal Factors relevant to plan of care:  fall hitting head with LOC     Clinical Presentation  Evolving    Clinical Decision Making  High    Rehab Potential  Good    PT Frequency  2x / week    PT Duration  6 weeks    PT Treatment/Interventions  Canalith Repostioning;Gait training;Neuromuscular re-education;Balance training;Therapeutic exercise;Manual techniques    PT Next Visit Plan  Complete Rt and LT Dix halpike with cannalith repostitioning as needed; test SLS, 3 minute walk test, manual to cervical mm; give sitting and standing head turns for HEP      PT Home Exercise Plan  current eye exercises        Patient will benefit from skilled therapeutic intervention in order to improve the following deficits and impairments:  Abnormal gait, Decreased activity tolerance, Decreased balance, Dizziness, Difficulty walking  Visit Diagnosis: BPPV (benign paroxysmal positional vertigo), bilateral  Difficulty in walking, not elsewhere classified  History of falling     Problem List Patient Active Problem List   Diagnosis Date Noted  . Dizziness 10/29/2017  . Gait abnormality 10/29/2017  . Tremor, essential 10/29/2017  . Diplopia 10/29/2017  . Obstructive sleep apnea on CPAP 12/10/2016  . Plantar fasciitis, bilateral 12/10/2016  . Fibromyalgia 11/09/2016  . IBS (irritable bowel syndrome) 11/09/2016  . GERD (gastroesophageal reflux disease) 11/09/2016  . Anxiety and depression 11/09/2016  . MGUS (monoclonal gammopathy of unknown significance) 03/15/2016  . Breast cancer of lower-inner quadrant of right female breast ([place]) 08/26/2015  . Headache 07/17/2015  .  Hypertension 07/17/2015  . Obesity (BMI 35.0-39.9 without comorbidity) 07/17/2015    Rayetta Humphrey, PT CLT [phone] 03/19/2018, 4:47 PM  [facility] [address] [place], [place], [address] Phone: [phone]   Fax:  [phone]  Name: Sarah Cooley MRN: [id number] Date of Birth: Mar 02, 1966

## 2018-03-21 ENCOUNTER — Encounter (HOSPITAL_COMMUNITY): Payer: Self-pay

## 2018-03-21 ENCOUNTER — Ambulatory Visit (HOSPITAL_COMMUNITY): Payer: BLUE CROSS/BLUE SHIELD

## 2018-03-21 ENCOUNTER — Other Ambulatory Visit (HOSPITAL_COMMUNITY): Payer: Self-pay | Admitting: Internal Medicine

## 2018-03-21 DIAGNOSIS — H8113 Benign paroxysmal vertigo, bilateral: Secondary | ICD-10-CM | POA: Diagnosis not present

## 2018-03-21 DIAGNOSIS — Z9181 History of falling: Secondary | ICD-10-CM

## 2018-03-21 DIAGNOSIS — R262 Difficulty in walking, not elsewhere classified: Secondary | ICD-10-CM | POA: Diagnosis not present

## 2018-03-21 NOTE — Therapy (Signed)
[place], [place], [address] Phone: [phone]   Fax:  [phone]  Physical Therapy Treatment  Patient Details  Name: Sarah Cooley MRN: [id number] Date of Birth: [birth year] Referring Provider: Pablo Lawrence   Encounter Date: 03/21/2018  PT End of Session - 03/21/18 1746    Visit Number  2    Number of Visits  12    Date for PT Re-Evaluation  04/30/18    Authorization Type  BCBS    Authorization Time Period  5/29-->04/30/18    Authorization - Visit Number  2    Authorization - Number of Visits  30 30 visit limitation    PT Start Time  1740 pt a little late for apt    PT Stop Time  1823    PT Time Calculation (min)  43 min    Activity Tolerance  Patient tolerated treatment well    Behavior During Therapy  [facility] for tasks assessed/performed       Past Medical History:  Diagnosis Date  . Allergy    seasonal  . Anxiety   . Arthritis   . Balance problem   . Brain aneurysm    2 mm ACA region aneurysm by 09/21/15 MRA  . Breast cancer ([place])   . Breast cancer of lower-inner quadrant of right female breast ([place]) 08/26/2015  . Chronic kidney disease    acute renal failure one occasion  . Complication of anesthesia    heart rate drops and O2 sats drop  . Constipation   . Depression   . Diabetes mellitus without complication ([place])    not on medication at this time  . Diplopia 10/29/2017  . Dizziness 10/29/2017  . Elevated liver function tests   . Family history of adverse reaction to anesthesia    mom has n/v  . Family history of breast cancer   . Family history of colon cancer   . Fibromyalgia   . Fibromyalgia   . Gait abnormality 10/29/2017  . Genetic testing 09/08/2015   Negative genetic testing on the Breast/High Moderate Risk panel. The Breast High/Moderate Risk gene panel offered by [facility] includes sequencing and deletion/duplication analysis of the following 9 genes: ATM, BRCA1, BRCA2, CDH1, CHEK2,  PALB2, PTEN, STK11, and TP53. The report date is September 08, 2015.  The Rest of the Comprehensive Cancer panel has been reflexed and will be available in 2-3 weeks.  POLD1 c.208G>T VUS found on the Remainder of the Comprehensive cancer panel.  The Comprehensive Cancer Panel offered by [facility] includes sequencing and/or deletion duplication testing of the following 32 genes: APC, ATM, AXIN2, BARD1, BMPR1A, BRCA1, BRCA2, BRIP1, CDH1, CDK4, CDKN2A, CHEK2, EPCAM, FANCC, MLH1, MSH2, MSH6, MUTYH, NBN, PALB2, PMS2, POLD1, POLE, PTEN, RAD51C, RAD51D, SCG5/GREM1, SMAD4, STK11, TP53, VHL, and XRCC2.   The report date is 09/12/2015.   Marland Kitchen GERD (gastroesophageal reflux disease)    none recent  . H/O acute renal failure 09/22/15   admitted to [facility]  . Headache    occipital and temporal  . History of hiatal hernia   . Hyperlipemia   . Hyperlipidemia   . Hypertension   . Irritable bowel syndrome (IBS)   . MGUS (monoclonal gammopathy of unknown significance) 03/15/2016  . Neuromuscular disorder (HCC)    fibromyalgia  . Occasional tremors   . OSA (obstructive sleep apnea)    uses cpap with humidification  . PONV (postoperative nausea and vomiting)   . Tremor, essential 10/29/2017  Past Surgical History:  Procedure Laterality Date  . ABDOMINAL HYSTERECTOMY     adenomyosis  . ABDOMINAL HYSTERECTOMY    . BLADDER SURGERY    . BONE GRAFT HIP ILIAC CREST    . BREAST LUMPECTOMY    . BREAST SURGERY    . CHOLECYSTECTOMY    . FRACTURE SURGERY Right    wrist  . KNEE ARTHROSCOPY    . KNEE ARTHROSCOPY W/ ACL RECONSTRUCTION     right  . RADIOACTIVE SEED GUIDED PARTIAL MASTECTOMY WITH AXILLARY SENTINEL LYMPH NODE BIOPSY Right 10/13/2015   Procedure: RADIOACTIVE SEED GUIDED PARTIAL MASTECTOMY WITH AXILLARY SENTINEL LYMPH NODE BIOPSY;  Surgeon: Erroll Luna, MD;  Location: [facility];  Service: General;  Laterality: Right;  . WRIST SURGERY      There were no vitals filed for this visit.  Subjective  Assessment - 03/21/18 1742    Subjective  Pt stated she had some nausea following last session and continues to have headaches almost every day.  No reports of pain or recent fall    Pertinent History  DX with breast and renal cell carcinoma, fibromyalgia, chronic fatigue, sleep apnea.     Patient Stated Goals  help with the headache and the balance issues     Currently in Pain?  Yes         [facility] PT Assessment - 03/21/18 0001      Assessment   Medical Diagnosis  vertigo     Referring Provider  Pablo Lawrence    Onset Date/Surgical Date  09/05/17    Hand Dominance  Right    Next MD Visit  not scheduled, neurologist 04/09/18    Prior Therapy  [facility]       Precautions   Precautions  Fall      Ambulation/Gait   Ambulation Distance (Feet)  572 Feet    Assistive device  None    Gait Pattern  Within Functional Limits slow/guards and stiff especially with corners    Ambulation Surface  Level;Indoor    Gait Comments  3MWT         Vestibular Assessment - 03/21/18 0001      Occulomotor Exam   Smooth Pursuits  Saccades Rt > Lt    Saccades  Poor trajectory;Slow                Vestibular Treatment/Exercise - 03/21/18 0001      Vestibular Treatment/Exercise   Vestibular Treatment Provided  Habituation    Habituation Exercises  Seated Horizontal Head Turns;Seated Vertical Head Turns;Seated Diagonal Head Turns    Gaze Exercises  X1 Viewing Horizontal;X1 Viewing Vertical;Eye/Head Exercise Horizontal;Eye/Head Exercise Vertical      Seated Horizontal Head Turns   Number of Reps   5    Symptom Description   Reports double vision with head turns to Lt not Rt      Seated Vertical Head Turns   Number of Reps   5    Symptom Description   no symptoms      Seated Diagonal Head Turns   Number of Reps  5    Symptoms Description   no symptoms      X1 Viewing Horizontal   Foot Position  seated    Reps  5      X1 Viewing Vertical   Foot Position  seated    Reps  5       Eye/Head Exercise Horizontal   Foot Position  seated    Reps  5  Comments  reports double vision when looks to Lt, not rigth      Eye/Head Exercise Vertical   Foot Position  seated    Reps  5    Comments  no symptoms, pt hesitant to look up         Balance Exercises - 03/21/18 1829      Balance Exercises: Standing   SLS  Eyes open;Solid surface Rt 60", Lt 33" max of 2        PT Education - 03/21/18 1835    Education provided  Yes    Education Details  Reviewed goals and copy of eval given to pt.  Reviewed eye exercises and added headmovements to HEP.      Person(s) Educated  Patient    Methods  Explanation;Demonstration;Verbal cues    Comprehension  Verbalized understanding;Returned demonstration       PT Short Term Goals - 03/19/18 1639      PT SHORT TERM GOAL #1   Title  Pt to be able to single leg stanc for 15 seconds bilaterally to reduce risk of falls     Time  3    Status  New    Target Date  04/02/18      PT SHORT TERM GOAL #2   Title  PT to have a negative Dix  Halpike manuever to rule our BPPV for the cause of dizziness    Time  3    Period  Weeks    Status  New      PT SHORT TERM GOAL #3   Title  Pt to state that her lose of balance while walking has decreased by 50%     Time  3    Period  Weeks    Status  New      PT SHORT TERM GOAL #4   Title  PT headaches to have decreased to 3 a week     Time  3    Period  Weeks    Status  New        PT Long Term Goals - 03/19/18 1641      PT LONG TERM GOAL #1   Title  PT to be able to single leg stance for 30 seconds on both LE to improve confidence to be able to shower without using her shower chair.     Time  6    Period  Weeks    Status  New    Target Date  04/30/18      PT LONG TERM GOAL #2   Title  Pt to have had no headaches in the past week     Time  6    Period  Weeks    Status  New      PT LONG TERM GOAL #3   Title  Pt to be confident  walking by herself     Time  6    Period   Weeks    Status  New      PT LONG TERM GOAL #4   Title  Pt to state that her vision has improved by 50%    Time  6    Period  Weeks    Status  New            Plan - 03/21/18 1829    Clinical Impression Statement  Reviewed goals and copy of eval given to pt.  Pt reports increased nausea following last session but has no dizzy s/s  today.  Reviewed eye movements HEP and added head movements to HEP.  Noted nystagmus with horizontal eye and head movements Rt> Lt and reports of double vision when turns head to Lt.  No dix halpike complete this session due to no s/s of dizziness today, do .  3MWT complete with noted slow and guarded movements especially with corners, no LOB episdes though appears unsteady with task.  EOS with manual soft tissue mobilization to address restrictions in cervical musulature.  Able to passively rotate head both directions during massage and no reoprts of s/s.      Rehab Potential  Good    PT Frequency  2x / week    PT Duration  6 weeks    PT Treatment/Interventions  Canalith Repostioning;Gait training;Neuromuscular re-education;Balance training;Therapeutic exercise;Manual techniques    PT Next Visit Plan  Complete Rt and LT Dix halpike with cannalith repostitioning as needed; manual to cervical mm; Begin standing head turns for HEP next session    PT Home Exercise Plan  current eye exercises; head movements       Patient will benefit from skilled therapeutic intervention in order to improve the following deficits and impairments:  Abnormal gait, Decreased activity tolerance, Decreased balance, Dizziness, Difficulty walking  Visit Diagnosis: BPPV (benign paroxysmal positional vertigo), bilateral  Difficulty in walking, not elsewhere classified  History of falling     Problem List Patient Active Problem List   Diagnosis Date Noted  . Dizziness 10/29/2017  . Gait abnormality 10/29/2017  . Tremor, essential 10/29/2017  . Diplopia 10/29/2017  .  Obstructive sleep apnea on CPAP 12/10/2016  . Plantar fasciitis, bilateral 12/10/2016  . Fibromyalgia 11/09/2016  . IBS (irritable bowel syndrome) 11/09/2016  . GERD (gastroesophageal reflux disease) 11/09/2016  . Anxiety and depression 11/09/2016  . MGUS (monoclonal gammopathy of unknown significance) 03/15/2016  . Breast cancer of lower-inner quadrant of right female breast ([place]) 08/26/2015  . Headache 07/17/2015  . Hypertension 07/17/2015  . Obesity (BMI 35.0-39.9 without comorbidity) 07/17/2015   Ihor Austin, [place]; [place]  Aldona Lento 03/21/2018, 6:36 PM  [place], [place], [address] Phone: [phone]   Fax:  [phone]  Name: Sarah Cooley MRN: [id number] Date of Birth: 1966-03-16

## 2018-03-24 ENCOUNTER — Ambulatory Visit: Payer: BLUE CROSS/BLUE SHIELD

## 2018-03-24 ENCOUNTER — Ambulatory Visit
Admission: RE | Admit: 2018-03-24 | Discharge: 2018-03-24 | Disposition: A | Discharge status: Home / Self Care | Payer: BLUE CROSS/BLUE SHIELD | Source: Ambulatory Visit | Attending: Internal Medicine | Admitting: Internal Medicine

## 2018-03-24 ENCOUNTER — Other Ambulatory Visit: Payer: BLUE CROSS/BLUE SHIELD

## 2018-03-24 DIAGNOSIS — D242 Benign neoplasm of left breast: Secondary | ICD-10-CM

## 2018-03-24 DIAGNOSIS — K2930 Chronic superficial gastritis without bleeding: Secondary | ICD-10-CM | POA: Diagnosis not present

## 2018-03-24 DIAGNOSIS — R928 Other abnormal and inconclusive findings on diagnostic imaging of breast: Secondary | ICD-10-CM | POA: Diagnosis not present

## 2018-03-24 DIAGNOSIS — D0511 Intraductal carcinoma in situ of right breast: Secondary | ICD-10-CM

## 2018-03-24 DIAGNOSIS — R140 Abdominal distension (gaseous): Secondary | ICD-10-CM | POA: Diagnosis not present

## 2018-03-24 DIAGNOSIS — R1013 Epigastric pain: Secondary | ICD-10-CM | POA: Diagnosis not present

## 2018-03-24 HISTORY — PX: ESOPHAGOGASTRODUODENOSCOPY (EGD) WITH PROPOFOL: SHX5813

## 2018-03-24 HISTORY — DX: Personal history of irradiation: Z92.3

## 2018-03-25 ENCOUNTER — Ambulatory Visit (HOSPITAL_COMMUNITY): Payer: BLUE CROSS/BLUE SHIELD | Attending: Adult Health Nurse Practitioner

## 2018-03-25 ENCOUNTER — Encounter (HOSPITAL_COMMUNITY): Payer: Self-pay

## 2018-03-25 DIAGNOSIS — H8113 Benign paroxysmal vertigo, bilateral: Secondary | ICD-10-CM | POA: Insufficient documentation

## 2018-03-25 DIAGNOSIS — R262 Difficulty in walking, not elsewhere classified: Secondary | ICD-10-CM

## 2018-03-25 DIAGNOSIS — Z9181 History of falling: Secondary | ICD-10-CM | POA: Diagnosis present

## 2018-03-25 NOTE — Therapy (Signed)
[facility] [address] [place], [place], [address] Phone: [phone]   Fax:  [phone]  Physical Therapy Treatment  Patient Details  Name: Sarah Cooley MRN: [id number] Date of Birth: 08/26/1966 Referring Provider: Courtney Keatts   Encounter Date: 03/25/2018  PT End of Session - 03/25/18 1747    Visit Number  3    Number of Visits  12    Date for PT Re-Evaluation  04/30/18    Authorization Type  BCBS    Authorization Time Period  5/29-->04/30/18    Authorization - Visit Number  3    Authorization - Number of Visits  30 30 visit limitation    PT Start Time  1741    PT Stop Time  1820    PT Time Calculation (min)  39 min    Activity Tolerance  Patient tolerated treatment well    Behavior During Therapy  WFL for tasks assessed/performed       Past Medical History:  Diagnosis Date  . Allergy    seasonal  . Anxiety   . Arthritis   . Balance problem   . Brain aneurysm    2 mm ACA region aneurysm by 09/21/15 MRA  . Breast cancer (HCC)   . Breast cancer of lower-inner quadrant of right female breast (HCC) 08/26/2015  . Chronic kidney disease    acute renal failure one occasion  . Complication of anesthesia    heart rate drops and O2 sats drop  . Constipation   . Depression   . Diabetes mellitus without complication (HCC)    not on medication at this time  . Diplopia 10/29/2017  . Dizziness 10/29/2017  . Elevated liver function tests   . Family history of adverse reaction to anesthesia    mom has n/v  . Family history of breast cancer   . Family history of colon cancer   . Fibromyalgia   . Fibromyalgia   . Gait abnormality 10/29/2017  . Genetic testing 09/08/2015   Negative genetic testing on the Breast/High Moderate Risk panel. The Breast High/Moderate Risk gene panel offered by [facility] includes sequencing and deletion/duplication analysis of the following 9 genes: ATM, BRCA1, BRCA2, CDH1, CHEK2, PALB2, PTEN, STK11, and  TP53. The report date is September 08, 2015.  The Rest of the Comprehensive Cancer panel has been reflexed and will be available in 2-3 weeks.  POLD1 c.208G>T VUS found on the Remainder of the Comprehensive cancer panel.  The Comprehensive Cancer Panel offered by [facility] includes sequencing and/or deletion duplication testing of the following 32 genes: APC, ATM, AXIN2, BARD1, BMPR1A, BRCA1, BRCA2, BRIP1, CDH1, CDK4, CDKN2A, CHEK2, EPCAM, FANCC, MLH1, MSH2, MSH6, MUTYH, NBN, PALB2, PMS2, POLD1, POLE, PTEN, RAD51C, RAD51D, SCG5/GREM1, SMAD4, STK11, TP53, VHL, and XRCC2.   The report date is 09/12/2015.   . GERD (gastroesophageal reflux disease)    none recent  . H/O acute renal failure 09/22/15   admitted to [facility]  . Headache    occipital and temporal  . History of hiatal hernia   . Hyperlipemia   . Hyperlipidemia   . Hypertension   . Irritable bowel syndrome (IBS)   . MGUS (monoclonal gammopathy of unknown significance) 03/15/2016  . Neuromuscular disorder (HCC)    fibromyalgia  . Occasional tremors   . OSA (obstructive sleep apnea)    uses cpap with humidification  . Personal history of radiation therapy   . PONV (postoperative nausea and vomiting)   . Tremor, essential   10/29/2017    Past Surgical History:  Procedure Laterality Date  . ABDOMINAL HYSTERECTOMY     adenomyosis  . ABDOMINAL HYSTERECTOMY    . BLADDER SURGERY    . BONE GRAFT HIP ILIAC CREST    . BREAST BIOPSY Bilateral   . BREAST LUMPECTOMY    . BREAST SURGERY    . CHOLECYSTECTOMY    . FRACTURE SURGERY Right    wrist  . KNEE ARTHROSCOPY    . KNEE ARTHROSCOPY W/ ACL RECONSTRUCTION     right  . RADIOACTIVE SEED GUIDED PARTIAL MASTECTOMY WITH AXILLARY SENTINEL LYMPH NODE BIOPSY Right 10/13/2015   Procedure: RADIOACTIVE SEED GUIDED PARTIAL MASTECTOMY WITH AXILLARY SENTINEL LYMPH NODE BIOPSY;  Surgeon: Thomas [name], MD;  Location: [facility] OR;  Service: General;  Laterality: Right;  . WRIST SURGERY      There were  no vitals filed for this visit.  Subjective Assessment - 03/25/18 1742    Subjective  Pt stated there has just been one thing after the next, had mammogram and endoscopy yesterday with good reports on mammogram, some GI inflammation.  Currently has headache posterior Rt, reports relief following last session but has returned.    Pertinent History  DX with breast and renal cell carcinoma, fibromyalgia, chronic fatigue, sleep apnea.     Patient Stated Goals  help with the headache and the balance issues     Currently in Pain?  Yes    Pain Score  8     Pain Location  Abdomen    Pain Orientation  Left;Right    Pain Descriptors / Indicators  Dull;Aching;Burning    Pain Type  Chronic pain    Multiple Pain Sites  Yes    Pain Score  5    Pain Location  Head    Pain Orientation  Right;Posterior    Pain Type  Chronic pain    Pain Frequency  Intermittent                        Vestibular Treatment/Exercise - 03/25/18 0001      Vestibular Treatment/Exercise   Vestibular Treatment Provided  Habituation;Gaze    Habituation Exercises  Seated Diagonal Head Turns;Standing Horizontal Head Turns;Standing Vertical Head Turns;Standing Diagonal Head Turns    Gaze Exercises  Eye/Head Exercise Horizontal;Eye/Head Exercise Vertical      Standing Horizontal Head Turns   Number of Reps   10    Symptom Description   5" dizziness to Lt       Standing Vertical Head Turns   Number of Reps   10    Symptom Description   no s/s; tendency to lean posterior          Balance Exercises - 03/25/18 1804      Balance Exercises: Standing   Tandem Stance  Eyes open;2 reps;30 secs 2nd set with foam; progress to head turns next session    SLS  3 reps Rt 34", Lt 31"          PT Short Term Goals - 03/19/18 1639      PT SHORT TERM GOAL #1   Title  Pt to be able to single leg stanc for 15 seconds bilaterally to reduce risk of falls     Time  3    Status  New    Target Date  04/02/18       PT SHORT TERM GOAL #2   Title  PT to have a negative Dix    Halpike manuever to rule our BPPV for the cause of dizziness    Time  3    Period  Weeks    Status  New      PT SHORT TERM GOAL #3   Title  Pt to state that her lose of balance while walking has decreased by 50%     Time  3    Period  Weeks    Status  New      PT SHORT TERM GOAL #4   Title  PT headaches to have decreased to 3 a week     Time  3    Period  Weeks    Status  New        PT Long Term Goals - 03/19/18 1641      PT LONG TERM GOAL #1   Title  PT to be able to single leg stance for 30 seconds on both LE to improve confidence to be able to shower without using her shower chair.     Time  6    Period  Weeks    Status  New    Target Date  04/30/18      PT LONG TERM GOAL #2   Title  Pt to have had no headaches in the past week     Time  6    Period  Weeks    Status  New      PT LONG TERM GOAL #3   Title  Pt to be confident  walking by herself     Time  6    Period  Weeks    Status  New      PT LONG TERM GOAL #4   Title  Pt to state that her vision has improved by 50%    Time  6    Period  Weeks    Status  New            Plan - 03/25/18 1824    Clinical Impression Statement  Pt arrived with no reports of dizziness and reduced nystagmus with gazing exercises noted.  Progressed to standing eye/head movements in NBOS for balance training.  1 episode of dizzyness with eye movements to the Lt that was resolved in less than 5".  Was able to progress to tandem stance exercises for balance training.  Pt with tendency to lean posterior, educated on proper weight bearing to assist.  EOS with manual to reduce soft tissue restrictions, reports of relief of headaches at EOS.  Encouraged pt to stay hydrated following manual.      Rehab Potential  Good    PT Frequency  2x / week    PT Duration  6 weeks    PT Treatment/Interventions  Canalith Repostioning;Gait training;Neuromuscular re-education;Balance  training;Therapeutic exercise;Manual techniques    PT Next Visit Plan  Complete Rt and LT Dix halpike with cannalith repostitioning as needed; manual to cervical mm; Next session begin tandem with eye movements and progress to head turns as able.      PT Home Exercise Plan  current eye exercises; head movements; standing eye and head movements       Patient will benefit from skilled therapeutic intervention in order to improve the following deficits and impairments:  Abnormal gait, Decreased activity tolerance, Decreased balance, Dizziness, Difficulty walking  Visit Diagnosis: BPPV (benign paroxysmal positional vertigo), bilateral  Difficulty in walking, not elsewhere classified  History of falling     Problem List Patient Active Problem List  Diagnosis Date Noted  . Dizziness 10/29/2017  . Gait abnormality 10/29/2017  . Tremor, essential 10/29/2017  . Diplopia 10/29/2017  . Obstructive sleep apnea on CPAP 12/10/2016  . Plantar fasciitis, bilateral 12/10/2016  . Fibromyalgia 11/09/2016  . IBS (irritable bowel syndrome) 11/09/2016  . GERD (gastroesophageal reflux disease) 11/09/2016  . Anxiety and depression 11/09/2016  . MGUS (monoclonal gammopathy of unknown significance) 03/15/2016  . Breast cancer of lower-inner quadrant of right female breast (HCC) 08/26/2015  . Headache 07/17/2015  . Hypertension 07/17/2015  . Obesity (BMI 35.0-39.9 without comorbidity) 07/17/2015    , LPTA; CBIS [phone]  ,  Jo 03/25/2018, 6:29 PM  [facility] [address] [place], [place], [address] Phone: [phone]   Fax:  [phone]  Name: Jazman H Fronek MRN: [id number] Date of Birth: 01/26/1966   

## 2018-03-27 ENCOUNTER — Encounter (HOSPITAL_COMMUNITY): Payer: Self-pay

## 2018-03-27 ENCOUNTER — Ambulatory Visit (HOSPITAL_COMMUNITY): Payer: BLUE CROSS/BLUE SHIELD

## 2018-03-27 DIAGNOSIS — Z9181 History of falling: Secondary | ICD-10-CM | POA: Diagnosis not present

## 2018-03-27 DIAGNOSIS — R262 Difficulty in walking, not elsewhere classified: Secondary | ICD-10-CM

## 2018-03-27 DIAGNOSIS — H8113 Benign paroxysmal vertigo, bilateral: Secondary | ICD-10-CM | POA: Diagnosis not present

## 2018-03-27 DIAGNOSIS — K2930 Chronic superficial gastritis without bleeding: Secondary | ICD-10-CM | POA: Diagnosis not present

## 2018-03-27 NOTE — Therapy (Signed)
[place], [place], [address] Phone: [phone]   Fax:  [phone]  Physical Therapy Treatment  Patient Details  Name: Sarah Cooley MRN: [id number] Date of Birth: May 21, 1966 Referring Provider: Pablo Lawrence   Encounter Date: 03/27/2018  PT End of Session - 03/27/18 1745    Visit Number  4    Number of Visits  12    Date for PT Re-Evaluation  04/30/18    Authorization Type  BCBS    Authorization Time Period  5/29-->04/30/18    Authorization - Visit Number  4    Authorization - Number of Visits  30 30 visit limitation    PT Start Time  1733    PT Stop Time  1820    PT Time Calculation (min)  47 min    Activity Tolerance  Patient tolerated treatment well    Behavior During Therapy  [facility] for tasks assessed/performed       Past Medical History:  Diagnosis Date  . Allergy    seasonal  . Anxiety   . Arthritis   . Balance problem   . Brain aneurysm    2 mm ACA region aneurysm by 09/21/15 MRA  . Breast cancer ([place])   . Breast cancer of lower-inner quadrant of right female breast ([place]) 08/26/2015  . Chronic kidney disease    acute renal failure one occasion  . Complication of anesthesia    heart rate drops and O2 sats drop  . Constipation   . Depression   . Diabetes mellitus without complication ([place])    not on medication at this time  . Diplopia 10/29/2017  . Dizziness 10/29/2017  . Elevated liver function tests   . Family history of adverse reaction to anesthesia    mom has n/v  . Family history of breast cancer   . Family history of colon cancer   . Fibromyalgia   . Fibromyalgia   . Gait abnormality 10/29/2017  . Genetic testing 09/08/2015   Negative genetic testing on the Breast/High Moderate Risk panel. The Breast High/Moderate Risk gene panel offered by [facility] includes sequencing and deletion/duplication analysis of the following 9 genes: ATM, BRCA1, BRCA2, CDH1, CHEK2, PALB2, PTEN, STK11, and  TP53. The report date is September 08, 2015.  The Rest of the Comprehensive Cancer panel has been reflexed and will be available in 2-3 weeks.  POLD1 c.208G>T VUS found on the Remainder of the Comprehensive cancer panel.  The Comprehensive Cancer Panel offered by [facility] includes sequencing and/or deletion duplication testing of the following 32 genes: APC, ATM, AXIN2, BARD1, BMPR1A, BRCA1, BRCA2, BRIP1, CDH1, CDK4, CDKN2A, CHEK2, EPCAM, FANCC, MLH1, MSH2, MSH6, MUTYH, NBN, PALB2, PMS2, POLD1, POLE, PTEN, RAD51C, RAD51D, SCG5/GREM1, SMAD4, STK11, TP53, VHL, and XRCC2.   The report date is 09/12/2015.   Marland Kitchen GERD (gastroesophageal reflux disease)    none recent  . H/O acute renal failure 09/22/15   admitted to [facility]  . Headache    occipital and temporal  . History of hiatal hernia   . Hyperlipemia   . Hyperlipidemia   . Hypertension   . Irritable bowel syndrome (IBS)   . MGUS (monoclonal gammopathy of unknown significance) 03/15/2016  . Neuromuscular disorder (HCC)    fibromyalgia  . Occasional tremors   . OSA (obstructive sleep apnea)    uses cpap with humidification  . Personal history of radiation therapy   . PONV (postoperative nausea and vomiting)   . Tremor, essential  10/29/2017    Past Surgical History:  Procedure Laterality Date  . ABDOMINAL HYSTERECTOMY     adenomyosis  . ABDOMINAL HYSTERECTOMY    . BLADDER SURGERY    . BONE GRAFT HIP ILIAC CREST    . BREAST BIOPSY Bilateral   . BREAST LUMPECTOMY    . BREAST SURGERY    . CHOLECYSTECTOMY    . FRACTURE SURGERY Right    wrist  . KNEE ARTHROSCOPY    . KNEE ARTHROSCOPY W/ ACL RECONSTRUCTION     right  . RADIOACTIVE SEED GUIDED PARTIAL MASTECTOMY WITH AXILLARY SENTINEL LYMPH NODE BIOPSY Right 10/13/2015   Procedure: RADIOACTIVE SEED GUIDED PARTIAL MASTECTOMY WITH AXILLARY SENTINEL LYMPH NODE BIOPSY;  Surgeon: Erroll Luna, MD;  Location: [facility];  Service: General;  Laterality: Right;  . WRIST SURGERY      There were  no vitals filed for this visit.  Subjective Assessment - 03/27/18 1736    Subjective  Pt stated she had increased pain her whole body Wednesday, blamming it on her fibro.  Pt continues to have occassional dizzy episodes, for a couple of seconds then resolved.  No reports of dizziness currently, head feels heavy.  Pt has been hitting objects when walking, no report of recent fall.  Has began the gazing exercises standing at home.    Pertinent History  DX with breast and renal cell carcinoma, fibromyalgia, chronic fatigue, sleep apnea.     Patient Stated Goals  help with the headache and the balance issues     Currently in Pain?  Yes    Pain Score  4     Pain Location  Head    Pain Orientation  Posterior;Right    Pain Descriptors / Indicators  Aching    Pain Onset  More than a month ago headache since fall in March 30th    Pain Frequency  Intermittent    Aggravating Factors   bright lights    Pain Relieving Factors  light dimmer, advil                       [facility] Adult PT Treatment/Exercise - 03/27/18 0001      Manual Therapy   Manual Therapy  Soft tissue mobilization  (Pended)       Vestibular Treatment/Exercise - 03/27/18 0001      Vestibular Treatment/Exercise   Vestibular Treatment Provided  Habituation;Gaze    Gaze Exercises  Eye/Head Exercise Horizontal;Eye/Head Exercise Vertical      Standing Horizontal Head Turns   Number of Reps   10    Symptom Description   tandem stance 1st static then balance beam      Standing Vertical Head Turns   Number of Reps   10    Symptom Description   tandem stance 1st static then balance beam; no s/s tendencey to lean posteriorr      X1 Viewing Horizontal   Foot Position  tandem stance 1st static then balance beam, cueing to reduce posterior lean    Reps  10      X1 Viewing Vertical   Foot Position  tandem stance 1st static then balance beam, cueing to reduce posterior lean    Reps  10      Eye/Head Exercise Horizontal    Foot Position  tandem stance 1st static then balance beam, cueing to reduce posterior lean    Reps  10    Comments  no s/s      Eye/Head Exercise  Vertical   Foot Position  tandem stance 1st static then balance beam, cueing to reduce posterior lean    Reps  10    Comments  no s/s         Balance Exercises - 03/27/18 1747      Balance Exercises: Standing   Tandem Stance  Eyes open;2 reps eye movements then head movements           PT Short Term Goals - 03/19/18 1639      PT SHORT TERM GOAL #1   Title  Pt to be able to single leg stanc for 15 seconds bilaterally to reduce risk of falls     Time  3    Status  New    Target Date  04/02/18      PT SHORT TERM GOAL #2   Title  PT to have a negative Dix  Halpike manuever to rule our BPPV for the cause of dizziness    Time  3    Period  Weeks    Status  New      PT SHORT TERM GOAL #3   Title  Pt to state that her lose of balance while walking has decreased by 50%     Time  3    Period  Weeks    Status  New      PT SHORT TERM GOAL #4   Title  PT headaches to have decreased to 3 a week     Time  3    Period  Weeks    Status  New        PT Long Term Goals - 03/19/18 1641      PT LONG TERM GOAL #1   Title  PT to be able to single leg stance for 30 seconds on both LE to improve confidence to be able to shower without using her shower chair.     Time  6    Period  Weeks    Status  New    Target Date  04/30/18      PT LONG TERM GOAL #2   Title  Pt to have had no headaches in the past week     Time  6    Period  Weeks    Status  New      PT LONG TERM GOAL #3   Title  Pt to be confident  walking by herself     Time  6    Period  Weeks    Status  New      PT LONG TERM GOAL #4   Title  Pt to state that her vision has improved by 50%    Time  6    Period  Weeks    Status  New            Plan - 03/27/18 1752    Clinical Impression Statement  Pt arrived with no reoprts of dizziness currently and  reports reduction of frequency and intensity.  Does continue to have difficulty with concentation and forgetfullness.  Session focus on balance training with gazing exercises, progressed to eye/head movements in NBOS/tandem stance.  No reports of dizziness through session and minimal nystagmus Bil eyes with head turns to the Lt.  Pt with tendencey to lean posterior more to Lt then Rt, verbal and tactile cueing to improve awareness and improve weight bearing to reduce lean.  EOS with manual to address soft tissue restrictions, no report of headache  or dizziness at end.      PT Treatment/Interventions  Canalith Repostioning;Gait training;Neuromuscular re-education;Balance training;Therapeutic exercise;Manual techniques    PT Next Visit Plan  Complete Rt and LT Dix halpike with cannalith repostitioning as needed; manual to cervical mm; Next session begin STS with laser light and progress to gait traingin wtih eye movements then head movements as able.       PT Home Exercise Plan  current eye exercises; head movements; standing eye and head movements       Patient will benefit from skilled therapeutic intervention in order to improve the following deficits and impairments:  Abnormal gait, Decreased activity tolerance, Decreased balance, Dizziness, Difficulty walking  Visit Diagnosis: Difficulty in walking, not elsewhere classified  BPPV (benign paroxysmal positional vertigo), bilateral  History of falling     Problem List Patient Active Problem List   Diagnosis Date Noted  . Dizziness 10/29/2017  . Gait abnormality 10/29/2017  . Tremor, essential 10/29/2017  . Diplopia 10/29/2017  . Obstructive sleep apnea on CPAP 12/10/2016  . Plantar fasciitis, bilateral 12/10/2016  . Fibromyalgia 11/09/2016  . IBS (irritable bowel syndrome) 11/09/2016  . GERD (gastroesophageal reflux disease) 11/09/2016  . Anxiety and depression 11/09/2016  . MGUS (monoclonal gammopathy of unknown significance)  03/15/2016  . Breast cancer of lower-inner quadrant of right female breast ([place]) 08/26/2015  . Headache 07/17/2015  . Hypertension 07/17/2015  . Obesity (BMI 35.0-39.9 without comorbidity) 07/17/2015   Ihor Austin, [place]; [place]  Aldona Lento 03/27/2018, 6:27 PM  [place], [place], [address] Phone: [phone]   Fax:  [phone]  Name: Sarah Cooley MRN: [id number] Date of Birth: Sep 15, 1966

## 2018-04-02 ENCOUNTER — Ambulatory Visit (HOSPITAL_COMMUNITY): Payer: BLUE CROSS/BLUE SHIELD

## 2018-04-02 ENCOUNTER — Encounter (HOSPITAL_COMMUNITY): Payer: Self-pay

## 2018-04-02 DIAGNOSIS — H8113 Benign paroxysmal vertigo, bilateral: Secondary | ICD-10-CM

## 2018-04-02 DIAGNOSIS — R262 Difficulty in walking, not elsewhere classified: Secondary | ICD-10-CM

## 2018-04-02 DIAGNOSIS — Z9181 History of falling: Secondary | ICD-10-CM | POA: Diagnosis not present

## 2018-04-02 NOTE — Therapy (Addendum)
[place], [place], [address] Phone: [phone]   Fax:  [phone]  Physical Therapy Treatment  Patient Details  Name: Sarah Cooley MRN: [id number] Date of Birth: 1965/12/14 Referring Provider: Pablo Lawrence   Encounter Date: 04/02/2018  PT End of Session - 04/02/18 1745    Visit Number  5    Number of Visits  12    Date for PT Re-Evaluation  04/30/18    Authorization Type  BCBS    Authorization Time Period  5/29-->04/30/18    Authorization - Visit Number  5    Authorization - Number of Visits  30 30 visit limit    PT Start Time  9678 pt late for apt     PT Stop Time  1822    PT Time Calculation (min)  40 min    Activity Tolerance  Patient tolerated treatment well;No increased pain    Behavior During Therapy  WFL for tasks assessed/performed       Past Medical History:  Diagnosis Date  . Allergy    seasonal  . Anxiety   . Arthritis   . Balance problem   . Brain aneurysm    2 mm ACA region aneurysm by 09/21/15 MRA  . Breast cancer ([place])   . Breast cancer of lower-inner quadrant of right female breast ([place]) 08/26/2015  . Chronic kidney disease    acute renal failure one occasion  . Complication of anesthesia    heart rate drops and O2 sats drop  . Constipation   . Depression   . Diabetes mellitus without complication ([place])    not on medication at this time  . Diplopia 10/29/2017  . Dizziness 10/29/2017  . Elevated liver function tests   . Family history of adverse reaction to anesthesia    mom has n/v  . Family history of breast cancer   . Family history of colon cancer   . Fibromyalgia   . Fibromyalgia   . Gait abnormality 10/29/2017  . Genetic testing 09/08/2015   Negative genetic testing on the Breast/High Moderate Risk panel. The Breast High/Moderate Risk gene panel offered by [facility] includes sequencing and deletion/duplication analysis of the following 9 genes: ATM, BRCA1, BRCA2, CDH1,  CHEK2, PALB2, PTEN, STK11, and TP53. The report date is September 08, 2015.  The Rest of the Comprehensive Cancer panel has been reflexed and will be available in 2-3 weeks.  POLD1 c.208G>T VUS found on the Remainder of the Comprehensive cancer panel.  The Comprehensive Cancer Panel offered by [facility] includes sequencing and/or deletion duplication testing of the following 32 genes: APC, ATM, AXIN2, BARD1, BMPR1A, BRCA1, BRCA2, BRIP1, CDH1, CDK4, CDKN2A, CHEK2, EPCAM, FANCC, MLH1, MSH2, MSH6, MUTYH, NBN, PALB2, PMS2, POLD1, POLE, PTEN, RAD51C, RAD51D, SCG5/GREM1, SMAD4, STK11, TP53, VHL, and XRCC2.   The report date is 09/12/2015.   Marland Kitchen GERD (gastroesophageal reflux disease)    none recent  . H/O acute renal failure 09/22/15   admitted to [facility]  . Headache    occipital and temporal  . History of hiatal hernia   . Hyperlipemia   . Hyperlipidemia   . Hypertension   . Irritable bowel syndrome (IBS)   . MGUS (monoclonal gammopathy of unknown significance) 03/15/2016  . Neuromuscular disorder (HCC)    fibromyalgia  . Occasional tremors   . OSA (obstructive sleep apnea)    uses cpap with humidification  . Personal history of radiation therapy   . PONV (postoperative nausea  and vomiting)   . Tremor, essential 10/29/2017    Past Surgical History:  Procedure Laterality Date  . ABDOMINAL HYSTERECTOMY     adenomyosis  . ABDOMINAL HYSTERECTOMY    . BLADDER SURGERY    . BONE GRAFT HIP ILIAC CREST    . BREAST BIOPSY Bilateral   . BREAST LUMPECTOMY    . BREAST SURGERY    . CHOLECYSTECTOMY    . FRACTURE SURGERY Right    wrist  . KNEE ARTHROSCOPY    . KNEE ARTHROSCOPY W/ ACL RECONSTRUCTION     right  . RADIOACTIVE SEED GUIDED PARTIAL MASTECTOMY WITH AXILLARY SENTINEL LYMPH NODE BIOPSY Right 10/13/2015   Procedure: RADIOACTIVE SEED GUIDED PARTIAL MASTECTOMY WITH AXILLARY SENTINEL LYMPH NODE BIOPSY;  Surgeon: Erroll Luna, MD;  Location: [facility];  Service: General;  Laterality: Right;  .  WRIST SURGERY      There were no vitals filed for this visit.  Subjective Assessment - 04/02/18 1743    Subjective  Pt stated today has been rough, reports sensation sitting still and movements around her.  Reports double vision with her glasses on.  No reoprts of recent fall, has had some LOB episodes.    Pertinent History  DX with breast and renal cell carcinoma, fibromyalgia, chronic fatigue, sleep apnea.     Patient Stated Goals  help with the headache and the balance issues     Currently in Pain?  Yes    Pain Score  6     Pain Location  Head    Pain Orientation  Right;Posterior    Pain Descriptors / Indicators  Aching    Pain Onset  More than a month ago    Pain Frequency  Intermittent    Aggravating Factors   bright lights    Pain Relieving Factors  light dimmer, advil                       [facility] Adult PT Treatment/Exercise - 04/02/18 0001      Manual Therapy   Manual Therapy  Soft tissue mobilization    Manual therapy comments  Manual complete separate than rest of tx    Soft tissue mobilization  cervical musculature to address tension and reduce headache included suboccipital release and facial work      Vestibular Treatment/Exercise - 04/02/18 0001      Vestibular Treatment/Exercise   Vestibular Treatment Provided  Habituation;Gaze    Gaze Exercises  X2 Viewing Horizontal;Eye/Head Exercise Horizontal      Standing Horizontal Head Turns   Number of Reps   10    Symptom Description   NBOS on foam      X1 Viewing Horizontal   Foot Position  tandem stance 1st static then balance beam, cueing to reduce posterior lean    Reps  10      X2 Viewing Horizontal   Foot Position  NBOS on foam    Reps  10 3 sets: 1) statis 2) NBOS; 3) on foam     Comments  reports increased ease to turn right gaze with each set; increased diffuclty with Lt turns              PT Short Term Goals - 03/19/18 1639      PT SHORT TERM GOAL #1   Title  Pt to be able to  single leg stanc for 15 seconds bilaterally to reduce risk of falls     Time  3  Status  New    Target Date  04/02/18      PT SHORT TERM GOAL #2   Title  PT to have a negative Dix  Halpike manuever to rule our BPPV for the cause of dizziness    Time  3    Period  Weeks    Status  New      PT SHORT TERM GOAL #3   Title  Pt to state that her lose of balance while walking has decreased by 50%     Time  3    Period  Weeks    Status  New      PT SHORT TERM GOAL #4   Title  PT headaches to have decreased to 3 a week     Time  3    Period  Weeks    Status  New        PT Long Term Goals - 03/19/18 1641      PT LONG TERM GOAL #1   Title  PT to be able to single leg stance for 30 seconds on both LE to improve confidence to be able to shower without using her shower chair.     Time  6    Period  Weeks    Status  New    Target Date  04/30/18      PT LONG TERM GOAL #2   Title  Pt to have had no headaches in the past week     Time  6    Period  Weeks    Status  New      PT LONG TERM GOAL #3   Title  Pt to be confident  walking by herself     Time  6    Period  Weeks    Status  New      PT LONG TERM GOAL #4   Title  Pt to state that her vision has improved by 50%    Time  6    Period  Weeks    Status  New            Plan - 04/02/18 1825    Clinical Impression Statement  Pt arrived with reports of increased heaviness on Rt side of head, increased headache and c/o "waving" at entrance.  Dix Hallpik complete and no s/s or nystagmus through session and equal heaviness Bil so no Epley's maneuver complete this session.  Did progress to 2x gaze/head turn exercise.  Pt stated initial dizziness at 3/5 though did reduce to 2/5 following 3rd set with increased ease of head turns to the Right vs left.  EOS with manual soft tissue mobilization to address restrictions in cervical musculature, added suboccipitial release and fascial to address headaches with reports of relief  following and no dizziness at EOS.      Rehab Potential  Good    PT Frequency  2x / week    PT Duration  6 weeks    PT Treatment/Interventions  Canalith Repostioning;Gait training;Neuromuscular re-education;Balance training;Therapeutic exercise;Manual techniques    PT Next Visit Plan  Complete Rt and LT Dix halpike with cannalith repostitioning as needed; manual to cervical mm; Next session progress to 2 gaze exercises as HEP.  Progress to turn movements, STs with laser and gait training with eye movements and head turns as able.      PT Home Exercise Plan  current eye exercises; head movements; standing eye and head movements  Patient will benefit from skilled therapeutic intervention in order to improve the following deficits and impairments:  Abnormal gait, Decreased activity tolerance, Decreased balance, Dizziness, Difficulty walking  Visit Diagnosis: Difficulty in walking, not elsewhere classified  BPPV (benign paroxysmal positional vertigo), bilateral  History of falling     Problem List Patient Active Problem List   Diagnosis Date Noted  . Dizziness 10/29/2017  . Gait abnormality 10/29/2017  . Tremor, essential 10/29/2017  . Diplopia 10/29/2017  . Obstructive sleep apnea on CPAP 12/10/2016  . Plantar fasciitis, bilateral 12/10/2016  . Fibromyalgia 11/09/2016  . IBS (irritable bowel syndrome) 11/09/2016  . GERD (gastroesophageal reflux disease) 11/09/2016  . Anxiety and depression 11/09/2016  . MGUS (monoclonal gammopathy of unknown significance) 03/15/2016  . Breast cancer of lower-inner quadrant of right female breast ([place]) 08/26/2015  . Headache 07/17/2015  . Hypertension 07/17/2015  . Obesity (BMI 35.0-39.9 without comorbidity) 07/17/2015   Ihor Austin, [place]; [place]  Aldona Lento 04/02/2018, 6:31 PM  [place], [place], [address] Phone: [phone]   Fax:   [phone]  Name: Sarah Cooley MRN: [id number] Date of Birth: December 04, 1965

## 2018-04-04 ENCOUNTER — Ambulatory Visit (HOSPITAL_COMMUNITY): Payer: BLUE CROSS/BLUE SHIELD

## 2018-04-04 ENCOUNTER — Encounter (HOSPITAL_COMMUNITY): Payer: Self-pay

## 2018-04-04 DIAGNOSIS — R262 Difficulty in walking, not elsewhere classified: Secondary | ICD-10-CM | POA: Diagnosis not present

## 2018-04-04 DIAGNOSIS — Z9181 History of falling: Secondary | ICD-10-CM

## 2018-04-04 DIAGNOSIS — H8113 Benign paroxysmal vertigo, bilateral: Secondary | ICD-10-CM | POA: Diagnosis not present

## 2018-04-04 NOTE — Therapy (Signed)
[name], [place], [address] Phone: [phone]   Fax:  [phone]  Physical Therapy Treatment  Patient Details  Name: Sarah Cooley MRN: [id number] Date of Birth: [birth year] Referring Provider: Pablo Lawrence   Encounter Date: 04/04/2018  PT End of Session - 04/04/18 1746    Visit Number  6    Number of Visits  12    Date for PT Re-Evaluation  04/30/18    Authorization Type  BCBS    Authorization Time Period  5/29-->04/30/18    Authorization - Visit Number  6    Authorization - Number of Visits  30 30 visit limit    PT Start Time  1728    PT Stop Time  7253    PT Time Calculation (min)  50 min    Activity Tolerance  Patient tolerated treatment well;No increased pain headache resolved, 2 vertigo episodes during session that lasted no longer than 5"    Behavior During Therapy  [facility] for tasks assessed/performed       Past Medical History:  Diagnosis Date  . Allergy    seasonal  . Anxiety   . Arthritis   . Balance problem   . Brain aneurysm    2 mm ACA region aneurysm by 09/21/15 MRA  . Breast cancer ([place])   . Breast cancer of lower-inner quadrant of right female breast ([place]) 08/26/2015  . Chronic kidney disease    acute renal failure one occasion  . Complication of anesthesia    heart rate drops and O2 sats drop  . Constipation   . Depression   . Diabetes mellitus without complication ([place])    not on medication at this time  . Diplopia 10/29/2017  . Dizziness 10/29/2017  . Elevated liver function tests   . Family history of adverse reaction to anesthesia    mom has n/v  . Family history of breast cancer   . Family history of colon cancer   . Fibromyalgia   . Fibromyalgia   . Gait abnormality 10/29/2017  . Genetic testing 09/08/2015   Negative genetic testing on the Breast/High Moderate Risk panel. The Breast High/Moderate Risk gene panel offered by [facility] includes sequencing and  deletion/duplication analysis of the following 9 genes: ATM, BRCA1, BRCA2, CDH1, CHEK2, PALB2, PTEN, STK11, and TP53. The report date is September 08, 2015.  The Rest of the Comprehensive Cancer panel has been reflexed and will be available in 2-3 weeks.  POLD1 c.208G>T VUS found on the Remainder of the Comprehensive cancer panel.  The Comprehensive Cancer Panel offered by [facility] includes sequencing and/or deletion duplication testing of the following 32 genes: APC, ATM, AXIN2, BARD1, BMPR1A, BRCA1, BRCA2, BRIP1, CDH1, CDK4, CDKN2A, CHEK2, EPCAM, FANCC, MLH1, MSH2, MSH6, MUTYH, NBN, PALB2, PMS2, POLD1, POLE, PTEN, RAD51C, RAD51D, SCG5/GREM1, SMAD4, STK11, TP53, VHL, and XRCC2.   The report date is 09/12/2015.   Marland Kitchen GERD (gastroesophageal reflux disease)    none recent  . H/O acute renal failure 09/22/15   admitted to [facility]  . Headache    occipital and temporal  . History of hiatal hernia   . Hyperlipemia   . Hyperlipidemia   . Hypertension   . Irritable bowel syndrome (IBS)   . MGUS (monoclonal gammopathy of unknown significance) 03/15/2016  . Neuromuscular disorder (HCC)    fibromyalgia  . Occasional tremors   . OSA (obstructive sleep apnea)    uses cpap with humidification  . Personal history of  radiation therapy   . PONV (postoperative nausea and vomiting)   . Tremor, essential 10/29/2017    Past Surgical History:  Procedure Laterality Date  . ABDOMINAL HYSTERECTOMY     adenomyosis  . ABDOMINAL HYSTERECTOMY    . BLADDER SURGERY    . BONE GRAFT HIP ILIAC CREST    . BREAST BIOPSY Bilateral   . BREAST LUMPECTOMY    . BREAST SURGERY    . CHOLECYSTECTOMY    . FRACTURE SURGERY Right    wrist  . KNEE ARTHROSCOPY    . KNEE ARTHROSCOPY W/ ACL RECONSTRUCTION     right  . RADIOACTIVE SEED GUIDED PARTIAL MASTECTOMY WITH AXILLARY SENTINEL LYMPH NODE BIOPSY Right 10/13/2015   Procedure: RADIOACTIVE SEED GUIDED PARTIAL MASTECTOMY WITH AXILLARY SENTINEL LYMPH NODE BIOPSY;  Surgeon:  Erroll Luna, MD;  Location: [place];  Service: General;  Laterality: Right;  . WRIST SURGERY      There were no vitals filed for this visit.  Subjective Assessment - 04/04/18 1729    Subjective  Pt arrived stated she feels anxious, angry and upset with all she has going on.  Reports difficulty contacting PCP.  Reoprts she had 1 dizzy spell when she laid down last night.  Stated she felt better following last session with no dizziness or headaches but felt sick when she left [place].      Pertinent History  DX with breast and renal cell carcinoma, fibromyalgia, chronic fatigue, sleep apnea.     Patient Stated Goals  help with the headache and the balance issues     Currently in Pain?  Yes    Pain Score  0-No pain was 4/10, no back pain currently    Pain Location  Back    Pain Orientation  Right;Lower    Pain Relieving Factors  blueice, IBProfen    Pain Score  5    Pain Location  Hand    Pain Orientation  Right;Posterior    Pain Descriptors / Indicators  Aching    Pain Type  Chronic pain    Pain Onset  More than a month ago    Pain Frequency  Intermittent    Aggravating Factors   bright lights    Pain Relieving Factors  dimmer lights, aleve                       [facility] Adult PT Treatment/Exercise - 04/04/18 0001      Manual Therapy   Manual Therapy  Soft tissue mobilization    Manual therapy comments  Manual complete separate than rest of tx    Soft tissue mobilization  cervical musculature to address tension and reduce headache included suboccipital release and facial work      Vestibular Treatment/Exercise - 04/04/18 0001      Vestibular Treatment/Exercise   Vestibular Treatment Provided  Habituation;Gaze    Gaze Exercises  X2 Viewing Horizontal;X2 Viewing Vertical;Eye/Head Exercise Horizontal;Eye/Head Exercise Vertical      Standing Horizontal Head Turns   Number of Reps   10    Symptom Description   tandem stance on foam      Standing Vertical  Head Turns   Number of Reps   10    Symptom Description   tandem stance on foam      X1 Viewing Horizontal   Foot Position  tandem stance on foam    Reps  10 2 sets each direciton    Comments  cueing to reduce  posterior lean      X2 Viewing Horizontal   Foot Position  tandem stance on foam    Reps  10 2 sets each direction     Comments  cueing to reduce posterior lean; reports increased ease to turn right gaze vs lt      Eye/Head Exercise Horizontal   Foot Position  tandem on foam    Reps  10 2 sets each direction    Comments  cueing to reduce posterior lean; reports increased ease to turn right gaze vs lt      Eye/Head Exercise Vertical   Foot Position  tandem stance on foam    Reps  10    Comments  no s/s         Balance Exercises - 04/04/18 1746      Balance Exercises: Standing   Tandem Stance  Eyes open;Foam/compliant surface;4 reps initially 2 gaze horizontal then vertical then head turns    Wall Bumps  Shoulder;Hip    Wall Bumps-Shoulders  10 reps;Anterior/posterior    Wall Bumps-Hips  10 reps;Anterior/posterior    Sit to Stand Time  10 STS wiht laser focus on one object    Other Standing Exercises  Gait training 277f with head turns          PT Short Term Goals - 03/19/18 1639      PT SHORT TERM GOAL #1   Title  Pt to be able to single leg stanc for 15 seconds bilaterally to reduce risk of falls     Time  3    Status  New    Target Date  04/02/18      PT SHORT TERM GOAL #2   Title  PT to have a negative Dix  Halpike manuever to rule our BPPV for the cause of dizziness    Time  3    Period  Weeks    Status  New      PT SHORT TERM GOAL #3   Title  Pt to state that her lose of balance while walking has decreased by 50%     Time  3    Period  Weeks    Status  New      PT SHORT TERM GOAL #4   Title  PT headaches to have decreased to 3 a week     Time  3    Period  Weeks    Status  New        PT Long Term Goals - 03/19/18 1641      PT LONG  TERM GOAL #1   Title  PT to be able to single leg stance for 30 seconds on both LE to improve confidence to be able to shower without using her shower chair.     Time  6    Period  Weeks    Status  New    Target Date  04/30/18      PT LONG TERM GOAL #2   Title  Pt to have had no headaches in the past week     Time  6    Period  Weeks    Status  New      PT LONG TERM GOAL #3   Title  Pt to be confident  walking by herself     Time  6    Period  Weeks    Status  New      PT LONG TERM GOAL #4   Title  Pt to state that her vision has improved by 50%    Time  6    Period  Weeks    Status  New            Plan - 04/04/18 1831    Clinical Impression Statement  Pt reports decreased frequency of dizzy episodes to 2-3 episodes per day and decreased duration though same intensity.  No reports of dizziness at entrance today.  Session foucs on 2 objects gaze/head turns, progressed to tandem stance for balance training with some LOB though able to correct independently.  Pt with tendency to lean posterior requiring cueing to improve awareness with weight bearing.  Added wall bumps for core strengthening to assist with reduced posterior lean.  Added laser STS for eye strengthening wiht movements and head turns with gait.  Added 2 gaze/head turn exercise to HEP.  Pt with 2 episodes of dizziness following transition or supine to sit though only lasted a few seconds.  Continued wiht manual to cervical muscualture to address soft tissue restrictions, pt reports headache resolved following.      Rehab Potential  Good    PT Frequency  2x / week    PT Duration  6 weeks    PT Treatment/Interventions  Canalith Repostioning;Gait training;Neuromuscular re-education;Balance training;Therapeutic exercise;Manual techniques    PT Next Visit Plan  Complete Lt Diz halpike with cannalith repositioning as needed.  Continue manual to cervical mm.  Next session progress to turn movements and continue balance  activities with focus on gaze.    PT Home Exercise Plan  current eye exercises; head movements; standing eye and head movements; 2 gaze/head turn       Patient will benefit from skilled therapeutic intervention in order to improve the following deficits and impairments:  Abnormal gait, Decreased activity tolerance, Decreased balance, Dizziness, Difficulty walking  Visit Diagnosis: Difficulty in walking, not elsewhere classified  BPPV (benign paroxysmal positional vertigo), bilateral  History of falling     Problem List Patient Active Problem List   Diagnosis Date Noted  . Dizziness 10/29/2017  . Gait abnormality 10/29/2017  . Tremor, essential 10/29/2017  . Diplopia 10/29/2017  . Obstructive sleep apnea on CPAP 12/10/2016  . Plantar fasciitis, bilateral 12/10/2016  . Fibromyalgia 11/09/2016  . IBS (irritable bowel syndrome) 11/09/2016  . GERD (gastroesophageal reflux disease) 11/09/2016  . Anxiety and depression 11/09/2016  . MGUS (monoclonal gammopathy of unknown significance) 03/15/2016  . Breast cancer of lower-inner quadrant of right female breast ([place]) 08/26/2015  . Headache 07/17/2015  . Hypertension 07/17/2015  . Obesity (BMI 35.0-39.9 without comorbidity) 07/17/2015   Ihor Austin, [place]; [place]  Aldona Lento 04/04/2018, 6:39 PM  [place], [place], [address] Phone: [phone]   Fax:  [phone]  Name: JERZEE JEROME MRN: [id number] Date of Birth: March 26, 1966

## 2018-04-08 ENCOUNTER — Ambulatory Visit (HOSPITAL_COMMUNITY): Payer: BLUE CROSS/BLUE SHIELD

## 2018-04-08 DIAGNOSIS — R262 Difficulty in walking, not elsewhere classified: Secondary | ICD-10-CM

## 2018-04-08 DIAGNOSIS — Z9181 History of falling: Secondary | ICD-10-CM

## 2018-04-08 DIAGNOSIS — H8113 Benign paroxysmal vertigo, bilateral: Secondary | ICD-10-CM

## 2018-04-08 NOTE — Therapy (Signed)
[place], [place], [address] Phone: [phone]   Fax:  [phone]    Progress Note Reporting Period 03/19/18 to 04/08/18  See note below for Objective Data and Assessment of Progress/Goals.    Physical Therapy Treatment  Patient Details  Name: Sarah Cooley MRN: [id number] Date of Birth: [birth year] Referring Provider: Pablo Lawrence   Encounter Date: 04/08/2018  PT End of Session - 04/08/18 0814    Visit Number  7    Number of Visits  12    Date for PT Re-Evaluation  04/30/18 mini reassess complete 04/08/18    Authorization Type  [employer]    Authorization Time Period  5/29-->04/30/18    Authorization - Visit Number  7    Authorization - Number of Visits  30 30 visit limit    PT Start Time  0815    PT Stop Time  2778    PT Time Calculation (min)  40 min    Activity Tolerance  Patient tolerated treatment well;No increased pain headache resolved, 2 vertigo episodes during session that lasted no longer than 5"    Behavior During Therapy  [facility] for tasks assessed/performed       Past Medical History:  Diagnosis Date  . Allergy    seasonal  . Anxiety   . Arthritis   . Balance problem   . Brain aneurysm    2 mm ACA region aneurysm by 09/21/15 MRA  . Breast cancer ([place])   . Breast cancer of lower-inner quadrant of right female breast ([place]) 08/26/2015  . Chronic kidney disease    acute renal failure one occasion  . Complication of anesthesia    heart rate drops and O2 sats drop  . Constipation   . Depression   . Diabetes mellitus without complication ([place])    not on medication at this time  . Diplopia 10/29/2017  . Dizziness 10/29/2017  . Elevated liver function tests   . Family history of adverse reaction to anesthesia    mom has n/v  . Family history of breast cancer   . Family history of colon cancer   . Fibromyalgia   . Fibromyalgia   . Gait abnormality 10/29/2017  . Genetic testing 09/08/2015   Negative genetic testing on the Breast/High Moderate Risk panel. The Breast High/Moderate Risk gene panel offered by [facility] includes sequencing and deletion/duplication analysis of the following 9 genes: ATM, BRCA1, BRCA2, CDH1, CHEK2, PALB2, PTEN, STK11, and TP53. The report date is September 08, 2015.  The Rest of the Comprehensive Cancer panel has been reflexed and will be available in 2-3 weeks.  POLD1 c.208G>T VUS found on the Remainder of the Comprehensive cancer panel.  The Comprehensive Cancer Panel offered by [facility] includes sequencing and/or deletion duplication testing of the following 32 genes: APC, ATM, AXIN2, BARD1, BMPR1A, BRCA1, BRCA2, BRIP1, CDH1, CDK4, CDKN2A, CHEK2, EPCAM, FANCC, MLH1, MSH2, MSH6, MUTYH, NBN, PALB2, PMS2, POLD1, POLE, PTEN, RAD51C, RAD51D, SCG5/GREM1, SMAD4, STK11, TP53, VHL, and XRCC2.   The report date is 09/12/2015.   Marland Kitchen GERD (gastroesophageal reflux disease)    none recent  . H/O acute renal failure 09/22/15   admitted to [facility]  . Headache    occipital and temporal  . History of hiatal hernia   . Hyperlipemia   . Hyperlipidemia   . Hypertension   . Irritable bowel syndrome (IBS)   . MGUS (monoclonal gammopathy of unknown significance) 03/15/2016  . Neuromuscular disorder ([place])  fibromyalgia  . Occasional tremors   . OSA (obstructive sleep apnea)    uses cpap with humidification  . Personal history of radiation therapy   . PONV (postoperative nausea and vomiting)   . Tremor, essential 10/29/2017    Past Surgical History:  Procedure Laterality Date  . ABDOMINAL HYSTERECTOMY     adenomyosis  . ABDOMINAL HYSTERECTOMY    . BLADDER SURGERY    . BONE GRAFT HIP ILIAC CREST    . BREAST BIOPSY Bilateral   . BREAST LUMPECTOMY    . BREAST SURGERY    . CHOLECYSTECTOMY    . FRACTURE SURGERY Right    wrist  . KNEE ARTHROSCOPY    . KNEE ARTHROSCOPY W/ ACL RECONSTRUCTION     right  . RADIOACTIVE SEED GUIDED PARTIAL MASTECTOMY WITH AXILLARY  SENTINEL LYMPH NODE BIOPSY Right 10/13/2015   Procedure: RADIOACTIVE SEED GUIDED PARTIAL MASTECTOMY WITH AXILLARY SENTINEL LYMPH NODE BIOPSY;  Surgeon: Erroll Luna, MD;  Location: [place];  Service: General;  Laterality: Right;  . WRIST SURGERY      There were no vitals filed for this visit.  Subjective Assessment - 04/08/18 0817    Subjective  Pt states that she is feeling off this morning. She took a shower this morning and the movements in there have made her head feel funny. She still reports episodes but states they are not as frequent but they are just as intense. Most everything to the L is always worse.     Pertinent History  DX with breast and renal cell carcinoma, fibromyalgia, chronic fatigue, sleep apnea.     Patient Stated Goals  help with the headache and the balance issues     Currently in Pain?  Yes    Pain Score  4     Pain Location  Head    Pain Orientation  Posterior;Anterior;Right    Pain Descriptors / Indicators  Aching    Pain Type  Chronic pain    Pain Onset  More than a month ago    Pain Frequency  Constant    Aggravating Factors   bright lights    Pain Relieving Factors  blue ice, ibuprofen    Pain Onset  More than a month ago         [facility] PT Assessment - 04/08/18 0001      Assessment   Medical Diagnosis  vertigo     Referring Provider  Pablo Lawrence    Onset Date/Surgical Date  09/05/17    Hand Dominance  Right    Next MD Visit  not scheduled with Pablo Lawrence, sees Dr. Jannifer Franklin the neurologist 04/09/18    Prior [place]       Precautions   Precautions  Fall      Observation/Other Assessments   Focus on Therapeutic Outcomes (FOTO)   -- was 40      Balance   Balance Assessed  Yes      Static Standing Balance   Static Standing - Balance Support  No upper extremity supported    Static Standing Balance -  Activities   Single Leg Stance - Right Leg;Single Leg Stance - Left Leg    Static Standing - Comment/# of Minutes  R: 15 sec L:  33 sec              Vestibular Assessment - 04/08/18 0001      Dix-Hallpike Left   Dix-Hallpike Left Duration  <30 sec    Dix-Hallpike  Left Symptoms  No nystagmus;Other (comment) +for symptoms      Vestibular Treatment/Exercise - 04/08/18 0001       EPLEY MANUEVER LEFT   Number of Reps   2    Overall Response   Improved Symptoms     RESPONSE DETAILS LEFT  decreased symptoms on 2RT, minimal nystagmus noted during 2nd Dix-Hallpike to the L            PT Education - 04/08/18 0815    Education provided  Yes    Education Details  reassessment findings    Person(s) Educated  Patient    Methods  Explanation;Demonstration    Comprehension  Verbalized understanding;Returned demonstration       PT Short Term Goals - 04/08/18 0821      PT SHORT TERM GOAL #1   Title  Pt to be able to single leg stanc for 15 seconds bilaterally to reduce risk of falls     Baseline  6/18: R: 15 sec, L: 33 sec    Time  3    Status  Achieved      PT SHORT TERM GOAL #2   Title  PT to have a negative Dix  Halpike manuever to rule our BPPV for the cause of dizziness    Time  3    Period  Weeks    Status  On-going      PT SHORT TERM GOAL #3   Title  Pt to state that her lose of balance while walking has decreased by 50%     Baseline  6/18: good days and bad days    Time  3    Period  Weeks    Status  On-going      PT SHORT TERM GOAL #4   Title  PT headaches to have decreased to 3 a week     Baseline  6/18: still has headaches every day    Time  3    Period  Weeks    Status  On-going        PT Long Term Goals - 04/08/18 4650      PT LONG TERM GOAL #1   Title  PT to be able to single leg stance for 30 seconds on both LE to improve confidence to be able to shower without using her shower chair.     Baseline  6/18: R: 15 sec, L: 33 sec    Time  6    Period  Weeks    Status  Partially Met      PT LONG TERM GOAL #2   Title  Pt to have had no headaches in the past week      Baseline  6/18: still has headaches every day    Time  6    Period  Weeks    Status  On-going      PT LONG TERM GOAL #3   Title  Pt to be confident walking by herself     Baseline  6/18: not totally confident but still does it even with her imbalance issues    Time  6    Period  Weeks    Status  On-going      PT LONG TERM GOAL #4   Title  Pt to state that her vision has improved by 50%    Baseline  6/18: feels her vision is worse    Time  6    Period  Weeks    Status  On-going  Plan - 04/08/18 0939    Clinical Impression Statement  PT reassessed pt's goals and outcome measures this date. Pt's balance has significantly improved as she was able to perform SLS for 15 sec on R and 30sec on L. She still reports recreation of symptoms and dizziness with movements to the L, however, she states that her episodes are less frequent and are beginning to become less intense. She states that she is still off-balance with walking and she is still afraid of stair ambulation due to balance deficits. PT performed Dix-Hallpike to the L this date and during the first round, she did not have any nystagmus but did report recreation of symptoms; went directly into Epley's Maneuver for treatment. After ~58mn seated rest break, performed Dix-Hallpike to the L again and pt had decreased symptoms with mild nystagmus noted; again, treated with Epley Maneuver and throughout entire treatment, she still reported symptoms but stated they were less intense than the first round through. Overall, pt is making progress towards goals and needs continued skilled PT intervention to address remaining deficits in order to maximize her balance and decrease risk for falls.    Rehab Potential  Good    PT Frequency  2x / week    PT Duration  6 weeks    PT Treatment/Interventions  Canalith Repostioning;Gait training;Neuromuscular re-education;Balance training;Therapeutic exercise;Manual techniques    PT Next Visit Plan   Complete Lt Diz halpike with cannalith repositioning as needed.  Continue manual to cervical mm.  Next session progress to turn movements and continue balance activities with focus on gaze.    PT Home Exercise Plan  current eye exercises; head movements; standing eye and head movements; 2 gaze/head turn    Consulted and Agree with Plan of Care  Patient       Patient will benefit from skilled therapeutic intervention in order to improve the following deficits and impairments:  Abnormal gait, Decreased activity tolerance, Decreased balance, Dizziness, Difficulty walking  Visit Diagnosis: Difficulty in walking, not elsewhere classified  BPPV (benign paroxysmal positional vertigo), bilateral  History of falling     Problem List Patient Active Problem List   Diagnosis Date Noted  . Dizziness 10/29/2017  . Gait abnormality 10/29/2017  . Tremor, essential 10/29/2017  . Diplopia 10/29/2017  . Obstructive sleep apnea on CPAP 12/10/2016  . Plantar fasciitis, bilateral 12/10/2016  . Fibromyalgia 11/09/2016  . IBS (irritable bowel syndrome) 11/09/2016  . GERD (gastroesophageal reflux disease) 11/09/2016  . Anxiety and depression 11/09/2016  . MGUS (monoclonal gammopathy of unknown significance) 03/15/2016  . Breast cancer of lower-inner quadrant of right female breast ([employer] 08/26/2015  . Headache 07/17/2015  . Hypertension 07/17/2015  . Obesity (BMI 35.0-39.9 without comorbidity) 07/17/2015       BGeraldine SolarPT, DPT  [address] [id number]: [phone]  Fax:  [phone] Name: Sarah TRUSZKOWSKIMRN: [id number] of Birth: [birth year]

## 2018-04-09 ENCOUNTER — Encounter: Payer: Self-pay | Admitting: Neurology

## 2018-04-09 ENCOUNTER — Ambulatory Visit: Payer: BLUE CROSS/BLUE SHIELD | Admitting: Neurology

## 2018-04-09 VITALS — BP 116/80 | HR 61 | Ht 68.25 in | Wt 234.5 lb

## 2018-04-09 DIAGNOSIS — R51 Headache: Secondary | ICD-10-CM | POA: Diagnosis not present

## 2018-04-09 DIAGNOSIS — G250 Essential tremor: Secondary | ICD-10-CM

## 2018-04-09 DIAGNOSIS — S060X1D Concussion with loss of consciousness of 30 minutes or less, subsequent encounter: Secondary | ICD-10-CM

## 2018-04-09 DIAGNOSIS — R55 Syncope and collapse: Secondary | ICD-10-CM | POA: Diagnosis not present

## 2018-04-09 DIAGNOSIS — R519 Headache, unspecified: Secondary | ICD-10-CM

## 2018-04-09 MED ORDER — TOPIRAMATE 25 MG PO TABS: ORAL_TABLET | ORAL | 3 refills | Status: DC

## 2018-04-09 NOTE — Progress Notes (Signed)
Reason for visit: Gait disorder, essential tremor  Sarah Cooley is an 52 y.o. female  History of present illness:  Sarah Cooley is a 52 year old right-handed white female with a history of an essential tremor affecting the head and neck primarily, she has an associated gait disorder, she has had a significant fall on 18 January 2018.  The patient fell down a flight of stairs, she does not remember the fall, she is not sure whether or not she blacked out and then fell or fell and then lost consciousness.  The patient had urinary incontinence with the event.  The patient sustained a concussion, she has had posttraumatic vertigo, she is seeing physical therapy for this.  She has had right-sided headaches since the fall, she has a pre-existing history of migraine.  The patient is not sleeping well, she has sleep apnea on CPAP.  She has had increased fatigue and increased anxiety.  The patient is on clonazepam taking 1 mg tablets twice daily, sometimes 3 times daily.  The patient continues to have double vision, she has a rocking sensation when she looks up.  She has not had any further falls since the end of March.  As part of work-up, they found evidence of a left renal cell carcinoma that is being evaluated currently.  She has had ongoing nausea associated with the vertigo.  The headaches are daily in nature.  She has neck pain and neck stiffness.  CT scan of the brain and cervical spine done following the fall were normal.  Recent MRI of the brain also was normal.  The patient returns to this office for an evaluation.  Past Medical History:  Diagnosis Date  . Allergy    seasonal  . Anxiety   . Arthritis   . Balance problem   . Brain aneurysm    2 mm ACA region aneurysm by 09/21/15 MRA  . Breast cancer ([place])   . Breast cancer of lower-inner quadrant of right female breast ([place]) 08/26/2015  . Chronic kidney disease    acute renal failure one occasion  . Complication of anesthesia    heart  rate drops and O2 sats drop  . Constipation   . Depression   . Diabetes mellitus without complication ([place])    not on medication at this time  . Diplopia 10/29/2017  . Dizziness 10/29/2017  . Elevated liver function tests   . Family history of adverse reaction to anesthesia    mom has n/v  . Family history of breast cancer   . Family history of colon cancer   . Fibromyalgia   . Fibromyalgia   . Gait abnormality 10/29/2017  . Genetic testing 09/08/2015   Negative genetic testing on the Breast/High Moderate Risk panel. The Breast High/Moderate Risk gene panel offered by [facility] includes sequencing and deletion/duplication analysis of the following 9 genes: ATM, BRCA1, BRCA2, CDH1, CHEK2, PALB2, PTEN, STK11, and TP53. The report date is September 08, 2015.  The Rest of the Comprehensive Cancer panel has been reflexed and will be available in 2-3 weeks.  POLD1 c.208G>T VUS found on the Remainder of the Comprehensive cancer panel.  The Comprehensive Cancer Panel offered by [facility] includes sequencing and/or deletion duplication testing of the following 32 genes: APC, ATM, AXIN2, BARD1, BMPR1A, BRCA1, BRCA2, BRIP1, CDH1, CDK4, CDKN2A, CHEK2, EPCAM, FANCC, MLH1, MSH2, MSH6, MUTYH, NBN, PALB2, PMS2, POLD1, POLE, PTEN, RAD51C, RAD51D, SCG5/GREM1, SMAD4, STK11, TP53, VHL, and XRCC2.   The report date is 09/12/2015.   Marland Kitchen  GERD (gastroesophageal reflux disease)    none recent  . H/O acute renal failure 09/22/15   admitted to [facility]  . Headache    occipital and temporal  . History of hiatal hernia   . Hyperlipemia   . Hyperlipidemia   . Hypertension   . Irritable bowel syndrome (IBS)   . MGUS (monoclonal gammopathy of unknown significance) 03/15/2016  . Neuromuscular disorder ([facility])    fibromyalgia  . Occasional tremors   . OSA (obstructive sleep apnea)    uses cpap with humidification  . Personal history of radiation therapy   . PONV (postoperative nausea and vomiting)   . Tremor, essential  10/29/2017    Past Surgical History:  Procedure Laterality Date  . ABDOMINAL HYSTERECTOMY     adenomyosis  . ABDOMINAL HYSTERECTOMY    . BLADDER SURGERY    . BONE GRAFT HIP ILIAC CREST    . BREAST BIOPSY Bilateral   . BREAST LUMPECTOMY    . BREAST SURGERY    . CHOLECYSTECTOMY    . FRACTURE SURGERY Right    wrist  . KNEE ARTHROSCOPY    . KNEE ARTHROSCOPY W/ ACL RECONSTRUCTION     right  . RADIOACTIVE SEED GUIDED PARTIAL MASTECTOMY WITH AXILLARY SENTINEL LYMPH NODE BIOPSY Right 10/13/2015   Procedure: RADIOACTIVE SEED GUIDED PARTIAL MASTECTOMY WITH AXILLARY SENTINEL LYMPH NODE BIOPSY;  Surgeon: Erroll Luna, MD;  Location: [place];  Service: General;  Laterality: Right;  . WRIST SURGERY      Family History  Problem Relation Age of Onset  . Hypertension Mother   . Autoimmune disease Mother        lichen planus  . Eczema Mother   . Parkinson's disease Mother   . Mental illness Mother        bipolar  . Heart disease Maternal Aunt   . Lung cancer Paternal Aunt   . Breast cancer Paternal Aunt        dx in her 70s  . Lung cancer Paternal Grandfather   . Colon cancer Paternal Uncle        dx in her 72s  . Colon cancer Paternal Uncle        dx in his 72s  . Melanoma Father 105  . Psoriasis Father   . Hypertension Father   . Heart disease Father   . Breast cancer Sister        dx in her 58s  . Lupus Sister   . COPD Maternal Uncle   . Colon cancer Paternal Uncle        dx in his 22s  . Mental illness Son        bipolar    Social history:  reports that she has never smoked. She has never used smokeless tobacco. She reports that she does not drink alcohol or use drugs.    Allergies  Allergen Reactions  . Ramipril Nausea And Vomiting and Other (See Comments)    Ramipril--Caused Pancreatitis  . Shrimp [Shellfish Allergy] Anaphylaxis and Swelling  . Minocycline Hcl Hives  . Altace [Ramipril] Nausea And Vomiting and Nausea Only  . Lyrica [Pregabalin] Nausea And Vomiting    . Adhesive [Tape] Rash    Please use paper tape  . Bactrim [Sulfamethoxazole-Trimethoprim] Other (See Comments)    May have caused kidney issues  . Drixoral [Brompheniramine-Pseudoeph] Rash  . Erythromycin Other (See Comments)    GI- Upset / severe abdominal pain  . Lyrica [Pregabalin] Anxiety    Dizziness and worsening  depression  . Minocin [Minocycline Hcl] Nausea And Vomiting and Rash  . Septra [Sulfamethoxazole-Trimethoprim] Rash  . Shrimp [Shellfish Allergy] Rash  . Sinutab [Chlorphen-Pseudoephed-Apap] Rash    Medications:  Prior to Admission medications   Medication Sig Start Date End Date Taking? Authorizing Provider  carvedilol (COREG) 12.5 MG tablet Take 12.5 mg by mouth 2 (two) times daily.   Yes [provider]  chlorthalidone (HYGROTON) 25 MG tablet Take 25 mg by mouth daily.   Yes [provider]  clonazePAM (KLONOPIN) 1 MG tablet Take 1 mg by mouth daily.   Yes [provider]  diltiazem (CARDIZEM) 120 MG tablet Take 120 mg by mouth daily.   Yes [provider]  FLUoxetine (PROZAC) 20 MG capsule Take 20 mg by mouth daily.   Yes [provider]  FLUoxetine (PROZAC) 40 MG capsule Take 40 mg by mouth daily.   Yes [provider]  pantoprazole (PROTONIX) 40 MG tablet Take 40 mg by mouth daily before breakfast.   Yes [provider]  potassium chloride SA (K-DUR,KLOR-CON) 20 MEQ tablet Take 20 mEq by mouth daily.   Yes [provider]  pravastatin (PRAVACHOL) 10 MG tablet Take 10 mg by mouth daily.   Yes [provider]  Vitamin D, Ergocalciferol, (DRISDOL) 50000 units CAPS capsule Take 50,000 Units by mouth every 7 (seven) days. Take 1 tablet (50,000 units) by mouth every Thursday   Yes [provider]  topiramate (TOPAMAX) 25 MG tablet One tablet at night for one week, then take one tablet twice a day for 1 week, then take one in the morning and 2 in the evening 04/09/18   Kathrynn Ducking, MD    ROS:  Out of a complete 14 system review of symptoms, the patient complains only of the following symptoms, and all other reviewed systems are negative.  Decreased appetite, fatigue Hearing loss, ringing in the ears Eye itching, light sensitivity, double vision, blurred vision Chest tightness Chest pain, palpitations of the heart Abdominal pain, diarrhea, nausea Insomnia, sleep apnea, daytime sleepiness Joint pain, back pain, aching muscles, muscle cramps, walking difficulty, neck pain, neck stiffness Itching Swollen lymph nodes Dizziness, headache, numbness, tremors Agitation, depression, anxiety  Blood pressure 116/80, pulse 61, height 5' 8.25" (1.734 m), weight 234 lb 8 oz (106.4 kg).  Physical Exam  General: The patient is alert and cooperative at the time of the examination.  The patient is markedly obese.  Skin: No significant peripheral edema is noted.   Neurologic Exam  Mental status: The patient is alert and oriented x 3 at the time of the examination. The patient has apparent normal recent and remote memory, with an apparently normal attention span and concentration ability.   Cranial nerves: Facial symmetry is present. Speech is normal, no aphasia or dysarthria is noted. Extraocular movements are full. Visual fields are full.  End gaze nystagmus is seen bilaterally.  A head and neck tremor is seen.  Motor: The patient has good strength in all 4 extremities.  Sensory examination: Soft touch sensation is symmetric on the face, arms, and legs.  Coordination: The patient has good finger-nose-finger and heel-to-shin bilaterally.  Gait and station: The patient has a normal gait. Tandem gait is normal. Romberg is negative, but is unsteady. No drift is seen.  When looking up, the patient has a tendency to fall backwards.  Reflexes: Deep tendon reflexes are symmetric.   Assessment/Plan:  1.  Essential tremor  2.  Anxiety and depression  3.  Recent  concussion associated with loss of consciousness  4.  History of migraine headache  5.  Gait disorder  6.  Sleep apnea  The patient has had a recent significant fall that resulted in a concussion.  The patient has had increased headache, she has had vertigo since that time associated with nausea.  Her anxiety issue has worsened.  Fortunately, CT scan of the brain and cervical spine were unremarkable.  The patient is on clonazepam for anxiety and for the tremor, this may result in increased gait instability, and should be tapered at some point in the future.  The patient is having heightened anxiety now, we will delay the taper off the medication for now.  The patient will be placed on Topamax for the headache and for the tremor.  She has been on Topamax previously for psychiatric reasons.  She will follow-up through this office in about 4 months.  An EEG study will be done as the patient indicates that she may have blacked out before the fall.  Jill Alexanders MD 04/09/2018 9:38 AM  [facility] [address] [place], [place] [address]  Phone [phone] Fax [phone]

## 2018-04-11 ENCOUNTER — Ambulatory Visit (HOSPITAL_COMMUNITY): Payer: BLUE CROSS/BLUE SHIELD

## 2018-04-11 ENCOUNTER — Encounter (HOSPITAL_COMMUNITY): Payer: Self-pay

## 2018-04-11 DIAGNOSIS — Z9181 History of falling: Secondary | ICD-10-CM | POA: Diagnosis not present

## 2018-04-11 DIAGNOSIS — H8113 Benign paroxysmal vertigo, bilateral: Secondary | ICD-10-CM | POA: Diagnosis not present

## 2018-04-11 DIAGNOSIS — R262 Difficulty in walking, not elsewhere classified: Secondary | ICD-10-CM

## 2018-04-11 NOTE — Therapy (Signed)
[place], [place], [address] Phone: [phone]   Fax:  [phone]  Physical Therapy Treatment  Patient Details  Name: Sarah Cooley MRN: [id number] Date of Birth: [birth year] Referring Provider: Pablo Lawrence   Encounter Date: 04/11/2018  PT End of Session - 04/11/18 1742    Visit Number  8    Number of Visits  12    Date for PT Re-Evaluation  04/30/18 Mini-reassessment complete 04/08/18    Authorization Type  BCBS    Authorization Time Period  5/29-->04/30/18    Authorization - Visit Number  8    Authorization - Number of Visits  30 30 visit limit    PT Start Time  5056    PT Stop Time  9794    PT Time Calculation (min)  47 min    Activity Tolerance  Patient tolerated treatment well;No increased pain reports headache reduced from 6/10 to 2/10 following manual    Behavior During Therapy  [facility] for tasks assessed/performed       Past Medical History:  Diagnosis Date  . Allergy    seasonal  . Anxiety   . Arthritis   . Balance problem   . Brain aneurysm    2 mm ACA region aneurysm by 09/21/15 MRA  . Breast cancer ([place])   . Breast cancer of lower-inner quadrant of right female breast ([place]) 08/26/2015  . Chronic kidney disease    acute renal failure one occasion  . Complication of anesthesia    heart rate drops and O2 sats drop  . Constipation   . Depression   . Diabetes mellitus without complication ([place])    not on medication at this time  . Diplopia 10/29/2017  . Dizziness 10/29/2017  . Elevated liver function tests   . Family history of adverse reaction to anesthesia    mom has n/v  . Family history of breast cancer   . Family history of colon cancer   . Fibromyalgia   . Fibromyalgia   . Gait abnormality 10/29/2017  . Genetic testing 09/08/2015   Negative genetic testing on the Breast/High Moderate Risk panel. The Breast High/Moderate Risk gene panel offered by [facility] includes sequencing and  deletion/duplication analysis of the following 9 genes: ATM, BRCA1, BRCA2, CDH1, CHEK2, PALB2, PTEN, STK11, and TP53. The report date is September 08, 2015.  The Rest of the Comprehensive Cancer panel has been reflexed and will be available in 2-3 weeks.  POLD1 c.208G>T VUS found on the Remainder of the Comprehensive cancer panel.  The Comprehensive Cancer Panel offered by [facility] includes sequencing and/or deletion duplication testing of the following 32 genes: APC, ATM, AXIN2, BARD1, BMPR1A, BRCA1, BRCA2, BRIP1, CDH1, CDK4, CDKN2A, CHEK2, EPCAM, FANCC, MLH1, MSH2, MSH6, MUTYH, NBN, PALB2, PMS2, POLD1, POLE, PTEN, RAD51C, RAD51D, SCG5/GREM1, SMAD4, STK11, TP53, VHL, and XRCC2.   The report date is 09/12/2015.   Marland Kitchen GERD (gastroesophageal reflux disease)    none recent  . H/O acute renal failure 09/22/15   admitted to [facility]  . Headache    occipital and temporal  . History of hiatal hernia   . Hyperlipemia   . Hyperlipidemia   . Hypertension   . Irritable bowel syndrome (IBS)   . MGUS (monoclonal gammopathy of unknown significance) 03/15/2016  . Neuromuscular disorder (HCC)    fibromyalgia  . Occasional tremors   . OSA (obstructive sleep apnea)    uses cpap with humidification  . Personal history of radiation  therapy   . PONV (postoperative nausea and vomiting)   . Tremor, essential 10/29/2017    Past Surgical History:  Procedure Laterality Date  . ABDOMINAL HYSTERECTOMY     adenomyosis  . ABDOMINAL HYSTERECTOMY    . BLADDER SURGERY    . BONE GRAFT HIP ILIAC CREST    . BREAST BIOPSY Bilateral   . BREAST LUMPECTOMY    . BREAST SURGERY    . CHOLECYSTECTOMY    . FRACTURE SURGERY Right    wrist  . KNEE ARTHROSCOPY    . KNEE ARTHROSCOPY W/ ACL RECONSTRUCTION     right  . RADIOACTIVE SEED GUIDED PARTIAL MASTECTOMY WITH AXILLARY SENTINEL LYMPH NODE BIOPSY Right 10/13/2015   Procedure: RADIOACTIVE SEED GUIDED PARTIAL MASTECTOMY WITH AXILLARY SENTINEL LYMPH NODE BIOPSY;  Surgeon:  Erroll Luna, MD;  Location: [facility];  Service: General;  Laterality: Right;  . WRIST SURGERY      There were no vitals filed for this visit.  Subjective Assessment - 04/11/18 1738    Subjective  Pt stated she has had a rough day today.  Stated she woke up with headache this morning.  Stated she was in office and turned to the Rt and had LOB hitting counter or else would have fallen.  Pt stated she saw neurologist Wednesday and was prescribed new medication for headaches.    Pertinent History  DX with breast and renal cell carcinoma, fibromyalgia, chronic fatigue, sleep apnea.     Patient Stated Goals  help with the headache and the balance issues     Currently in Pain?  Yes    Pain Score  6     Pain Location  Head    Pain Orientation  Posterior;Right;Left    Pain Descriptors / Indicators  Aching    Pain Type  Chronic pain    Pain Onset  More than a month ago    Pain Frequency  Constant    Aggravating Factors   bright lights    Pain Relieving Factors  blue ice, ibuprofen             Vestibular Assessment - 04/11/18 0001      Symptom Behavior   Type of Dizziness  Not applicable      Occulomotor Exam   Occulomotor Alignment  Normal    Smooth Pursuits  Saccades Lt to Rt    Saccades  Slow      Vestibulo-Occular Reflex   VOR 2 Head and Object (x 2 viewing)  negative following Epley's      Dix-Hallpike Left   Dix-Hallpike Left Duration  <10" only during Rt sidelying    Dix-Hallpike Left Symptoms  No nystagmus               [facility] Adult PT Treatment/Exercise - 04/11/18 0001      Manual Therapy   Manual Therapy  Soft tissue mobilization    Manual therapy comments  Manual complete separate than rest of tx    Soft tissue mobilization  cervical musculature to address tension and reduce headache included suboccipital release       Vestibular Treatment/Exercise - 04/11/18 0001      Vestibular Treatment/Exercise   Vestibular Treatment Provided  Canalith  Repositioning;Habituation;Gaze    Canalith Repositioning  Epley Manuever Left    Habituation Exercises  Standing Vertical Head Turns;Standing Horizontal Head Turns    Gaze Exercises  X2 Viewing Horizontal;X2 Viewing Vertical;Eye/Head Exercise Horizontal;Eye/Head Exercise Vertical       EPLEY MANUEVER LEFT  Number of Reps   2    Overall Response   Symptoms Resolved     RESPONSE DETAILS LEFT  no s/s during 2nd rounds      Standing Horizontal Head Turns   Number of Reps   10    Symptom Description   tandem stance on foam; 2 sets initially body rotation then head turns and body following 2x each directions      Standing Vertical Head Turns   Number of Reps   10    Symptom Description   tandem stance on foam      360 degree Turns   COMMENT  Able to complete body movements as well as head turns then body follows      X1 Viewing Horizontal   Foot Position  tandem stance on foam    Reps  10    Comments  cueing to reduce posterior lean      X1 Viewing Vertical   Foot Position  tandem stance on foam    Reps  10      X2 Viewing Horizontal   Foot Position  tandem stance on foam    Reps  10     Comments  cueing to reduce posterior lean; reports increased ease to turn right gaze vs lt         Balance Exercises - 04/11/18 1833      Balance Exercises: Standing   Tandem Stance  Eyes open;Foam/compliant surface;4 reps gaze then head turn 4reps tandem stance on foam    Wall Bumps  Shoulder;Hip    Wall Bumps-Shoulders  10 reps;Anterior/posterior    Wall Bumps-Hips  10 reps;Anterior/posterior    Other Standing Exercises  Gait training 229f with head turns          PT Short Term Goals - 04/08/18 05427     PT SHORT TERM GOAL #1   Title  Pt to be able to single leg stanc for 15 seconds bilaterally to reduce risk of falls     Baseline  6/18: R: 15 sec, L: 33 sec    Time  3    Status  Achieved      PT SHORT TERM GOAL #2   Title  PT to have a negative Dix  Halpike manuever to  rule our BPPV for the cause of dizziness    Time  3    Period  Weeks    Status  On-going      PT SHORT TERM GOAL #3   Title  Pt to state that her lose of balance while walking has decreased by 50%     Baseline  6/18: good days and bad days    Time  3    Period  Weeks    Status  On-going      PT SHORT TERM GOAL #4   Title  PT headaches to have decreased to 3 a week     Baseline  6/18: still has headaches every day    Time  3    Period  Weeks    Status  On-going        PT Long Term Goals - 04/08/18 00623     PT LONG TERM GOAL #1   Title  PT to be able to single leg stance for 30 seconds on both LE to improve confidence to be able to shower without using her shower chair.     Baseline  6/18: R: 15 sec, L: 33 sec  Time  6    Period  Weeks    Status  Partially Met      PT LONG TERM GOAL #2   Title  Pt to have had no headaches in the past week     Baseline  6/18: still has headaches every day    Time  6    Period  Weeks    Status  On-going      PT LONG TERM GOAL #3   Title  Pt to be confident walking by herself     Baseline  6/18: not totally confident but still does it even with her imbalance issues    Time  6    Period  Weeks    Status  On-going      PT LONG TERM GOAL #4   Title  Pt to state that her vision has improved by 50%    Baseline  6/18: feels her vision is worse    Time  6    Period  Weeks    Status  On-going            Plan - 04/11/18 1837    Clinical Impression Statement  Began session with Dix-Hallpike with noted nystagmus strong upper to the left.  Epley's maneuever complete to Lt with reports of symptoms reduced 1st set and symptoms resolved 2nd set.  Added gaze with body turns and head turns during gait today, pt able to complete with no s/s, continue with exercise next session encouraging increased speed/cadence to improve confidence with gait.  EOS with manual soft tissue mobilization to address cervical restrictions, pt reports headache  reduced to 2/10 following manual.      Rehab Potential  Good    PT Frequency  2x / week    PT Duration  6 weeks    PT Treatment/Interventions  Canalith Repostioning;Gait training;Neuromuscular re-education;Balance training;Therapeutic exercise;Manual techniques    PT Next Visit Plan  Complete Lt Diz halpike with cannalith repositioning as needed.  Continue manual to cervical mm.  Next session progress to turn movements and continue balance activities with focus on gaze, encourage increased speed with activities.  Progress to balance gait activities first static then with head turns (tandem, sidestep and retro gait).    PT Home Exercise Plan  current eye exercises; head movements; standing eye and head movements; 2 gaze/head turn       Patient will benefit from skilled therapeutic intervention in order to improve the following deficits and impairments:  Abnormal gait, Decreased activity tolerance, Decreased balance, Dizziness, Difficulty walking  Visit Diagnosis: Difficulty in walking, not elsewhere classified  BPPV (benign paroxysmal positional vertigo), bilateral  History of falling     Problem List Patient Active Problem List   Diagnosis Date Noted  . Dizziness 10/29/2017  . Gait abnormality 10/29/2017  . Tremor, essential 10/29/2017  . Diplopia 10/29/2017  . Obstructive sleep apnea on CPAP 12/10/2016  . Plantar fasciitis, bilateral 12/10/2016  . Fibromyalgia 11/09/2016  . IBS (irritable bowel syndrome) 11/09/2016  . GERD (gastroesophageal reflux disease) 11/09/2016  . Anxiety and depression 11/09/2016  . MGUS (monoclonal gammopathy of unknown significance) 03/15/2016  . Breast cancer of lower-inner quadrant of right female breast ([place]) 08/26/2015  . Headache 07/17/2015  . Hypertension 07/17/2015  . Obesity (BMI 35.0-39.9 without comorbidity) 07/17/2015   Ihor Austin, [place]; [place]  Aldona Lento 04/11/2018, 6:44 PM  [facility] [address] [place], [place], [address] Phone: [phone]  Fax:  [phone]  Name: Sarah Cooley MRN: [id number] Date of Birth: 01/09/1966

## 2018-04-14 ENCOUNTER — Other Ambulatory Visit: Payer: Self-pay | Admitting: Gastroenterology

## 2018-04-14 DIAGNOSIS — R1030 Lower abdominal pain, unspecified: Secondary | ICD-10-CM | POA: Diagnosis not present

## 2018-04-14 DIAGNOSIS — K582 Mixed irritable bowel syndrome: Secondary | ICD-10-CM | POA: Diagnosis not present

## 2018-04-16 ENCOUNTER — Encounter (HOSPITAL_COMMUNITY): Payer: Self-pay

## 2018-04-16 ENCOUNTER — Ambulatory Visit (HOSPITAL_COMMUNITY): Payer: BLUE CROSS/BLUE SHIELD

## 2018-04-16 DIAGNOSIS — R262 Difficulty in walking, not elsewhere classified: Secondary | ICD-10-CM

## 2018-04-16 DIAGNOSIS — H8113 Benign paroxysmal vertigo, bilateral: Secondary | ICD-10-CM

## 2018-04-16 DIAGNOSIS — Z9181 History of falling: Secondary | ICD-10-CM

## 2018-04-16 NOTE — Therapy (Signed)
[place], [place], [address] Phone: [phone]   Fax:  [phone]  Physical Therapy Treatment  Patient Details  Name: Sarah Cooley MRN: [id number] Date of Birth: [birth year] Referring Provider: Pablo Lawrence   Encounter Date: 04/16/2018  PT End of Session - 04/16/18 1819    Visit Number  9    Number of Visits  12    Date for PT Re-Evaluation  04/30/18 Mini-reassess complete 04/08/18    Authorization Type  BCBS    Authorization Time Period  5/29-->04/30/18    Authorization - Visit Number  9    Authorization - Number of Visits  30 30 visit limit    PT Start Time  1733    PT Stop Time  1816    PT Time Calculation (min)  43 min    Activity Tolerance  Patient tolerated treatment well;No increased pain    Behavior During Therapy  WFL for tasks assessed/performed       Past Medical History:  Diagnosis Date  . Allergy    seasonal  . Anxiety   . Arthritis   . Balance problem   . Brain aneurysm    2 mm ACA region aneurysm by 09/21/15 MRA  . Breast cancer ([place])   . Breast cancer of lower-inner quadrant of right female breast ([place]) 08/26/2015  . Chronic kidney disease    acute renal failure one occasion  . Complication of anesthesia    heart rate drops and O2 sats drop  . Constipation   . Depression   . Diabetes mellitus without complication ([place])    not on medication at this time  . Diplopia 10/29/2017  . Dizziness 10/29/2017  . Elevated liver function tests   . Family history of adverse reaction to anesthesia    mom has n/v  . Family history of breast cancer   . Family history of colon cancer   . Fibromyalgia   . Fibromyalgia   . Gait abnormality 10/29/2017  . Genetic testing 09/08/2015   Negative genetic testing on the Breast/High Moderate Risk panel. The Breast High/Moderate Risk gene panel offered by [facility] includes sequencing and deletion/duplication analysis of the following 9 genes: ATM, BRCA1,  BRCA2, CDH1, CHEK2, PALB2, PTEN, STK11, and TP53. The report date is September 08, 2015.  The Rest of the Comprehensive Cancer panel has been reflexed and will be available in 2-3 weeks.  POLD1 c.208G>T VUS found on the Remainder of the Comprehensive cancer panel.  The Comprehensive Cancer Panel offered by [facility] includes sequencing and/or deletion duplication testing of the following 32 genes: APC, ATM, AXIN2, BARD1, BMPR1A, BRCA1, BRCA2, BRIP1, CDH1, CDK4, CDKN2A, CHEK2, EPCAM, FANCC, MLH1, MSH2, MSH6, MUTYH, NBN, PALB2, PMS2, POLD1, POLE, PTEN, RAD51C, RAD51D, SCG5/GREM1, SMAD4, STK11, TP53, VHL, and XRCC2.   The report date is 09/12/2015.   Marland Kitchen GERD (gastroesophageal reflux disease)    none recent  . H/O acute renal failure 09/22/15   admitted to [facility]  . Headache    occipital and temporal  . History of hiatal hernia   . Hyperlipemia   . Hyperlipidemia   . Hypertension   . Irritable bowel syndrome (IBS)   . MGUS (monoclonal gammopathy of unknown significance) 03/15/2016  . Neuromuscular disorder (HCC)    fibromyalgia  . Occasional tremors   . OSA (obstructive sleep apnea)    uses cpap with humidification  . Personal history of radiation therapy   . PONV (postoperative nausea and vomiting)   .  Tremor, essential 10/29/2017    Past Surgical History:  Procedure Laterality Date  . ABDOMINAL HYSTERECTOMY     adenomyosis  . ABDOMINAL HYSTERECTOMY    . BLADDER SURGERY    . BONE GRAFT HIP ILIAC CREST    . BREAST BIOPSY Bilateral   . BREAST LUMPECTOMY    . BREAST SURGERY    . CHOLECYSTECTOMY    . FRACTURE SURGERY Right    wrist  . KNEE ARTHROSCOPY    . KNEE ARTHROSCOPY W/ ACL RECONSTRUCTION     right  . RADIOACTIVE SEED GUIDED PARTIAL MASTECTOMY WITH AXILLARY SENTINEL LYMPH NODE BIOPSY Right 10/13/2015   Procedure: RADIOACTIVE SEED GUIDED PARTIAL MASTECTOMY WITH AXILLARY SENTINEL LYMPH NODE BIOPSY;  Surgeon: Erroll Luna, MD;  Location: [facility];  Service: General;  Laterality:  Right;  . WRIST SURGERY      There were no vitals filed for this visit.  Subjective Assessment - 04/16/18 1735    Subjective  Pt stated she has reduced dizzy episodes, maybe 3 today with short duration.  Had bad headache this mornings, stated it has reduced with pain scale 3/10.      Pertinent History  DX with breast and renal cell carcinoma, fibromyalgia, chronic fatigue, sleep apnea.     Patient Stated Goals  help with the headache and the balance issues     Currently in Pain?  Yes    Pain Score  3     Pain Location  Head    Pain Orientation  Right;Posterior    Pain Descriptors / Indicators  Aching    Pain Type  Chronic pain    Pain Onset  More than a month ago    Pain Frequency  Intermittent    Aggravating Factors   bright lights    Pain Relieving Factors  blue ice, ibuprofen                       [facility] Adult PT Treatment/Exercise - 04/16/18 0001      Manual Therapy   Manual Therapy  Soft tissue mobilization    Manual therapy comments  Manual complete separate than rest of tx    Soft tissue mobilization  cervical musculature to address tension and reduce headache included suboccipital release       Vestibular Treatment/Exercise - 04/16/18 0001      Vestibular Treatment/Exercise   Vestibular Treatment Provided  Canalith Repositioning;Habituation;Gaze    Canalith Repositioning  Epley Manuever Left    Habituation Exercises  Standing Vertical Head Turns;Standing Horizontal Head Turns    Gaze Exercises  X2 Viewing Horizontal;X2 Viewing Vertical;Eye/Head Exercise Horizontal;Eye/Head Exercise Vertical       EPLEY MANUEVER LEFT   Number of Reps   2    Overall Response   Symptoms Resolved     RESPONSE DETAILS LEFT  no s/s durings 2nd Dix-Hallpike to the Lt      360 degree Turns   COMMENT  Increased speed both directions with no s/s with complete body movements as well as gaze then rotate         Balance Exercises - 04/16/18 1817      Balance Exercises:  Standing   Tandem Stance  Eyes open;Foam/compliant surface;3 reps 3# dowel rod flexion with gaze following then rotation    Tandem Gait  1 rep    Other Standing Exercises  Gait training 237f with head turns increased speed          PT Short Term Goals -  04/08/18 0821      PT SHORT TERM GOAL #1   Title  Pt to be able to single leg stanc for 15 seconds bilaterally to reduce risk of falls     Baseline  6/18: R: 15 sec, L: 33 sec    Time  3    Status  Achieved      PT SHORT TERM GOAL #2   Title  PT to have a negative Dix  Halpike manuever to rule our BPPV for the cause of dizziness    Time  3    Period  Weeks    Status  On-going      PT SHORT TERM GOAL #3   Title  Pt to state that her lose of balance while walking has decreased by 50%     Baseline  6/18: good days and bad days    Time  3    Period  Weeks    Status  On-going      PT SHORT TERM GOAL #4   Title  PT headaches to have decreased to 3 a week     Baseline  6/18: still has headaches every day    Time  3    Period  Weeks    Status  On-going        PT Long Term Goals - 04/08/18 6144      PT LONG TERM GOAL #1   Title  PT to be able to single leg stance for 30 seconds on both LE to improve confidence to be able to shower without using her shower chair.     Baseline  6/18: R: 15 sec, L: 33 sec    Time  6    Period  Weeks    Status  Partially Met      PT LONG TERM GOAL #2   Title  Pt to have had no headaches in the past week     Baseline  6/18: still has headaches every day    Time  6    Period  Weeks    Status  On-going      PT LONG TERM GOAL #3   Title  Pt to be confident walking by herself     Baseline  6/18: not totally confident but still does it even with her imbalance issues    Time  6    Period  Weeks    Status  On-going      PT LONG TERM GOAL #4   Title  Pt to state that her vision has improved by 50%    Baseline  6/18: feels her vision is worse    Time  6    Period  Weeks    Status   On-going            Plan - 04/17/18 0752    Clinical Impression Statement  Began session with Dix-Hallpike and Epley's maneuever complete to Lt with no symptoms on 2nd set.  Progressed gaze/habituation with increased speed during head/body turns and gait today.  Progressed balance activities with UE movements and tandem gait.  Pt able to complete whole session wiht no reports of dizziness following Epley's maneuver initially, min A with balance activities.  EOS with manual soft tissue mobilizaiton to address cervical restrictions with reports of headache resolved.      Rehab Potential  Good    PT Frequency  2x / week    PT Duration  6 weeks    PT Treatment/Interventions  Canalith Repostioning;Gait training;Neuromuscular re-education;Balance training;Therapeutic exercise;Manual techniques    PT Next Visit Plan  Complete Lt Diz halpike with cannalith repositioning as needed.  Continue manual to cervical mm.  Next session progress to turn movements and continue balance activities with focus on gaze, encourage increased speed with activities.  Progress to balance gait activities first static then with head turns (tandem, sidestep and retro gait).    PT Home Exercise Plan  current eye exercises; head movements; standing eye and head movements; 2 gaze/head turn       Patient will benefit from skilled therapeutic intervention in order to improve the following deficits and impairments:  Abnormal gait, Decreased activity tolerance, Decreased balance, Dizziness, Difficulty walking  Visit Diagnosis: Difficulty in walking, not elsewhere classified  BPPV (benign paroxysmal positional vertigo), bilateral  History of falling     Problem List Patient Active Problem List   Diagnosis Date Noted  . Dizziness 10/29/2017  . Gait abnormality 10/29/2017  . Tremor, essential 10/29/2017  . Diplopia 10/29/2017  . Obstructive sleep apnea on CPAP 12/10/2016  . Plantar fasciitis, bilateral 12/10/2016  .  Fibromyalgia 11/09/2016  . IBS (irritable bowel syndrome) 11/09/2016  . GERD (gastroesophageal reflux disease) 11/09/2016  . Anxiety and depression 11/09/2016  . MGUS (monoclonal gammopathy of unknown significance) 03/15/2016  . Breast cancer of lower-inner quadrant of right female breast ([place]) 08/26/2015  . Headache 07/17/2015  . Hypertension 07/17/2015  . Obesity (BMI 35.0-39.9 without comorbidity) 07/17/2015   Ihor Austin, [place]; [place]  Aldona Lento 04/17/2018, 7:56 AM  [place], [place], [address] Phone: [phone]   Fax:  [phone]  Name: Sarah Cooley MRN: [id number] Date of Birth: 1966-07-30

## 2018-04-18 ENCOUNTER — Ambulatory Visit (HOSPITAL_COMMUNITY): Payer: BLUE CROSS/BLUE SHIELD

## 2018-04-18 ENCOUNTER — Telehealth (HOSPITAL_COMMUNITY): Payer: Self-pay

## 2018-04-18 NOTE — Telephone Encounter (Signed)
She woke up sick today

## 2018-04-21 DIAGNOSIS — M545 Low back pain: Secondary | ICD-10-CM | POA: Diagnosis not present

## 2018-04-21 DIAGNOSIS — C649 Malignant neoplasm of unspecified kidney, except renal pelvis: Secondary | ICD-10-CM | POA: Diagnosis not present

## 2018-04-21 DIAGNOSIS — R1032 Left lower quadrant pain: Secondary | ICD-10-CM | POA: Diagnosis not present

## 2018-04-21 DIAGNOSIS — R42 Dizziness and giddiness: Secondary | ICD-10-CM | POA: Diagnosis not present

## 2018-04-22 ENCOUNTER — Telehealth: Payer: Self-pay | Admitting: Neurology

## 2018-04-22 ENCOUNTER — Encounter (HOSPITAL_COMMUNITY): Payer: Self-pay

## 2018-04-22 ENCOUNTER — Ambulatory Visit: Payer: BLUE CROSS/BLUE SHIELD | Admitting: Neurology

## 2018-04-22 ENCOUNTER — Ambulatory Visit (HOSPITAL_COMMUNITY): Payer: BLUE CROSS/BLUE SHIELD | Attending: Adult Health Nurse Practitioner

## 2018-04-22 DIAGNOSIS — Z9181 History of falling: Secondary | ICD-10-CM | POA: Diagnosis present

## 2018-04-22 DIAGNOSIS — R262 Difficulty in walking, not elsewhere classified: Secondary | ICD-10-CM | POA: Insufficient documentation

## 2018-04-22 DIAGNOSIS — H8113 Benign paroxysmal vertigo, bilateral: Secondary | ICD-10-CM | POA: Diagnosis present

## 2018-04-22 DIAGNOSIS — R55 Syncope and collapse: Secondary | ICD-10-CM | POA: Diagnosis not present

## 2018-04-22 NOTE — Therapy (Signed)
[place], [place], [address] Phone: [phone]   Fax:  [phone]  Physical Therapy Treatment  Patient Details  Name: Sarah Cooley MRN: [id number] Date of Birth: 07-Mar-1966 Referring Provider: Pablo Lawrence   Encounter Date: 04/22/2018  PT End of Session - 04/22/18 1742    Visit Number  10    Number of Visits  12    Date for PT Re-Evaluation  04/30/18 Minireassess complete 04/08/18    Authorization Type  BCBS    Authorization Time Period  5/29-->04/30/18    Authorization - Visit Number  10    Authorization - Number of Visits  30 30 visit limit    PT Start Time  2549    PT Stop Time  1825    PT Time Calculation (min)  50 min    Equipment Utilized During Treatment  Gait belt    Activity Tolerance  Patient tolerated treatment well;No increased pain    Behavior During Therapy  WFL for tasks assessed/performed       Past Medical History:  Diagnosis Date  . Allergy    seasonal  . Anxiety   . Arthritis   . Balance problem   . Brain aneurysm    2 mm ACA region aneurysm by 09/21/15 MRA  . Breast cancer ([place])   . Breast cancer of lower-inner quadrant of right female breast ([place]) 08/26/2015  . Chronic kidney disease    acute renal failure one occasion  . Complication of anesthesia    heart rate drops and O2 sats drop  . Constipation   . Depression   . Diabetes mellitus without complication ([place])    not on medication at this time  . Diplopia 10/29/2017  . Dizziness 10/29/2017  . Elevated liver function tests   . Family history of adverse reaction to anesthesia    mom has n/v  . Family history of breast cancer   . Family history of colon cancer   . Fibromyalgia   . Fibromyalgia   . Gait abnormality 10/29/2017  . Genetic testing 09/08/2015   Negative genetic testing on the Breast/High Moderate Risk panel. The Breast High/Moderate Risk gene panel offered by [facility] includes sequencing and  deletion/duplication analysis of the following 9 genes: ATM, BRCA1, BRCA2, CDH1, CHEK2, PALB2, PTEN, STK11, and TP53. The report date is September 08, 2015.  The Rest of the Comprehensive Cancer panel has been reflexed and will be available in 2-3 weeks.  POLD1 c.208G>T VUS found on the Remainder of the Comprehensive cancer panel.  The Comprehensive Cancer Panel offered by [facility] includes sequencing and/or deletion duplication testing of the following 32 genes: APC, ATM, AXIN2, BARD1, BMPR1A, BRCA1, BRCA2, BRIP1, CDH1, CDK4, CDKN2A, CHEK2, EPCAM, FANCC, MLH1, MSH2, MSH6, MUTYH, NBN, PALB2, PMS2, POLD1, POLE, PTEN, RAD51C, RAD51D, SCG5/GREM1, SMAD4, STK11, TP53, VHL, and XRCC2.   The report date is 09/12/2015.   Marland Kitchen GERD (gastroesophageal reflux disease)    none recent  . H/O acute renal failure 09/22/15   admitted to [facility]  . Headache    occipital and temporal  . History of hiatal hernia   . Hyperlipemia   . Hyperlipidemia   . Hypertension   . Irritable bowel syndrome (IBS)   . MGUS (monoclonal gammopathy of unknown significance) 03/15/2016  . Neuromuscular disorder (HCC)    fibromyalgia  . Occasional tremors   . OSA (obstructive sleep apnea)    uses cpap with humidification  . Personal history of  radiation therapy   . PONV (postoperative nausea and vomiting)   . Tremor, essential 10/29/2017    Past Surgical History:  Procedure Laterality Date  . ABDOMINAL HYSTERECTOMY     adenomyosis  . ABDOMINAL HYSTERECTOMY    . BLADDER SURGERY    . BONE GRAFT HIP ILIAC CREST    . BREAST BIOPSY Bilateral   . BREAST LUMPECTOMY    . BREAST SURGERY    . CHOLECYSTECTOMY    . FRACTURE SURGERY Right    wrist  . KNEE ARTHROSCOPY    . KNEE ARTHROSCOPY W/ ACL RECONSTRUCTION     right  . RADIOACTIVE SEED GUIDED PARTIAL MASTECTOMY WITH AXILLARY SENTINEL LYMPH NODE BIOPSY Right 10/13/2015   Procedure: RADIOACTIVE SEED GUIDED PARTIAL MASTECTOMY WITH AXILLARY SENTINEL LYMPH NODE BIOPSY;  Surgeon:  Erroll Luna, MD;  Location: [place];  Service: General;  Laterality: Right;  . WRIST SURGERY      There were no vitals filed for this visit.  Subjective Assessment - 04/22/18 1738    Subjective  Pt stated she had EEG complete earlier today.  Reports she began to twitch with the ligthing.  Reports she has bad headache on posterior side of head.  No reports of dizziness today and 1-2 episodes in the last week.    Pertinent History  DX with breast and renal cell carcinoma, fibromyalgia, chronic fatigue, sleep apnea.     Patient Stated Goals  help with the headache and the balance issues     Currently in Pain?  Yes    Pain Score  10-Worst pain ever    Pain Location  Head    Pain Orientation  Posterior;Right;Left    Pain Descriptors / Indicators  Aching    Pain Type  Chronic pain    Pain Onset  More than a month ago    Pain Frequency  Intermittent    Aggravating Factors   bright ligths    Pain Relieving Factors  blue ice, ibuprofen                       [facility] Adult PT Treatment/Exercise - 04/22/18 0001      Manual Therapy   Manual Therapy  Soft tissue mobilization    Manual therapy comments  Manual complete separate than rest of tx    Soft tissue mobilization  cervical musculature to address tension and reduce headache included suboccipital release           Balance Exercises - 04/22/18 1747      Balance Exercises: Standing   Tandem Stance  Eyes open;Foam/compliant surface head turns 10x with increased speed then UE (flex and rotate    SLS  3 reps    Gait with Head Turns  Forward;Retro;2 reps    Tandem Gait  2 reps with head turns    Retro Gait  2 reps;Head turns    Sidestepping  Head turns;2 reps;Theraband    Turning  Right;Left 2 sets first body turns then head turn and body follow CW/CC    Other Standing Exercises  body rotation 2sets; 2nd with head turn then body          PT Short Term Goals - 04/08/18 2979      PT SHORT TERM GOAL #1   Title  Pt  to be able to single leg stanc for 15 seconds bilaterally to reduce risk of falls     Baseline  6/18: R: 15 sec, L: 33 sec  Time  3    Status  Achieved      PT SHORT TERM GOAL #2   Title  PT to have a negative Dix  Halpike manuever to rule our BPPV for the cause of dizziness    Time  3    Period  Weeks    Status  On-going      PT SHORT TERM GOAL #3   Title  Pt to state that her lose of balance while walking has decreased by 50%     Baseline  6/18: good days and bad days    Time  3    Period  Weeks    Status  On-going      PT SHORT TERM GOAL #4   Title  PT headaches to have decreased to 3 a week     Baseline  6/18: still has headaches every day    Time  3    Period  Weeks    Status  On-going        PT Long Term Goals - 04/08/18 2248      PT LONG TERM GOAL #1   Title  PT to be able to single leg stance for 30 seconds on both LE to improve confidence to be able to shower without using her shower chair.     Baseline  6/18: R: 15 sec, L: 33 sec    Time  6    Period  Weeks    Status  Partially Met      PT LONG TERM GOAL #2   Title  Pt to have had no headaches in the past week     Baseline  6/18: still has headaches every day    Time  6    Period  Weeks    Status  On-going      PT LONG TERM GOAL #3   Title  Pt to be confident walking by herself     Baseline  6/18: not totally confident but still does it even with her imbalance issues    Time  6    Period  Weeks    Status  On-going      PT LONG TERM GOAL #4   Title  Pt to state that her vision has improved by 50%    Baseline  6/18: feels her vision is worse    Time  6    Period  Weeks    Status  On-going            Plan - 04/22/18 1826    Clinical Impression Statement  Pt arrived wiht reports of decreased dizzy episodes to 1-2 last week.  No manual technques required for dizziness this session.  Pt given paperwork to complete positional vertigo for left and educated technqiue with verbal understanding to  complete at home if she has episode.  Session focus with balance activities with gaze/habituation activites with no c/o dizziness through session.  Pt able to increase speed with head/body turns and gait as well as head movements during tandem, sidestep and retro gait.  No LOB episdes through session.  EOS with manual to address soft tissue restrictions in cervical region for pain control, pt reports decrease headache to 5/10 pain scale.      Rehab Potential  Good    PT Frequency  2x / week    PT Duration  6 weeks    PT Treatment/Interventions  Canalith Repostioning;Gait training;Neuromuscular re-education;Balance training;Therapeutic exercise;Manual techniques    PT Next  Visit Plan  Progress to balance beam activities, then head turns PRN.  Complete Lt Diz halpike with cannalith repositioning as needed.  Manual to cervical mm for pain control and cervical mobility.      PT Home Exercise Plan  current eye exercises; head movements; standing eye and head movements; 2 gaze/head turn; 7/2: self-treatment for positional vertigo (left) paperwork given       Patient will benefit from skilled therapeutic intervention in order to improve the following deficits and impairments:  Abnormal gait, Decreased activity tolerance, Decreased balance, Dizziness, Difficulty walking  Visit Diagnosis: Difficulty in walking, not elsewhere classified  BPPV (benign paroxysmal positional vertigo), bilateral  History of falling     Problem List Patient Active Problem List   Diagnosis Date Noted  . Dizziness 10/29/2017  . Gait abnormality 10/29/2017  . Tremor, essential 10/29/2017  . Diplopia 10/29/2017  . Obstructive sleep apnea on CPAP 12/10/2016  . Plantar fasciitis, bilateral 12/10/2016  . Fibromyalgia 11/09/2016  . IBS (irritable bowel syndrome) 11/09/2016  . GERD (gastroesophageal reflux disease) 11/09/2016  . Anxiety and depression 11/09/2016  . MGUS (monoclonal gammopathy of unknown significance)  03/15/2016  . Breast cancer of lower-inner quadrant of right female breast ([place]) 08/26/2015  . Headache 07/17/2015  . Hypertension 07/17/2015  . Obesity (BMI 35.0-39.9 without comorbidity) 07/17/2015   Ihor Austin, [place]; [place]  Aldona Lento 04/22/2018, 6:36 PM  [place], [place], [address] Phone: [phone]   Fax:  [phone]  Name: Sarah Cooley MRN: [id number] Date of Birth: [birth year]

## 2018-04-22 NOTE — Procedures (Signed)
    History:  Sarah Cooley is a 52 year old patient with a fall down a flight of stairs resulting in a concussion.  The patient is not sure whether she blacked out and then fell or fell and then blacked out.  The patient is being evaluated for this issue.  This is a routine EEG.  No skull defects are noted.  Medications include Coreg, Hy[place], clonazepam, Cardizem, Prozac, Protonix, potassium supplementation, Pravachol, vitamin D supplementation, and Topamax.  EEG classification: Normal awake  Description of the recording: The background rhythms of this recording consists of a fairly well modulated medium amplitude alpha rhythm of 9 Hz that is reactive to eye opening and closure. As the record progresses, the patient appears to remain in the waking state throughout the recording. Photic stimulation was performed, resulting in a bilateral and symmetric photic driving response. Hyperventilation was also performed, resulting in a minimal buildup of the background rhythm activities without significant slowing seen. At no time during the recording does there appear to be evidence of spike or spike wave discharges or evidence of focal slowing. EKG monitor shows no evidence of cardiac rhythm abnormalities with a heart rate of 60.  Impression: This is a normal EEG recording in the waking state. No evidence of ictal or interictal discharges are seen.

## 2018-04-22 NOTE — Telephone Encounter (Signed)
I called the patient.  The EEG study was normal. 

## 2018-04-23 ENCOUNTER — Encounter (HOSPITAL_COMMUNITY): Payer: Self-pay

## 2018-04-23 ENCOUNTER — Ambulatory Visit (HOSPITAL_COMMUNITY): Payer: BLUE CROSS/BLUE SHIELD

## 2018-04-23 VITALS — BP 121/80 | HR 60

## 2018-04-23 DIAGNOSIS — Z9181 History of falling: Secondary | ICD-10-CM

## 2018-04-23 DIAGNOSIS — R262 Difficulty in walking, not elsewhere classified: Secondary | ICD-10-CM

## 2018-04-23 DIAGNOSIS — H8113 Benign paroxysmal vertigo, bilateral: Secondary | ICD-10-CM | POA: Diagnosis not present

## 2018-04-23 NOTE — Therapy (Signed)
[place], [place], [address] Phone: [phone]   Fax:  [phone]  Physical Therapy Treatment  Patient Details  Name: Sarah Cooley MRN: [id number] Date of Birth: [birth year] Referring Provider: Pablo Lawrence   Encounter Date: 04/23/2018  PT End of Session - 04/23/18 1833    Visit Number  11    Number of Visits  12    Date for PT Re-Evaluation  04/30/18 Mini-reassessment complete 04/08/18    Authorization Type  BCBS    Authorization Time Period  5/29-->04/30/18    Authorization - Visit Number  11    Authorization - Number of Visits  30 30 visit limit    PT Start Time  1742    PT Stop Time  1884    PT Time Calculation (min)  48 min    Activity Tolerance  Patient tolerated treatment well;No increased pain    Behavior During Therapy  WFL for tasks assessed/performed       Past Medical History:  Diagnosis Date  . Allergy    seasonal  . Anxiety   . Arthritis   . Balance problem   . Brain aneurysm    2 mm ACA region aneurysm by 09/21/15 MRA  . Breast cancer ([place])   . Breast cancer of lower-inner quadrant of right female breast ([place]) 08/26/2015  . Chronic kidney disease    acute renal failure one occasion  . Complication of anesthesia    heart rate drops and O2 sats drop  . Constipation   . Depression   . Diabetes mellitus without complication ([place])    not on medication at this time  . Diplopia 10/29/2017  . Dizziness 10/29/2017  . Elevated liver function tests   . Family history of adverse reaction to anesthesia    mom has n/v  . Family history of breast cancer   . Family history of colon cancer   . Fibromyalgia   . Fibromyalgia   . Gait abnormality 10/29/2017  . Genetic testing 09/08/2015   Negative genetic testing on the Breast/High Moderate Risk panel. The Breast High/Moderate Risk gene panel offered by [facility] includes sequencing and deletion/duplication analysis of the following 9 genes: ATM,  BRCA1, BRCA2, CDH1, CHEK2, PALB2, PTEN, STK11, and TP53. The report date is September 08, 2015.  The Rest of the Comprehensive Cancer panel has been reflexed and will be available in 2-3 weeks.  POLD1 c.208G>T VUS found on the Remainder of the Comprehensive cancer panel.  The Comprehensive Cancer Panel offered by [facility] includes sequencing and/or deletion duplication testing of the following 32 genes: APC, ATM, AXIN2, BARD1, BMPR1A, BRCA1, BRCA2, BRIP1, CDH1, CDK4, CDKN2A, CHEK2, EPCAM, FANCC, MLH1, MSH2, MSH6, MUTYH, NBN, PALB2, PMS2, POLD1, POLE, PTEN, RAD51C, RAD51D, SCG5/GREM1, SMAD4, STK11, TP53, VHL, and XRCC2.   The report date is 09/12/2015.   Marland Kitchen GERD (gastroesophageal reflux disease)    none recent  . H/O acute renal failure 09/22/15   admitted to [facility]  . Headache    occipital and temporal  . History of hiatal hernia   . Hyperlipemia   . Hyperlipidemia   . Hypertension   . Irritable bowel syndrome (IBS)   . MGUS (monoclonal gammopathy of unknown significance) 03/15/2016  . Neuromuscular disorder (HCC)    fibromyalgia  . Occasional tremors   . OSA (obstructive sleep apnea)    uses cpap with humidification  . Personal history of radiation therapy   . PONV (postoperative nausea and vomiting)   .  Tremor, essential 10/29/2017    Past Surgical History:  Procedure Laterality Date  . ABDOMINAL HYSTERECTOMY     adenomyosis  . ABDOMINAL HYSTERECTOMY    . BLADDER SURGERY    . BONE GRAFT HIP ILIAC CREST    . BREAST BIOPSY Bilateral   . BREAST LUMPECTOMY    . BREAST SURGERY    . CHOLECYSTECTOMY    . FRACTURE SURGERY Right    wrist  . KNEE ARTHROSCOPY    . KNEE ARTHROSCOPY W/ ACL RECONSTRUCTION     right  . RADIOACTIVE SEED GUIDED PARTIAL MASTECTOMY WITH AXILLARY SENTINEL LYMPH NODE BIOPSY Right 10/13/2015   Procedure: RADIOACTIVE SEED GUIDED PARTIAL MASTECTOMY WITH AXILLARY SENTINEL LYMPH NODE BIOPSY;  Surgeon: Erroll Luna, MD;  Location: [facility] [place];  Service: General;   Laterality: Right;  . WRIST SURGERY      Vitals:   04/23/18 1745  BP: 121/80  Pulse: 60    Subjective Assessment - 04/23/18 1745    Subjective  Pt stated she received call from MD and EEG testing was normal.  Reports she has some palpation heart beats today and some lightheadedness.  Stated she started to have headache today and took half a pill with headache pain at 4/10.      Pertinent History  DX with breast and renal cell carcinoma, fibromyalgia, chronic fatigue, sleep apnea.     Patient Stated Goals  help with the headache and the balance issues     Currently in Pain?  Yes    Pain Score  4     Pain Location  Head    Pain Orientation  Posterior;Right    Pain Descriptors / Indicators  Aching    Pain Type  Chronic pain    Pain Onset  More than a month ago    Pain Frequency  Intermittent    Aggravating Factors   bright lights    Pain Relieving Factors  blue ice, ibuprofen    Effect of Pain on Daily Activities  limits         [facility] PT Assessment - 04/23/18 0001      Assessment   Medical Diagnosis  vertigo     Referring Provider  Pablo Lawrence    Onset Date/Surgical Date  09/05/17    Hand Dominance  Right    Next MD Visit  not scheduled with Pablo Lawrence, sees Dr. Jannifer Franklin the neurologist 04/09/18    Prior Therapy  [place]       Precautions   Precautions  Fall      Balance   Balance Assessed  Yes      Functional Gait  Assessment   Gait assessed   Yes    Gait Level Surface  Walks 20 ft in less than 5.5 sec, no assistive devices, good speed, no evidence for imbalance, normal gait pattern, deviates no more than 6 in outside of the 12 in walkway width.    Change in Gait Speed  Able to smoothly change walking speed without loss of balance or gait deviation. Deviate no more than 6 in outside of the 12 in walkway width.    Gait with Horizontal Head Turns  Performs head turns smoothly with no change in gait. Deviates no more than 6 in outside 12 in walkway width    Gait  with Vertical Head Turns  Performs head turns with no change in gait. Deviates no more than 6 in outside 12 in walkway width.    Gait and Pivot Turn  Pivot turns safely within 3 sec and stops quickly with no loss of balance.    Step Over Obstacle  Is able to step over 2 stacked shoe boxes taped together (9 in total height) without changing gait speed. No evidence of imbalance.    Gait with Narrow Base of Support  Is able to ambulate for 10 steps heel to toe with no staggering.    Gait with Eyes Closed  -- not assessed    Ambulating Backwards  Walks 20 ft, no assistive devices, good speed, no evidence for imbalance, normal gait    Steps  Alternating feet, must use rail.                   [place] Adult PT Treatment/Exercise - 04/23/18 0001      Manual Therapy   Manual Therapy  Soft tissue mobilization    Manual therapy comments  Manual complete separate than rest of tx    Soft tissue mobilization  cervical musculature to address tension and reduce headache included suboccipital release           Balance Exercises - 04/23/18 1837      Balance Exercises: Standing   Tandem Stance  Eyes open;2 reps 1st: palvo 2nd: head turns    SLS with Vectors  Solid surface;3 reps 3x5" BLE no HHA    Balance Beam  tandem, retro and sidestep with head turns    Gait with Head Turns  Forward;Retro DGI activities    Other Standing Exercises  body rotation 2sets; 2nd with head turn then body          PT Short Term Goals - 04/08/18 3845      PT SHORT TERM GOAL #1   Title  Pt to be able to single leg stanc for 15 seconds bilaterally to reduce risk of falls     Baseline  6/18: R: 15 sec, L: 33 sec    Time  3    Status  Achieved      PT SHORT TERM GOAL #2   Title  PT to have a negative Dix  Halpike manuever to rule our BPPV for the cause of dizziness    Time  3    Period  Weeks    Status  On-going      PT SHORT TERM GOAL #3   Title  Pt to state that her lose of balance while walking has  decreased by 50%     Baseline  6/18: good days and bad days    Time  3    Period  Weeks    Status  On-going      PT SHORT TERM GOAL #4   Title  PT headaches to have decreased to 3 a week     Baseline  6/18: still has headaches every day    Time  3    Period  Weeks    Status  On-going        PT Long Term Goals - 04/08/18 3646      PT LONG TERM GOAL #1   Title  PT to be able to single leg stance for 30 seconds on both LE to improve confidence to be able to shower without using her shower chair.     Baseline  6/18: R: 15 sec, L: 33 sec    Time  6    Period  Weeks    Status  Partially Met      PT LONG TERM GOAL #  2   Title  Pt to have had no headaches in the past week     Baseline  6/18: still has headaches every day    Time  6    Period  Weeks    Status  On-going      PT LONG TERM GOAL #3   Title  Pt to be confident walking by herself     Baseline  6/18: not totally confident but still does it even with her imbalance issues    Time  6    Period  Weeks    Status  On-going      PT LONG TERM GOAL #4   Title  Pt to state that her vision has improved by 50%    Baseline  6/18: feels her vision is worse    Time  6    Period  Weeks    Status  On-going            Plan - 04/23/18 1834    Clinical Impression Statement  Pt arrived with reports of feeling a little light headed and somewhat "tingly" around head, stated not dizziness though.  Assessed vitals following report of light headedness, BP at 121/82 and HR at 60 mmHg.  Session focus on balance activities, progressed to balance beam including tandem, retro and sidestep with head turns.  Also added vector stance and DGI activities for gazeing and balance training, no LOB or change in speed.  No reports of dizziness through session.  EOS with manual soft tissue mobilization to address restrictions to assist with head mobility and reduce headaches.  Pt given printout of massage therapists in the area following discussion with  majority of her STG/LTGs met.      Rehab Potential  Good    PT Frequency  2x / week    PT Duration  6 weeks    PT Treatment/Interventions  Canalith Repostioning;Gait training;Neuromuscular re-education;Balance training;Therapeutic exercise;Manual techniques    PT Next Visit Plan  Reassess next session.      PT Home Exercise Plan  current eye exercises; head movements; standing eye and head movements; 2 gaze/head turn; 7/2: self-treatment for positional vertigo (left) paperwork given       Patient will benefit from skilled therapeutic intervention in order to improve the following deficits and impairments:  Abnormal gait, Decreased activity tolerance, Decreased balance, Dizziness, Difficulty walking  Visit Diagnosis: Difficulty in walking, not elsewhere classified  BPPV (benign paroxysmal positional vertigo), bilateral  History of falling     Problem List Patient Active Problem List   Diagnosis Date Noted  . Dizziness 10/29/2017  . Gait abnormality 10/29/2017  . Tremor, essential 10/29/2017  . Diplopia 10/29/2017  . Obstructive sleep apnea on CPAP 12/10/2016  . Plantar fasciitis, bilateral 12/10/2016  . Fibromyalgia 11/09/2016  . IBS (irritable bowel syndrome) 11/09/2016  . GERD (gastroesophageal reflux disease) 11/09/2016  . Anxiety and depression 11/09/2016  . MGUS (monoclonal gammopathy of unknown significance) 03/15/2016  . Breast cancer of lower-inner quadrant of right female breast ([place]) 08/26/2015  . Headache 07/17/2015  . Hypertension 07/17/2015  . Obesity (BMI 35.0-39.9 without comorbidity) 07/17/2015   Ihor Austin, [place]; [place]  Aldona Lento 04/23/2018, 6:46 PM  [place], [place], [address] Phone: [phone]   Fax:  [phone]  Name: Sarah Cooley MRN: [id number] Date of Birth: 1966-08-03

## 2018-04-28 ENCOUNTER — Telehealth: Payer: Self-pay | Admitting: Neurology

## 2018-04-28 NOTE — Telephone Encounter (Signed)
Called pt back. Relayed message below. She verbalized understanding and states that is what Dr. Jannifer Franklin said. She is concerned about event that occurred during strobe lights. She is wondering why this occurred. She has not had any reoccurring events. I reassured pt it was not seizure activity. Advised her to continue to monitor and write down any further events. She requested message be sent to Dr. Jannifer Franklin for when he returns to make him aware.

## 2018-04-28 NOTE — Telephone Encounter (Signed)
Regarding EEG she had, during the test with the strobe lights her arms began to shake uncontrollably. I asked her if the tech noticed anything, she said he didn't say anything. She is wanting to if this normal behavior?Please call to advise.

## 2018-04-28 NOTE — Telephone Encounter (Signed)
I called the patient.  The EEG study was normal, the patient likely had myoclonus, not a true seizure event.  She happens to be on clonazepam which should tone this reaction down.  She is also getting on Topamax for her headache.

## 2018-04-28 NOTE — Telephone Encounter (Signed)
Dr. Krista Blue reviewed patient's chart. Dr. Jannifer Franklin' note from EEG showed no seizure activity. Limb movement not correlated  With seizure activity. EEG was normal

## 2018-04-29 ENCOUNTER — Ambulatory Visit (HOSPITAL_COMMUNITY): Payer: BLUE CROSS/BLUE SHIELD

## 2018-04-29 DIAGNOSIS — H8113 Benign paroxysmal vertigo, bilateral: Secondary | ICD-10-CM | POA: Diagnosis not present

## 2018-04-29 DIAGNOSIS — Z9181 History of falling: Secondary | ICD-10-CM | POA: Diagnosis not present

## 2018-04-29 DIAGNOSIS — R262 Difficulty in walking, not elsewhere classified: Secondary | ICD-10-CM

## 2018-04-29 NOTE — Therapy (Signed)
[place], [place], [address] Phone: [phone]   Fax:  [phone]  Physical Therapy Treatment/Discharge Summary  Patient Details  Name: Sarah Cooley MRN: [id number] Date of Birth: 30-Dec-1965 Referring Provider: Pablo Lawrence   Encounter Date: 04/29/2018  PT End of Session - 04/29/18 1516    Visit Number  12    Number of Visits  12    Date for PT Re-Evaluation  04/30/18 Mini-reassessment complete 04/08/18    Authorization Type  [employer]    Authorization Time Period  5/29-->04/30/18    Authorization - Visit Number  12    Authorization - Number of Visits  30 30 visit limit    PT Start Time  1696    PT Stop Time  1550    PT Time Calculation (min)  34 min    Activity Tolerance  Patient tolerated treatment well;No increased pain    Behavior During Therapy  WFL for tasks assessed/performed       Past Medical History:  Diagnosis Date  . Allergy    seasonal  . Anxiety   . Arthritis   . Balance problem   . Brain aneurysm    2 mm ACA region aneurysm by 09/21/15 MRA  . Breast cancer ([facility])   . Breast cancer of lower-inner quadrant of right female breast ([place]) 08/26/2015  . Chronic kidney disease    acute renal failure one occasion  . Complication of anesthesia    heart rate drops and O2 sats drop  . Constipation   . Depression   . Diabetes mellitus without complication ([facility])    not on medication at this time  . Diplopia 10/29/2017  . Dizziness 10/29/2017  . Elevated liver function tests   . Family history of adverse reaction to anesthesia    mom has n/v  . Family history of breast cancer   . Family history of colon cancer   . Fibromyalgia   . Fibromyalgia   . Gait abnormality 10/29/2017  . Genetic testing 09/08/2015   Negative genetic testing on the Breast/High Moderate Risk panel. The Breast High/Moderate Risk gene panel offered by [facility] includes sequencing and deletion/duplication analysis of the following  9 genes: ATM, BRCA1, BRCA2, CDH1, CHEK2, PALB2, PTEN, STK11, and TP53. The report date is September 08, 2015.  The Rest of the Comprehensive Cancer panel has been reflexed and will be available in 2-3 weeks.  POLD1 c.208G>T VUS found on the Remainder of the Comprehensive cancer panel.  The Comprehensive Cancer Panel offered by [facility] includes sequencing and/or deletion duplication testing of the following 32 genes: APC, ATM, AXIN2, BARD1, BMPR1A, BRCA1, BRCA2, BRIP1, CDH1, CDK4, CDKN2A, CHEK2, EPCAM, FANCC, MLH1, MSH2, MSH6, MUTYH, NBN, PALB2, PMS2, POLD1, POLE, PTEN, RAD51C, RAD51D, SCG5/GREM1, SMAD4, STK11, TP53, VHL, and XRCC2.   The report date is 09/12/2015.   Marland Kitchen GERD (gastroesophageal reflux disease)    none recent  . H/O acute renal failure 09/22/15   admitted to [facility]  . Headache    occipital and temporal  . History of hiatal hernia   . Hyperlipemia   . Hyperlipidemia   . Hypertension   . Irritable bowel syndrome (IBS)   . MGUS (monoclonal gammopathy of unknown significance) 03/15/2016  . Neuromuscular disorder ([facility])    fibromyalgia  . Occasional tremors   . OSA (obstructive sleep apnea)    uses cpap with humidification  . Personal history of radiation therapy   . PONV (postoperative nausea and  vomiting)   . Tremor, essential 10/29/2017    Past Surgical History:  Procedure Laterality Date  . ABDOMINAL HYSTERECTOMY     adenomyosis  . ABDOMINAL HYSTERECTOMY    . BLADDER SURGERY    . BONE GRAFT HIP ILIAC CREST    . BREAST BIOPSY Bilateral   . BREAST LUMPECTOMY    . BREAST SURGERY    . CHOLECYSTECTOMY    . FRACTURE SURGERY Right    wrist  . KNEE ARTHROSCOPY    . KNEE ARTHROSCOPY W/ ACL RECONSTRUCTION     right  . RADIOACTIVE SEED GUIDED PARTIAL MASTECTOMY WITH AXILLARY SENTINEL LYMPH NODE BIOPSY Right 10/13/2015   Procedure: RADIOACTIVE SEED GUIDED PARTIAL MASTECTOMY WITH AXILLARY SENTINEL LYMPH NODE BIOPSY;  Surgeon: Erroll Luna, MD;  Location: [facility];  Service:  General;  Laterality: Right;  . WRIST SURGERY      There were no vitals filed for this visit.  Subjective Assessment - 04/29/18 1517    Subjective  Pt reports that she's doing really well. She reports a significant improvement in symptoms since her initial eval. Her vision is still blurry and headaches and balance issues. She reports that her dizziness is no more than 1-2x/week, whereas it was 3-5x/day prior to her eval.    Pertinent History  DX with breast and renal cell carcinoma, fibromyalgia, chronic fatigue, sleep apnea.     Patient Stated Goals  help with the headache and the balance issues     Currently in Pain?  No/denies    Pain Onset  More than a month ago         [facility] PT Assessment - 04/29/18 0001      Assessment   Medical Diagnosis  vertigo       Balance   Balance Assessed  Yes      Static Standing Balance   Static Standing - Balance Support  No upper extremity supported    Static Standing Balance -  Activities   Single Leg Stance - Right Leg;Single Leg Stance - Left Leg    Static Standing - Comment/# of Minutes  R: 17sec L: 23 sec         PT Education - 04/29/18 1517    Education provided  Yes    Education Details  reassessment findings    Person(s) Educated  Patient    Methods  Explanation    Comprehension  Verbalized understanding       PT Short Term Goals - 04/29/18 1521      PT SHORT TERM GOAL #1   Title  Pt to be able to single leg stanc for 15 seconds bilaterally to reduce risk of falls     Baseline  7/9: 17 sec R, 23sec L    Time  3    Status  Achieved      PT SHORT TERM GOAL #2   Title  PT to have a negative Dix  Halpike manuever to rule our BPPV for the cause of dizziness    Time  3    Period  Weeks    Status  Achieved      PT SHORT TERM GOAL #3   Title  Pt to state that her lose of balance while walking has decreased by 50%     Baseline  7/9: agrees with this statement, feels she only loses her balance once in a while when walking     Time  3    Period  Weeks    Status  Achieved      PT SHORT TERM GOAL #4   Title  PT headaches to have decreased to 3 a week     Baseline  7/9: still has headaches every day    Time  3    Period  Weeks    Status  On-going        PT Long Term Goals - 04/29/18 1525      PT LONG TERM GOAL #1   Title  PT to be able to single leg stance for 30 seconds on both LE to improve confidence to be able to shower without using her shower chair.     Baseline  7/9: R: 17 sec, L: 23 sec today, was 33sec last reassessment    Time  6    Period  Weeks    Status  Partially Met      PT LONG TERM GOAL #2   Title  Pt to have had no headaches in the past week     Baseline  7/9: still has headaches every day    Time  6    Period  Weeks    Status  On-going      PT LONG TERM GOAL #3   Title  Pt to be confident walking by herself     Baseline  7/9: feels confident ambulating independently    Time  6    Period  Weeks    Status  Achieved      PT LONG TERM GOAL #4   Title  Pt to state that her vision has improved by 50%    Baseline  7/9: no change, still blurry    Time  6    Period  Weeks    Status  On-going            Plan - 04/29/18 1554    Clinical Impression Statement  PT reassessed pt's goals and outcome measures this date. Pt has made tremendous improvements since starting therapy as illustrated by her significant decrease in dizziness, balance, and overall functional mobility. Pt now feels confident ambulating independent and was negative for Dix-Hallpike today. At this time, pt is ready for discharge. Updated HEP to include functional strengthening and balance so as to maintain and continue to advance progress made.    Rehab Potential  Good    PT Frequency  2x / week    PT Duration  6 weeks    PT Treatment/Interventions  Canalith Repostioning;Gait training;Neuromuscular re-education;Balance training;Therapeutic exercise;Manual techniques    PT Next Visit Plan  discharged    PT Home  Exercise Plan  current eye exercises; head movements; standing eye and head movements; 2 gaze/head turn; 7/2: self-treatment for positional vertigo (left) paperwork given; 7/9: bridging, sidelying hip abd, standing hip abd, sidestepping, SLS, tandem stance    Consulted and Agree with Plan of Care  Patient       Patient will benefit from skilled therapeutic intervention in order to improve the following deficits and impairments:  Abnormal gait, Decreased activity tolerance, Decreased balance, Dizziness, Difficulty walking  Visit Diagnosis: Difficulty in walking, not elsewhere classified  BPPV (benign paroxysmal positional vertigo), bilateral  History of falling     Problem List Patient Active Problem List   Diagnosis Date Noted  . Dizziness 10/29/2017  . Gait abnormality 10/29/2017  . Tremor, essential 10/29/2017  . Diplopia 10/29/2017  . Obstructive sleep apnea on CPAP 12/10/2016  . Plantar fasciitis, bilateral 12/10/2016  . Fibromyalgia 11/09/2016  . IBS (irritable  bowel syndrome) 11/09/2016  . GERD (gastroesophageal reflux disease) 11/09/2016  . Anxiety and depression 11/09/2016  . MGUS (monoclonal gammopathy of unknown significance) 03/15/2016  . Breast cancer of lower-inner quadrant of right female breast ([place]) 08/26/2015  . Headache 07/17/2015  . Hypertension 07/17/2015  . Obesity (BMI 35.0-39.9 without comorbidity) 07/17/2015     PHYSICAL THERAPY DISCHARGE SUMMARY  Visits from Start of Care: 12  Current functional level related to goals / functional outcomes: See above   Remaining deficits: See above   Education / Equipment: See above  Plan: Patient agrees to discharge.  Patient goals were partially met. Patient is being discharged due to being pleased with the current functional level.  ?????       Geraldine Solar PT, [place] [address] [place], [place], [address] Phone: [phone]   Fax:   [phone]  Name: Sarah Cooley MRN: [id number] Date of Birth: 02/13/1966

## 2018-04-30 ENCOUNTER — Ambulatory Visit
Admission: RE | Admit: 2018-04-30 | Discharge: 2018-04-30 | Disposition: A | Discharge status: Home / Self Care | Payer: BLUE CROSS/BLUE SHIELD | Source: Ambulatory Visit | Attending: Gastroenterology | Admitting: Gastroenterology

## 2018-04-30 DIAGNOSIS — R1030 Lower abdominal pain, unspecified: Secondary | ICD-10-CM

## 2018-04-30 DIAGNOSIS — F332 Major depressive disorder, recurrent severe without psychotic features: Secondary | ICD-10-CM | POA: Diagnosis not present

## 2018-04-30 DIAGNOSIS — R1032 Left lower quadrant pain: Secondary | ICD-10-CM | POA: Diagnosis not present

## 2018-04-30 MED ORDER — IOPAMIDOL (ISOVUE-300) INJECTION 61%
100.0000 mL | Freq: Once | INTRAVENOUS | Status: AC | PRN
  Administered 2018-04-30: 100 mL via INTRAVENOUS

## 2018-05-01 ENCOUNTER — Encounter (HOSPITAL_COMMUNITY): Payer: BLUE CROSS/BLUE SHIELD

## 2018-05-09 DIAGNOSIS — D3000 Benign neoplasm of unspecified kidney: Secondary | ICD-10-CM | POA: Diagnosis not present

## 2018-05-13 ENCOUNTER — Other Ambulatory Visit: Payer: Self-pay | Admitting: Urology

## 2018-05-16 DIAGNOSIS — R5382 Chronic fatigue, unspecified: Secondary | ICD-10-CM | POA: Diagnosis not present

## 2018-05-16 DIAGNOSIS — M150 Primary generalized (osteo)arthritis: Secondary | ICD-10-CM | POA: Diagnosis not present

## 2018-05-16 DIAGNOSIS — M797 Fibromyalgia: Secondary | ICD-10-CM | POA: Diagnosis not present

## 2018-05-16 DIAGNOSIS — M2550 Pain in unspecified joint: Secondary | ICD-10-CM | POA: Diagnosis not present

## 2018-05-23 ENCOUNTER — Other Ambulatory Visit (HOSPITAL_COMMUNITY): Payer: Self-pay | Admitting: Internal Medicine

## 2018-05-23 ENCOUNTER — Telehealth (HOSPITAL_COMMUNITY): Payer: Self-pay | Admitting: *Deleted

## 2018-05-23 ENCOUNTER — Inpatient Hospital Stay (HOSPITAL_COMMUNITY): Payer: BLUE CROSS/BLUE SHIELD | Attending: Internal Medicine

## 2018-05-23 DIAGNOSIS — Z171 Estrogen receptor negative status [ER-]: Secondary | ICD-10-CM | POA: Diagnosis not present

## 2018-05-23 DIAGNOSIS — E876 Hypokalemia: Secondary | ICD-10-CM

## 2018-05-23 DIAGNOSIS — Z923 Personal history of irradiation: Secondary | ICD-10-CM | POA: Insufficient documentation

## 2018-05-23 DIAGNOSIS — D242 Benign neoplasm of left breast: Secondary | ICD-10-CM | POA: Diagnosis not present

## 2018-05-23 DIAGNOSIS — B9689 Other specified bacterial agents as the cause of diseases classified elsewhere: Secondary | ICD-10-CM | POA: Insufficient documentation

## 2018-05-23 DIAGNOSIS — D0511 Intraductal carcinoma in situ of right breast: Secondary | ICD-10-CM | POA: Diagnosis not present

## 2018-05-23 DIAGNOSIS — N2889 Other specified disorders of kidney and ureter: Secondary | ICD-10-CM | POA: Insufficient documentation

## 2018-05-23 LAB — CBC WITH DIFFERENTIAL/PLATELET
BASOS ABS: 0 10*3/uL (ref 0.0–0.1)
Basophils Relative: 0 %
EOS ABS: 0.1 10*3/uL (ref 0.0–0.7)
Eosinophils Relative: 1 %
HCT: 41.9 % (ref 36.0–46.0)
HEMOGLOBIN: 15.3 g/dL — AB (ref 12.0–15.0)
Lymphocytes Relative: 31 %
Lymphs Abs: 2.2 10*3/uL (ref 0.7–4.0)
MCH: 32.5 pg (ref 26.0–34.0)
MCHC: 36.5 g/dL — ABNORMAL HIGH (ref 30.0–36.0)
MCV: 89 fL (ref 78.0–100.0)
Monocytes Absolute: 0.7 10*3/uL (ref 0.1–1.0)
Monocytes Relative: 10 %
NEUTROS PCT: 58 %
Neutro Abs: 4 10*3/uL (ref 1.7–7.7)
PLATELETS: 233 10*3/uL (ref 150–400)
RBC: 4.71 MIL/uL (ref 3.87–5.11)
RDW: 12.7 % (ref 11.5–15.5)
WBC: 7 10*3/uL (ref 4.0–10.5)

## 2018-05-23 LAB — COMPREHENSIVE METABOLIC PANEL
ALBUMIN: 4 g/dL (ref 3.5–5.0)
ALK PHOS: 69 U/L (ref 38–126)
ALT: 51 U/L — AB (ref 0–44)
AST: 38 U/L (ref 15–41)
Anion gap: 12 (ref 5–15)
BUN: 12 mg/dL (ref 6–20)
CALCIUM: 9.3 mg/dL (ref 8.9–10.3)
CHLORIDE: 93 mmol/L — AB (ref 98–111)
CO2: 31 mmol/L (ref 22–32)
CREATININE: 0.92 mg/dL (ref 0.44–1.00)
GFR calc non Af Amer: >60 mL/min (ref >60)
GLUCOSE: 152 mg/dL — AB (ref 70–99)
Potassium: 2 mmol/L — CL (ref 3.5–5.1)
SODIUM: 136 mmol/L (ref 135–145)
Total Bilirubin: 0.7 mg/dL (ref 0.3–1.2)
Total Protein: 8.1 g/dL (ref 6.5–8.1)

## 2018-05-23 LAB — LACTATE DEHYDROGENASE: LDH: 176 U/L (ref 98–192)

## 2018-05-23 LAB — FERRITIN: Ferritin: 135 ng/mL (ref 11–307)

## 2018-05-23 NOTE — Progress Notes (Unsigned)
CRITICAL VALUE ALERT Critical value received:  Potassium 2.0 Date of notification:  05-23-2018 Time of notification: 4270 Critical value read back:  Yes.   Nurse who received alert:  C. Erza Mothershead RN MD notified (1st [name]): Dr. Walden Field

## 2018-05-23 NOTE — Telephone Encounter (Signed)
LMOM for pt to call us back due to her low potassium. Pt advised that she needs potassium and that we are going to try to work her in today to get this. Advised pt to call us back ASAP.

## 2018-05-23 NOTE — Telephone Encounter (Signed)
Finally spoke with pt after multiple attempts. Dr. Walden Field advised that the pt should take 60 mEq of potassium daily through the weekend and come in on Monday for an infusion. I spoke with the pt and advised her of this and she stated that " she has diarrhea and has had diarrhea after diarrhea and nausea" pt also stated that she has lost 30 pounds over the last 3 months. The pt was scheduled for an infusion on Monday as well as a Dr visit with Dr. Walden Field.

## 2018-05-24 ENCOUNTER — Other Ambulatory Visit: Payer: Self-pay

## 2018-05-24 ENCOUNTER — Inpatient Hospital Stay (HOSPITAL_COMMUNITY)
Admission: EM | Admit: 2018-05-24 | Discharge: 2018-05-29 | DRG: 641 | Disposition: A | Discharge status: Home / Self Care | Payer: BLUE CROSS/BLUE SHIELD | Source: Ambulatory Visit | Attending: Family Medicine | Admitting: Family Medicine

## 2018-05-24 ENCOUNTER — Other Ambulatory Visit (HOSPITAL_COMMUNITY): Payer: Self-pay

## 2018-05-24 ENCOUNTER — Encounter (HOSPITAL_COMMUNITY): Payer: Self-pay | Admitting: Emergency Medicine

## 2018-05-24 DIAGNOSIS — T502X5A Adverse effect of carbonic-anhydrase inhibitors, benzothiadiazides and other diuretics, initial encounter: Secondary | ICD-10-CM | POA: Diagnosis present

## 2018-05-24 DIAGNOSIS — E785 Hyperlipidemia, unspecified: Secondary | ICD-10-CM | POA: Diagnosis present

## 2018-05-24 DIAGNOSIS — E876 Hypokalemia: Secondary | ICD-10-CM | POA: Diagnosis not present

## 2018-05-24 DIAGNOSIS — Z9989 Dependence on other enabling machines and devices: Secondary | ICD-10-CM | POA: Diagnosis not present

## 2018-05-24 DIAGNOSIS — Z888 Allergy status to other drugs, medicaments and biological substances status: Secondary | ICD-10-CM | POA: Diagnosis not present

## 2018-05-24 DIAGNOSIS — R001 Bradycardia, unspecified: Secondary | ICD-10-CM | POA: Diagnosis present

## 2018-05-24 DIAGNOSIS — I4581 Long QT syndrome: Secondary | ICD-10-CM | POA: Diagnosis present

## 2018-05-24 DIAGNOSIS — A0472 Enterocolitis due to Clostridium difficile, not specified as recurrent: Secondary | ICD-10-CM | POA: Diagnosis not present

## 2018-05-24 DIAGNOSIS — Z923 Personal history of irradiation: Secondary | ICD-10-CM | POA: Diagnosis not present

## 2018-05-24 DIAGNOSIS — Z8679 Personal history of other diseases of the circulatory system: Secondary | ICD-10-CM | POA: Diagnosis not present

## 2018-05-24 DIAGNOSIS — Z79899 Other long term (current) drug therapy: Secondary | ICD-10-CM

## 2018-05-24 DIAGNOSIS — K219 Gastro-esophageal reflux disease without esophagitis: Secondary | ICD-10-CM | POA: Diagnosis present

## 2018-05-24 DIAGNOSIS — F411 Generalized anxiety disorder: Secondary | ICD-10-CM | POA: Diagnosis present

## 2018-05-24 DIAGNOSIS — C649 Malignant neoplasm of unspecified kidney, except renal pelvis: Secondary | ICD-10-CM | POA: Diagnosis present

## 2018-05-24 DIAGNOSIS — K580 Irritable bowel syndrome with diarrhea: Secondary | ICD-10-CM | POA: Diagnosis present

## 2018-05-24 DIAGNOSIS — Z91013 Allergy to seafood: Secondary | ICD-10-CM | POA: Diagnosis not present

## 2018-05-24 DIAGNOSIS — Z881 Allergy status to other antibiotic agents status: Secondary | ICD-10-CM

## 2018-05-24 DIAGNOSIS — Z91048 Other nonmedicinal substance allergy status: Secondary | ICD-10-CM

## 2018-05-24 DIAGNOSIS — R9431 Abnormal electrocardiogram [ECG] [EKG]: Secondary | ICD-10-CM | POA: Diagnosis not present

## 2018-05-24 DIAGNOSIS — G4733 Obstructive sleep apnea (adult) (pediatric): Secondary | ICD-10-CM | POA: Diagnosis not present

## 2018-05-24 DIAGNOSIS — F32A Depression, unspecified: Secondary | ICD-10-CM | POA: Diagnosis present

## 2018-05-24 DIAGNOSIS — D472 Monoclonal gammopathy: Secondary | ICD-10-CM | POA: Diagnosis not present

## 2018-05-24 DIAGNOSIS — F419 Anxiety disorder, unspecified: Secondary | ICD-10-CM | POA: Diagnosis not present

## 2018-05-24 DIAGNOSIS — I10 Essential (primary) hypertension: Secondary | ICD-10-CM | POA: Diagnosis present

## 2018-05-24 DIAGNOSIS — Z853 Personal history of malignant neoplasm of breast: Secondary | ICD-10-CM

## 2018-05-24 DIAGNOSIS — Z9049 Acquired absence of other specified parts of digestive tract: Secondary | ICD-10-CM

## 2018-05-24 DIAGNOSIS — Z882 Allergy status to sulfonamides status: Secondary | ICD-10-CM

## 2018-05-24 DIAGNOSIS — R251 Tremor, unspecified: Secondary | ICD-10-CM | POA: Diagnosis not present

## 2018-05-24 DIAGNOSIS — K529 Noninfective gastroenteritis and colitis, unspecified: Secondary | ICD-10-CM | POA: Diagnosis not present

## 2018-05-24 DIAGNOSIS — M797 Fibromyalgia: Secondary | ICD-10-CM | POA: Diagnosis not present

## 2018-05-24 DIAGNOSIS — R531 Weakness: Secondary | ICD-10-CM | POA: Diagnosis not present

## 2018-05-24 DIAGNOSIS — F329 Major depressive disorder, single episode, unspecified: Secondary | ICD-10-CM | POA: Diagnosis present

## 2018-05-24 DIAGNOSIS — E119 Type 2 diabetes mellitus without complications: Secondary | ICD-10-CM | POA: Diagnosis present

## 2018-05-24 HISTORY — DX: Malignant neoplasm of unspecified kidney, except renal pelvis: C64.9

## 2018-05-24 LAB — CBC WITH DIFFERENTIAL/PLATELET
BASOS PCT: 0 %
Basophils Absolute: 0 10*3/uL (ref 0.0–0.1)
EOS ABS: 0.1 10*3/uL (ref 0.0–0.7)
Eosinophils Relative: 1 %
HCT: 42.3 % (ref 36.0–46.0)
HEMOGLOBIN: 15.4 g/dL — AB (ref 12.0–15.0)
LYMPHS ABS: 2.2 10*3/uL (ref 0.7–4.0)
Lymphocytes Relative: 32 %
MCH: 32.6 pg (ref 26.0–34.0)
MCHC: 36.4 g/dL — ABNORMAL HIGH (ref 30.0–36.0)
MCV: 89.4 fL (ref 78.0–100.0)
Monocytes Absolute: 0.6 10*3/uL (ref 0.1–1.0)
Monocytes Relative: 9 %
NEUTROS PCT: 58 %
Neutro Abs: 4.1 10*3/uL (ref 1.7–7.7)
Platelets: 235 10*3/uL (ref 150–400)
RBC: 4.73 MIL/uL (ref 3.87–5.11)
RDW: 12.8 % (ref 11.5–15.5)
WBC: 7.1 10*3/uL (ref 4.0–10.5)

## 2018-05-24 LAB — URINALYSIS, ROUTINE W REFLEX MICROSCOPIC
Bilirubin Urine: NEGATIVE
Glucose, UA: NEGATIVE mg/dL
Hgb urine dipstick: NEGATIVE
KETONES UR: NEGATIVE mg/dL
NITRITE: NEGATIVE
PH: 8 (ref 5.0–8.0)
PROTEIN: NEGATIVE mg/dL
SPECIFIC GRAVITY, URINE: 1.008 (ref 1.005–1.030)

## 2018-05-24 LAB — COMPREHENSIVE METABOLIC PANEL
ALT: 50 U/L — AB (ref 0–44)
ANION GAP: 11 (ref 5–15)
AST: 36 U/L (ref 15–41)
Albumin: 4.1 g/dL (ref 3.5–5.0)
Alkaline Phosphatase: 65 U/L (ref 38–126)
BUN: 14 mg/dL (ref 6–20)
CHLORIDE: 94 mmol/L — AB (ref 98–111)
CO2: 32 mmol/L (ref 22–32)
CREATININE: 0.86 mg/dL (ref 0.44–1.00)
Calcium: 9.3 mg/dL (ref 8.9–10.3)
GFR calc non Af Amer: >60 mL/min (ref >60)
Glucose, Bld: 116 mg/dL — ABNORMAL HIGH (ref 70–99)
Potassium: <2 mmol/L — CL (ref 3.5–5.1)
Sodium: 137 mmol/L (ref 135–145)
Total Bilirubin: 0.6 mg/dL (ref 0.3–1.2)
Total Protein: 7.9 g/dL (ref 6.5–8.1)

## 2018-05-24 LAB — MAGNESIUM: Magnesium: 2.3 mg/dL (ref 1.7–2.4)

## 2018-05-24 LAB — IGG, IGA, IGM
IgA: 625 mg/dL — ABNORMAL HIGH (ref 87–352)
IgG (Immunoglobin G), Serum: 1166 mg/dL (ref 700–1600)
IgM (Immunoglobulin M), Srm: 90 mg/dL (ref 26–217)

## 2018-05-24 MED ORDER — CLONAZEPAM 0.5 MG PO TABS
1.0000 mg | ORAL_TABLET | Freq: Three times a day (TID) | ORAL | Status: DC
  Administered 2018-05-24 – 2018-05-29 (×14): 1 mg via ORAL
  Filled 2018-05-24 (×14): qty 2

## 2018-05-24 MED ORDER — TOPIRAMATE 25 MG PO TABS
50.0000 mg | ORAL_TABLET | Freq: Every day | ORAL | Status: DC
Start: 2018-05-24 — End: 2018-05-29
  Administered 2018-05-24 – 2018-05-28 (×5): 50 mg via ORAL
  Filled 2018-05-24 (×5): qty 2

## 2018-05-24 MED ORDER — ENSURE ENLIVE PO LIQD
237.0000 mL | Freq: Two times a day (BID) | ORAL | Status: DC
  Administered 2018-05-25 (×2): 237 mL via ORAL

## 2018-05-24 MED ORDER — POTASSIUM CHLORIDE IN NACL 40-0.9 MEQ/L-% IV SOLN
INTRAVENOUS | Status: DC
  Administered 2018-05-25 – 2018-05-26 (×5): 100 mL/h via INTRAVENOUS
  Administered 2018-05-27 – 2018-05-28 (×2): 50 mL/h via INTRAVENOUS
  Filled 2018-05-24 (×5): qty 1000

## 2018-05-24 MED ORDER — PRAVASTATIN SODIUM 10 MG PO TABS
10.0000 mg | ORAL_TABLET | Freq: Every day | ORAL | Status: DC
  Administered 2018-05-25 – 2018-05-28 (×4): 10 mg via ORAL
  Filled 2018-05-24 (×4): qty 1

## 2018-05-24 MED ORDER — ACETAMINOPHEN 650 MG RE SUPP: 650.0000 mg | Freq: Four times a day (QID) | RECTAL | Status: DC | PRN

## 2018-05-24 MED ORDER — POTASSIUM CHLORIDE 10 MEQ/100ML IV SOLN
10.0000 meq | Freq: Once | INTRAVENOUS | Status: AC
  Administered 2018-05-24: 10 meq via INTRAVENOUS
  Filled 2018-05-24: qty 100

## 2018-05-24 MED ORDER — TOPIRAMATE 25 MG PO TABS: 25.0000 mg | ORAL_TABLET | ORAL | Status: DC

## 2018-05-24 MED ORDER — POTASSIUM CHLORIDE 10 MEQ/100ML IV SOLN
10.0000 meq | INTRAVENOUS | Status: AC
  Administered 2018-05-24 (×3): 10 meq via INTRAVENOUS
  Filled 2018-05-24 (×3): qty 100

## 2018-05-24 MED ORDER — SODIUM CHLORIDE 0.9 % IV SOLN: INTRAVENOUS | Status: DC | PRN

## 2018-05-24 MED ORDER — ONDANSETRON HCL 4 MG PO TABS: 4.0000 mg | ORAL_TABLET | Freq: Once | ORAL | Status: DC

## 2018-05-24 MED ORDER — LORAZEPAM 2 MG/ML IJ SOLN
0.5000 mg | Freq: Once | INTRAMUSCULAR | Status: AC
  Administered 2018-05-24: 0.5 mg via INTRAVENOUS
  Filled 2018-05-24: qty 1

## 2018-05-24 MED ORDER — ACETAMINOPHEN 325 MG PO TABS: 650.0000 mg | ORAL_TABLET | Freq: Four times a day (QID) | ORAL | Status: DC | PRN

## 2018-05-24 MED ORDER — ENOXAPARIN SODIUM 40 MG/0.4ML ~~LOC~~ SOLN
40.0000 mg | SUBCUTANEOUS | Status: DC
  Administered 2018-05-24 – 2018-05-27 (×4): 40 mg via SUBCUTANEOUS
  Filled 2018-05-24 (×4): qty 0.4

## 2018-05-24 MED ORDER — PANTOPRAZOLE SODIUM 40 MG PO TBEC: 40.0000 mg | DELAYED_RELEASE_TABLET | Freq: Every day | ORAL | Status: DC

## 2018-05-24 MED ORDER — FLUOXETINE HCL 20 MG PO CAPS
60.0000 mg | ORAL_CAPSULE | Freq: Every day | ORAL | Status: DC
  Administered 2018-05-25 – 2018-05-29 (×5): 60 mg via ORAL
  Filled 2018-05-24 (×5): qty 3

## 2018-05-24 MED ORDER — PROMETHAZINE HCL 12.5 MG PO TABS: 12.5000 mg | ORAL_TABLET | Freq: Four times a day (QID) | ORAL | Status: DC | PRN

## 2018-05-24 MED ORDER — POTASSIUM CHLORIDE CRYS ER 20 MEQ PO TBCR
40.0000 meq | EXTENDED_RELEASE_TABLET | ORAL | Status: DC
  Administered 2018-05-24 – 2018-05-25 (×3): 40 meq via ORAL
  Filled 2018-05-24 (×4): qty 2

## 2018-05-24 MED ORDER — SODIUM CHLORIDE 0.9 % IV SOLN
INTRAVENOUS | Status: DC
  Administered 2018-05-24: 14:00:00 via INTRAVENOUS

## 2018-05-24 MED ORDER — SODIUM CHLORIDE 0.9 % IV SOLN
INTRAVENOUS | Status: DC
  Filled 2018-05-24 (×5): qty 1000

## 2018-05-24 MED ORDER — TOPIRAMATE 25 MG PO TABS
25.0000 mg | ORAL_TABLET | Freq: Every day | ORAL | Status: DC
  Administered 2018-05-25 – 2018-05-29 (×5): 25 mg via ORAL
  Filled 2018-05-24 (×5): qty 1

## 2018-05-24 MED ORDER — POTASSIUM CHLORIDE CRYS ER 20 MEQ PO TBCR
40.0000 meq | EXTENDED_RELEASE_TABLET | Freq: Once | ORAL | Status: AC
  Administered 2018-05-24: 40 meq via ORAL
  Filled 2018-05-24: qty 2

## 2018-05-24 NOTE — ED Triage Notes (Signed)
Patient seen here at [facility] yesterday and had labs drawn. Patient's potassium was 2, patient unable to come and get potassium infusion. Patient c/o confusion, body aches, abd pain, and shortness of breath.  Per patient told to take 57mEq last night.

## 2018-05-24 NOTE — Plan of Care (Signed)
Continue to follow regimen

## 2018-05-24 NOTE — H&P (Signed)
History and Physical    Sarah Cooley DGL:[id number] DOB: 02-Sep-1966 DOA: 05/24/2018  PCP: Celene Squibb, MD  Patient coming from: Home  I have personally briefly reviewed patient's old medical records in [place]  Chief Complaint: Generalized weakness  HPI: Sarah Cooley is a 52 y.o. female with medical history significant of hypertension, fibromyalgia, breast cancer, possible renal cell carcinoma, irritable bowel syndrome, MGUS, presents to the hospital with complaints of generalized weakness and myalgias/arthralgias.  Patient reports that for several months, she has been feeling very tired, weak, aching in her joints and her muscles.  This is progressively gotten worse over the past few days.  She has chronic bouts of diarrhea and constipation, consistent with her IBS.  She last had 2 loose bowel movements yesterday, but has not had any further bowel movement since then.  She has felt nauseous lately but has not had any vomiting.  She has not had any shortness of breath, cough, fever.  No dysuria.  She had gone to her hematologist yesterday where labs were drawn and she was found to have potassium less than 2.  She was referred to the ER for evaluation.  ED Course: Labs confirm a serum potassium of less than 2.  Magnesium is normal.  Urinalysis is unremarkable.  CBC is unrevealing otherwise.  EKG shows sinus bradycardia with prolonged QT.  She is been referred for admission.  Review of Systems: As per HPI otherwise 10 point review of systems negative.    Past Medical History:  Diagnosis Date  . Allergy    seasonal  . Anxiety   . Arthritis   . Balance problem   . Brain aneurysm    2 mm ACA region aneurysm by 09/21/15 MRA  . Breast cancer ([place])   . Breast cancer of lower-inner quadrant of right female breast ([place]) 08/26/2015  . Chronic kidney disease    acute renal failure one occasion  . Complication of anesthesia    heart rate drops and O2 sats drop  . Constipation   .  Depression   . Diabetes mellitus without complication ([place])    not on medication at this time  . Diplopia 10/29/2017  . Dizziness 10/29/2017  . Elevated liver function tests   . Family history of adverse reaction to anesthesia    mom has n/v  . Family history of breast cancer   . Family history of colon cancer   . Fibromyalgia   . Fibromyalgia   . Gait abnormality 10/29/2017  . Genetic testing 09/08/2015   Negative genetic testing on the Breast/High Moderate Risk panel. The Breast High/Moderate Risk gene panel offered by [facility] includes sequencing and deletion/duplication analysis of the following 9 genes: ATM, BRCA1, BRCA2, CDH1, CHEK2, PALB2, PTEN, STK11, and TP53. The report date is September 08, 2015.  The Rest of the Comprehensive Cancer panel has been reflexed and will be available in 2-3 weeks.  POLD1 c.208G>T VUS found on the Remainder of the Comprehensive cancer panel.  The Comprehensive Cancer Panel offered by [facility] includes sequencing and/or deletion duplication testing of the following 32 genes: APC, ATM, AXIN2, BARD1, BMPR1A, BRCA1, BRCA2, BRIP1, CDH1, CDK4, CDKN2A, CHEK2, EPCAM, FANCC, MLH1, MSH2, MSH6, MUTYH, NBN, PALB2, PMS2, POLD1, POLE, PTEN, RAD51C, RAD51D, SCG5/GREM1, SMAD4, STK11, TP53, VHL, and XRCC2.   The report date is 09/12/2015.   Marland Kitchen GERD (gastroesophageal reflux disease)    none recent  . H/O acute renal failure 09/22/15   admitted to [facility]  .  Headache    occipital and temporal  . History of hiatal hernia   . Hyperlipemia   . Hyperlipidemia   . Hypertension   . Irritable bowel syndrome (IBS)   . MGUS (monoclonal gammopathy of unknown significance) 03/15/2016  . Neuromuscular disorder (HCC)    fibromyalgia  . Occasional tremors   . OSA (obstructive sleep apnea)    uses cpap with humidification  . Personal history of radiation therapy   . PONV (postoperative nausea and vomiting)   . Renal cell carcinoma ([place])   . Tremor, essential 10/29/2017    Past  Surgical History:  Procedure Laterality Date  . ABDOMINAL HYSTERECTOMY     adenomyosis  . ABDOMINAL HYSTERECTOMY    . BLADDER SURGERY    . BONE GRAFT HIP ILIAC CREST    . BREAST BIOPSY Bilateral   . BREAST LUMPECTOMY    . BREAST SURGERY    . CHOLECYSTECTOMY    . FRACTURE SURGERY Right    wrist  . KNEE ARTHROSCOPY    . KNEE ARTHROSCOPY W/ ACL RECONSTRUCTION     right  . RADIOACTIVE SEED GUIDED PARTIAL MASTECTOMY WITH AXILLARY SENTINEL LYMPH NODE BIOPSY Right 10/13/2015   Procedure: RADIOACTIVE SEED GUIDED PARTIAL MASTECTOMY WITH AXILLARY SENTINEL LYMPH NODE BIOPSY;  Surgeon: Erroll Luna, MD;  Location: [facility];  Service: General;  Laterality: Right;  . WRIST SURGERY       reports that she has never smoked. She has never used smokeless tobacco. She reports that she does not drink alcohol or use drugs.  Allergies  Allergen Reactions  . Ramipril Nausea And Vomiting and Other (See Comments)    Ramipril--Caused Pancreatitis  . Shrimp [Shellfish Allergy] Anaphylaxis and Swelling  . Minocycline Hcl Hives  . Altace [Ramipril] Nausea And Vomiting and Nausea Only  . Lyrica [Pregabalin] Nausea And Vomiting  . Adhesive [Tape] Rash    Please use paper tape  . Bactrim [Sulfamethoxazole-Trimethoprim] Other (See Comments)    May have caused kidney issues  . Drixoral [Brompheniramine-Pseudoeph] Rash  . Erythromycin Other (See Comments)    GI- Upset / severe abdominal pain  . Minocin [Minocycline Hcl] Nausea And Vomiting and Rash  . Septra [Sulfamethoxazole-Trimethoprim] Rash  . Sinutab [Chlorphen-Pseudoephed-Apap] Rash    Family History  Problem Relation Age of Onset  . Hypertension Mother   . Autoimmune disease Mother        lichen planus  . Eczema Mother   . Parkinson's disease Mother   . Mental illness Mother        bipolar  . Heart disease Maternal Aunt   . Lung cancer Paternal Aunt   . Breast cancer Paternal Aunt        dx in her 67s  . Lung cancer Paternal Grandfather     . Colon cancer Paternal Uncle        dx in her 45s  . Colon cancer Paternal Uncle        dx in his 69s  . Melanoma Father 57  . Psoriasis Father   . Hypertension Father   . Heart disease Father   . Breast cancer Sister        dx in her 60s  . Lupus Sister   . COPD Maternal Uncle   . Colon cancer Paternal Uncle        dx in his 59s  . Mental illness Son        bipolar     Prior to Admission medications   Medication Sig  Start Date End Date Taking? Authorizing Provider  carvedilol (COREG) 12.5 MG tablet Take 12.5 mg by mouth 2 (two) times daily.   Yes [provider]  chlorthalidone (HYGROTON) 25 MG tablet Take 25 mg by mouth daily.   Yes [provider]  clonazePAM (KLONOPIN) 1 MG tablet Take 1 mg by mouth 3 (three) times daily.    Yes [provider]  diltiazem (CARDIZEM) 120 MG tablet Take 120 mg by mouth daily.   Yes [provider]  FLUoxetine HCl 60 MG TABS Take 60 mg by mouth daily.    Yes [provider]  OVER THE COUNTER MEDICATION Take 1 capsule by mouth daily as needed. Diphenhydramate Over The Counter Anti-emetic   Yes [provider]  pantoprazole (PROTONIX) 40 MG tablet Take 40 mg by mouth daily before breakfast.   Yes [provider]  potassium chloride SA (K-DUR,KLOR-CON) 20 MEQ tablet Take 20 mEq by mouth daily.   Yes [provider]  pravastatin (PRAVACHOL) 10 MG tablet Take 10 mg by mouth daily.   Yes [provider]  topiramate (TOPAMAX) 25 MG tablet One tablet at night for one week, then take one tablet twice a day for 1 week, then take one in the morning and 2 in the evening Patient taking differently: Take 25 mg by mouth See admin instructions. Take one in the morning and 2 in the evening 04/09/18  Yes Kathrynn Ducking, MD  traMADol (ULTRAM) 50 MG tablet Take 1 tablet by mouth daily as needed. 04/21/18  Yes [provider]  Vitamin D, Ergocalciferol, (DRISDOL) 50000 units  CAPS capsule Take 50,000 Units by mouth every 7 (seven) days. Take 1 tablet (50,000 units) by mouth every Thursday   Yes [provider]    Physical Exam: Vitals:   05/24/18 1600 05/24/18 1630 05/24/18 1700 05/24/18 1737  BP: 119/73 114/70 111/68 108/72  Pulse: (!) 58 (!) 51 (!) 58 (!) 54  Resp: 16 16 16 18   Temp:   98 F (36.7 C) 98.8 F (37.1 C)  TempSrc:    Oral  SpO2: 96% 92% 90% 94%  Weight:    103.2 kg (227 lb 8.2 oz)  Height:    5' 8"  (1.727 m)    Constitutional: NAD, calm, comfortable Vitals:   05/24/18 1600 05/24/18 1630 05/24/18 1700 05/24/18 1737  BP: 119/73 114/70 111/68 108/72  Pulse: (!) 58 (!) 51 (!) 58 (!) 54  Resp: 16 16 16 18   Temp:   98 F (36.7 C) 98.8 F (37.1 C)  TempSrc:    Oral  SpO2: 96% 92% 90% 94%  Weight:    103.2 kg (227 lb 8.2 oz)  Height:    5' 8"  (1.727 m)   Eyes: PERRL, lids and conjunctivae normal ENMT: Mucous membranes are moist. Posterior pharynx clear of any exudate or lesions.Normal dentition.  Neck: normal, supple, no masses, no thyromegaly Respiratory: clear to auscultation bilaterally, no wheezing, no crackles. Normal respiratory effort. No accessory muscle use.  Cardiovascular: Regular rate and rhythm, no murmurs / rubs / gallops. No extremity edema. 2+ pedal pulses. No carotid bruits.  Abdomen: no tenderness, no masses palpated. No hepatosplenomegaly. Bowel sounds positive.  Musculoskeletal: no clubbing / cyanosis. No joint deformity upper and lower extremities. Good ROM, no contractures. Normal muscle tone.  Skin: no rashes, lesions, ulcers. No induration Neurologic: CN 2-12 grossly intact. Sensation intact, DTR normal. Strength 5/5 in all 4.  Psychiatric: Normal judgment and insight. Alert and oriented x  3. Normal mood.    Labs on Admission: I have personally reviewed following labs and imaging studies  CBC: Recent Labs  Lab 05/23/18 1059 05/24/18 1313  WBC 7.0 7.1  NEUTROABS 4.0 4.1  HGB 15.3* 15.4*  HCT 41.9  42.3  MCV 89.0 89.4  PLT 233 876   Basic Metabolic Panel: Recent Labs  Lab 05/23/18 1059 05/24/18 1313  NA 136 137  K 2.0* <2.0*  CL 93* 94*  CO2 31 32  GLUCOSE 152* 116*  BUN 12 14  CREATININE 0.92 0.86  CALCIUM 9.3 9.3  MG  --  2.3   GFR: Estimated Creatinine Clearance: 96.2 mL/min (by C-G formula based on SCr of 0.86 mg/dL). Liver Function Tests: Recent Labs  Lab 05/23/18 1059 05/24/18 1313  AST 38 36  ALT 51* 50*  ALKPHOS 69 65  BILITOT 0.7 0.6  PROT 8.1 7.9  ALBUMIN 4.0 4.1   No results for input(s): LIPASE, AMYLASE in the last 168 hours. No results for input(s): AMMONIA in the last 168 hours. Coagulation Profile: No results for input(s): INR, PROTIME in the last 168 hours. Cardiac Enzymes: No results for input(s): CKTOTAL, CKMB, CKMBINDEX, TROPONINI in the last 168 hours. BNP (last 3 results) No results for input(s): PROBNP in the last 8760 hours. HbA1C: No results for input(s): HGBA1C in the last 72 hours. CBG: No results for input(s): GLUCAP in the last 168 hours. Lipid Profile: No results for input(s): CHOL, HDL, LDLCALC, TRIG, CHOLHDL, LDLDIRECT in the last 72 hours. Thyroid Function Tests: No results for input(s): TSH, T4TOTAL, FREET4, T3FREE, THYROIDAB in the last 72 hours. Anemia Panel: Recent Labs    05/23/18 1100  FERRITIN 135   Urine analysis:    Component Value Date/Time   COLORURINE YELLOW 05/24/2018 [place] 05/24/2018 1431   LABSPEC 1.008 05/24/2018 1431   PHURINE 8.0 05/24/2018 1431   GLUCOSEU NEGATIVE 05/24/2018 1431   HGBUR NEGATIVE 05/24/2018 1431   BILIRUBINUR NEGATIVE 05/24/2018 1431   KETONESUR NEGATIVE 05/24/2018 1431   PROTEINUR NEGATIVE 05/24/2018 1431   NITRITE NEGATIVE 05/24/2018 1431   LEUKOCYTESUR MODERATE (A) 05/24/2018 1431    Radiological Exams on Admission: No results found.  EKG: Independently reviewed.  Sinus bradycardia with prolonged QT  Assessment/Plan Active Problems:    Hypertension   MGUS (monoclonal gammopathy of unknown significance)   Fibromyalgia   GERD (gastroesophageal reflux disease)   Anxiety and depression   Obstructive sleep apnea on CPAP   Hypokalemia     1. Hypokalemia.  Possibly related to thiazide use with concurrent GI losses and diarrhea.  Will discontinue further thiazide.  Magnesium is normal.  Will aggressively replace potassium.  Continue to monitor on telemetry. 2. Hypertension.  She is chronically on diltiazem and Coreg.  Currently she is bradycardic.  We will hold these for now.  Blood pressure is stable. 3. Anxiety.  Continue on Klonopin 4. Obstructive sleep apnea.  Continue on CPAP 5. Fibromyalgia.  Patient follows up with a rheumatologist. 6. MGUS.  Patient is followed by hematology/oncology. 7. Right kidney lesion.  She reports that she will undergo partial nephrectomy at the end of this month. 8. Generalized weakness.  Although this is likely multifactorial with her multiple medical problems, certainly her hypokalemia could be contributing.  We will have to reassess once potassium is been replaced. 9. GERD.  Continue PPI  DVT prophylaxis: Lovenox Code Status: Full code Family Communication: No family present Disposition Plan: Discharge home once improved Consults called:  Admission status: Observation, telemetry  Kathie Dike MD [facility] Pager [phone]  If 7PM-7AM, please contact night-coverage [email or web address] Password [facility]  05/24/2018, 7:51 PM

## 2018-05-24 NOTE — ED Provider Notes (Signed)
[facility] EMERGENCY DEPARTMENT Provider Note   CSN: [id number] Arrival date & time: 05/24/18  1215     History   Chief Complaint Chief Complaint  Patient presents with  . Abnormal Lab    HPI Sarah Cooley is a 52 y.o. female.  Patient is a 52 year old female who presents to the emergency department with a complaint of weaknesses, and abnormal labs.  The patient has a history of fibromyalgia, renal cell carcinoma, anxiety, brain aneurysm, status post breast cancer, and diabetes mellitus.  The patient states that over the last week to 10 days she has been feeling more weak than usual.  She has been having more muscle related problems, more joint related problems, more complications with concentrating, and difficulty carrying out her activities of daily living.  She says that she gets very tired easily.  Frequently when she finishes her work she has to come home and lay down.  The patient recently been diagnosed with renal cell carcinoma.  She has not started on treatments yet.  About 2 days ago the patient had lab work done at the cancer center.  And she was found to have a low potassium of 2.  There was a delay in her receiving the message, and so on last evening she was told to take 60 mEq of potassium orally and to come to the emergency department for evaluation today.  The patient denies palpitations or chest pain.  The patient denies any excessive vomiting or recent diarrhea.  The patient does have irritable bowel syndrome, and she says that she has frequent bouts with diarrhea.  She last had diarrhea on Friday, August 2, but only had 2 episodes.  The patient presents now for additional evaluation of her symptoms, and also for evaluation of the low potassium.  The history is provided by the patient.  Abnormal Lab    Past Medical History:  Diagnosis Date  . Allergy    seasonal  . Anxiety   . Arthritis   . Balance problem   . Brain aneurysm    2 mm ACA region aneurysm by  09/21/15 MRA  . Breast cancer ([place])   . Breast cancer of lower-inner quadrant of right female breast ([place]) 08/26/2015  . Chronic kidney disease    acute renal failure one occasion  . Complication of anesthesia    heart rate drops and O2 sats drop  . Constipation   . Depression   . Diabetes mellitus without complication ([place])    not on medication at this time  . Diplopia 10/29/2017  . Dizziness 10/29/2017  . Elevated liver function tests   . Family history of adverse reaction to anesthesia    mom has n/v  . Family history of breast cancer   . Family history of colon cancer   . Fibromyalgia   . Fibromyalgia   . Gait abnormality 10/29/2017  . Genetic testing 09/08/2015   Negative genetic testing on the Breast/High Moderate Risk panel. The Breast High/Moderate Risk gene panel offered by [facility] includes sequencing and deletion/duplication analysis of the following 9 genes: ATM, BRCA1, BRCA2, CDH1, CHEK2, PALB2, PTEN, STK11, and TP53. The report date is September 08, 2015.  The Rest of the Comprehensive Cancer panel has been reflexed and will be available in 2-3 weeks.  POLD1 c.208G>T VUS found on the Remainder of the Comprehensive cancer panel.  The Comprehensive Cancer Panel offered by [facility] includes sequencing and/or deletion duplication testing of the following 32 genes: APC, ATM, AXIN2, BARD1,  BMPR1A, BRCA1, BRCA2, BRIP1, CDH1, CDK4, CDKN2A, CHEK2, EPCAM, FANCC, MLH1, MSH2, MSH6, MUTYH, NBN, PALB2, PMS2, POLD1, POLE, PTEN, RAD51C, RAD51D, SCG5/GREM1, SMAD4, STK11, TP53, VHL, and XRCC2.   The report date is 09/12/2015.   Marland Kitchen GERD (gastroesophageal reflux disease)    none recent  . H/O acute renal failure 09/22/15   admitted to [facility]  . Headache    occipital and temporal  . History of hiatal hernia   . Hyperlipemia   . Hyperlipidemia   . Hypertension   . Irritable bowel syndrome (IBS)   . MGUS (monoclonal gammopathy of unknown significance) 03/15/2016  . Neuromuscular disorder (HCC)     fibromyalgia  . Occasional tremors   . OSA (obstructive sleep apnea)    uses cpap with humidification  . Personal history of radiation therapy   . PONV (postoperative nausea and vomiting)   . Renal cell carcinoma ([place])   . Tremor, essential 10/29/2017    Patient Active Problem List   Diagnosis Date Noted  . Dizziness 10/29/2017  . Gait abnormality 10/29/2017  . Tremor, essential 10/29/2017  . Diplopia 10/29/2017  . Obstructive sleep apnea on CPAP 12/10/2016  . Plantar fasciitis, bilateral 12/10/2016  . Fibromyalgia 11/09/2016  . IBS (irritable bowel syndrome) 11/09/2016  . GERD (gastroesophageal reflux disease) 11/09/2016  . Anxiety and depression 11/09/2016  . MGUS (monoclonal gammopathy of unknown significance) 03/15/2016  . Breast cancer of lower-inner quadrant of right female breast ([place]) 08/26/2015  . Headache 07/17/2015  . Hypertension 07/17/2015  . Obesity (BMI 35.0-39.9 without comorbidity) 07/17/2015    Past Surgical History:  Procedure Laterality Date  . ABDOMINAL HYSTERECTOMY     adenomyosis  . ABDOMINAL HYSTERECTOMY    . BLADDER SURGERY    . BONE GRAFT HIP ILIAC CREST    . BREAST BIOPSY Bilateral   . BREAST LUMPECTOMY    . BREAST SURGERY    . CHOLECYSTECTOMY    . FRACTURE SURGERY Right    wrist  . KNEE ARTHROSCOPY    . KNEE ARTHROSCOPY W/ ACL RECONSTRUCTION     right  . RADIOACTIVE SEED GUIDED PARTIAL MASTECTOMY WITH AXILLARY SENTINEL LYMPH NODE BIOPSY Right 10/13/2015   Procedure: RADIOACTIVE SEED GUIDED PARTIAL MASTECTOMY WITH AXILLARY SENTINEL LYMPH NODE BIOPSY;  Surgeon: Erroll Luna, MD;  Location: [facility];  Service: General;  Laterality: Right;  . WRIST SURGERY       OB History   None      Home Medications    Prior to Admission medications   Medication Sig Start Date End Date Taking? Authorizing Provider  carvedilol (COREG) 12.5 MG tablet Take 12.5 mg by mouth 2 (two) times daily.    [provider]  chlorthalidone (HYGROTON) 25  MG tablet Take 25 mg by mouth daily.    [provider]  clonazePAM (KLONOPIN) 1 MG tablet Take 1 mg by mouth daily.    [provider]  diltiazem (CARDIZEM) 120 MG tablet Take 120 mg by mouth daily.    [provider]  FLUoxetine (PROZAC) 20 MG capsule Take 20 mg by mouth daily.    [provider]  FLUoxetine (PROZAC) 40 MG capsule Take 40 mg by mouth daily.    [provider]  pantoprazole (PROTONIX) 40 MG tablet Take 40 mg by mouth daily before breakfast.    [provider]  potassium chloride SA (K-DUR,KLOR-CON) 20 MEQ tablet Take 20 mEq by mouth daily.    [provider]  pravastatin (PRAVACHOL) 10 MG tablet Take  10 mg by mouth daily.    [provider]  topiramate (TOPAMAX) 25 MG tablet One tablet at night for one week, then take one tablet twice a day for 1 week, then take one in the morning and 2 in the evening 04/09/18   Kathrynn Ducking, MD  Vitamin D, Ergocalciferol, (DRISDOL) 50000 units CAPS capsule Take 50,000 Units by mouth every 7 (seven) days. Take 1 tablet (50,000 units) by mouth every Thursday    [provider]    Family History Family History  Problem Relation Age of Onset  . Hypertension Mother   . Autoimmune disease Mother        lichen planus  . Eczema Mother   . Parkinson's disease Mother   . Mental illness Mother        bipolar  . Heart disease Maternal Aunt   . Lung cancer Paternal Aunt   . Breast cancer Paternal Aunt        dx in her 62s  . Lung cancer Paternal Grandfather   . Colon cancer Paternal Uncle        dx in her 20s  . Colon cancer Paternal Uncle        dx in his 5s  . Melanoma Father 30  . Psoriasis Father   . Hypertension Father   . Heart disease Father   . Breast cancer Sister        dx in her 82s  . Lupus Sister   . COPD Maternal Uncle   . Colon cancer Paternal Uncle        dx in his 31s  . Mental illness Son        bipolar    Social History Social  History   Tobacco Use  . Smoking status: Never Smoker  . Smokeless tobacco: Never Used  Substance Use Topics  . Alcohol use: Never    Frequency: Never    Comment: maybe one a year  . Drug use: Never     Allergies   Ramipril; Shrimp [shellfish allergy]; Minocycline hcl; Altace [ramipril]; Lyrica [pregabalin]; Adhesive [tape]; Bactrim [sulfamethoxazole-trimethoprim]; Drixoral [brompheniramine-pseudoeph]; Erythromycin; Lyrica [pregabalin]; Minocin [minocycline hcl]; Septra [sulfamethoxazole-trimethoprim]; Shrimp [shellfish allergy]; and Sinutab [chlorphen-pseudoephed-apap]   Review of Systems Review of Systems  Constitutional: Positive for activity change, appetite change and fatigue.       All ROS Neg except as noted in HPI  HENT: Negative for nosebleeds.   Eyes: Negative for photophobia and discharge.  Respiratory: Negative for cough, shortness of breath and wheezing.   Cardiovascular: Negative for chest pain and palpitations.  Gastrointestinal: Negative for abdominal pain and blood in stool.  Genitourinary: Negative for dysuria, frequency and hematuria.  Musculoskeletal: Positive for arthralgias and myalgias. Negative for back pain and neck pain.  Skin: Negative.   Neurological: Positive for tremors. Negative for dizziness, seizures and speech difficulty.  Psychiatric/Behavioral: Positive for confusion. Negative for hallucinations.     Physical Exam Updated Vital Signs BP 111/60   Pulse (!) 55   Temp 98.9 F (37.2 C) (Oral)   Resp 16   Ht 5' 8.25" (1.734 m)   Wt 99.3 kg (219 lb)   SpO2 97%   BMI 33.06 kg/m   Physical Exam  Constitutional: She is oriented to person, place, and time. She appears well-developed and well-nourished.  Non-toxic appearance.  HENT:  Head: Normocephalic.  Right Ear: Tympanic membrane and external ear normal.  Left Ear: Tympanic membrane and external ear normal.  Eyes: Pupils are equal,  round, and reactive to light. EOM and lids are normal.   Neck: Normal range of motion. Neck supple. Carotid bruit is not present.  Cardiovascular: Regular rhythm, normal heart sounds, intact distal pulses and normal pulses. Bradycardia present.  Pulmonary/Chest: Breath sounds normal. No respiratory distress.  There is symmetrical rise and fall of the chest.  The patient speaks in complete sentences without problem.  Abdominal: Soft. Bowel sounds are normal. There is no tenderness. There is no guarding.  Musculoskeletal: Normal range of motion.  Patient complains of soreness and stiffness of multiple joints, no hot joints appreciated.  Lymphadenopathy:       Head (right side): No submandibular adenopathy present.       Head (left side): No submandibular adenopathy present.    She has no cervical adenopathy.  Neurological: She is alert and oriented to person, place, and time. She has normal strength. No cranial nerve deficit or sensory deficit.  Slight head tremor noted at times.  No gross neurologic deficits appreciated.  No motor or sensory deficits noted. The patient is amatory without problem.  Skin: Skin is warm and dry.  Psychiatric: She has a normal mood and affect. Her speech is normal.  Nursing note and vitals reviewed.    ED Treatments / Results  Labs (all labs ordered are listed, but only abnormal results are displayed) Labs Reviewed  COMPREHENSIVE METABOLIC PANEL  CBC WITH DIFFERENTIAL/PLATELET  URINALYSIS, ROUTINE W REFLEX MICROSCOPIC  MAGNESIUM    EKG None  Radiology No results found.  Procedures Procedures (including critical care time) CRITICAL CARE Performed by: Lily Kocher Total critical care time: **35* minutes Critical care time was exclusive of separately billable procedures and treating other patients. Critical care was necessary to treat or prevent imminent or life-threatening deterioration. Critical care was time spent personally by me on the following activities: development of treatment plan with  patient and/or surrogate as well as nursing, discussions with consultants, evaluation of patient's response to treatment, examination of patient, obtaining history from patient or surrogate, ordering and performing treatments and interventions, ordering and review of laboratory studies, ordering and review of radiographic studies, pulse oximetry and re-evaluation of patient's condition. Medications Ordered in ED Medications - No data to display   Initial Impression / Assessment and Plan / ED Course  I have reviewed the triage vital signs and the nursing notes.  Pertinent labs & imaging results that were available during my care of the patient were reviewed by me and considered in my medical decision making (see chart for details).       Final Clinical Impressions(s) / ED Diagnoses MDM  Vital signs reviewed.  Patient has a bradycardia of 53.  Patient complaining of multiple complaints including body aches, joint pain, muscle cramping, weakness, and tremors at times.  Patient was told the results of outpatient labs from yesterday that she had a potassium of 2.  The patient took 60 mEq of potassium orally on last evening.  Today the comprehensive metabolic panel shows the potassium to be less than 2.  The remainder of the conference of metabolic panel is nonacute.  The anion gap is normal at 11.  The complete blood count is nonacute.  The electrocardiogram shows a bradycardia present with some prolonged QT.  The magnesium is normal at 2.3  Patient seen with me by Dr. Reather Converse.  I discussed the findings with the patient in terms of which she understands.  IV potassium and oral potassium have been started.  The patient will  be discussed with the hospitalist for admission.  Cardiac monitor shows heart rate of 54 to 48. No high degree block. No change on recheck evaluation. Case discussed with [facility] hospitalist.  They will admit the patient to the hospital.   Final diagnoses:  Hypokalemia    Prolonged Q-T interval on ECG  Weakness    ED Discharge Orders    None       Lily Kocher, PA-C 05/24/18 1502    Elnora Morrison, MD 05/24/18 [id number]

## 2018-05-24 NOTE — ED Notes (Signed)
Date and time results received: 05/24/18 1343  Test: Potassium  Critical Value: < 2.0  Name of Provider Notified: Lily Kocher, PA  Orders Received? Or Actions Taken?: See chart

## 2018-05-25 DIAGNOSIS — F419 Anxiety disorder, unspecified: Secondary | ICD-10-CM | POA: Diagnosis not present

## 2018-05-25 DIAGNOSIS — D472 Monoclonal gammopathy: Secondary | ICD-10-CM | POA: Diagnosis present

## 2018-05-25 DIAGNOSIS — E785 Hyperlipidemia, unspecified: Secondary | ICD-10-CM | POA: Diagnosis present

## 2018-05-25 DIAGNOSIS — Z853 Personal history of malignant neoplasm of breast: Secondary | ICD-10-CM | POA: Diagnosis not present

## 2018-05-25 DIAGNOSIS — F329 Major depressive disorder, single episode, unspecified: Secondary | ICD-10-CM | POA: Diagnosis present

## 2018-05-25 DIAGNOSIS — K529 Noninfective gastroenteritis and colitis, unspecified: Secondary | ICD-10-CM

## 2018-05-25 DIAGNOSIS — M797 Fibromyalgia: Secondary | ICD-10-CM

## 2018-05-25 DIAGNOSIS — R531 Weakness: Secondary | ICD-10-CM | POA: Diagnosis present

## 2018-05-25 DIAGNOSIS — R9431 Abnormal electrocardiogram [ECG] [EKG]: Secondary | ICD-10-CM | POA: Diagnosis not present

## 2018-05-25 DIAGNOSIS — I10 Essential (primary) hypertension: Secondary | ICD-10-CM

## 2018-05-25 DIAGNOSIS — E876 Hypokalemia: Secondary | ICD-10-CM | POA: Diagnosis present

## 2018-05-25 DIAGNOSIS — G4733 Obstructive sleep apnea (adult) (pediatric): Secondary | ICD-10-CM

## 2018-05-25 DIAGNOSIS — A0472 Enterocolitis due to Clostridium difficile, not specified as recurrent: Secondary | ICD-10-CM | POA: Diagnosis not present

## 2018-05-25 DIAGNOSIS — Z923 Personal history of irradiation: Secondary | ICD-10-CM | POA: Diagnosis not present

## 2018-05-25 DIAGNOSIS — K219 Gastro-esophageal reflux disease without esophagitis: Secondary | ICD-10-CM | POA: Diagnosis present

## 2018-05-25 DIAGNOSIS — C649 Malignant neoplasm of unspecified kidney, except renal pelvis: Secondary | ICD-10-CM | POA: Diagnosis present

## 2018-05-25 DIAGNOSIS — K580 Irritable bowel syndrome with diarrhea: Secondary | ICD-10-CM | POA: Diagnosis present

## 2018-05-25 DIAGNOSIS — F411 Generalized anxiety disorder: Secondary | ICD-10-CM | POA: Diagnosis present

## 2018-05-25 DIAGNOSIS — E119 Type 2 diabetes mellitus without complications: Secondary | ICD-10-CM | POA: Diagnosis present

## 2018-05-25 DIAGNOSIS — Z882 Allergy status to sulfonamides status: Secondary | ICD-10-CM | POA: Diagnosis not present

## 2018-05-25 DIAGNOSIS — R001 Bradycardia, unspecified: Secondary | ICD-10-CM | POA: Diagnosis present

## 2018-05-25 DIAGNOSIS — Z888 Allergy status to other drugs, medicaments and biological substances status: Secondary | ICD-10-CM | POA: Diagnosis not present

## 2018-05-25 DIAGNOSIS — T502X5A Adverse effect of carbonic-anhydrase inhibitors, benzothiadiazides and other diuretics, initial encounter: Secondary | ICD-10-CM | POA: Diagnosis present

## 2018-05-25 DIAGNOSIS — I4581 Long QT syndrome: Secondary | ICD-10-CM | POA: Diagnosis present

## 2018-05-25 DIAGNOSIS — Z8679 Personal history of other diseases of the circulatory system: Secondary | ICD-10-CM | POA: Diagnosis not present

## 2018-05-25 DIAGNOSIS — R251 Tremor, unspecified: Secondary | ICD-10-CM | POA: Diagnosis present

## 2018-05-25 DIAGNOSIS — Z91013 Allergy to seafood: Secondary | ICD-10-CM | POA: Diagnosis not present

## 2018-05-25 DIAGNOSIS — Z9989 Dependence on other enabling machines and devices: Secondary | ICD-10-CM

## 2018-05-25 LAB — HEMOGLOBIN A1C
HEMOGLOBIN A1C: 5.9 % — AB (ref 4.8–5.6)
MEAN PLASMA GLUCOSE: 122.63 mg/dL

## 2018-05-25 LAB — BASIC METABOLIC PANEL
Anion gap: 7 (ref 5–15)
BUN: 11 mg/dL (ref 6–20)
CALCIUM: 8.7 mg/dL — AB (ref 8.9–10.3)
CO2: 28 mmol/L (ref 22–32)
CREATININE: 0.81 mg/dL (ref 0.44–1.00)
Chloride: 105 mmol/L (ref 98–111)
Glucose, Bld: 121 mg/dL — ABNORMAL HIGH (ref 70–99)
Potassium: 2.9 mmol/L — ABNORMAL LOW (ref 3.5–5.1)
Sodium: 140 mmol/L (ref 135–145)

## 2018-05-25 LAB — MAGNESIUM: MAGNESIUM: 2.2 mg/dL (ref 1.7–2.4)

## 2018-05-25 LAB — TSH: TSH: 0.946 u[IU]/mL (ref 0.350–4.500)

## 2018-05-25 MED ORDER — POTASSIUM CHLORIDE 10 MEQ/100ML IV SOLN
10.0000 meq | INTRAVENOUS | Status: AC
  Administered 2018-05-25 (×6): 10 meq via INTRAVENOUS
  Filled 2018-05-25 (×6): qty 100

## 2018-05-25 MED ORDER — CALCIUM CARBONATE ANTACID 500 MG PO CHEW
1.0000 | CHEWABLE_TABLET | Freq: Three times a day (TID) | ORAL | Status: DC
  Administered 2018-05-25 – 2018-05-26 (×3): 200 mg via ORAL
  Filled 2018-05-25 (×3): qty 1

## 2018-05-25 NOTE — Progress Notes (Signed)
PROGRESS NOTE  Sarah Cooley  TML:[id number]  DOB: 1966/08/11  DOA: 05/24/2018 PCP: Celene Squibb, MD   Brief Admission Hx: Sarah Cooley is a 52 y.o. female with medical history significant of hypertension, fibromyalgia, breast cancer, possible renal cell carcinoma, irritable bowel syndrome, MGUS, presents to the hospital with complaints of generalized weakness and myalgias/arthralgias.  She was admitted with severe hypokalemia, thought secondary to ongoing chronic diarrhea from IBS and concurrent use of thiazide diuretics.   MDM/Assessment & Plan:   1. Severe hypokalemia - slowly improving with aggressive replacement.  Continue to follow closely.   Continue to follow magnesium levels.  IV replacement ordered this morning.  2. Severe diarrhea - Pt has had 5-6 loose stools this morning.  Her IBS is uncontrolled.  She is refusing to try lomotil due to "causing severe constipation", will ask for GI consult for recommendations.   3. IBS - diarrhea predominant - uncontrolled at this point, pt does not want to take imodium/lomotil, asking for GI eval for recommendations.  4. Essential hypertension - she is quite bradycardic and therefore we have been holding her home coreg/diltiazem. BP is soft.   5. GAD - resumed her home clonazepam.  6. OSA - continue nightly CPAP.  7. Fibromyalgia chronic fatigue syndrome  - chronic symptoms and she is followed by a rheumatologist for this.  8. Generalized weakness / chronic fatigue - Will ask PT to evaluate tomorrow as electrolytes are repleted.  9. GERD - continue PPI therapy.    DVT prophylaxis: lovenox  Code Status: full  Family Communication: husband at bedside Disposition Plan: Home when medically stabilized   Consultants:  GI    Subjective: Pt says that she continues to feel very fatigued and weak and having 5-6 loose stools since 2am today.    Objective: Vitals:   05/24/18 1700 05/24/18 1737 05/24/18 2314 05/25/18 0557  BP: 111/68  108/72 101/67 101/72  Pulse: (!) 58 (!) 54 (!) 55 (!) 56  Resp: 16 18 14 15   Temp: 98 F (36.7 C) 98.8 F (37.1 C) 98.4 F (36.9 C) 97.6 F (36.4 C)  TempSrc:  Oral Oral Oral  SpO2: 90% 94% 95% 98%  Weight:  103.2 kg (227 lb 8.2 oz)    Height:  5\' 8"  (1.727 m)      Intake/Output Summary (Last 24 hours) at 05/25/2018 1014 Last data filed at 05/25/2018 0406 Gross per 24 hour  Intake 1930 ml  Output -  Net 1930 ml   Filed Weights   05/24/18 1231 05/24/18 1737  Weight: 99.3 kg (219 lb) 103.2 kg (227 lb 8.2 oz)     REVIEW OF SYSTEMS  As per history otherwise all reviewed and reported negative  Exam:  General exam: awake, alert, NAD, cooperative.  Respiratory system: Clear. No increased work of breathing. Cardiovascular system: S1 & S2 heard, RRR. No JVD, murmurs, gallops, clicks or pedal edema. Gastrointestinal system: Abdomen is nondistended, soft and nontender. Normal bowel sounds heard. Central nervous system: Alert and oriented. No focal neurological deficits. Extremities: no CCE.  Data Reviewed: Basic Metabolic Panel: Recent Labs  Lab 05/23/18 1059 05/24/18 1313 05/25/18 0700  NA 136 137 140  K 2.0* <2.0* 2.9*  CL 93* 94* 105  CO2 31 32 28  GLUCOSE 152* 116* 121*  BUN 12 14 11   CREATININE 0.92 0.86 0.81  CALCIUM 9.3 9.3 8.7*  MG  --  2.3  --    Liver Function Tests: Recent Labs  Lab 05/23/18  1059 05/24/18 1313  AST 38 36  ALT 51* 50*  ALKPHOS 69 65  BILITOT 0.7 0.6  PROT 8.1 7.9  ALBUMIN 4.0 4.1   No results for input(s): LIPASE, AMYLASE in the last 168 hours. No results for input(s): AMMONIA in the last 168 hours. CBC: Recent Labs  Lab 05/23/18 1059 05/24/18 1313  WBC 7.0 7.1  NEUTROABS 4.0 4.1  HGB 15.3* 15.4*  HCT 41.9 42.3  MCV 89.0 89.4  PLT 233 235   Cardiac Enzymes: No results for input(s): CKTOTAL, CKMB, CKMBINDEX, TROPONINI in the last 168 hours. CBG (last 3)  No results for input(s): GLUCAP in the last 72 hours. No results  found for this or any previous visit (from the past 240 hour(s)).   Studies: No results found.  Scheduled Meds: . clonazePAM  1 mg Oral TID  . enoxaparin (LOVENOX) injection  40 mg Subcutaneous Q24H  . feeding supplement (ENSURE ENLIVE)  237 mL Oral BID BM  . FLUoxetine  60 mg Oral Daily  . pravastatin  10 mg Oral q1800  . topiramate  25 mg Oral Daily   And  . topiramate  50 mg Oral QHS   Continuous Infusions: . sodium chloride    . 0.9 % NaCl with KCl 40 mEq / L 100 mL/hr (05/25/18 0406)  . potassium chloride      Active Problems:   Hypertension   MGUS (monoclonal gammopathy of unknown significance)   Fibromyalgia   GERD (gastroesophageal reflux disease)   Anxiety and depression   Obstructive sleep apnea on CPAP   Hypokalemia  Time spent:   Irwin Brakeman, MD, FAAFP [facility] Pager [phone]  If 7PM-7AM, please contact night-coverage [email or web address] Password [email or web address] 05/25/2018, 10:14 AM    LOS: 0 days

## 2018-05-25 NOTE — Consult Note (Addendum)
Referring Provider: No ref. provider found Primary Care Physician:  Celene Squibb, MD Primary Gastroenterologist:  DR. KARKI, [facility]  Reason for Consultation:  DIARRHEA; CHANGE IN BOWEL HABITS   Impression: ADMITTED WITH WEAKNESS AND CHRONIC INTERMITTENT DIARRHEA WHILE TAKING HCTZ. DIFFERENTIAL DIAGNOSIS INCLUDES: MICROSCOPIC COLITIS, LESS LIKELY DIABETIC COLOPATHY/ENTEROPATHY, GIARDIASIS, C DIFF COLITIS, OR IBD.  DIFFERENTIAL DIAGNOSIS FOR NAUSEA INCLUDES: ELECTROLYTE DISTURBANCE, GASTROPARESIS, LESS LIKELY ADRENAL INSUFFICIENCY OR GERD.  Plan: 1. LOW FAT/LACTOSE FREE/LOW FIBER DIET. 2. SEND STOOL FOR C DIFF PCR/GIARDIA Ag 3. FOLLOW UP WITH DR.KARKI. CONSIDER TCS FOR RANDOM BIOPSIES IF NOT ALREADY OBTAINED. 4. HOLD HCTZ INDEFINITELY. 5. CHECK HGA1C. 6. REPLACE KCL. 7. ADD ZOFRAN ONCE K CORRECTED AND QT INTERVAL NL. PT LIKELY NEEDS GES OR SMART PILL TO EVALUATE NAUSEA BUT THIS CAN BE DONE AS AN OUTPT.      HPI:  PT HAS HAD IBS WITH INTERMITTENT CONSTIPATION AND DIARRHEA FOR MANY YEARS. CURRENTLY HAVING FLARES THAT LAST 2-3 WEEKS. HAS SEEN PRIMARY GI DOC: DR. KARKI AND HAD RECENT EGD WITH BIOPSIES. NO CELAIC DISEASE OR H PYLORI DETECTED. OVER THE PAST WEEK SHE CAN HAVE AS MANY AS 8 BMs IN A DAY.FOR THE PAST WEEK SHE HAS WATERY STOOLS. TOOK AN IMODIUM. HAD NO BM FOR 3-4 DAYS THEN "CONSTIPATION" THEN  A NL STOOL. WED/THUR SHE HAD NO BM. FRI: 2 SML STOOLS. SAT NO STOOLS. STARTED HAVING WATERY STOOLS AGAIN @ 2 AM THIS AM. SHE HAS 8 SINCE THEN.  SHE CAME TO ED BECAUSE SHE FELT WEAK. SHE HAS A Rx AT HOME FOR COLESTID, BUT HASN'T STARTED IT. HAVING DIARRHEA AND LOWER CRAMPY ABDOMINAL PAIN DURING FIRST PART OF JUL AND WAS GIVEN ABX FOR PRESUMED DIVERTICULITIS BUT THE CT JUL 10 SHOWED NO ACUTE DISEASE.   ALSO HAVING TROUBLE WITH NAUSEA. RARELY VOMITS. LAST VOMITED MOS AGO. REPORTS SHE LOSE 30 LBS IN THE PAST FEW MOS. REGULARLY AVOID CERTAIN FOODS BECAUSE THEY MAKE HER HAVE DIARRHEA.(CORN, SEEDS,  STRAWBERRIES). SHE IS CONSUMING A COFFEE WITH WHIPPED CREAM. DOESN'T DRINK MILK REALLY; LOVES CHEESE. EATS IT < 3-4 TIMES A WEEK. EATS MORE AT HOME THAN EATS OUT. NO TRAVEL OR WELL WATER.   PT DENIES FEVER, CHILLS, HEMATOCHEZIA, HEMATEMESIS, vomiting, melena, OR heartburn or indigestion.    Past Medical History:  Diagnosis Date  . Allergy    seasonal  . Anxiety   . Arthritis   . Balance problem   . Brain aneurysm    2 mm ACA region aneurysm by 09/21/15 M[facility]  . Breast cancer ([place])   . Breast cancer of lower-inner quadrant of right female breast ([place]) 08/26/2015  . Chronic kidney disease    acute renal failure one occasion  . Complication of anesthesia    heart rate drops and O2 sats drop  . Constipation   . Depression   . Diabetes mellitus without complication ([place])    not on medication at this time  . Diplopia 10/29/2017  . Dizziness 10/29/2017  . Elevated liver function tests   . Family history of adverse reaction to anesthesia    mom has n/v  . Family history of breast cancer   . Family history of colon cancer   . Fibromyalgia   . Fibromyalgia   . Gait abnormality 10/29/2017  . Genetic testing 09/08/2015   Negative genetic testing on the Breast/High Moderate Risk panel. The Breast High/Moderate Risk gene panel offered by [facility] includes sequencing and deletion/duplication analysis of the following 9 genes: ATM, BRCA1, BRCA2, CDH1, CHEK2, PALB2,  PTEN, STK11, and TP53. The report date is September 08, 2015.  The Rest of the Comprehensive Cancer panel has been reflexed and will be available in 2-3 weeks.  POLD1 c.208G>T VUS found on the Remainder of the Comprehensive cancer panel.  The Comprehensive Cancer Panel offered by [facility] includes sequencing and/or deletion duplication testing of the following 32 genes: APC, ATM, AXIN2, BARD1, BMPR1A, BRCA1, BRCA2, BRIP1, CDH1, CDK4, CDKN2A, CHEK2, EPCAM, FANCC, MLH1, MSH2, MSH6, MUTYH, NBN, PALB2, PMS2, POLD1, POLE, PTEN, RAD51C, RAD51D,  SCG5/GREM1, SMAD4, STK11, TP53, VHL, and XRCC2.   The report date is 09/12/2015.   Marland Kitchen GERD (gastroesophageal reflux disease)    none recent  . H/O acute renal failure 09/22/15   admitted to [facility]  . Headache    occipital and temporal  . History of hiatal hernia   . Hyperlipemia   . Hyperlipidemia   . Hypertension   . Irritable bowel syndrome (IBS)   . MGUS (monoclonal gammopathy of unknown significance) 03/15/2016  . Neuromuscular disorder (HCC)    fibromyalgia  . Occasional tremors   . OSA (obstructive sleep apnea)    uses cpap with humidification  . Personal history of radiation therapy   . PONV (postoperative nausea and vomiting)   . Renal cell carcinoma ([place])   . Tremor, essential 10/29/2017    Past Surgical History:  Procedure Laterality Date  . ABDOMINAL HYSTERECTOMY     adenomyosis  . ABDOMINAL HYSTERECTOMY    . BLADDER SURGERY    . BONE GRAFT HIP ILIAC CREST    . BREAST BIOPSY Bilateral   . BREAST LUMPECTOMY    . BREAST SURGERY    . CHOLECYSTECTOMY    . FRACTURE SURGERY Right    wrist  . KNEE ARTHROSCOPY    . KNEE ARTHROSCOPY W/ ACL RECONSTRUCTION     right  . RADIOACTIVE SEED GUIDED PARTIAL MASTECTOMY WITH AXILLARY SENTINEL LYMPH NODE BIOPSY Right 10/13/2015   Procedure: RADIOACTIVE SEED GUIDED PARTIAL MASTECTOMY WITH AXILLARY SENTINEL LYMPH NODE BIOPSY;  Surgeon: Erroll Luna, MD;  Location: [facility];  Service: General;  Laterality: Right;  . WRIST SURGERY      Prior to Admission medications   Medication Sig Start Date End Date Taking? Authorizing Provider  carvedilol (COREG) 12.5 MG tablet Take 12.5 mg by mouth 2 (two) times daily.   Yes [provider]  chlorthalidone (HYGROTON) 25 MG tablet Take 25 mg by mouth daily.   Yes [provider]  clonazePAM (KLONOPIN) 1 MG tablet Take 1 mg by mouth 3 (three) times daily.    Yes [provider]  diltiazem (CARDIZEM) 120 MG tablet Take 120 mg by mouth daily.   Yes [provider]  FLUoxetine HCl 60 MG TABS Take 60 mg by mouth daily.    Yes [provider]  OVER THE COUNTER MEDICATION Take 1 capsule by mouth daily as needed. Diphenhydramate Over The Counter Anti-emetic   Yes [provider]  pantoprazole (PROTONIX) 40 MG tablet Take 40 mg by mouth daily before breakfast.   Yes [provider]  potassium chloride SA (K-DUR,KLOR-CON) 20 MEQ tablet Take 20 mEq by mouth daily.   Yes [provider]  pravastatin (PRAVACHOL) 10 MG tablet Take 10 mg by mouth daily.   Yes [provider]  topiramate (TOPAMAX) 25 MG tablet One tablet at night for one week, then take one tablet twice a day for 1 week, then take one in the morning and 2 in the evening Patient  taking differently: Take 25 mg by mouth See admin instructions. Take one in the morning and 2 in the evening 04/09/18  Yes Kathrynn Ducking, MD  traMADol (ULTRAM) 50 MG tablet Take 1 tablet by mouth daily as needed. 04/21/18  Yes [provider]  Vitamin D, Ergocalciferol, (DRISDOL) 50000 units CAPS capsule Take 50,000 Units by mouth every 7 (seven) days. Take 1 tablet (50,000 units) by mouth every Thursday   Yes [provider]    Current Facility-Administered Medications  Medication Dose Route Frequency Provider Last Rate Last Dose  . 0.9 %  sodium chloride infusion   Intravenous PRN Kathie Dike, MD      . 0.9 % NaCl with KCl 40 mEq / L  infusion   Intravenous Continuous Kathie Dike, MD 100 mL/hr at 05/25/18 0406 100 mL/hr at 05/25/18 0406  . acetaminophen (TYLENOL) tablet 650 mg  650 mg Oral Q6H PRN Kathie Dike, MD       Or  . acetaminophen (TYLENOL) suppository 650 mg  650 mg Rectal Q6H PRN Kathie Dike, MD      . clonazePAM (KLONOPIN) tablet 1 mg  1 mg Oral TID Kathie Dike, MD   1 mg at 05/25/18 0932  . enoxaparin (LOVENOX) injection 40 mg  40 mg Subcutaneous Q24H Kathie Dike, MD   40 mg at 05/24/18 2205  . feeding supplement  (ENSURE ENLIVE) (ENSURE ENLIVE) liquid 237 mL  237 mL Oral BID BM Kathie Dike, MD   237 mL at 05/25/18 0932  . FLUoxetine (PROZAC) capsule 60 mg  60 mg Oral Daily Kathie Dike, MD   60 mg at 05/25/18 0933  . potassium chloride 10 mEq in 100 mL IVPB  10 mEq Intravenous Q1 Hr x 6 Johnson, Clanford L, MD 100 mL/hr at 05/25/18 1332 10 mEq at 05/25/18 1332  . pravastatin (PRAVACHOL) tablet 10 mg  10 mg Oral q1800 Kathie Dike, MD      . topiramate (TOPAMAX) tablet 25 mg  25 mg Oral Daily Kathie Dike, MD   25 mg at 05/25/18 0934   And  . topiramate (TOPAMAX) tablet 50 mg  50 mg Oral QHS Kathie Dike, MD   50 mg at 05/24/18 2204    Allergies as of 05/24/2018 - Review Complete 05/24/2018  Allergen Reaction Noted  . Ramipril Nausea And Vomiting and Other (See Comments) 02/12/2012  . Shrimp [shellfish allergy] Anaphylaxis and Swelling 02/12/2012  . Minocycline hcl Hives 02/12/2012  . Altace [ramipril] Nausea And Vomiting and Nausea Only 01/18/2018  . Lyrica [pregabalin] Nausea And Vomiting 01/18/2018  . Adhesive [tape] Rash 10/11/2015  . Bactrim [sulfamethoxazole-trimethoprim] Other (See Comments) 10/11/2015  . Drixoral [brompheniramine-pseudoeph] Rash 02/12/2012  . Erythromycin Other (See Comments) 02/12/2012  . Minocin [minocycline hcl] Nausea And Vomiting and Rash 01/18/2018  . Septra [sulfamethoxazole-trimethoprim] Rash 01/18/2018  . Sinutab [chlorphen-pseudoephed-apap] Rash 02/12/2012    Family History  Problem Relation Age of Onset  . Hypertension Mother   . Autoimmune disease Mother        lichen planus  . Eczema Mother   . Parkinson's disease Mother   . Mental illness Mother        bipolar  . Heart disease Maternal Aunt   . Lung cancer Paternal Aunt   . Breast cancer Paternal Aunt        dx in her 61s  . Lung cancer Paternal Grandfather   . Colon cancer Paternal Uncle        dx in her 40s  .  Colon cancer Paternal Uncle        dx in his 38s  . Melanoma Father  92  . Psoriasis Father   . Hypertension Father   . Heart disease Father   . Breast cancer Sister        dx in her 58s  . Lupus Sister   . COPD Maternal Uncle   . Colon cancer Paternal Uncle        dx in his 33s  . Mental illness Son        bipolar     Social History   Socioeconomic History  . Marital status: Married    Spouse name: Psychologist, clinical  . Number of children: 2  . Years of education: 53  . Highest education level: Not on file  Occupational History  . Occupation: physician office  Social Needs  . Financial resource strain: Not on file  . Food insecurity:    Worry: Not on file    Inability: Not on file  . Transportation needs:    Medical: Not on file    Non-medical: Not on file  Tobacco Use  . Smoking status: Never Smoker  . Smokeless tobacco: Never Used  Substance and Sexual Activity  . Alcohol use: Never    Frequency: Never    Comment: maybe one a year  . Drug use: Never  . Sexual activity: Yes    Birth control/protection: Surgical  Lifestyle  . Physical activity:    Days per week: Not on file    Minutes per session: Not on file  . Stress: Not on file  Relationships  . Social connections:    Talks on phone: Not on file    Gets together: Not on file    Attends religious service: Not on file    Active member of club or organization: Not on file    Attends meetings of clubs or organizations: Not on file    Relationship status: Not on file  . Intimate partner violence:    Fear of current or ex partner: Not on file    Emotionally abused: Not on file    Physically abused: Not on file    Forced sexual activity: Not on file  Other Topics Concern  . Not on file  Social History Narrative   ** Merged History Encounter **       Lives with husband Husband on disability Children grown and moved worries about family illness- mother and sister Caffeine use:      Review of Systems: PER HPI OTHERWISE ALL SYSTEMS ARE NEGATIVE.   Vitals: Blood pressure  103/67, pulse (!) 57, temperature 98.4 F (36.9 C), temperature source Oral, resp. rate 18, height 5' 8"  (1.727 m), weight 227 lb 8.2 oz (103.2 kg), SpO2 97 %.  Physical Exam: General:   Alert,  Well-developed, well-nourished, pleasant and cooperative in NAD Head:  Normocephalic and atraumatic. Eyes:  Sclera clear, no icterus.   Conjunctiva pink. Mouth:  No lesions, dentition Abnormal. Neck:  Supple; no masses. Lungs:  Clear throughout to auscultation.   No wheezes. No acute distress. Heart:  Regular rate and rhythm; no murmurs. Abdomen:  Soft, MILDLY tender IN EPIGASTRIUM, nondistended. No masses noted. Normal bowel sounds, without guarding, and without rebound.   Msk:  Symmetrical without gross deformities. Normal posture. Extremities:  Without edema. Neurologic:  Alert and  oriented x4;  grossly normal neurologically. NO  NEW FOCAL DEFICITS Cervical Nodes:  No significant cervical adenopathy. Psych:  Alert and cooperative. SLIGHTLY  ANXIOUS MOOD, FLAT affect.   Lab Results: Recent Labs    05/23/18 1059 05/24/18 1313  WBC 7.0 7.1  HGB 15.3* 15.4*  HCT 41.9 42.3  PLT 233 235   BMET Recent Labs    05/24/18 1313 05/25/18 0700  NA 137 140  K <2.0* 2.9*  CL 94* 105  CO2 32 28  GLUCOSE 116* 121*  BUN 14 11  CREATININE 0.86 0.81  CALCIUM 9.3 8.7*   LFT Recent Labs    05/24/18 1313  PROT 7.9  ALBUMIN 4.1  AST 36  ALT 50*  ALKPHOS 65  BILITOT 0.6     Studies/Results: CT JUL 10: NO ACUTE INTRAABDOMINAL PROCESS   LOS: 0 days   Kanna Dafoe  05/25/2018, 2:52 PM

## 2018-05-26 ENCOUNTER — Ambulatory Visit (HOSPITAL_COMMUNITY): Payer: BLUE CROSS/BLUE SHIELD

## 2018-05-26 ENCOUNTER — Ambulatory Visit (HOSPITAL_COMMUNITY): Payer: BLUE CROSS/BLUE SHIELD | Admitting: Internal Medicine

## 2018-05-26 DIAGNOSIS — D472 Monoclonal gammopathy: Secondary | ICD-10-CM

## 2018-05-26 DIAGNOSIS — R531 Weakness: Secondary | ICD-10-CM

## 2018-05-26 DIAGNOSIS — E876 Hypokalemia: Principal | ICD-10-CM

## 2018-05-26 DIAGNOSIS — R9431 Abnormal electrocardiogram [ECG] [EKG]: Secondary | ICD-10-CM

## 2018-05-26 LAB — COMPREHENSIVE METABOLIC PANEL
ALT: 36 U/L (ref 0–44)
AST: 26 U/L (ref 15–41)
Albumin: 3.3 g/dL — ABNORMAL LOW (ref 3.5–5.0)
Alkaline Phosphatase: 51 U/L (ref 38–126)
Anion gap: 8 (ref 5–15)
BUN: 10 mg/dL (ref 6–20)
CALCIUM: 8.7 mg/dL — AB (ref 8.9–10.3)
CHLORIDE: 107 mmol/L (ref 98–111)
CO2: 26 mmol/L (ref 22–32)
Creatinine, Ser: 0.76 mg/dL (ref 0.44–1.00)
GFR calc Af Amer: >60 mL/min (ref >60)
GFR calc non Af Amer: >60 mL/min (ref >60)
Glucose, Bld: 111 mg/dL — ABNORMAL HIGH (ref 70–99)
Potassium: 2.9 mmol/L — ABNORMAL LOW (ref 3.5–5.1)
SODIUM: 141 mmol/L (ref 135–145)
TOTAL PROTEIN: 6.5 g/dL (ref 6.5–8.1)
Total Bilirubin: 0.5 mg/dL (ref 0.3–1.2)

## 2018-05-26 LAB — CBC
HCT: 41.2 % (ref 36.0–46.0)
HEMOGLOBIN: 14 g/dL (ref 12.0–15.0)
MCH: 31.7 pg (ref 26.0–34.0)
MCHC: 34 g/dL (ref 30.0–36.0)
MCV: 93.2 fL (ref 78.0–100.0)
PLATELETS: 163 10*3/uL (ref 150–400)
RBC: 4.42 MIL/uL (ref 3.87–5.11)
RDW: 13.5 % (ref 11.5–15.5)
WBC: 7.2 10*3/uL (ref 4.0–10.5)

## 2018-05-26 LAB — PROTEIN ELECTROPHORESIS, SERUM
A/G RATIO SPE: 1 (ref 0.7–1.7)
ALBUMIN ELP: 3.9 g/dL (ref 2.9–4.4)
ALPHA-1-GLOBULIN: 0.3 g/dL (ref 0.0–0.4)
ALPHA-2-GLOBULIN: 0.8 g/dL (ref 0.4–1.0)
Beta Globulin: 1.6 g/dL — ABNORMAL HIGH (ref 0.7–1.3)
GLOBULIN, TOTAL: 3.8 g/dL (ref 2.2–3.9)
Gamma Globulin: 1.1 g/dL (ref 0.4–1.8)
Total Protein ELP: 7.7 g/dL (ref 6.0–8.5)

## 2018-05-26 LAB — CORTISOL-AM, BLOOD: Cortisol - AM: 18.5 ug/dL (ref 6.7–22.6)

## 2018-05-26 LAB — HEMOGLOBIN A1C
HEMOGLOBIN A1C: 5.9 % — AB (ref 4.8–5.6)
MEAN PLASMA GLUCOSE: 122.63 mg/dL

## 2018-05-26 LAB — C DIFFICILE QUICK SCREEN W PCR REFLEX
C Diff antigen: POSITIVE — AB
C Diff toxin: NEGATIVE

## 2018-05-26 LAB — KAPPA/LAMBDA LIGHT CHAINS
KAPPA, LAMDA LIGHT CHAIN RATIO: 1.77 — AB (ref 0.26–1.65)
Kappa free light chain: 31.3 mg/L — ABNORMAL HIGH (ref 3.3–19.4)
LAMDA FREE LIGHT CHAINS: 17.7 mg/L (ref 5.7–26.3)

## 2018-05-26 LAB — MAGNESIUM: MAGNESIUM: 1.9 mg/dL (ref 1.7–2.4)

## 2018-05-26 LAB — CLOSTRIDIUM DIFFICILE BY PCR, REFLEXED: CDIFFPCR: POSITIVE — AB

## 2018-05-26 MED ORDER — POTASSIUM CHLORIDE 10 MEQ/100ML IV SOLN
10.0000 meq | INTRAVENOUS | Status: AC
  Administered 2018-05-26 (×6): 10 meq via INTRAVENOUS
  Filled 2018-05-26: qty 100

## 2018-05-26 MED ORDER — CALCIUM CARBONATE ANTACID 500 MG PO CHEW
1.0000 | CHEWABLE_TABLET | Freq: Two times a day (BID) | ORAL | Status: DC
  Administered 2018-05-27 – 2018-05-29 (×6): 200 mg via ORAL
  Filled 2018-05-26 (×6): qty 1

## 2018-05-26 NOTE — Progress Notes (Signed)
Initial Nutrition Assessment  DOCUMENTATION CODES:   Obesity unspecified  INTERVENTION:  Food preferences obtained daily   High Protein Snack daily    NUTRITION DIAGNOSIS:  Altered GI function related to nausea, diarrhea(intermittent for the past 3 months ) as evidenced by percent weight loss noted below.   GOAL:  Patient will meet greater than or equal to 90% of their needs  MONITOR:   PO intake, Weight trends, Labs REASON FOR ASSESSMENT:   Malnutrition Screening Tool    ASSESSMENT: Patient presents to ED with complaints of increased weakness. Hypokalemia on admission. She has hx of DM, HTN, HLD, GERD, IBS and constipation. Diarrhea yesterday. Patient says she has been experiencing diarrhea, nausea on and off for the past 3 months. 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    Her home diet- drinks lactacid, and limits seeds, spicy foods and high fat foods. They have been baking and grilling chicken or other lean meat and eating salads. She also has yogurt in the mornings with her medicine and enjoys eating cheese.   Patients appetite reported as fair due to intermittent nausea. Meal intake documented at 100%. GI consulted and has advanced diet to soft-low fat/ lactose free. Patient is tolerating diet with one soft formed stool this morning and 1 loose stool this afternoon. Patient weight is down since April ~ 20 lbs (8%) unplanned weight loss which is significant for timeframe. Unable to complete physical exam at this time.    Labs: BMP Latest Ref Rng & Units 05/27/2018 05/26/2018 05/25/2018  Glucose 70 - 99 mg/dL 113(H) 111(H) 121(H)  BUN 6 - 20 mg/dL 9 10 11   Creatinine 0.44 - 1.00 mg/dL 0.74 0.76 0.81  Sodium 135 - 145 mmol/L 140 141 140  Potassium 3.5 - 5.1 mmol/L 3.1(L) 2.9(L) 2.9(L)  Chloride 98 - 111 mmol/L 110 107 105  CO2 22 - 32 mmol/L 24 26 28   Calcium 8.9 - 10.3 mg/dL 8.5(L) 8.7(L)  8.7(L)     Medications reviewed and include: calcium carbonate BID, klonopin, florastor, vancomycin, potassium chloride    NUTRITION - FOCUSED PHYSICAL EXAM: deferred today   Diet Order:   Diet Order           DIET SOFT Room service appropriate? Yes; Fluid consistency: Thin  Diet effective now          EDUCATION NEEDS:   Education needs have been addressed Skin:  Skin Assessment: Reviewed RN Assessment  Last BM:  8/6 per pt soft well formed stool this morning and loose stool this afternoon  Height:   Ht Readings from Last 1 Encounters:  05/24/18 5\' 8"  (1.727 m)    Weight:   Wt Readings from Last 1 Encounters:  05/24/18 227 lb 8.2 oz (103.2 kg)    Ideal Body Weight:  64 kg  BMI:  Body mass index is 34.59 kg/m.  Estimated Nutritional Needs:   Kcal:  1800-1900   Protein:  95-110 gr  Fluid:  1.8-1.9 liters daily   Colman Cater MS,RD,CSG,LDN Office: [phone] Pager: [phone]

## 2018-05-26 NOTE — Progress Notes (Signed)
PROGRESS NOTE  Sarah Cooley  WJX:[id number]  DOB: Sep 14, 1966  DOA: 05/24/2018 PCP: Celene Squibb, MD   Brief Admission Hx: Sarah Cooley is a 52 y.o. female with medical history significant of hypertension, fibromyalgia, breast cancer, possible renal cell carcinoma, irritable bowel syndrome, MGUS, presents to the hospital with complaints of generalized weakness and myalgias/arthralgias.  She was admitted with severe hypokalemia, thought secondary to ongoing chronic diarrhea from IBS and concurrent use of thiazide diuretics.   MDM/Assessment & Plan:   1. Severe hypokalemia - slowly improving with aggressive replacement.  Continue to follow closely.   Continue to follow magnesium levels.  IV replacement ordered this morning.  2. Severe diarrhea - Pt has had 8 loose stools yesterday.  Her IBS is uncontrolled.  GI consulted and started her on oral tums.   3. IBS - diarrhea predominant - Follow GI recommendations.  4. Essential hypertension - she is quite bradycardic and therefore we have been holding her home coreg/diltiazem. BP is soft.   5. GAD - resumed her home clonazepam.  6. OSA - continue nightly CPAP.  7. Fibromyalgia chronic fatigue syndrome  - chronic symptoms and she is followed by a rheumatologist for this.  8. Generalized weakness / chronic fatigue - Will ask PT to evaluate tomorrow as electrolytes are repleted.  9. GERD - continue PPI therapy.    DVT prophylaxis: lovenox  Code Status: full  Family Communication: husband at bedside Disposition Plan: Home when medically stabilized  Consultants:  GI    Subjective: Pt says that she continues to feel very fatigued and weak.  She had 8 stools yesterday and they it stopped.    Objective: Vitals:   05/25/18 1436 05/25/18 2150 05/26/18 0511 05/26/18 1437  BP: 103/67 116/76 119/80 99/64  Pulse: (!) 57 (!) 55 (!) 56 64  Resp: 18 18 18 18   Temp: 98.4 F (36.9 C) (!) 97.4 F (36.3 C) 97.8 F (36.6 C) 98.4 F (36.9 C)    TempSrc: Oral Oral Oral Oral  SpO2: 97% 98% 97% 95%  Weight:      Height:        Intake/Output Summary (Last 24 hours) at 05/26/2018 1643 Last data filed at 05/26/2018 1533 Gross per 24 hour  Intake 4451.67 ml  Output -  Net 4451.67 ml   Filed Weights   05/24/18 1231 05/24/18 1737  Weight: 99.3 kg (219 lb) 103.2 kg (227 lb 8.2 oz)   REVIEW OF SYSTEMS  As per history otherwise all reviewed and reported negative  Exam:  General exam: awake, alert, NAD, cooperative.  Respiratory system: Clear. No increased work of breathing. Cardiovascular system: S1 & S2 heard, RRR. No JVD, murmurs, gallops, clicks or pedal edema. Gastrointestinal system: Abdomen is nondistended, soft and nontender. Normal bowel sounds heard. Central nervous system: Alert and oriented. No focal neurological deficits. Extremities: no CCE.  Data Reviewed: Basic Metabolic Panel: Recent Labs  Lab 05/23/18 1059 05/24/18 1313 05/25/18 0700 05/26/18 0531  NA 136 137 140 141  K 2.0* <2.0* 2.9* 2.9*  CL 93* 94* 105 107  CO2 31 32 28 26  GLUCOSE 152* 116* 121* 111*  BUN 12 14 11 10   CREATININE 0.92 0.86 0.81 0.76  CALCIUM 9.3 9.3 8.7* 8.7*  MG  --  2.3 2.2 1.9   Liver Function Tests: Recent Labs  Lab 05/23/18 1059 05/24/18 1313 05/26/18 0531  AST 38 36 26  ALT 51* 50* 36  ALKPHOS 69 65 51  BILITOT 0.7 0.6  0.5  PROT 8.1 7.9 6.5  ALBUMIN 4.0 4.1 3.3*   No results for input(s): LIPASE, AMYLASE in the last 168 hours. No results for input(s): AMMONIA in the last 168 hours. CBC: Recent Labs  Lab 05/23/18 1059 05/24/18 1313 05/26/18 0531  WBC 7.0 7.1 7.2  NEUTROABS 4.0 4.1  --   HGB 15.3* 15.4* 14.0  HCT 41.9 42.3 41.2  MCV 89.0 89.4 93.2  PLT 233 235 163   Cardiac Enzymes: No results for input(s): CKTOTAL, CKMB, CKMBINDEX, TROPONINI in the last 168 hours. CBG (last 3)  No results for input(s): GLUCAP in the last 72 hours. Recent Results (from the past 240 hour(s))  C difficile quick scan w  PCR reflex     Status: Abnormal   Collection Time: 05/26/18  9:45 AM  Result Value Ref Range Status   C Diff antigen POSITIVE (A) NEGATIVE Final   C Diff toxin NEGATIVE NEGATIVE Final   C Diff interpretation Results are indeterminate. See PCR results.  Final    Comment: Performed at [facility], [address] [place], [place] [address]     Studies: No results found.  Scheduled Meds: . [START ON 05/27/2018] calcium carbonate  1 tablet Oral BID WC  . clonazePAM  1 mg Oral TID  . enoxaparin (LOVENOX) injection  40 mg Subcutaneous Q24H  . feeding supplement (ENSURE ENLIVE)  237 mL Oral BID BM  . FLUoxetine  60 mg Oral Daily  . pravastatin  10 mg Oral q1800  . topiramate  25 mg Oral Daily   And  . topiramate  50 mg Oral QHS   Continuous Infusions: . sodium chloride    . 0.9 % NaCl with KCl 40 mEq / L 100 mL/hr (05/26/18 0308)    Active Problems:   Hypertension   MGUS (monoclonal gammopathy of unknown significance)   Fibromyalgia   GERD (gastroesophageal reflux disease)   Anxiety and depression   Obstructive sleep apnea on CPAP   Hypokalemia   Hypokalemia, gastrointestinal losses  Time spent:   Irwin Brakeman, MD, FAAFP [facility] Pager [phone]  If 7PM-7AM, please contact night-coverage [email or web address] Password [email or web address] 05/26/2018, 4:43 PM    LOS: 1 day

## 2018-05-26 NOTE — Progress Notes (Signed)
    Subjective: Tolerating diet. Intermittent nausea but no vomiting. 8 loose stools yesterday but unable to collect stool samples as diarrhea had stopped by the time the specimen collection kit was available.   Objective: Vital signs in last 24 hours: Temp:  [97.4 F (36.3 C)-98.4 F (36.9 C)] 97.8 F (36.6 C) (08/05 0511) Pulse Rate:  [55-57] 56 (08/05 0511) Resp:  [18] 18 (08/05 0511) BP: (103-119)/(67-80) 119/80 (08/05 0511) SpO2:  [97 %-98 %] 97 % (08/05 0511) Last BM Date: 05/25/18 General:   Alert and oriented, pleasant Head:  Normocephalic and atraumatic. Abdomen:  Bowel sounds present, soft, non-tender, non-distended. No HSM or hernias noted. No rebound or guarding. No masses appreciated  Msk:  Symmetrical without gross deformities. Normal posture. Extremities:  Without edema. Neurologic:  Alert and  oriented x4 Psych:  Alert and cooperative. Normal mood and affect.  Intake/Output from previous day: 08/04 0701 - 08/05 0700 In: 2220 [P.O.:720; I.V.:1000; IV Piggyback:500] Out: -  Intake/Output this shift: No intake/output data recorded.  Lab Results: Recent Labs    05/23/18 1059 05/24/18 1313 05/26/18 0531  WBC 7.0 7.1 7.2  HGB 15.3* 15.4* 14.0  HCT 41.9 42.3 41.2  PLT 233 235 163   BMET Recent Labs    05/24/18 1313 05/25/18 0700 05/26/18 0531  NA 137 140 141  K <2.0* 2.9* 2.9*  CL 94* 105 107  CO2 32 28 26  GLUCOSE 116* 121* 111*  BUN '14 11 10  '$ CREATININE 0.86 0.81 0.76  CALCIUM 9.3 8.7* 8.7*   LFT Recent Labs    05/23/18 1059 05/24/18 1313 05/26/18 0531  PROT 8.1 7.9 6.5  ALBUMIN 4.0 4.1 3.3*  AST 38 36 26  ALT 51* 50* 36  ALKPHOS 69 65 51  BILITOT 0.7 0.6 0.5    Assessment: 52 year old female admitted with profound hypokalemia and weakness in setting of chronic intermittent diarrhea. Continued with 8 loose stools yesterday, but Cdiff and Giardia not obtained as specimen kit not in room until after diarrhea ceased. Patient aware of  need for collection.   Diarrhea: await stool studies. Continue Tums TID. Recommend colonoscopy as outpatient with segmental biopsies with [facility].  A1c only mildly elevated at 5.9. Continue low fat/lactose free/low fiber diet.   Nausea: intermittent, no vomiting. Tolerating diet. Consider GES as outpatient.   Hypokalemia: potassium remains at 2.9. IV potassium X 6 doses ordered by attending.    Plan: Continue potassium repletion Continue low fat/lactose free, soft diet  Await stool studies Continue with Tums Monitor nausea: Zofran to be added when potassium is corrected and normalized QT interval Consider GES as outpatient Recommend colonoscopy with random colonic biopsies as outpatient with primary GI [facility])   Annitta Needs, PhD, ANP-BC [facility]    LOS: 1 day    05/26/2018, 9:03 AM

## 2018-05-27 ENCOUNTER — Encounter (HOSPITAL_COMMUNITY): Payer: Self-pay | Admitting: *Deleted

## 2018-05-27 DIAGNOSIS — A0472 Enterocolitis due to Clostridium difficile, not specified as recurrent: Secondary | ICD-10-CM

## 2018-05-27 LAB — BASIC METABOLIC PANEL
ANION GAP: 6 (ref 5–15)
BUN: 9 mg/dL (ref 6–20)
CALCIUM: 8.5 mg/dL — AB (ref 8.9–10.3)
CHLORIDE: 110 mmol/L (ref 98–111)
CO2: 24 mmol/L (ref 22–32)
Creatinine, Ser: 0.74 mg/dL (ref 0.44–1.00)
GFR calc Af Amer: >60 mL/min (ref >60)
GFR calc non Af Amer: >60 mL/min (ref >60)
GLUCOSE: 113 mg/dL — AB (ref 70–99)
Potassium: 3.1 mmol/L — ABNORMAL LOW (ref 3.5–5.1)
Sodium: 140 mmol/L (ref 135–145)

## 2018-05-27 LAB — GIARDIA/CRYPTOSPORIDIUM EIA
CRYPTOSPORIDIUM EIA: NEGATIVE
Giardia Ag, Stl: NEGATIVE

## 2018-05-27 LAB — MAGNESIUM: Magnesium: 2 mg/dL (ref 1.7–2.4)

## 2018-05-27 MED ORDER — VANCOMYCIN 50 MG/ML ORAL SOLUTION
125.0000 mg | Freq: Four times a day (QID) | ORAL | Status: DC
  Filled 2018-05-27 (×6): qty 2.5

## 2018-05-27 MED ORDER — VANCOMYCIN HCL 125 MG PO CAPS: 125.0000 mg | ORAL_CAPSULE | Freq: Four times a day (QID) | ORAL | Status: DC

## 2018-05-27 MED ORDER — VANCOMYCIN HCL 125 MG PO CAPS: 125.0000 mg | ORAL_CAPSULE | Freq: Three times a day (TID) | ORAL | Status: DC

## 2018-05-27 MED ORDER — VANCOMYCIN 50 MG/ML ORAL SOLUTION
125.0000 mg | Freq: Three times a day (TID) | ORAL | Status: DC
  Administered 2018-05-27 – 2018-05-29 (×10): 125 mg via ORAL
  Filled 2018-05-27 (×14): qty 2.5

## 2018-05-27 MED ORDER — POTASSIUM CHLORIDE 10 MEQ/100ML IV SOLN
10.0000 meq | INTRAVENOUS | Status: AC
  Administered 2018-05-27 (×6): 10 meq via INTRAVENOUS
  Filled 2018-05-27 (×6): qty 100

## 2018-05-27 MED ORDER — SACCHAROMYCES BOULARDII 250 MG PO CAPS
250.0000 mg | ORAL_CAPSULE | Freq: Two times a day (BID) | ORAL | Status: DC
  Administered 2018-05-27 – 2018-05-29 (×5): 250 mg via ORAL
  Filled 2018-05-27 (×5): qty 1

## 2018-05-27 NOTE — Progress Notes (Signed)
PROGRESS NOTE  Sarah Cooley  XNT:[id number]  DOB: May 17, 1966  DOA: 05/24/2018 PCP: Celene Squibb, MD   Brief Admission Hx: Sarah Cooley is a 52 y.o. female with medical history significant of hypertension, fibromyalgia, breast cancer, possible renal cell carcinoma, irritable bowel syndrome, MGUS, presents to the hospital with complaints of generalized weakness and myalgias/arthralgias.  She was admitted with severe hypokalemia, thought secondary to ongoing chronic diarrhea from IBS and concurrent use of thiazide diuretics.   MDM/Assessment & Plan:   1. Severe hypokalemia - slowly improving with aggressive replacement.  IV KCL ordered today.  Continue to follow closely.   Continue to follow magnesium levels.  IV replacement ordered this morning.  2. Severe diarrhea - Pt tested positive for C diff and started on oral vancomycin by GI team.   3. IBS - diarrhea predominant - Follow GI recommendations.  4. Essential hypertension - she is quite bradycardic and therefore we have been holding her home coreg/diltiazem. BP has been soft.   5. GAD - resumed her home clonazepam.  6. OSA - continue nightly CPAP.  7. Fibromyalgia chronic fatigue syndrome  - chronic symptoms and she is followed by a rheumatologist for this.  8. Generalized weakness / chronic fatigue - Will ask PT to evaluate tomorrow as electrolytes are repleted.  9. GERD - continue PPI therapy.    DVT prophylaxis: lovenox  Code Status: full  Family Communication: husband at bedside Disposition Plan: Home when medically stabilized  Consultants:  GI   Subjective: Pt says that stools have slowed down dramatically.  Still weak but not nearly as severe. No CP.     Objective: Vitals:   05/26/18 0511 05/26/18 1437 05/26/18 2317 05/27/18 0554  BP: 119/80 99/64 117/76 (!) 133/91  Pulse: (!) 56 64 (!) 56 (!) 56  Resp: 18 18 20 18   Temp: 97.8 F (36.6 C) 98.4 F (36.9 C) 98.2 F (36.8 C) 98.4 F (36.9 C)  TempSrc: Oral  Oral Oral Oral  SpO2: 97% 95% 96% 98%  Weight:      Height:        Intake/Output Summary (Last 24 hours) at 05/27/2018 1341 Last data filed at 05/27/2018 0600 Gross per 24 hour  Intake 4156.67 ml  Output -  Net 4156.67 ml   Filed Weights   05/24/18 1231 05/24/18 1737  Weight: 99.3 kg (219 lb) 103.2 kg (227 lb 8.2 oz)   REVIEW OF SYSTEMS  As per history otherwise all reviewed and reported negative  Exam:  General exam: awake, alert, NAD, cooperative.  Respiratory system: Clear. No increased work of breathing. Cardiovascular system: S1 & S2 heard, RRR. No JVD, murmurs, gallops, clicks or pedal edema. Gastrointestinal system: Abdomen is nondistended, soft and nontender. Normal bowel sounds heard. Central nervous system: Alert and oriented. No focal neurological deficits. Extremities: no CCE.  Data Reviewed: Basic Metabolic Panel: Recent Labs  Lab 05/23/18 1059 05/24/18 1313 05/25/18 0700 05/26/18 0531 05/27/18 0813  NA 136 137 140 141 140  K 2.0* <2.0* 2.9* 2.9* 3.1*  CL 93* 94* 105 107 110  CO2 31 32 28 26 24   GLUCOSE 152* 116* 121* 111* 113*  BUN 12 14 11 10 9   CREATININE 0.92 0.86 0.81 0.76 0.74  CALCIUM 9.3 9.3 8.7* 8.7* 8.5*  MG  --  2.3 2.2 1.9 2.0   Liver Function Tests: Recent Labs  Lab 05/23/18 1059 05/24/18 1313 05/26/18 0531  AST 38 36 26  ALT 51* 50* 36  ALKPHOS 69  65 51  BILITOT 0.7 0.6 0.5  PROT 8.1 7.9 6.5  ALBUMIN 4.0 4.1 3.3*   No results for input(s): LIPASE, AMYLASE in the last 168 hours. No results for input(s): AMMONIA in the last 168 hours. CBC: Recent Labs  Lab 05/23/18 1059 05/24/18 1313 05/26/18 0531  WBC 7.0 7.1 7.2  NEUTROABS 4.0 4.1  --   HGB 15.3* 15.4* 14.0  HCT 41.9 42.3 41.2  MCV 89.0 89.4 93.2  PLT 233 235 163   Cardiac Enzymes: No results for input(s): CKTOTAL, CKMB, CKMBINDEX, TROPONINI in the last 168 hours. CBG (last 3)  No results for input(s): GLUCAP in the last 72 hours. Recent Results (from the past 240  hour(s))  C difficile quick scan w PCR reflex     Status: Abnormal   Collection Time: 05/26/18  9:45 AM  Result Value Ref Range Status   C Diff antigen POSITIVE (A) NEGATIVE Final   C Diff toxin NEGATIVE NEGATIVE Final   C Diff interpretation Results are indeterminate. See PCR results.  Final    Comment: Performed at [facility], [address], [place], [place] [address]  C. Diff by PCR, Reflexed     Status: Abnormal   Collection Time: 05/26/18  9:45 AM  Result Value Ref Range Status   Toxigenic C. Difficile by PCR POSITIVE (A) NEGATIVE Final    Comment: Positive for toxigenic C. difficile with little to no toxin production. Only treat if clinical presentation suggests symptomatic illness. Performed at [facility], [place] [address], [place], [place] [address]      Studies: No results found.  Scheduled Meds: . calcium carbonate  1 tablet Oral BID WC  . clonazePAM  1 mg Oral TID  . enoxaparin (LOVENOX) injection  40 mg Subcutaneous Q24H  . feeding supplement (ENSURE ENLIVE)  237 mL Oral BID BM  . FLUoxetine  60 mg Oral Daily  . pravastatin  10 mg Oral q1800  . saccharomyces boulardii  250 mg Oral BID  . topiramate  25 mg Oral Daily   And  . topiramate  50 mg Oral QHS  . vancomycin  125 mg Oral TID WC & HS   Continuous Infusions: . sodium chloride    . 0.9 % NaCl with KCl 40 mEq / L 100 mL/hr (05/26/18 2022)  . potassium chloride Stopped (05/27/18 1304)    Active Problems:   Hypertension   MGUS (monoclonal gammopathy of unknown significance)   Fibromyalgia   GERD (gastroesophageal reflux disease)   Anxiety and depression   Obstructive sleep apnea on CPAP   Hypokalemia   Hypokalemia, gastrointestinal losses   Prolonged Q-T interval on ECG   Weakness   Clostridium difficile diarrhea  Time spent:   Irwin Brakeman, MD, FAAFP [facility] Pager [phone]  If 7PM-7AM, please contact night-coverage [email or web address] Password [email or web address] 05/27/2018, 1:41 PM     LOS: 2 days

## 2018-05-27 NOTE — Evaluation (Signed)
Physical Therapy Evaluation Patient Details Name: Sarah Cooley MRN: [id number] DOB: 07/27/1966 Today's Date: 05/27/2018   History of Present Illness   Sarah Cooley is a 52 y.o. female with medical history significant of hypertension, fibromyalgia, breast cancer, possible renal cell carcinoma, irritable bowel syndrome, MGUS, presents to the hospital with complaints of generalized weakness and myalgias/arthralgias.  Patient reports that for several months, she has been feeling very tired, weak, aching in her joints and her muscles.  This is progressively gotten worse over the past few days.  She has chronic bouts of diarrhea and constipation, consistent with her IBS.  She last had 2 loose bowel movements yesterday, but has not had any further bowel movement since then.  She has felt nauseous lately but has not had any vomiting.  She has not had any shortness of breath, cough, fever.  No dysuria.  She had gone to her hematologist yesterday where labs were drawn and she was found to have potassium less than 2.  She was referred to the ER for evaluation.    Clinical Impression  Patient functioning at baseline for functional mobility and gait.  Plan:  Patient discharged from physical therapy to care of nursing for ambulation daily as tolerated for length of stay.    Follow Up Recommendations No PT follow up    Equipment Recommendations  None recommended by PT    Recommendations for Other Services       Precautions / Restrictions Precautions Precautions: None Restrictions Weight Bearing Restrictions: No      Mobility  Bed Mobility Overal bed mobility: Independent                Transfers Overall transfer level: Modified independent                  Ambulation/Gait Ambulation/Gait assistance: Modified independent (Device/Increase time) Gait Distance (Feet): 200 Feet Assistive device: None Gait Pattern/deviations: WFL(Within Functional Limits) Gait velocity: near  normal   General Gait Details: demonstrates good return for ambulation on level, inclined, and declined surfaces without loss of balance  Stairs            Wheelchair Mobility    Modified Rankin (Stroke Patients Only)       Balance Overall balance assessment: No apparent balance deficits (not formally assessed)                                           Pertinent Vitals/Pain Pain Assessment: No/denies pain    Home Living Family/patient expects to be discharged to:: Private residence Living Arrangements: Spouse/significant other Available Help at Discharge: Family;Available 24 hours/day Type of Home: House Home Access: Stairs to enter Entrance Stairs-Rails: Right;Left;Can reach both Entrance Stairs-Number of Steps: 3 Home Layout: Two level;Full bath on main level;Able to live on main level with bedroom/bathroom Home Equipment: Gilford Rile - 2 wheels;Shower seat;Grab bars - tub/shower(uses walking stick)      Prior Function Level of Independence: Independent with assistive device(s)         Comments: household ambulator without AD, uses walking stick for longer distances     Hand Dominance        Extremity/Trunk Assessment   Upper Extremity Assessment Upper Extremity Assessment: Overall WFL for tasks assessed    Lower Extremity Assessment Lower Extremity Assessment: Overall WFL for tasks assessed    Cervical / Trunk Assessment Cervical / Trunk  Assessment: Normal  Communication   Communication: No difficulties  Cognition Arousal/Alertness: Awake/alert Behavior During Therapy: WFL for tasks assessed/performed Overall Cognitive Status: Within Functional Limits for tasks assessed                                        General Comments      Exercises     Assessment/Plan    PT Assessment Patent does not need any further PT services  PT Problem List         PT Treatment Interventions      PT Goals (Current goals  can be found in the Care Plan section)  Acute Rehab PT Goals Patient Stated Goal: return home with spouse to assist PT Goal Formulation: With patient/family Time For Goal Achievement: 05/27/18 Potential to Achieve Goals: Good    Frequency     Barriers to discharge        Co-evaluation               AM-PAC PT "6 Clicks" Daily Activity  Outcome Measure Difficulty turning over in bed (including adjusting bedclothes, sheets and blankets)?: None Difficulty moving from lying on back to sitting on the side of the bed? : None Difficulty sitting down on and standing up from a chair with arms (e.g., wheelchair, bedside commode, etc,.)?: None Help needed moving to and from a bed to chair (including a wheelchair)?: None Help needed walking in hospital room?: None Help needed climbing 3-5 steps with a railing? : None 6 Click Score: 24    End of Session   Activity Tolerance: Patient tolerated treatment well Patient left: in bed;with call bell/phone within reach;with family/visitor present Nurse Communication: Mobility status PT Visit Diagnosis: Unsteadiness on feet (R26.81);Other abnormalities of gait and mobility (R26.89);Muscle weakness (generalized) (M62.81)    Time: 2094-7096 PT Time Calculation (min) (ACUTE ONLY): 20 min   Charges:   PT Evaluation $PT Eval Moderate Complexity: 1 Mod PT Treatments $Gait Training: 8-22 mins        1:46 PM, 05/27/18 Lonell Grandchild, MPT Physical Therapist with [facility] [phone] office [phone] mobile phone

## 2018-05-27 NOTE — Progress Notes (Signed)
    Subjective: 1 loose stool yesterday. Underlying nausea, no vomiting. No significant abdominal pain.   Objective: Vital signs in last 24 hours: Temp:  [98.2 F (36.8 C)-98.4 F (36.9 C)] 98.4 F (36.9 C) (08/06 0554) Pulse Rate:  [56-64] 56 (08/06 0554) Resp:  [18-20] 18 (08/06 0554) BP: (99-133)/(64-91) 133/91 (08/06 0554) SpO2:  [95 %-98 %] 98 % (08/06 0554) Last BM Date: 05/26/18 General:   Alert and oriented, pleasant Head:  Normocephalic and atraumatic. Eyes:  No icterus, sclera clear. Conjuctiva pink.  Abdomen:  Bowel sounds present, soft, non-tender, non-distended. No HSM or hernias noted. No rebound or guarding. No masses appreciated  Neurologic:  Alert and  oriented x4;  grossly normal neurologically. Psych:  Alert and cooperative. Normal mood and affect.  Intake/Output from previous day: 08/05 0701 - 08/06 0700 In: 4156.7 [I.V.:3586.7; IV Piggyback:570] Out: -  Intake/Output this shift: No intake/output data recorded.  Lab Results: Recent Labs    05/24/18 1313 05/26/18 0531  WBC 7.1 7.2  HGB 15.4* 14.0  HCT 42.3 41.2  PLT 235 163   BMET Recent Labs    05/25/18 0700 05/26/18 0531 05/27/18 0813  NA 140 141 140  K 2.9* 2.9* 3.1*  CL 105 107 110  CO2 28 26 24   GLUCOSE 121* 111* 113*  BUN 11 10 9   CREATININE 0.81 0.76 0.74  CALCIUM 8.7* 8.7* 8.5*   LFT Recent Labs    05/24/18 1313 05/26/18 0531  PROT 7.9 6.5  ALBUMIN 4.1 3.3*  AST 36 26  ALT 50* 36  ALKPHOS 65 51  BILITOT 0.6 0.5    Assessment: 52 year old female admitted with profound hypokalemia and weakness in setting of chronic intermittent diarrhea. Diarrhea lessened yesterday. Giardia remains pending; however, she was found to have positive Cdiff antigen but negative toxin. Cdiff PCR positive. Could be carrier vs inadequate sample to detect toxin. Clinically presented with significant diarrhea and has had prior exposure to antibiotics, so she will be treated for CDI with oral  vancomycin, first dose this morning.   Nausea: intermittent, no vomiting. Tolerating diet. Consider GES as outpatient if persistent despite CDI treatment.   Hypokalemia: potassium with slight increase to 3.1. Further management per attending. Hopefully will continue to improve as diarrhea is addressed.   Plan: Vancomycin 125 mg oral QID for 14 days Add probiotic Await Giardia Continue potassium repletion Low fat/lactose free diet, soft Continue Tums Avoid PPI in this setting due to CDI Discussed handwashing, sanitization measures as outpatient Consider GES if continued nausea despite CDI treatment    Annitta Needs, PhD, ANP-BC [facility]     LOS: 2 days    05/27/2018, 9:41 AM

## 2018-05-28 ENCOUNTER — Telehealth: Payer: Self-pay | Admitting: Gastroenterology

## 2018-05-28 LAB — GASTROINTESTINAL PANEL BY PCR, STOOL (REPLACES STOOL CULTURE)

## 2018-05-28 LAB — BASIC METABOLIC PANEL
Anion gap: 5 (ref 5–15)
BUN: 11 mg/dL (ref 6–20)
CALCIUM: 8.5 mg/dL — AB (ref 8.9–10.3)
CO2: 25 mmol/L (ref 22–32)
CREATININE: 0.77 mg/dL (ref 0.44–1.00)
Chloride: 111 mmol/L (ref 98–111)
Glucose, Bld: 100 mg/dL — ABNORMAL HIGH (ref 70–99)
Potassium: 3.3 mmol/L — ABNORMAL LOW (ref 3.5–5.1)
SODIUM: 141 mmol/L (ref 135–145)

## 2018-05-28 LAB — MAGNESIUM: MAGNESIUM: 1.9 mg/dL (ref 1.7–2.4)

## 2018-05-28 MED ORDER — POTASSIUM CHLORIDE CRYS ER 20 MEQ PO TBCR
60.0000 meq | EXTENDED_RELEASE_TABLET | Freq: Once | ORAL | Status: AC
  Administered 2018-05-28: 60 meq via ORAL
  Filled 2018-05-28: qty 3

## 2018-05-28 MED ORDER — VANCOMYCIN HCL 125 MG PO CAPS: 125.0000 mg | ORAL_CAPSULE | Freq: Four times a day (QID) | ORAL | 0 refills | Status: DC

## 2018-05-28 NOTE — Progress Notes (Signed)
Pt has home CPAP at bedside plugged into red outlet with no signs of damage. Pt encouraged to call should she need help with placement

## 2018-05-28 NOTE — Telephone Encounter (Signed)
Please arrange hospital follow-up with Dr. Oneida Alar in 3-4 months.

## 2018-05-28 NOTE — Progress Notes (Signed)
PROGRESS NOTE  Sarah Cooley  GGY:[id number]  DOB: Jun 11, 1966  DOA: 05/24/2018 PCP: Celene Squibb, MD  Brief Admission Hx: Sarah Cooley is a 52 y.o. female with medical history significant of hypertension, fibromyalgia, breast cancer, possible renal cell carcinoma, irritable bowel syndrome, MGUS, presents to the hospital with complaints of generalized weakness and myalgias/arthralgias.  She was admitted with severe hypokalemia, thought secondary to ongoing chronic diarrhea from IBS and concurrent use of thiazide diuretics.   MDM/Assessment & Plan:   1. Severe hypokalemia - slowly improving with aggressive replacement.  K up to 3.3. Oral potassium given today.    Continue to follow magnesium levels.   2. Severe diarrhea - Pt tested positive for C diff and started on oral vancomycin by GI team x 14 day course.   3. IBS - diarrhea predominant - Pt to follow up with Dr. Oneida Alar GI outpatient.   4. Essential hypertension - she is quite bradycardic and therefore we have been holding her home coreg/diltiazem.  5. GAD - resumed her home clonazepam.  6. OSA - continue nightly CPAP.  7. Fibromyalgia chronic fatigue syndrome  - chronic symptoms and she is followed by a rheumatologist for this.  8. Generalized weakness / chronic fatigue - PT eval 9. GERD - continue PPI therapy.    DVT prophylaxis: lovenox  Code Status: full  Family Communication: husband at bedside Disposition Plan: Home when medically stabilized  Consultants:  GI   Subjective: Pt had large watery bowel movement today.     Objective: Vitals:   05/27/18 1428 05/27/18 2200 05/27/18 2318 05/28/18 0629  BP: 123/78 122/64  (!) 141/94  Pulse: 60 64 68 (!) 57  Resp: 18 18 16 20   Temp: 98.8 F (37.1 C) 98.2 F (36.8 C)  98 F (36.7 C)  TempSrc: Oral Oral  Oral  SpO2:  97% 97% 100%  Weight:      Height:        Intake/Output Summary (Last 24 hours) at 05/28/2018 1205 Last data filed at 05/28/2018 0900 Gross per 24 hour    Intake 1955 ml  Output -  Net 1955 ml   Filed Weights   05/24/18 1231 05/24/18 1737  Weight: 99.3 kg (219 lb) 103.2 kg (227 lb 8.2 oz)   REVIEW OF SYSTEMS  As per history otherwise all reviewed and reported negative  Exam:  General exam: awake, alert, NAD, cooperative.  Respiratory system: Clear. No increased work of breathing. Cardiovascular system: S1 & S2 heard, RRR. No JVD, murmurs, gallops, clicks or pedal edema. Gastrointestinal system: Abdomen is nondistended, soft and nontender. Normal bowel sounds heard. Central nervous system: Alert and oriented. No focal neurological deficits. Extremities: no CCE.  Data Reviewed: Basic Metabolic Panel: Recent Labs  Lab 05/24/18 1313 05/25/18 0700 05/26/18 0531 05/27/18 0813 05/28/18 0613  NA 137 140 141 140 141  K <2.0* 2.9* 2.9* 3.1* 3.3*  CL 94* 105 107 110 111  CO2 32 28 26 24 25   GLUCOSE 116* 121* 111* 113* 100*  BUN 14 11 10 9 11   CREATININE 0.86 0.81 0.76 0.74 0.77  CALCIUM 9.3 8.7* 8.7* 8.5* 8.5*  MG 2.3 2.2 1.9 2.0 1.9   Liver Function Tests: Recent Labs  Lab 05/23/18 1059 05/24/18 1313 05/26/18 0531  AST 38 36 26  ALT 51* 50* 36  ALKPHOS 69 65 51  BILITOT 0.7 0.6 0.5  PROT 8.1 7.9 6.5  ALBUMIN 4.0 4.1 3.3*   No results for input(s): LIPASE, AMYLASE in  the last 168 hours. No results for input(s): AMMONIA in the last 168 hours. CBC: Recent Labs  Lab 05/23/18 1059 05/24/18 1313 05/26/18 0531  WBC 7.0 7.1 7.2  NEUTROABS 4.0 4.1  --   HGB 15.3* 15.4* 14.0  HCT 41.9 42.3 41.2  MCV 89.0 89.4 93.2  PLT 233 235 163   Cardiac Enzymes: No results for input(s): CKTOTAL, CKMB, CKMBINDEX, TROPONINI in the last 168 hours. CBG (last 3)  No results for input(s): GLUCAP in the last 72 hours. Recent Results (from the past 240 hour(s))  C difficile quick scan w PCR reflex     Status: Abnormal   Collection Time: 05/26/18  9:45 AM  Result Value Ref Range Status   C Diff antigen POSITIVE (A) NEGATIVE Final    C Diff toxin NEGATIVE NEGATIVE Final   C Diff interpretation Results are indeterminate. See PCR results.  Final    Comment: Performed at [facility], [address] [place], [place] [address]  Giardia/Cryptosporidium EIA     Status: None   Collection Time: 05/26/18  9:45 AM  Result Value Ref Range Status   Giardia Ag, Stl Negative Negative Final   Cryptosporidium EIA Negative Negative Final    Comment: (NOTE) Performed At: [facility] [place], [place] [id number] Rush Farmer MD VO:[id number]    Source of Sample STOOL  Final    Comment: Performed at [facility], [address] [place], [place] [address]  C. Diff by PCR, Reflexed     Status: Abnormal   Collection Time: 05/26/18  9:45 AM  Result Value Ref Range Status   Toxigenic C. Difficile by PCR POSITIVE (A) NEGATIVE Final    Comment: Positive for toxigenic C. difficile with little to no toxin production. Only treat if clinical presentation suggests symptomatic illness. Performed at [facility], [place] [address], [place], [place] [address]     Studies: No results found.  Scheduled Meds: . calcium carbonate  1 tablet Oral BID WC  . clonazePAM  1 mg Oral TID  . enoxaparin (LOVENOX) injection  40 mg Subcutaneous Q24H  . feeding supplement (ENSURE ENLIVE)  237 mL Oral BID BM  . FLUoxetine  60 mg Oral Daily  . pravastatin  10 mg Oral q1800  . saccharomyces boulardii  250 mg Oral BID  . topiramate  25 mg Oral Daily   And  . topiramate  50 mg Oral QHS  . vancomycin  125 mg Oral TID WC & HS   Continuous Infusions: . sodium chloride    . 0.9 % NaCl with KCl 40 mEq / L 50 mL/hr (05/27/18 1833)   Active Problems:   Hypertension   MGUS (monoclonal gammopathy of unknown significance)   Fibromyalgia   GERD (gastroesophageal reflux disease)   Anxiety and depression   Obstructive sleep apnea on CPAP   Hypokalemia   Hypokalemia, gastrointestinal losses   Prolonged Q-T interval on ECG   Weakness    Clostridium difficile diarrhea  Time spent:   Irwin Brakeman, MD, FAAFP [facility] Pager [phone]  If 7PM-7AM, please contact night-coverage [email or web address] Password [email or web address] 05/28/2018, 12:05 PM    LOS: 3 days

## 2018-05-28 NOTE — Progress Notes (Signed)
Pt has refused feeding supplement (Ensure). Pt states she "is unable to drink ensure"

## 2018-05-28 NOTE — Progress Notes (Signed)
    Subjective: Loose stool this morning, formed/soft stool yesterday. Frequency improving. Feeling better. Nervous to go home just yet. Nausea at baseline.   Objective: Vital signs in last 24 hours: Temp:  [98 F (36.7 C)-98.8 F (37.1 C)] 98 F (36.7 C) (08/07 0629) Pulse Rate:  [57-68] 57 (08/07 0629) Resp:  [16-20] 20 (08/07 0629) BP: (122-141)/(64-94) 141/94 (08/07 0629) SpO2:  [97 %-100 %] 100 % (08/07 0629) Last BM Date: 05/27/18 General:   Alert and oriented, pleasant Head:  Normocephalic and atraumatic. Abdomen:  Bowel sounds present, soft, non-tender, non-distended. No HSM or hernias noted. No rebound or guarding. No masses appreciated  Msk:  Symmetrical without gross deformities. Normal posture. Neurologic:  Alert and  oriented x4 Psych:  Alert and cooperative. Normal mood and affect.  Intake/Output from previous day: 08/06 0701 - 08/07 0700 In: 1955 [P.O.:840; I.V.:900; IV Piggyback:215] Out: -  Intake/Output this shift: No intake/output data recorded.  Lab Results: Recent Labs    05/26/18 0531  WBC 7.2  HGB 14.0  HCT 41.2  PLT 163   BMET Recent Labs    05/26/18 0531 05/27/18 0813 05/28/18 0613  NA 141 140 141  K 2.9* 3.1* 3.3*  CL 107 110 111  CO2 26 24 25   GLUCOSE 111* 113* 100*  BUN 10 9 11   CREATININE 0.76 0.74 0.77  CALCIUM 8.7* 8.5* 8.5*   LFT Recent Labs    05/26/18 0531  PROT 6.5  ALBUMIN 3.3*  AST 26  ALT 36  ALKPHOS 51  BILITOT 0.5    Assessment: 52 year old female admitted withprofound hypokalemia and weakness in setting ofchronic intermittent diarrhea.Found  to have positive Cdiff antigen but negative toxin. Cdiff PCR positive. Could be carrier vs inadequate sample to detect toxin; however, due to clinical presentation, oral vancomycin started yesterday for CDI treatment.  Clinically improving. As of note, Giardia negative. GI pathogen panel was ordered yesterday and in process; I doubt this will result prior to discharge  in next 24 hours.   Nausea:intermittent, no vomiting. Tolerating diet. Consider GES as outpatient if persistent despite CDI treatment.   Profound hypokalemia on admission: improved. Continue supplementation per attending.  If she continues to progress as she is doing, would anticipate appropriate for discharge home on 05/29/18.    Plan: Complete 14 day course of oral vancomycin 125 mg po QID Probiotics Avoid PPIs if possible Potassium supplementation Will arrange outpatient follow-up with Dr. Oneida Alar Hopeful discharge in next 24 hours   Annitta Needs, PhD, ANP-BC [facility]    LOS: 3 days    05/28/2018, 8:27 AM

## 2018-05-29 LAB — BASIC METABOLIC PANEL
ANION GAP: 4 — AB (ref 5–15)
BUN: 12 mg/dL (ref 6–20)
CO2: 23 mmol/L (ref 22–32)
Calcium: 8.5 mg/dL — ABNORMAL LOW (ref 8.9–10.3)
Chloride: 112 mmol/L — ABNORMAL HIGH (ref 98–111)
Creatinine, Ser: 0.75 mg/dL (ref 0.44–1.00)
GFR calc Af Amer: >60 mL/min (ref >60)
GFR calc non Af Amer: >60 mL/min (ref >60)
Glucose, Bld: 109 mg/dL — ABNORMAL HIGH (ref 70–99)
POTASSIUM: 3.6 mmol/L (ref 3.5–5.1)
Sodium: 139 mmol/L (ref 135–145)

## 2018-05-29 MED ORDER — SACCHAROMYCES BOULARDII 250 MG PO CAPS: 250.0000 mg | ORAL_CAPSULE | Freq: Two times a day (BID) | ORAL | 0 refills | Status: AC

## 2018-05-29 MED ORDER — TOPIRAMATE 25 MG PO TABS: 25.0000 mg | ORAL_TABLET | ORAL | Status: DC

## 2018-05-29 MED ORDER — POTASSIUM CHLORIDE CRYS ER 20 MEQ PO TBCR: 20.0000 meq | EXTENDED_RELEASE_TABLET | Freq: Every day | ORAL | 0 refills | Status: DC

## 2018-05-29 MED ORDER — CALCIUM CARBONATE ANTACID 500 MG PO CHEW: 1.0000 | CHEWABLE_TABLET | Freq: Two times a day (BID) | ORAL | Status: DC

## 2018-05-29 MED ORDER — VANCOMYCIN HCL 125 MG PO CAPS: 125.0000 mg | ORAL_CAPSULE | Freq: Four times a day (QID) | ORAL | 0 refills | Status: AC

## 2018-05-29 NOTE — Discharge Summary (Signed)
Physician Discharge Summary  Sarah Cooley JJK:[id number] DOB: January 08, 1966 DOA: 05/24/2018  PCP: Celene Squibb, MD  Admit date: 05/24/2018 Discharge date: 05/29/2018  Admitted From: Home  Disposition: Home   Recommendations for Outpatient Follow-up:  1. Follow up with PCP in 1 weeks 2. Follow up with GI in 3 months 3. Please obtain BMP/CBC in 1 week. 4. Please re-evaluate if patient can restart coreg/diltiazem, currently held due to bradycardia and low blood pressures 5. Please follow up on the following pending results: Final Cultures   Discharge Condition: STABLE   CODE STATUS: FULL    Brief Hospitalization Summary: Please see all hospital notes, images, labs for full details of the hospitalization.  HPI: Sarah Cooley is a 52 y.o. female with medical history significant of hypertension, fibromyalgia, breast cancer, possible renal cell carcinoma, irritable bowel syndrome, MGUS, presents to the hospital with complaints of generalized weakness and myalgias/arthralgias.  Patient reports that for several months, she has been feeling very tired, weak, aching in her joints and her muscles.  This is progressively gotten worse over the past few days.  She has chronic bouts of diarrhea and constipation, consistent with her IBS.  She last had 2 loose bowel movements yesterday, but has not had any further bowel movement since then.  She has felt nauseous lately but has not had any vomiting.  She has not had any shortness of breath, cough, fever.  No dysuria.  She had gone to her hematologist yesterday where labs were drawn and she was found to have potassium less than 2. She was referred to the ER for evaluation.  ED Course: Labs confirm a serum potassium of less than 2.  Magnesium is normal.  Urinalysis is unremarkable.  CBC is unrevealing otherwise.  EKG shows sinus bradycardia with prolonged QT.  She is been referred for admission.  Brief Admission Hx: Sarah Willard Herringis a 52 y.o.femalewith  medical history significant ofhypertension, fibromyalgia, breast cancer, possible renal cell carcinoma, irritable bowel syndrome, MGUS, presents to the hospital with complaints of generalized weakness and myalgias/arthralgias.  She was admitted with severe hypokalemia, thought secondary to ongoing chronic diarrhea from IBS and concurrent use of thiazide diuretics.   MDM/Assessment & Plan:   1. Severe hypokalemia - Resolved.  slowly improved with aggressive replacement.  K up to 3.6. Oral potassium 20 meq daily x 5 more days. Discontinued chlorthalidone.     2. C diff colitis with diarrhea - Pt tested positive for C diff and started on oral vancomycin by GI team x 14 day course.  probiotics ordered and AVOID PPI if possible recommended while treating the infection.   3. IBS - diarrhea predominant - Pt to follow up with Dr. Oneida Alar GI outpatient.   4. Essential hypertension - her blood pressures have been soft while on no blood pressure meds, she is quite bradycardic and therefore we have been holding her home coreg/diltiazem.  She was advised to follow up with primary care provider next week to discuss if she should restart them or not.   5. GAD - resumed her home clonazepam.  6. OSA - continue nightly CPAP.  7. Fibromyalgia chronic fatigue syndrome  - chronic symptoms and she is followed by a rheumatologist for this.  8. Generalized weakness / chronic fatigue - PT eval, no further recommendations.  9. GERD - continue PPI therapy.    DVT prophylaxis: lovenox  Code Status: full  Family Communication: husband at bedside Disposition Plan: Home  Consultants:  GI  Discharge Diagnoses:  Active Problems:   Hypertension   MGUS (monoclonal gammopathy of unknown significance)   Fibromyalgia   GERD (gastroesophageal reflux disease)   Anxiety and depression   Obstructive sleep apnea on CPAP   Hypokalemia   Hypokalemia, gastrointestinal losses   Prolonged Q-T interval on ECG   Weakness    Clostridium difficile diarrhea  Discharge Instructions: Discharge Instructions    Call MD for:  difficulty breathing, headache or visual disturbances   Complete by:  As directed    Call MD for:  extreme fatigue   Complete by:  As directed    Call MD for:  hives   Complete by:  As directed    Call MD for:  persistant dizziness or light-headedness   Complete by:  As directed    Call MD for:  persistant nausea and vomiting   Complete by:  As directed    Increase activity slowly   Complete by:  As directed      Allergies as of 05/29/2018      Reactions   Ramipril Nausea And Vomiting, Other (See Comments)   Ramipril--Caused Pancreatitis   Shrimp [shellfish Allergy] Anaphylaxis, Swelling   Minocycline Hcl Hives   Altace [ramipril] Nausea And Vomiting, Nausea Only   Lyrica [pregabalin] Nausea And Vomiting   Adhesive [tape] Rash   Please use paper tape   Bactrim [sulfamethoxazole-trimethoprim] Other (See Comments)   May have caused kidney issues   Drixoral [brompheniramine-pseudoeph] Rash   Erythromycin Other (See Comments)   GI- Upset / severe abdominal pain   Minocin [minocycline Hcl] Nausea And Vomiting, Rash   Septra [sulfamethoxazole-trimethoprim] Rash   Sinutab [chlorphen-pseudoephed-apap] Rash      Medication List    STOP taking these medications   carvedilol 12.5 MG tablet Commonly known as:  COREG   chlorthalidone 25 MG tablet Commonly known as:  HYGROTON   diltiazem 120 MG tablet Commonly known as:  CARDIZEM   pantoprazole 40 MG tablet Commonly known as:  PROTONIX     TAKE these medications   calcium carbonate 500 MG chewable tablet Commonly known as:  TUMS - dosed in mg elemental calcium Chew 1 tablet (200 mg of elemental calcium total) by mouth 2 (two) times daily with breakfast and lunch.   clonazePAM 1 MG tablet Commonly known as:  KLONOPIN Take 1 mg by mouth 3 (three) times daily.   FLUoxetine HCl 60 MG Tabs Take 60 mg by mouth daily.   OVER THE  COUNTER MEDICATION Take 1 capsule by mouth daily as needed. Diphenhydramate Over The Counter Anti-emetic   potassium chloride SA 20 MEQ tablet Commonly known as:  K-DUR,KLOR-CON Take 1 tablet (20 mEq total) by mouth daily for 5 days.   pravastatin 10 MG tablet Commonly known as:  PRAVACHOL Take 10 mg by mouth daily.   saccharomyces boulardii 250 MG capsule Commonly known as:  FLORASTOR Take 1 capsule (250 mg total) by mouth 2 (two) times daily.   topiramate 25 MG tablet Commonly known as:  TOPAMAX Take 1 tablet (25 mg total) by mouth See admin instructions. Take one in the morning and 2 in the evening   traMADol 50 MG tablet Commonly known as:  ULTRAM Take 1 tablet by mouth daily as needed.   vancomycin 125 MG capsule Commonly known as:  VANCOCIN Take 1 capsule (125 mg total) by mouth 4 (four) times daily for 12 days.   Vitamin D (Ergocalciferol) 50000 units Caps capsule Commonly known as:  DRISDOL Take 50,000  Units by mouth every 7 (seven) days. Take 1 tablet (50,000 units) by mouth every Thursday      Follow-up Information    Celene Squibb, MD. Schedule an appointment as soon as possible for a visit in 1 week(s).   Specialty:  Internal Medicine Contact information: [facility] [address] [phone]        Danie Binder, MD. Schedule an appointment as soon as possible for a visit in 1 month(s).   Specialty:  Gastroenterology Contact information: [address] [place] [address] [phone]        Zoila Shutter, MD. Go in 1 week(s).   Specialty:  Oncology Contact information: [place] [address] [phone]          Allergies  Allergen Reactions  . Ramipril Nausea And Vomiting and Other (See Comments)    Ramipril--Caused Pancreatitis  . Shrimp [Shellfish Allergy] Anaphylaxis and Swelling  . Minocycline Hcl Hives  . Altace [Ramipril] Nausea And Vomiting and Nausea Only  . Lyrica [Pregabalin] Nausea And  Vomiting  . Adhesive [Tape] Rash    Please use paper tape  . Bactrim [Sulfamethoxazole-Trimethoprim] Other (See Comments)    May have caused kidney issues  . Drixoral [Brompheniramine-Pseudoeph] Rash  . Erythromycin Other (See Comments)    GI- Upset / severe abdominal pain  . Minocin [Minocycline Hcl] Nausea And Vomiting and Rash  . Septra [Sulfamethoxazole-Trimethoprim] Rash  . Sinutab [Chlorphen-Pseudoephed-Apap] Rash   Allergies as of 05/29/2018      Reactions   Ramipril Nausea And Vomiting, Other (See Comments)   Ramipril--Caused Pancreatitis   Shrimp [shellfish Allergy] Anaphylaxis, Swelling   Minocycline Hcl Hives   Altace [ramipril] Nausea And Vomiting, Nausea Only   Lyrica [pregabalin] Nausea And Vomiting   Adhesive [tape] Rash   Please use paper tape   Bactrim [sulfamethoxazole-trimethoprim] Other (See Comments)   May have caused kidney issues   Drixoral [brompheniramine-pseudoeph] Rash   Erythromycin Other (See Comments)   GI- Upset / severe abdominal pain   Minocin [minocycline Hcl] Nausea And Vomiting, Rash   Septra [sulfamethoxazole-trimethoprim] Rash   Sinutab [chlorphen-pseudoephed-apap] Rash      Medication List    STOP taking these medications   carvedilol 12.5 MG tablet Commonly known as:  COREG   chlorthalidone 25 MG tablet Commonly known as:  HYGROTON   diltiazem 120 MG tablet Commonly known as:  CARDIZEM   pantoprazole 40 MG tablet Commonly known as:  PROTONIX     TAKE these medications   calcium carbonate 500 MG chewable tablet Commonly known as:  TUMS - dosed in mg elemental calcium Chew 1 tablet (200 mg of elemental calcium total) by mouth 2 (two) times daily with breakfast and lunch.   clonazePAM 1 MG tablet Commonly known as:  KLONOPIN Take 1 mg by mouth 3 (three) times daily.   FLUoxetine HCl 60 MG Tabs Take 60 mg by mouth daily.   OVER THE COUNTER MEDICATION Take 1 capsule by mouth daily as needed. Diphenhydramate Over The Counter  Anti-emetic   potassium chloride SA 20 MEQ tablet Commonly known as:  K-DUR,KLOR-CON Take 1 tablet (20 mEq total) by mouth daily for 5 days.   pravastatin 10 MG tablet Commonly known as:  PRAVACHOL Take 10 mg by mouth daily.   saccharomyces boulardii 250 MG capsule Commonly known as:  FLORASTOR Take 1 capsule (250 mg total) by mouth 2 (two) times daily.   topiramate 25 MG tablet Commonly known as:  TOPAMAX Take 1 tablet (25 mg total) by mouth See admin instructions. Take one in the morning and 2 in the evening   traMADol 50 MG tablet Commonly known as:  ULTRAM Take 1 tablet by mouth daily as needed.   vancomycin 125 MG capsule Commonly known as:  VANCOCIN Take 1 capsule (125 mg total) by mouth 4 (four) times daily for 12 days.   Vitamin D (Ergocalciferol) 50000 units Caps capsule Commonly known as:  DRISDOL Take 50,000 Units by mouth every 7 (seven) days. Take 1 tablet (50,000 units) by mouth every Thursday       Procedures/Studies: Ct Abdomen Pelvis W Contrast  Addendum Date: 05/08/2018   ADDENDUM REPORT: 05/08/2018 08:06 ADDENDUM: The MRI abdomen was not available for comparison at the time of dictation of the CT abdomen pelvis of 04/30/2017. Also, the dictation from that external MRI was not available. Again the lesion in the left kidney is very worrisome for renal cell carcinoma. However, the lesion described as simple appearing cyst on the right posteriorly does have a high attenuation of 31 HU on the initial images and 32 HU on delayed images. This is worrisome also for a small renal cell carcinoma. Electronically Signed   By: Ivar Drape M.D.   On: 05/08/2018 08:06   Result Date: 05/08/2018 CLINICAL DATA:  Left lower quadrant pain, nausea and diarrhea for several months, the patient has been on antibiotics with no improvement EXAM: CT ABDOMEN AND PELVIS WITH CONTRAST TECHNIQUE: Multidetector CT imaging of the abdomen and pelvis was performed using the standard protocol  following bolus administration of intravenous contrast. CONTRAST:  131mL ISOVUE-300 IOPAMIDOL (ISOVUE-300) INJECTION 61% COMPARISON:  CT abdomen pelvis of 01/18/2017 FINDINGS: Lower chest: The lung bases clear. Heart is within normal limits in size. No pericardial effusion is seen. Hepatobiliary: The liver enhances with no focal abnormality and no ductal dilatation is seen. Surgical clips are present from prior cholecystectomy. Pancreas: The pancreas is normal in size and the pancreatic duct is not dilated. Spleen: The spleen is unremarkable. Adrenals/Urinary Tract: The adrenal glands appear normal. The kidneys enhance, and there is a simple appearing cyst emanating from the posterior right mid kidney which was present previously. However, at the site questioned in the posterior left kidney, there is a rounded inhomogeneous soft tissue mass 2.1 x 2.1 cm. On the early images this area has an attenuation 128 HU, and on delayed images attenuation 86 HU. This is very worrisome for small renal cell carcinoma. No hydronephrosis is seen. The ureters are normal in caliber to the urinary bladder which is decompressed and cannot be evaluated. Stomach/Bowel: The stomach is decompressed and cannot be evaluated. No small bowel dilatation or edema is seen. Colon is moderately well visualized with no definite abnormality. Minimal thickening of the mucosa of the sigmoid colon is present of questionable significance. This mild thickening does not appear to be prominent enough to be indicative of colitis. No diverticula are seen. The terminal ileum and the appendix are unremarkable. Vascular/Lymphatic: The abdominal aorta is normal in caliber. No adenopathy is seen. The left renal vein appears patent. Reproductive: The uterus has previously been resected. No adnexal lesion is seen. No fluid is noted within the pelvis. Other: No abdominal wall hernia is noted. Musculoskeletal: The lumbar vertebrae are in normal alignment with normal  disc spaces. Mild posterior spurring is noted at L5-S1. No pars defect is seen. The SI joints appear corticated. IMPRESSION: 1. No definite explanation for the patient's symptoms is seen.  There is only mild thickening of the mucosa of the rectosigmoid colon of questionable significance. The appendix and terminal ileum are unremarkable. 2. However, there is a significant finding within the posterior mid right kidney with an inhomogeneous rounded mass of 2.1 x 2.1 cm present, worrisome for renal cell carcinoma. Recommend correlation with outside MR study done recently. Electronically Signed: By: Ivar Drape M.D. On: 04/30/2018 09:39     Subjective: Pt says she feels a lot better today.  Stools are more formed and less frequent.  Tolerating oral vancomycin.   Discharge Exam: Vitals:   05/28/18 2128 05/29/18 0520  BP:  (!) 124/93  Pulse:  (!) 54  Resp:  18  Temp:  98 F (36.7 C)  SpO2: 96% 99%   Vitals:   05/28/18 1422 05/28/18 2104 05/28/18 2128 05/29/18 0520  BP: 118/73 117/83  (!) 124/93  Pulse: (!) 57 61  (!) 54  Resp: 14 18  18   Temp: 97.9 F (36.6 C) 97.9 F (36.6 C)  98 F (36.7 C)  TempSrc: Oral Oral  Oral  SpO2: 98% 98% 96% 99%  Weight:      Height:       General exam: awake, alert, NAD, cooperative.  Respiratory system: Clear. No increased work of breathing. Cardiovascular system: S1 & S2 heard, RRR. No JVD, murmurs, gallops, clicks or pedal edema. Gastrointestinal system: Abdomen is nondistended, soft and nontender. Normal bowel sounds heard. Central nervous system: Alert and oriented. No focal neurological deficits. Extremities: no CCE.   The results of significant diagnostics from this hospitalization (including imaging, microbiology, ancillary and laboratory) are listed below for reference.     Microbiology: Recent Results (from the past 240 hour(s))  C difficile quick scan w PCR reflex     Status: Abnormal   Collection Time: 05/26/18  9:45 AM  Result Value Ref  Range Status   C Diff antigen POSITIVE (A) NEGATIVE Final   C Diff toxin NEGATIVE NEGATIVE Final   C Diff interpretation Results are indeterminate. See PCR results.  Final    Comment: Performed at [facility], [address] [place], [place] [address]  Giardia/Cryptosporidium EIA     Status: None   Collection Time: 05/26/18  9:45 AM  Result Value Ref Range Status   Giardia Ag, Stl Negative Negative Final   Cryptosporidium EIA Negative Negative Final    Comment: (NOTE) Performed At: [facility] [place], [place] [id number] Rush Farmer MD QA:[id number]    Source of Sample STOOL  Final    Comment: Performed at [facility], [address] [place], [place] [address]  C. Diff by PCR, Reflexed     Status: Abnormal   Collection Time: 05/26/18  9:45 AM  Result Value Ref Range Status   Toxigenic C. Difficile by PCR POSITIVE (A) NEGATIVE Final    Comment: Positive for toxigenic C. difficile with little to no toxin production. Only treat if clinical presentation suggests symptomatic illness. Performed at [facility], [place] [address], [place], [place] [address]   Gastrointestinal Panel by PCR , Stool     Status: None   Collection Time: 05/27/18  2:00 PM  Result Value Ref Range Status   Campylobacter species NOT DETECTED NOT DETECTED Final   Plesimonas shigelloides NOT DETECTED NOT DETECTED Final   Salmonella species NOT DETECTED NOT DETECTED Final   Yersinia enterocolitica NOT DETECTED NOT DETECTED Final   Vibrio species NOT DETECTED NOT DETECTED Final   Vibrio cholerae NOT  DETECTED NOT DETECTED Final   Enteroaggregative E coli (EAEC) NOT DETECTED NOT DETECTED Final   Enteropathogenic E coli (EPEC) NOT DETECTED NOT DETECTED Final   Enterotoxigenic E coli (ETEC) NOT DETECTED NOT DETECTED Final   Shiga like toxin producing E coli (STEC) NOT DETECTED NOT DETECTED Final   Shigella/Enteroinvasive E coli (EIEC) NOT DETECTED NOT DETECTED Final   Cryptosporidium NOT  DETECTED NOT DETECTED Final   Cyclospora cayetanensis NOT DETECTED NOT DETECTED Final   Entamoeba histolytica NOT DETECTED NOT DETECTED Final   Giardia lamblia NOT DETECTED NOT DETECTED Final   Adenovirus F40/41 NOT DETECTED NOT DETECTED Final   Astrovirus NOT DETECTED NOT DETECTED Final   Norovirus GI/GII NOT DETECTED NOT DETECTED Final   Rotavirus A NOT DETECTED NOT DETECTED Final   Sapovirus (I, II, IV, and V) NOT DETECTED NOT DETECTED Final    Comment: Performed at [facility], [address] [place], [place] [address]     Labs: BNP (last 3 results) No results for input(s): BNP in the last 8760 hours. Basic Metabolic Panel: Recent Labs  Lab 05/24/18 1313 05/25/18 0700 05/26/18 0531 05/27/18 0813 05/28/18 0613 05/29/18 0555  NA 137 140 141 140 141 139  K <2.0* 2.9* 2.9* 3.1* 3.3* 3.6  CL 94* 105 107 110 111 112*  CO2 32 28 26 24 25 23   GLUCOSE 116* 121* 111* 113* 100* 109*  BUN 14 11 10 9 11 12   CREATININE 0.86 0.81 0.76 0.74 0.77 0.75  CALCIUM 9.3 8.7* 8.7* 8.5* 8.5* 8.5*  MG 2.3 2.2 1.9 2.0 1.9  --    Liver Function Tests: Recent Labs  Lab 05/23/18 1059 05/24/18 1313 05/26/18 0531  AST 38 36 26  ALT 51* 50* 36  ALKPHOS 69 65 51  BILITOT 0.7 0.6 0.5  PROT 8.1 7.9 6.5  ALBUMIN 4.0 4.1 3.3*   No results for input(s): LIPASE, AMYLASE in the last 168 hours. No results for input(s): AMMONIA in the last 168 hours. CBC: Recent Labs  Lab 05/23/18 1059 05/24/18 1313 05/26/18 0531  WBC 7.0 7.1 7.2  NEUTROABS 4.0 4.1  --   HGB 15.3* 15.4* 14.0  HCT 41.9 42.3 41.2  MCV 89.0 89.4 93.2  PLT 233 235 163   Cardiac Enzymes: No results for input(s): CKTOTAL, CKMB, CKMBINDEX, TROPONINI in the last 168 hours. BNP: Invalid input(s): POCBNP CBG: No results for input(s): GLUCAP in the last 168 hours. D-Dimer No results for input(s): DDIMER in the last 72 hours. Hgb A1c No results for input(s): HGBA1C in the last 72 hours. Lipid Profile No results for  input(s): CHOL, HDL, LDLCALC, TRIG, CHOLHDL, LDLDIRECT in the last 72 hours. Thyroid function studies No results for input(s): TSH, T4TOTAL, T3FREE, THYROIDAB in the last 72 hours.  Invalid input(s): FREET3 Anemia work up No results for input(s): VITAMINB12, FOLATE, FERRITIN, TIBC, IRON, RETICCTPCT in the last 72 hours. Urinalysis    Component Value Date/Time   COLORURINE YELLOW 05/24/2018 1431   APPEARANCEUR CLEAR 05/24/2018 1431   LABSPEC 1.008 05/24/2018 1431   PHURINE 8.0 05/24/2018 1431   GLUCOSEU NEGATIVE 05/24/2018 1431   HGBUR NEGATIVE 05/24/2018 1431   BILIRUBINUR NEGATIVE 05/24/2018 1431   KETONESUR NEGATIVE 05/24/2018 1431   PROTEINUR NEGATIVE 05/24/2018 1431   NITRITE NEGATIVE 05/24/2018 1431   LEUKOCYTESUR MODERATE (A) 05/24/2018 1431   Sepsis Labs Invalid input(s): PROCALCITONIN,  WBC,  LACTICIDVEN Microbiology Recent Results (from the past 240 hour(s))  C difficile quick scan w PCR reflex  Status: Abnormal   Collection Time: 05/26/18  9:45 AM  Result Value Ref Range Status   C Diff antigen POSITIVE (A) NEGATIVE Final   C Diff toxin NEGATIVE NEGATIVE Final   C Diff interpretation Results are indeterminate. See PCR results.  Final    Comment: Performed at [facility], [address] [place], [place] [address]  Giardia/Cryptosporidium EIA     Status: None   Collection Time: 05/26/18  9:45 AM  Result Value Ref Range Status   Giardia Ag, Stl Negative Negative Final   Cryptosporidium EIA Negative Negative Final    Comment: (NOTE) Performed At: [facility] [place], [place] [id number] Rush Farmer MD NU:[id number]    Source of Sample STOOL  Final    Comment: Performed at [facility], [address], [place], [place] [address]  C. Diff by PCR, Reflexed     Status: Abnormal   Collection Time: 05/26/18  9:45 AM  Result Value Ref Range Status   Toxigenic C. Difficile by PCR POSITIVE (A) NEGATIVE Final    Comment: Positive for toxigenic  C. difficile with little to no toxin production. Only treat if clinical presentation suggests symptomatic illness. Performed at [facility], [place] [address] [place], [place] [address]   Gastrointestinal Panel by PCR , Stool     Status: None   Collection Time: 05/27/18  2:00 PM  Result Value Ref Range Status   Campylobacter species NOT DETECTED NOT DETECTED Final   Plesimonas shigelloides NOT DETECTED NOT DETECTED Final   Salmonella species NOT DETECTED NOT DETECTED Final   Yersinia enterocolitica NOT DETECTED NOT DETECTED Final   Vibrio species NOT DETECTED NOT DETECTED Final   Vibrio cholerae NOT DETECTED NOT DETECTED Final   Enteroaggregative E coli (EAEC) NOT DETECTED NOT DETECTED Final   Enteropathogenic E coli (EPEC) NOT DETECTED NOT DETECTED Final   Enterotoxigenic E coli (ETEC) NOT DETECTED NOT DETECTED Final   Shiga like toxin producing E coli (STEC) NOT DETECTED NOT DETECTED Final   Shigella/Enteroinvasive E coli (EIEC) NOT DETECTED NOT DETECTED Final   Cryptosporidium NOT DETECTED NOT DETECTED Final   Cyclospora cayetanensis NOT DETECTED NOT DETECTED Final   Entamoeba histolytica NOT DETECTED NOT DETECTED Final   Giardia lamblia NOT DETECTED NOT DETECTED Final   Adenovirus F40/41 NOT DETECTED NOT DETECTED Final   Astrovirus NOT DETECTED NOT DETECTED Final   Norovirus GI/GII NOT DETECTED NOT DETECTED Final   Rotavirus A NOT DETECTED NOT DETECTED Final   Sapovirus (I, II, IV, and V) NOT DETECTED NOT DETECTED Final    Comment: Performed at [facility], [address] [place], [place] [address]   Time coordinating discharge: 36 minutes  SIGNED:  Irwin Brakeman, MD  [facility] 05/29/2018, 10:51 AM Pager [phone]  If 7PM-7AM, please contact night-coverage [email or web address] Password [email or web address]

## 2018-05-29 NOTE — Discharge Instructions (Signed)
Avoid taking pantoprozole if possible while treating the c diff infection.   You can take over the counter probiotics if the pharmacy doesn't carry florastor.   Please follow up with primary care physician next week to discuss if you should restart coreg and diltiazem.  We are holding it now due to low heart rate.   Hypokalemia Hypokalemia means that the amount of potassium in the blood is lower than normal.Potassium is a chemical that helps regulate the amount of fluid in the body (electrolyte). It also stimulates muscle tightening (contraction) and helps nerves work properly.Normally, most of the bodys potassium is inside of cells, and only a very small amount is in the blood. Because the amount in the blood is so small, minor changes to potassium levels in the blood can be life-threatening. What are the causes? This condition may be caused by:  Antibiotic medicine.  Diarrhea or vomiting. Taking too much of a medicine that helps you have a bowel movement (laxative) can cause diarrhea and lead to hypokalemia.  Chronic kidney disease (CKD).  Medicines that help the body get rid of excess fluid (diuretics).  Eating disorders, such as bulimia.  Low magnesium levels in the body.  Sweating a lot.  What are the signs or symptoms? Symptoms of this condition include:  Weakness.  Constipation.  Fatigue.  Muscle cramps.  Mental confusion.  Skipped heartbeats or irregular heartbeat (palpitations).  Tingling or numbness.  How is this diagnosed? This condition is diagnosed with a blood test. How is this treated? Hypokalemia can be treated by taking potassium supplements by mouth or adjusting the medicines that you take. Treatment may also include eating more foods that contain a lot of potassium. If your potassium level is very low, you may need to get potassium through an IV tube in one of your veins and be monitored in the hospital. Follow these instructions at home:  Take  over-the-counter and prescription medicines only as told by your health care provider. This includes vitamins and supplements.  Eat a healthy diet. A healthy diet includes fresh fruits and vegetables, whole grains, healthy fats, and lean proteins.  If instructed, eat more foods that contain a lot of potassium, such as: ? Nuts, such as peanuts and pistachios. ? Seeds, such as sunflower seeds and pumpkin seeds. ? Peas, lentils, and lima beans. ? Whole grain and bran cereals and breads. ? Fresh fruits and vegetables, such as apricots, avocado, bananas, cantaloupe, kiwi, oranges, tomatoes, asparagus, and potatoes. ? Orange juice. ? Tomato juice. ? Red meats. ? Yogurt.  Keep all follow-up visits as told by your health care provider. This is important. Contact a health care provider if:  You have weakness that gets worse.  You feel your heart pounding or racing.  You vomit.  You have diarrhea.  You have diabetes (diabetes mellitus) and you have trouble keeping your blood sugar (glucose) in your target range. Get help right away if:  You have chest pain.  You have shortness of breath.  You have vomiting or diarrhea that lasts for more than 2 days.  You faint. This information is not intended to replace advice given to you by your health care provider. Make sure you discuss any questions you have with your health care provider. Document Released: 10/08/2005 Document Revised: 05/26/2016 Document Reviewed: 05/26/2016 Elsevier Interactive Patient Education  2018 [employer].    Clostridium Difficile Infection Clostridium difficile (C. difficile or C. diff) infection causes inflammation of the large intestine (colon). This  condition can result in damage to the lining of your colon and may lead to another condition called colitis. This infection can be passed from person to person (is contagious). Follow these instructions at home: Eating and drinking  Drink enough fluid to keep  your pee (urine) clear or pale yellow.  Avoid drinking: ? Milk. ? Caffeine. ? Alcohol.  Follow exact instructions from your doctor about how to get enough fluid in your body (rehydrate).  Eat small meals often instead of large meals. Medicines  Take your antibiotic medicine as told by your doctor. Do not stop taking the antibiotic even if you start to feel better unless your doctor told you to do that.  Take over-the-counter and prescription medicines only as told by your doctor.  Do not use medicines to help with watery poop (diarrhea). General instructions  Wash your hands fully before you prepare food and after you use the bathroom. Make sure people who live with you also wash their  hands often.  Clean the surfaces that you touch. Use a product that contains chlorine bleach.  Keep all follow-up visits as told by your doctor. This is important. Contact a doctor if:  Your symptoms do not get better with treatment.  Your symptoms get worse with treatment.  Your symptoms go away and then come back.  You have a fever.  You have new symptoms. Get help right away if:  You have more pain or tenderness in your belly (abdomen).  Your poop (stool) is mostly bloody.  Your poop looks dark black and tarry.  You cannot eat or drink without throwing up (vomiting).  You have signs of dehydration, such as: ? Dark pee, very little pee, or no pee. ? Cracked lips. ? Not making tears when you cry. ? Dry mouth. ? Sunken eyes. ? Feeling sleepy. ? Feeling weak. ? Feeling dizzy. This information is not intended to replace advice given to you by your health care provider. Make sure you discuss any questions you have with your health care provider. Document Released: 08/05/2009 Document Revised: 03/15/2016 Document Reviewed: 04/11/2015 Elsevier Interactive Patient Education  2017 [place].     Follow with Primary MD  Celene Squibb, MD  and other consultants as instructed  your Hospitalist MD  Please get a complete blood count and chemistry panel checked by your Primary MD at your next visit, and again as instructed by your Primary MD.  Get Medicines reviewed and adjusted: Please take all your medications with you for your next visit with your Primary MD  Laboratory/radiological data: Please request your Primary MD to go over all hospital tests and procedure/radiological results at the follow up, please ask your Primary MD to get all Hospital records sent to his/her office.  In some cases, they will be blood work, cultures and biopsy results pending at the time of your discharge. Please request that your primary care M.D. follows up on these results.  Also Note the following: If you experience worsening of your admission symptoms, develop shortness of breath, life threatening emergency, suicidal or homicidal thoughts you must seek medical attention immediately by calling 911 or calling your MD immediately  if symptoms less severe.  You must read complete instructions/literature along with all the possible adverse reactions/side effects for all the Medicines you take and that have been prescribed to you. Take any new Medicines after you have completely understood and accpet all the possible adverse reactions/side effects.   Do not drive when taking Pain  medications or sleeping medications (Benzodaizepines)  Do not take more than prescribed Pain, Sleep and Anxiety Medications. It is not advisable to combine anxiety,sleep and pain medications without talking with your primary care practitioner  Special Instructions: If you have smoked or chewed Tobacco  in the last 2 yrs please stop smoking, stop any regular Alcohol  and or any Recreational drug use.  Wear Seat belts while driving.  Please note: You were cared for by a hospitalist during your hospital stay. Once you are discharged, your primary care physician will handle any further medical issues. Please note  that NO REFILLS for any discharge medications will be authorized once you are discharged, as it is imperative that you return to your primary care physician (or establish a relationship with a primary care physician if you do not have one) for your post hospital discharge needs so that they can reassess your need for medications and monitor your lab values.

## 2018-05-29 NOTE — Progress Notes (Signed)
Pt's IV removed and site clean dry and intact. Discharge instructions including follow up appointments and medications were reviewed and discussed with patient Sarah Cooley). All questions were answered and no further questions at this time. Pt in stable condition and in no acute distress at time of discharge. Pt will be escorted by nurse tech.

## 2018-05-30 ENCOUNTER — Ambulatory Visit (HOSPITAL_COMMUNITY): Payer: BLUE CROSS/BLUE SHIELD | Admitting: Internal Medicine

## 2018-06-02 DIAGNOSIS — H5010 Unspecified exotropia: Secondary | ICD-10-CM | POA: Diagnosis not present

## 2018-06-02 NOTE — Telephone Encounter (Signed)
REMINDER IN EPIC °

## 2018-06-03 DIAGNOSIS — D3000 Benign neoplasm of unspecified kidney: Secondary | ICD-10-CM | POA: Diagnosis not present

## 2018-06-06 ENCOUNTER — Inpatient Hospital Stay (HOSPITAL_COMMUNITY): Payer: BLUE CROSS/BLUE SHIELD | Admitting: Internal Medicine

## 2018-06-06 ENCOUNTER — Encounter (HOSPITAL_COMMUNITY): Payer: Self-pay | Admitting: Internal Medicine

## 2018-06-06 VITALS — BP 130/80 | HR 62 | Temp 98.3°F | Resp 16 | Wt 228.7 lb

## 2018-06-06 DIAGNOSIS — Z171 Estrogen receptor negative status [ER-]: Secondary | ICD-10-CM | POA: Diagnosis not present

## 2018-06-06 DIAGNOSIS — Z923 Personal history of irradiation: Secondary | ICD-10-CM | POA: Diagnosis not present

## 2018-06-06 DIAGNOSIS — D0511 Intraductal carcinoma in situ of right breast: Secondary | ICD-10-CM | POA: Diagnosis not present

## 2018-06-06 DIAGNOSIS — N2889 Other specified disorders of kidney and ureter: Secondary | ICD-10-CM | POA: Diagnosis not present

## 2018-06-06 DIAGNOSIS — E876 Hypokalemia: Secondary | ICD-10-CM | POA: Diagnosis not present

## 2018-06-06 DIAGNOSIS — D242 Benign neoplasm of left breast: Secondary | ICD-10-CM | POA: Diagnosis not present

## 2018-06-06 DIAGNOSIS — B9689 Other specified bacterial agents as the cause of diseases classified elsewhere: Secondary | ICD-10-CM

## 2018-06-06 NOTE — Patient Instructions (Signed)
[facility] Discharge Instructions  You saw Dr. Higgs today.   Thank you for choosing [facility] to provide your oncology and hematology care.  To afford each patient quality time with our provider, please arrive at least 15 minutes before your scheduled appointment time.   If you have a lab appointment with the Cancer Center please come in thru the  Main Entrance and check in at the main information desk  You need to re-schedule your appointment should you arrive 10 or more minutes late.  We strive to give you quality time with our providers, and arriving late affects you and other patients whose appointments are after yours.  Also, if you no show three or more times for appointments you may be dismissed from the clinic at the providers discretion.     Again, thank you for choosing [facility].  Our hope is that these requests will decrease the amount of time that you wait before being seen by our physicians.       _____________________________________________________________  Should you have questions after your visit to [facility], please contact our office at [phone] between the hours of 8:00 a.m. and 4:30 p.m.  Voicemails left after 4:00 p.m. will not be returned until the following business day.  For prescription refill requests, have your pharmacy contact our office and allow 72 hours.    Cancer Center Support Programs:   > Cancer Support Group  2nd Tuesday of the month 1pm-2pm, Journey Room    

## 2018-06-06 NOTE — Progress Notes (Signed)
Diagnosis Ductal carcinoma in situ (DCIS) of right breast - Plan: CBC with Differential/Platelet, Comprehensive metabolic panel, Lactate dehydrogenase, Protein electrophoresis, serum, Ferritin, IgG, IgA, IgM, Kappa/lambda light chains, Beta 2 microglobulin, serum  Staging Cancer Staging Breast cancer of lower-inner quadrant of right female breast [facility]) Staging form: Breast, AJCC 7th Edition - Clinical stage from 08/31/2015: Stage 0 (Tis (DCIS), N0, M0) - Unsigned Staging comments: Staged at breast conference on 11.9.16 - Pathologic stage from 10/13/2015: Stage 0 (Tis (DCIS), N0, cM0) - Signed by Holley Bouche, NP on 02/09/2016   Assessment and Plan:  1.  High Grade DCIS R breast.  Pt was previously followed by Dr. Talbert Cage.  She is ER- PR- and had Negative Sentinel LN.  She underwent Right breast seed localized partial mastectomy with right axillary sentinel lymph node mapping Dr. Brantley Stage 10/13/2015.  She was treated with Adjuvant XRT.  Pt had bilateral diagnostic mammogram done 03/19/2017 that showed IMPRESSION: Expected postsurgical changes, post right breast lumpectomy and radiation therapy. No imaging evidence of malignancy.  RECOMMENDATION: Surgical consultation and contrast-enhanced breast MRI are recommended, because of patient's right nipple discharge.   She had bilateral breast MRI done 04/19/2017 that showed  IMPRESSION: 1. Enhancing mass within the upper inner quadrant of the left breast, measuring 6 mm, with suspicious washout kinetics. 2. Enhancing mass within the upper-outer quadrant of the left breast, measuring 7 mm, also with suspicious washout kinetics. 3. No evidence of malignancy within the right breast. Post lumpectomy changes within the right breast.  RECOMMENDATION: MRI-guided biopsies of the 2 suspicious enhancing masses within the left breast, upper inner quadrant and upper outer quadrant.  Pt had biopsy done of 2 areas in left breast on 05/01/2017 with  pathology returning as Diagnosis 1. Breast, left, needle core biopsy, UOQ - FIBROADENOMA. - FIBROCYSTIC CHANGE. - NO MALIGNANCY IDENTIFIED. 2. Breast, left, needle core biopsy, UIQ - FIBROCYSTIC CHANGE. - NO MALIGNANCY IDENTIFIED.  Bilateral diagnostic mammogram done 03/24/2018 reviewed and was negative.  She is recommended for repeat imaging in 03/2019.  She has been seen by her surgeon Dr. Brantley Stage regarding breast pain and will continue to follow-up with his office.  Pt will RTC 6 weeks after kidney surgery to go over final pathology.  With discuss case with genetic counseling for additional for evaluation due pt concerns about VHL.   2.  Left breast fibroadenoma.  I have discussed with her these are considered benign lesions.  Reviewed bilateral diagnostic mammogram that was done 03/24/2018 and was negative. She has been seen by Dr. Brantley Stage and will continue to follow-up with his office.    3.  Renal lesion.  She was seen in ER for evaluation after a fall.  She had CT CAP done 01/18/2018 that showed IMPRESSION: 1. No evidence of acute traumatic injury in the chest, abdomen or pelvis. 2. Indeterminate 2.0 cm hypodense renal cortical mass in the posterior interpolar left kidney, renal neoplasm not excluded. Recommend short-term outpatient characterization with MRI abdomen without and with IV contrast. This recommendation follows ACR consensus guidelines: Management of the Incidental Renal Mass on CT: A White Paper of the ACR Incidental Findings Committee. J Am Coll Radiol 2018;15:264-273. 3. Dilated main pulmonary artery, suggesting pulmonary arterial Hypertension.  She has been seen by [facility] and reports she is scheduled for surgery on 06/19/2018.  She will follow-up 4-6 weeks after surgery to go over final path.  Will discuss case with genetic counselor as pt has concerns about VHL  4.  Hypokalemia.  Labs done 05/29/2018 reviewed and shows K+ 3.6 with Cr of 0.75.  She reports she  was diagnosed with C Diff that was believed to have contributed to electrolyte abnormalities.  She is off BP meds.  Follow-up with GI and PCP as directed for ongoing monitoring.    5.  C Diff.  This was diagnosed while hospitalized.  Follow-up with GI as recommended.    6.  MGUS.  SPEP done 05/23/2018 was negative.  Will repeat SPEP after surgery and if remains negative will discontinue follow-up labs.    Greater than 30 minutes spent with more than 50% spent in counseling and coordination of care.    Current Status:  Pt is seen today for follow-up to go over labs.  She reports she was diagnosed with C Diff while hospitalized.  She was taken off BP meds.  She was on oral potassium.  She has concerns about VHL due to kidney lesion and family history.    Oncology History   Stage 0: Right breast DCIS, high-grade, ER/PR negative     Breast cancer of lower-inner quadrant of right female breast ([facility])   08/19/2015 Breast US    Masslike asymmetry within the lower inner quadrant of the right breast, at posterior depth, with associated microcalcifications which are new.     08/23/2015 Initial Biopsy    AT LEAST HIGH DUCTAL CARCINOMA IN SITU    08/23/2015 Receptors her2    Estrogen Receptor: 0%, NEGATIVE Progesterone Receptor: 0%, NEGATIVE    08/26/2015 Initial Diagnosis    Breast cancer of lower-inner quadrant of right female breast ([place])    08/31/2015 Procedure    [facility] negative.  Genes tested include: ATM, BRCA1, BRCA2, CDH1, CHEK2, PALB2, PTEN, & TP53.     10/13/2015 Pathologic Stage    pTis, pN0: Stage 0     10/13/2015 Surgery    Right lumpectomy & SLNB ([name]). High grade DCIS, 0.4 cm. Negative margins.  1 right axillary SLN neg. ER/PR repeated and both remain negative.     11/17/2015 - 12/21/2015 Radiation Therapy    Treated at [facility]). Right breast: Total dose 50 Gy in 25 fractions. 3-D tangents; 6 MV photons.       Problem List Patient Active Problem List    Diagnosis Date Noted  . Clostridium difficile diarrhea [A04.72]   . Prolonged Q-T interval on ECG [R94.31]   . Weakness [R53.1]   . Hypokalemia, gastrointestinal losses [E87.6] 05/25/2018  . Hypokalemia [E87.6] 05/24/2018  . Dizziness [R42] 10/29/2017  . Gait abnormality [R26.9] 10/29/2017  . Tremor, essential [G25.0] 10/29/2017  . Diplopia [H53.2] 10/29/2017  . Obstructive sleep apnea on CPAP [G47.33, Z99.89] 12/10/2016  . Plantar fasciitis, bilateral [M72.2] 12/10/2016  . Fibromyalgia [M79.7] 11/09/2016  . IBS (irritable bowel syndrome) [K58.9] 11/09/2016  . GERD (gastroesophageal reflux disease) [K21.9] 11/09/2016  . Anxiety and depression [F41.9, F32.9] 11/09/2016  . MGUS (monoclonal gammopathy of unknown significance) [D47.2] 03/15/2016  . Breast cancer of lower-inner quadrant of right female breast ([place]) [C50.311] 08/26/2015  . Headache [R51] 07/17/2015  . Hypertension [I10] 07/17/2015  . Obesity (BMI 35.0-39.9 without comorbidity) [E66.9] 07/17/2015    Past Medical History Past Medical History:  Diagnosis Date  . Allergy    seasonal  . Anxiety   . Arthritis   . Balance problem   . Brain aneurysm    2 mm ACA region aneurysm by 09/21/15 MRA  . Breast cancer ([place])   . Breast cancer of lower-inner quadrant of  right female breast ([place]) 08/26/2015  . Chronic kidney disease    acute renal failure one occasion  . Complication of anesthesia    heart rate drops and O2 sats drop  . Constipation   . Depression   . Diabetes mellitus without complication ([place])    not on medication at this time  . Diplopia 10/29/2017  . Dizziness 10/29/2017  . Elevated liver function tests   . Family history of adverse reaction to anesthesia    mom has n/v  . Family history of breast cancer   . Family history of colon cancer   . Fibromyalgia   . Fibromyalgia   . Gait abnormality 10/29/2017  . Genetic testing 09/08/2015   Negative genetic testing on the Breast/High Moderate Risk panel. The  Breast High/Moderate Risk gene panel offered by [facility] includes sequencing and deletion/duplication analysis of the following 9 genes: ATM, BRCA1, BRCA2, CDH1, CHEK2, PALB2, PTEN, STK11, and TP53. The report date is September 08, 2015.  The Rest of the Comprehensive Cancer panel has been reflexed and will be available in 2-3 weeks.  POLD1 c.208G>T VUS found on the Remainder of the Comprehensive cancer panel.  The Comprehensive Cancer Panel offered by [facility] includes sequencing and/or deletion duplication testing of the following 32 genes: APC, ATM, AXIN2, BARD1, BMPR1A, BRCA1, BRCA2, BRIP1, CDH1, CDK4, CDKN2A, CHEK2, EPCAM, FANCC, MLH1, MSH2, MSH6, MUTYH, NBN, PALB2, PMS2, POLD1, POLE, PTEN, RAD51C, RAD51D, SCG5/GREM1, SMAD4, STK11, TP53, VHL, and XRCC2.   The report date is 09/12/2015.   Marland Kitchen GERD (gastroesophageal reflux disease)    none recent  . H/O acute renal failure 09/22/15   admitted to [facility]  . Headache    occipital and temporal  . History of hiatal hernia   . Hyperlipemia   . Hyperlipidemia   . Hypertension   . Irritable bowel syndrome (IBS)   . MGUS (monoclonal gammopathy of unknown significance) 03/15/2016  . Neuromuscular disorder (HCC)    fibromyalgia  . Occasional tremors   . OSA (obstructive sleep apnea)    uses cpap with humidification  . Personal history of radiation therapy   . PONV (postoperative nausea and vomiting)   . Renal cell carcinoma ([place])   . Tremor, essential 10/29/2017    Past Surgical History Past Surgical History:  Procedure Laterality Date  . ABDOMINAL HYSTERECTOMY     adenomyosis  . ABDOMINAL HYSTERECTOMY    . BLADDER SURGERY    . BONE GRAFT HIP ILIAC CREST    . BREAST BIOPSY Bilateral   . BREAST LUMPECTOMY    . BREAST SURGERY    . CHOLECYSTECTOMY    . FRACTURE SURGERY Right    wrist  . KNEE ARTHROSCOPY    . KNEE ARTHROSCOPY W/ ACL RECONSTRUCTION     right  . RADIOACTIVE SEED GUIDED PARTIAL MASTECTOMY WITH AXILLARY SENTINEL LYMPH NODE  BIOPSY Right 10/13/2015   Procedure: RADIOACTIVE SEED GUIDED PARTIAL MASTECTOMY WITH AXILLARY SENTINEL LYMPH NODE BIOPSY;  Surgeon: Erroll Luna, MD;  Location: [facility];  Service: General;  Laterality: Right;  . WRIST SURGERY      Family History Family History  Problem Relation Age of Onset  . Hypertension Mother   . Autoimmune disease Mother        lichen planus  . Eczema Mother   . Parkinson's disease Mother   . Mental illness Mother        bipolar  . Heart disease Maternal Aunt   . Lung cancer Paternal Aunt   . Breast  cancer Paternal Aunt        dx in her 74s  . Lung cancer Paternal Grandfather   . Colon cancer Paternal Uncle        dx in her 68s  . Colon cancer Paternal Uncle        dx in his 48s  . Melanoma Father 76  . Psoriasis Father   . Hypertension Father   . Heart disease Father   . Breast cancer Sister        dx in her 34s  . Lupus Sister   . COPD Maternal Uncle   . Colon cancer Paternal Uncle        dx in his 48s  . Mental illness Son        bipolar     Social History  reports that she has never smoked. She has never used smokeless tobacco. She reports that she does not drink alcohol or use drugs.  Medications  Current Outpatient Medications:  .  calcium carbonate (TUMS - DOSED IN MG ELEMENTAL CALCIUM) 500 MG chewable tablet, Chew 1 tablet (200 mg of elemental calcium total) by mouth 2 (two) times daily with breakfast and lunch., Disp: , Rfl:  .  clonazePAM (KLONOPIN) 1 MG tablet, Take 1 mg by mouth 3 (three) times daily. , Disp: , Rfl:  .  FLUoxetine HCl 60 MG TABS, Take 60 mg by mouth daily. , Disp: , Rfl:  .  OVER THE COUNTER MEDICATION, Take 1 capsule by mouth daily as needed. Diphenhydramate Over The Counter Anti-emetic, Disp: , Rfl:  .  potassium chloride SA (K-DUR,KLOR-CON) 20 MEQ tablet, Take 1 tablet (20 mEq total) by mouth daily for 5 days., Disp: 5 tablet, Rfl: 0 .  pravastatin (PRAVACHOL) 10 MG tablet, Take 10 mg by mouth daily., Disp: , Rfl:   .  saccharomyces boulardii (FLORASTOR) 250 MG capsule, Take 1 capsule (250 mg total) by mouth 2 (two) times daily., Disp: 60 capsule, Rfl: 0 .  topiramate (TOPAMAX) 25 MG tablet, Take 1 tablet (25 mg total) by mouth See admin instructions. Take one in the morning and 2 in the evening, Disp: , Rfl:  .  traMADol (ULTRAM) 50 MG tablet, Take 1 tablet by mouth daily as needed., Disp: , Rfl: 0 .  vancomycin (VANCOCIN) 125 MG capsule, Take 1 capsule (125 mg total) by mouth 4 (four) times daily for 12 days., Disp: 48 capsule, Rfl: 0 .  Vitamin D, Ergocalciferol, (DRISDOL) 50000 units CAPS capsule, Take 50,000 Units by mouth every 7 (seven) days. Take 1 tablet (50,000 units) by mouth every Thursday, Disp: , Rfl:   Allergies Ramipril; Shrimp [shellfish allergy]; Minocycline hcl; Altace [ramipril]; Lyrica [pregabalin]; Adhesive [tape]; Bactrim [sulfamethoxazole-trimethoprim]; Drixoral [brompheniramine-pseudoeph]; Erythromycin; Minocin [minocycline hcl]; Septra [sulfamethoxazole-trimethoprim]; and Sinutab [chlorphen-pseudoephed-apap]  Review of Systems Review of Systems - Oncology ROS negative other than fatigue, heart problems   Physical Exam  Vitals Wt Readings from Last 3 Encounters:  06/06/18 228 lb 11.2 oz (103.7 kg)  05/24/18 227 lb 8.2 oz (103.2 kg)  04/09/18 234 lb 8 oz (106.4 kg)   Temp Readings from Last 3 Encounters:  06/06/18 98.3 F (36.8 C) (Oral)  05/29/18 98 F (36.7 C) (Oral)  12/10/16 97.8 F (36.6 C) (Temporal)   BP Readings from Last 3 Encounters:  06/06/18 130/80  05/29/18 (!) 124/93  04/23/18 121/80   Pulse Readings from Last 3 Encounters:  06/06/18 62  05/29/18 (!) 54  04/23/18 60    Constitutional: Well-developed, well-nourished, and  in no distress.   HENT: Head: Normocephalic and atraumatic.  Mouth/Throat: No oropharyngeal exudate. Mucosa moist. Eyes: Pupils are equal, round, and reactive to light. Conjunctivae are normal. No scleral icterus.  Neck: Normal  range of motion. Neck supple. No JVD present.  Cardiovascular: Normal rate, regular rhythm and normal heart sounds.  Exam reveals no gallop and no friction rub.   No murmur heard. Pulmonary/Chest: Effort normal and breath sounds normal. No respiratory distress. No wheezes.No rales.  Abdominal: Soft. Bowel sounds are normal. No distension. There is no tenderness. There is no guarding.  Musculoskeletal: No edema or tenderness.  Lymphadenopathy: No cervical, axillary or supraclavicular adenopathy.  Neurological: Alert and oriented to person, place, and time. No cranial nerve deficit.  Skin: Skin is warm and dry. No rash noted. No erythema. No pallor.  Psychiatric: Affect and judgment normal.  Breast exam:  Chaperone present.  Right breast lumpectomy changes noted.  No dominant masses palpable bilaterally.    Labs No visits with results within 3 Day(s) from this visit.  Latest known visit with results is:  Admission on 05/24/2018, Discharged on 05/29/2018  Component Date Value Ref Range Status  . Sodium 05/24/2018 137  135 - 145 mmol/L Final  . Potassium 05/24/2018 <2.0* 3.5 - 5.1 mmol/L Final   Comment: CRITICAL RESULT CALLED TO, READ BACK BY AND VERIFIED WITH: CAMP C. @ 1343 ON [id number] BY HENDERSONL.   Marland Kitchen Chloride 05/24/2018 94* 98 - 111 mmol/L Final  . CO2 05/24/2018 32  22 - 32 mmol/L Final  . Glucose, Bld 05/24/2018 116* 70 - 99 mg/dL Final  . BUN 05/24/2018 14  6 - 20 mg/dL Final  . Creatinine, Ser 05/24/2018 0.86  0.44 - 1.00 mg/dL Final  . Calcium 05/24/2018 9.3  8.9 - 10.3 mg/dL Final  . Total Protein 05/24/2018 7.9  6.5 - 8.1 g/dL Final  . Albumin 05/24/2018 4.1  3.5 - 5.0 g/dL Final  . AST 05/24/2018 36  15 - 41 U/L Final  . ALT 05/24/2018 50* 0 - 44 U/L Final  . Alkaline Phosphatase 05/24/2018 65  38 - 126 U/L Final  . Total Bilirubin 05/24/2018 0.6  0.3 - 1.2 mg/dL Final  . GFR calc non Af Amer 05/24/2018 >60  >60 mL/min Final  . GFR calc Af Amer 05/24/2018 >60  >60 mL/min  Final   Comment: (NOTE) The eGFR has been calculated using the CKD EPI equation. This calculation has not been validated in all clinical situations. eGFR's persistently <60 mL/min signify possible Chronic Kidney Disease.   Georgiann Hahn gap 05/24/2018 11  5 - 15 Final   Performed at [facility], [address], [place], [place] [address]  . WBC 05/24/2018 7.1  4.0 - 10.5 K/uL Final  . RBC 05/24/2018 4.73  3.87 - 5.11 MIL/uL Final  . Hemoglobin 05/24/2018 15.4* 12.0 - 15.0 g/dL Final  . HCT 05/24/2018 42.3  36.0 - 46.0 % Final  . MCV 05/24/2018 89.4  78.0 - 100.0 fL Final  . MCH 05/24/2018 32.6  26.0 - 34.0 pg Final  . MCHC 05/24/2018 36.4* 30.0 - 36.0 g/dL Final  . RDW 05/24/2018 12.8  11.5 - 15.5 % Final  . Platelets 05/24/2018 235  150 - 400 K/uL Final  . Neutrophils Relative % 05/24/2018 58  % Final  . Neutro Abs 05/24/2018 4.1  1.7 - 7.7 K/uL Final  . Lymphocytes Relative 05/24/2018 32  % Final  . Lymphs Abs 05/24/2018 2.2  0.7 - 4.0 K/uL Final  .  Monocytes Relative 05/24/2018 9  % Final  . Monocytes Absolute 05/24/2018 0.6  0.1 - 1.0 K/uL Final  . Eosinophils Relative 05/24/2018 1  % Final  . Eosinophils Absolute 05/24/2018 0.1  0.0 - 0.7 K/uL Final  . Basophils Relative 05/24/2018 0  % Final  . Basophils Absolute 05/24/2018 0.0  0.0 - 0.1 K/uL Final   Performed at [facility], [address], [place], [place] [address]  . Color, Urine 05/24/2018 YELLOW  YELLOW Final  . APPearance 05/24/2018 CLEAR  CLEAR Final  . Specific Gravity, Urine 05/24/2018 1.008  1.005 - 1.030 Final  . pH 05/24/2018 8.0  5.0 - 8.0 Final  . Glucose, UA 05/24/2018 NEGATIVE  NEGATIVE mg/dL Final  . Hgb urine dipstick 05/24/2018 NEGATIVE  NEGATIVE Final  . Bilirubin Urine 05/24/2018 NEGATIVE  NEGATIVE Final  . Ketones, ur 05/24/2018 NEGATIVE  NEGATIVE mg/dL Final  . Protein, ur 05/24/2018 NEGATIVE  NEGATIVE mg/dL Final  . Nitrite 05/24/2018 NEGATIVE  NEGATIVE Final  . Leukocytes, UA 05/24/2018 MODERATE*  NEGATIVE Final  . RBC / HPF 05/24/2018 0-5  0 - 5 RBC/hpf Final  . WBC, UA 05/24/2018 0-5  0 - 5 WBC/hpf Final  . Bacteria, UA 05/24/2018 FEW* NONE SEEN Final  . Squamous Epithelial / LPF 05/24/2018 6-10  0 - 5 Final   Performed at [facility], [address] [place], [place] [address]  . Magnesium 05/24/2018 2.3  1.7 - 2.4 mg/dL Final   Performed at [facility], [address] [place], [place] [address]  . TSH 05/25/2018 0.946  0.350 - 4.500 uIU/mL Final   Comment: Performed by a 3rd Generation assay with a functional sensitivity of <=0.01 uIU/mL. Performed at [facility], [address], [place], [place] [address]   . Sodium 05/25/2018 140  135 - 145 mmol/L Final  . Potassium 05/25/2018 2.9* 3.5 - 5.1 mmol/L Final   DELTA CHECK NOTED  . Chloride 05/25/2018 105  98 - 111 mmol/L Final  . CO2 05/25/2018 28  22 - 32 mmol/L Final  . Glucose, Bld 05/25/2018 121* 70 - 99 mg/dL Final  . BUN 05/25/2018 11  6 - 20 mg/dL Final  . Creatinine, Ser 05/25/2018 0.81  0.44 - 1.00 mg/dL Final  . Calcium 05/25/2018 8.7* 8.9 - 10.3 mg/dL Final  . GFR calc non Af Amer 05/25/2018 >60  >60 mL/min Final  . GFR calc Af Amer 05/25/2018 >60  >60 mL/min Final   Comment: (NOTE) The eGFR has been calculated using the CKD EPI equation. This calculation has not been validated in all clinical situations. eGFR's persistently <60 mL/min signify possible Chronic Kidney Disease.   Georgiann Hahn gap 05/25/2018 7  5 - 15 Final   Performed at [facility], [address], [place], [place] [address]  . Magnesium 05/25/2018 2.2  1.7 - 2.4 mg/dL Final   Performed at [facility], [address], [place], [place] [address]  . Hgb A1c MFr Bld 05/25/2018 5.9* 4.8 - 5.6 % Final   Comment: (NOTE) Pre diabetes:          5.7%-6.4% Diabetes:              >6.4% Glycemic control for   <7.0% adults with diabetes   . Mean Plasma Glucose 05/25/2018 122.63  mg/dL Final   Performed at [facility] [address]  [place], [place] [address]  . C Diff antigen 05/26/2018 POSITIVE* NEGATIVE Final  . C Diff toxin 05/26/2018 NEGATIVE  NEGATIVE Final  .  C Diff interpretation 05/26/2018 Results are indeterminate. See PCR results.   Final   Performed at [facility], [address], [place], [place] [address]  . Giardia Ag, Stl 05/26/2018 Negative  Negative Final  . Cryptosporidium EIA 05/26/2018 Negative  Negative Final   Comment: (NOTE) Performed At: [facility] [place], [place] [id number] Rush Farmer MD [id number]   . Source of Sample 05/26/2018 STOOL   Final   Performed at [facility], [address], [place], [place] [address]  . Sodium 05/26/2018 141  135 - 145 mmol/L Final  . Potassium 05/26/2018 2.9* 3.5 - 5.1 mmol/L Final  . Chloride 05/26/2018 107  98 - 111 mmol/L Final  . CO2 05/26/2018 26  22 - 32 mmol/L Final  . Glucose, Bld 05/26/2018 111* 70 - 99 mg/dL Final  . BUN 05/26/2018 10  6 - 20 mg/dL Final  . Creatinine, Ser 05/26/2018 0.76  0.44 - 1.00 mg/dL Final  . Calcium 05/26/2018 8.7* 8.9 - 10.3 mg/dL Final  . Total Protein 05/26/2018 6.5  6.5 - 8.1 g/dL Final  . Albumin 05/26/2018 3.3* 3.5 - 5.0 g/dL Final  . AST 05/26/2018 26  15 - 41 U/L Final  . ALT 05/26/2018 36  0 - 44 U/L Final  . Alkaline Phosphatase 05/26/2018 51  38 - 126 U/L Final  . Total Bilirubin 05/26/2018 0.5  0.3 - 1.2 mg/dL Final  . GFR calc non Af Amer 05/26/2018 >60  >60 mL/min Final  . GFR calc Af Amer 05/26/2018 >60  >60 mL/min Final   Comment: (NOTE) The eGFR has been calculated using the CKD EPI equation. This calculation has not been validated in all clinical situations. eGFR's persistently <60 mL/min signify possible Chronic Kidney Disease.   Georgiann Hahn gap 05/26/2018 8  5 - 15 Final   Performed at [facility], [address] [place], [place] [address]  . Magnesium 05/26/2018 1.9  1.7 - 2.4 mg/dL Final   Performed at [facility], [address], [place], [place] [address]  . WBC  05/26/2018 7.2  4.0 - 10.5 K/uL Final  . RBC 05/26/2018 4.42  3.87 - 5.11 MIL/uL Final  . Hemoglobin 05/26/2018 14.0  12.0 - 15.0 g/dL Final  . HCT 05/26/2018 41.2  36.0 - 46.0 % Final  . MCV 05/26/2018 93.2  78.0 - 100.0 fL Final  . MCH 05/26/2018 31.7  26.0 - 34.0 pg Final  . MCHC 05/26/2018 34.0  30.0 - 36.0 g/dL Final  . RDW 05/26/2018 13.5  11.5 - 15.5 % Final  . Platelets 05/26/2018 163  150 - 400 K/uL Final   Performed at [facility], [address], [place], [place] [address]  . Hgb A1c MFr Bld 05/26/2018 5.9* 4.8 - 5.6 % Final   Comment: (NOTE) Pre diabetes:          5.7%-6.4% Diabetes:              >6.4% Glycemic control for   <7.0% adults with diabetes   . Mean Plasma Glucose 05/26/2018 122.63  mg/dL Final   Performed at [facility] [address] [place], [place] [address]  . Cortisol - AM 05/26/2018 18.5  6.7 - 22.6 ug/dL Final   Performed at [facility], [place] [address] [place], [place] [address]  . Toxigenic C. Difficile by PCR 05/26/2018 POSITIVE* NEGATIVE Final   Comment: Positive for toxigenic C. difficile with little to no toxin production. Only treat if clinical presentation  suggests symptomatic illness. Performed at [facility], [place] [address], [place], [place] [address]   . Campylobacter species 05/27/2018 NOT DETECTED  NOT DETECTED Final  . Plesimonas shigelloides 05/27/2018 NOT DETECTED  NOT DETECTED Final  . Salmonella species 05/27/2018 NOT DETECTED  NOT DETECTED Final  . Yersinia enterocolitica 05/27/2018 NOT DETECTED  NOT DETECTED Final  . Vibrio species 05/27/2018 NOT DETECTED  NOT DETECTED Final  . Vibrio cholerae 05/27/2018 NOT DETECTED  NOT DETECTED Final  . Enteroaggregative E coli (EAEC) 05/27/2018 NOT DETECTED  NOT DETECTED Final  . Enteropathogenic E coli (EPEC) 05/27/2018 NOT DETECTED  NOT DETECTED Final  . Enterotoxigenic E coli (ETEC) 05/27/2018 NOT DETECTED  NOT DETECTED Final  . Shiga like toxin producing E coli *  05/27/2018 NOT DETECTED  NOT DETECTED Final  . Shigella/Enteroinvasive E coli (EI* 05/27/2018 NOT DETECTED  NOT DETECTED Final  . Cryptosporidium 05/27/2018 NOT DETECTED  NOT DETECTED Final  . Cyclospora cayetanensis 05/27/2018 NOT DETECTED  NOT DETECTED Final  . Entamoeba histolytica 05/27/2018 NOT DETECTED  NOT DETECTED Final  . Giardia lamblia 05/27/2018 NOT DETECTED  NOT DETECTED Final  . Adenovirus F40/41 05/27/2018 NOT DETECTED  NOT DETECTED Final  . Astrovirus 05/27/2018 NOT DETECTED  NOT DETECTED Final  . Norovirus GI/GII 05/27/2018 NOT DETECTED  NOT DETECTED Final  . Rotavirus A 05/27/2018 NOT DETECTED  NOT DETECTED Final  . Sapovirus (I, II, IV, and V) 05/27/2018 NOT DETECTED  NOT DETECTED Final   Performed at [facility], [address], [place], [place] [address]  . Magnesium 05/27/2018 2.0  1.7 - 2.4 mg/dL Final   Performed at [facility], [address], [place], [place] [address]  . Sodium 05/27/2018 140  135 - 145 mmol/L Final  . Potassium 05/27/2018 3.1* 3.5 - 5.1 mmol/L Final  . Chloride 05/27/2018 110  98 - 111 mmol/L Final  . CO2 05/27/2018 24  22 - 32 mmol/L Final  . Glucose, Bld 05/27/2018 113* 70 - 99 mg/dL Final  . BUN 05/27/2018 9  6 - 20 mg/dL Final  . Creatinine, Ser 05/27/2018 0.74  0.44 - 1.00 mg/dL Final  . Calcium 05/27/2018 8.5* 8.9 - 10.3 mg/dL Final  . GFR calc non Af Amer 05/27/2018 >60  >60 mL/min Final  . GFR calc Af Amer 05/27/2018 >60  >60 mL/min Final   Comment: (NOTE) The eGFR has been calculated using the CKD EPI equation. This calculation has not been validated in all clinical situations. eGFR's persistently <60 mL/min signify possible Chronic Kidney Disease.   Georgiann Hahn gap 05/27/2018 6  5 - 15 Final   Performed at [facility], [address], [place], [place] [address]  . Sodium 05/28/2018 141  135 - 145 mmol/L Final  . Potassium 05/28/2018 3.3* 3.5 - 5.1 mmol/L Final  . Chloride 05/28/2018 111  98 - 111 mmol/L Final  . CO2  05/28/2018 25  22 - 32 mmol/L Final  . Glucose, Bld 05/28/2018 100* 70 - 99 mg/dL Final  . BUN 05/28/2018 11  6 - 20 mg/dL Final  . Creatinine, Ser 05/28/2018 0.77  0.44 - 1.00 mg/dL Final  . Calcium 05/28/2018 8.5* 8.9 - 10.3 mg/dL Final  . GFR calc non Af Amer 05/28/2018 >60  >60 mL/min Final  . GFR calc Af Amer 05/28/2018 >60  >60 mL/min Final   Comment: (NOTE) The eGFR has been calculated using the CKD EPI equation. This calculation has not been validated in all clinical situations. eGFR's persistently <  60 mL/min signify possible Chronic Kidney Disease.   Georgiann Hahn gap 05/28/2018 5  5 - 15 Final   Performed at [facility], [address], [place], [place] [address]  . Magnesium 05/28/2018 1.9  1.7 - 2.4 mg/dL Final   Performed at [facility], [address] [place], [place] [address]  . Sodium 05/29/2018 139  135 - 145 mmol/L Final  . Potassium 05/29/2018 3.6  3.5 - 5.1 mmol/L Final  . Chloride 05/29/2018 112* 98 - 111 mmol/L Final  . CO2 05/29/2018 23  22 - 32 mmol/L Final  . Glucose, Bld 05/29/2018 109* 70 - 99 mg/dL Final  . BUN 05/29/2018 12  6 - 20 mg/dL Final  . Creatinine, Ser 05/29/2018 0.75  0.44 - 1.00 mg/dL Final  . Calcium 05/29/2018 8.5* 8.9 - 10.3 mg/dL Final  . GFR calc non Af Amer 05/29/2018 >60  >60 mL/min Final  . GFR calc Af Amer 05/29/2018 >60  >60 mL/min Final   Comment: (NOTE) The eGFR has been calculated using the CKD EPI equation. This calculation has not been validated in all clinical situations. eGFR's persistently <60 mL/min signify possible Chronic Kidney Disease.   Georgiann Hahn gap 05/29/2018 4* 5 - 15 Final   Performed at [facility], [address] [place], [place] [address]     Pathology Orders Placed This Encounter  Procedures  . CBC with Differential/Platelet    Standing Status:   Future    Standing Expiration Date:   06/07/2019  . Comprehensive metabolic panel    Standing Status:   Future    Standing Expiration Date:   06/07/2019  .  Lactate dehydrogenase    Standing Status:   Future    Standing Expiration Date:   06/07/2019  . Protein electrophoresis, serum    Standing Status:   Future    Standing Expiration Date:   06/07/2019  . Ferritin    Standing Status:   Future    Standing Expiration Date:   06/07/2019  . IgG, IgA, IgM    Standing Status:   Future    Standing Expiration Date:   06/07/2019  . Kappa/lambda light chains    Standing Status:   Future    Standing Expiration Date:   06/07/2019  . Beta 2 microglobulin, serum    Standing Status:   Future    Standing Expiration Date:   06/07/2019       Zoila Shutter MD

## 2018-06-10 DIAGNOSIS — E559 Vitamin D deficiency, unspecified: Secondary | ICD-10-CM | POA: Diagnosis not present

## 2018-06-10 DIAGNOSIS — K529 Noninfective gastroenteritis and colitis, unspecified: Secondary | ICD-10-CM | POA: Diagnosis not present

## 2018-06-10 DIAGNOSIS — C649 Malignant neoplasm of unspecified kidney, except renal pelvis: Secondary | ICD-10-CM | POA: Diagnosis not present

## 2018-06-10 DIAGNOSIS — I10 Essential (primary) hypertension: Secondary | ICD-10-CM | POA: Diagnosis not present

## 2018-06-10 DIAGNOSIS — M545 Low back pain: Secondary | ICD-10-CM | POA: Diagnosis not present

## 2018-06-10 DIAGNOSIS — R1032 Left lower quadrant pain: Secondary | ICD-10-CM | POA: Diagnosis not present

## 2018-06-10 NOTE — Patient Instructions (Signed)
Sarah Cooley  06/10/2018   Your procedure is scheduled on: 06/19/2018   Report to [facility] Main  Entrance  Report to admitting at   [facility] AM    Call this number if you have problems the morning of surgery [phone]   Remember: Do not eat food or drink liquids :After Midnight.     Take these medicines the morning of surgery with A SIP OF WATER:Clonazepam, Topamax                                 You may not have any metal on your body including hair pins and              piercings  Do not wear jewelry, make-up, lotions, powders or perfumes, deodorant             Do not wear nail polish.  Do not shave  48 hours prior to surgery.                 Do not bring valuables to the hospital. [place].  Contacts, dentures or bridgework may not be worn into surgery.  Leave suitcase in the car. After surgery it may be brought to your room.                     Please read over the following fact sheets you were given: _____________________________________________________________________             [facility] - Preparing for Surgery Before surgery, you can play an important role.  Because skin is not sterile, your skin needs to be as free of germs as possible.  You can reduce the number of germs on your skin by washing with CHG (chlorahexidine gluconate) soap before surgery.  CHG is an antiseptic cleaner which kills germs and bonds with the skin to continue killing germs even after washing. Please DO NOT use if you have an allergy to CHG or antibacterial soaps.  If your skin becomes reddened/irritated stop using the CHG and inform your nurse when you arrive at Short Stay. Do not shave (including legs and underarms) for at least 48 hours prior to the first CHG shower.  You may shave your face/neck. Please follow these instructions carefully:  1.  Shower with CHG Soap the night before surgery and the  morning of  Surgery.  2.  If you choose to wash your hair, wash your hair first as usual with your  normal  shampoo.  3.  After you shampoo, rinse your hair and body thoroughly to remove the  shampoo.                           4.  Use CHG as you would any other liquid soap.  You can apply chg directly  to the skin and wash                       Gently with a scrungie or clean washcloth.  5.  Apply the CHG Soap to your body ONLY FROM THE NECK DOWN.   Do not use on face/ open  Wound or open sores. Avoid contact with eyes, ears mouth and genitals (private parts).                       Wash face,  Genitals (private parts) with your normal soap.             6.  Wash thoroughly, paying special attention to the area where your surgery  will be performed.  7.  Thoroughly rinse your body with warm water from the neck down.  8.  DO NOT shower/wash with your normal soap after using and rinsing off  the CHG Soap.                9.  Pat yourself dry with a clean towel.            10.  Wear clean pajamas.            11.  Place clean sheets on your bed the night of your first shower and do not  sleep with pets. Day of Surgery : Do not apply any lotions/deodorants the morning of surgery.  Please wear clean clothes to the hospital/surgery center.  FAILURE TO FOLLOW THESE INSTRUCTIONS MAY RESULT IN THE CANCELLATION OF YOUR SURGERY PATIENT SIGNATURE_________________________________  NURSE SIGNATURE__________________________________  ________________________________________________________________________  WHAT IS A BLOOD TRANSFUSION? Blood Transfusion Information  A transfusion is the replacement of blood or some of its parts. Blood is made up of multiple cells which provide different functions.  Red blood cells carry oxygen and are used for blood loss replacement.  White blood cells fight against infection.  Platelets control bleeding.  Plasma helps clot blood.  Other blood products are  available for specialized needs, such as hemophilia or other clotting disorders. BEFORE THE TRANSFUSION  Who gives blood for transfusions?   Healthy volunteers who are fully evaluated to make sure their blood is safe. This is blood bank blood. Transfusion therapy is the safest it has ever been in the practice of medicine. Before blood is taken from a donor, a complete history is taken to make sure that person has no history of diseases nor engages in risky social behavior (examples are intravenous drug use or sexual activity with multiple partners). The donor's travel history is screened to minimize risk of transmitting infections, such as malaria. The donated blood is tested for signs of infectious diseases, such as HIV and hepatitis. The blood is then tested to be sure it is compatible with you in order to minimize the chance of a transfusion reaction. If you or a relative donates blood, this is often done in anticipation of surgery and is not appropriate for emergency situations. It takes many days to process the donated blood. RISKS AND COMPLICATIONS Although transfusion therapy is very safe and saves many lives, the main dangers of transfusion include:   Getting an infectious disease.  Developing a transfusion reaction. This is an allergic reaction to something in the blood you were given. Every precaution is taken to prevent this. The decision to have a blood transfusion has been considered carefully by your caregiver before blood is given. Blood is not given unless the benefits outweigh the risks. AFTER THE TRANSFUSION  Right after receiving a blood transfusion, you will usually feel much better and more energetic. This is especially true if your red blood cells have gotten low (anemic). The transfusion raises the level of the red blood cells which carry oxygen, and this usually causes an energy increase.  The  nurse administering the transfusion will monitor you carefully for  complications. HOME CARE INSTRUCTIONS  No special instructions are needed after a transfusion. You may find your energy is better. Speak with your caregiver about any limitations on activity for underlying diseases you may have. SEEK MEDICAL CARE IF:   Your condition is not improving after your transfusion.  You develop redness or irritation at the intravenous (IV) site. SEEK IMMEDIATE MEDICAL CARE IF:  Any of the following symptoms occur over the next 12 hours:  Shaking chills.  You have a temperature by mouth above 102 F (38.9 C), not controlled by medicine.  Chest, back, or muscle pain.  People around you feel you are not acting correctly or are confused.  Shortness of breath or difficulty breathing.  Dizziness and fainting.  You get a rash or develop hives.  You have a decrease in urine output.  Your urine turns a dark color or changes to pink, red, or brown. Any of the following symptoms occur over the next 10 days:  You have a temperature by mouth above 102 F (38.9 C), not controlled by medicine.  Shortness of breath.  Weakness after normal activity.  The white part of the eye turns yellow (jaundice).  You have a decrease in the amount of urine or are urinating less often.  Your urine turns a dark color or changes to pink, red, or brown. Document Released: 10/05/2000 Document Revised: 12/31/2011 Document Reviewed: 05/24/2008 [facility] Patient Information 2014 [place], [place].  _______________________________________________________________________

## 2018-06-11 ENCOUNTER — Ambulatory Visit: Payer: BLUE CROSS/BLUE SHIELD | Admitting: Cardiology

## 2018-06-11 ENCOUNTER — Encounter: Payer: Self-pay | Admitting: Cardiology

## 2018-06-11 ENCOUNTER — Encounter: Payer: Self-pay | Admitting: *Deleted

## 2018-06-11 VITALS — BP 125/86 | HR 66 | Ht 68.0 in | Wt 228.0 lb

## 2018-06-11 DIAGNOSIS — Z01810 Encounter for preprocedural cardiovascular examination: Secondary | ICD-10-CM

## 2018-06-11 DIAGNOSIS — E782 Mixed hyperlipidemia: Secondary | ICD-10-CM | POA: Diagnosis not present

## 2018-06-11 DIAGNOSIS — I10 Essential (primary) hypertension: Secondary | ICD-10-CM | POA: Diagnosis not present

## 2018-06-11 MED ORDER — POTASSIUM CHLORIDE CRYS ER 20 MEQ PO TBCR: EXTENDED_RELEASE_TABLET | ORAL | 3 refills | Status: DC

## 2018-06-11 MED ORDER — FUROSEMIDE 20 MG PO TABS: 20.0000 mg | ORAL_TABLET | Freq: Every day | ORAL | 3 refills | Status: DC | PRN

## 2018-06-11 NOTE — Progress Notes (Addendum)
Clinical Summary Ms. Blancett is a 52 y.o.female I had seen as a hospital consult in 2016, this is our first clinic visit together  1. Preoperative evaluation - being considered for partial nephrectomy - palpitations over last 3 months she attributes to low potassium with her diarrhea. No chest pain.  - sedentary lifestyle due to chronic fatigue and fibromyalgia and chronic medical issues. Highest activityis walking at work. Walks up flight stairs at home few times a week without significant symptoms.    2. HTN - admission 06/2015 with severe HTN, mild trop elevation to 0.19. EKG was nonacute. Lexiscan at the time showed no ischemia, echo LVEF 60-65% with no WMAs.  - issues with low bp's and some bradycardia during admit 05/2018 with cdiff colitis and hypokalemia. Coreg 12.19m bid  and dilt 120, chlorthalidone 25 were stopped.   - restarted on losartan.    3. Hyperlipidemia - compliant with statin   4. Possible renal cell carcinoma - being considered for surgery next week.   5. Hypokalemia - admission 05/2018 with N/V/D, K was 2. Diagnosed with cdiff colitis - K was replaced, chlorthalidone was stopped - in this setting EKG with some long QTc that improved with K replacement.    6. Dilated main pulmonary artery - noted on 12/2017 CT chest - prior echo in 2016 unable to measure PASP, inadequate TR jet. Normal RV.  7. Brain aneurysm - being monitored by neurology.  Past Medical History:  Diagnosis Date  . Allergy    seasonal  . Anxiety   . Arthritis   . Balance problem   . Brain aneurysm    2 mm ACA region aneurysm by 09/21/15 [facility]  . Breast cancer ([place]   . Breast cancer of lower-inner quadrant of right female breast ([place] 08/26/[place]  . Chronic kidney disease    acute renal failure one occasion  . Complication of anesthesia    heart rate drops and O2 sats drop  . Constipation   . Depression   . Diabetes mellitus without complication ([place]    not on medication at  this time  . Diplopia 10/29/2017  . Dizziness 10/29/2017  . Elevated liver function tests   . Family history of adverse reaction to anesthesia    mom has n/v  . Family history of breast cancer   . Family history of colon cancer   . Fibromyalgia   . Fibromyalgia   . Gait abnormality 10/29/2017  . Genetic testing 09/08/2015   Negative genetic testing on the Breast/High Moderate Risk panel. The Breast High/Moderate Risk gene panel offered by [facility] includes sequencing and deletion/duplication analysis of the following 9 genes: ATM, BRCA1, BRCA2, CDH1, CHEK2, PALB2, PTEN, STK11, and TP53. The report date is September 08, 2015.  The Rest of the Comprehensive Cancer panel has been reflexed and will be available in 2-3 weeks.  POLD1 c.208G>T VUS found on the Remainder of the Comprehensive cancer panel.  The Comprehensive Cancer Panel offered by [facility] includes sequencing and/or deletion duplication testing of the following 32 genes: APC, ATM, AXIN2, BARD1, BMPR1A, BRCA1, BRCA2, BRIP1, CDH1, CDK4, CDKN2A, CHEK2, EPCAM, FANCC, MLH1, MSH2, MSH6, MUTYH, NBN, PALB2, PMS2, POLD1, POLE, PTEN, RAD51C, RAD51D, SCG5/GREM1, SMAD4, STK11, TP53, VHL, and XRCC2.   The report date is 09/12/2015.   .Marland KitchenGERD (gastroesophageal reflux disease)    none recent  . H/O acute renal failure 09/22/15   [facility] . Headache    occipital and temporal  .  History of hiatal hernia   . Hyperlipemia   . Hyperlipidemia   . Hypertension   . Irritable bowel syndrome (IBS)   . MGUS (monoclonal gammopathy of unknown significance) 03/15/2016  . Neuromuscular disorder (HCC)    fibromyalgia  . Occasional tremors   . OSA (obstructive sleep apnea)    uses cpap with humidification  . Personal history of radiation therapy   . PONV (postoperative nausea and vomiting)   . Renal cell carcinoma ([place])   . Tremor, essential 10/29/2017     Allergies  Allergen Reactions  . Ramipril Nausea And Vomiting and Other (See Comments)     Ramipril--Caused Pancreatitis  . Shrimp [Shellfish Allergy] Anaphylaxis and Swelling  . Minocycline Hcl Hives  . Altace [Ramipril] Nausea And Vomiting and Nausea Only  . Lyrica [Pregabalin] Nausea And Vomiting  . Adhesive [Tape] Rash    Please use paper tape  . Bactrim [Sulfamethoxazole-Trimethoprim] Other (See Comments)    May have caused kidney issues  . Drixoral [Brompheniramine-Pseudoeph] Rash  . Erythromycin Other (See Comments)    GI- Upset / severe abdominal pain  . Minocin [Minocycline Hcl] Nausea And Vomiting and Rash  . Septra [Sulfamethoxazole-Trimethoprim] Rash  . Sinutab [Chlorphen-Pseudoephed-Apap] Rash     Current Outpatient Medications  Medication Sig Dispense Refill  . calcium carbonate (TUMS - DOSED IN MG ELEMENTAL CALCIUM) 500 MG chewable tablet Chew 1 tablet (200 mg of elemental calcium total) by mouth 2 (two) times daily with breakfast and lunch.    . clonazePAM (KLONOPIN) 1 MG tablet Take 1 mg by mouth 3 (three) times daily.     Marland Kitchen FLUoxetine HCl 60 MG TABS Take 60 mg by mouth daily.     Marland Kitchen OVER THE COUNTER MEDICATION Take 1 capsule by mouth daily as needed. Diphenhydramate Over The Counter Anti-emetic    . potassium chloride SA (K-DUR,KLOR-CON) 20 MEQ tablet Take 1 tablet (20 mEq total) by mouth daily for 5 days. 5 tablet 0  . pravastatin (PRAVACHOL) 10 MG tablet Take 10 mg by mouth daily.    Marland Kitchen saccharomyces boulardii (FLORASTOR) 250 MG capsule Take 1 capsule (250 mg total) by mouth 2 (two) times daily. 60 capsule 0  . topiramate (TOPAMAX) 25 MG tablet Take 1 tablet (25 mg total) by mouth See admin instructions. Take one in the morning and 2 in the evening    . traMADol (ULTRAM) 50 MG tablet Take 1 tablet by mouth daily as needed.  0  . Vitamin D, Ergocalciferol, (DRISDOL) 50000 units CAPS capsule Take 50,000 Units by mouth every 7 (seven) days. Take 1 tablet (50,000 units) by mouth every Thursday     No current facility-administered medications for this visit.        Past Surgical History:  Procedure Laterality Date  . ABDOMINAL HYSTERECTOMY     adenomyosis  . ABDOMINAL HYSTERECTOMY    . BLADDER SURGERY    . BONE GRAFT HIP ILIAC CREST    . BREAST BIOPSY Bilateral   . BREAST LUMPECTOMY    . BREAST SURGERY    . CHOLECYSTECTOMY    . FRACTURE SURGERY Right    wrist  . KNEE ARTHROSCOPY    . KNEE ARTHROSCOPY W/ ACL RECONSTRUCTION     right  . RADIOACTIVE SEED GUIDED PARTIAL MASTECTOMY WITH AXILLARY SENTINEL LYMPH NODE BIOPSY Right 10/13/2015   Procedure: RADIOACTIVE SEED GUIDED PARTIAL MASTECTOMY WITH AXILLARY SENTINEL LYMPH NODE BIOPSY;  Surgeon: Erroll Luna, MD;  Location: [facility];  Service: General;  Laterality: Right;  .  WRIST SURGERY       Allergies  Allergen Reactions  . Ramipril Nausea And Vomiting and Other (See Comments)    Ramipril--Caused Pancreatitis  . Shrimp [Shellfish Allergy] Anaphylaxis and Swelling  . Minocycline Hcl Hives  . Altace [Ramipril] Nausea And Vomiting and Nausea Only  . Lyrica [Pregabalin] Nausea And Vomiting  . Adhesive [Tape] Rash    Please use paper tape  . Bactrim [Sulfamethoxazole-Trimethoprim] Other (See Comments)    May have caused kidney issues  . Drixoral [Brompheniramine-Pseudoeph] Rash  . Erythromycin Other (See Comments)    GI- Upset / severe abdominal pain  . Minocin [Minocycline Hcl] Nausea And Vomiting and Rash  . Septra [Sulfamethoxazole-Trimethoprim] Rash  . Sinutab [Chlorphen-Pseudoephed-Apap] Rash      Family History  Problem Relation Age of Onset  . Hypertension Mother   . Autoimmune disease Mother        lichen planus  . Eczema Mother   . Parkinson's disease Mother   . Mental illness Mother        bipolar  . Heart disease Maternal Aunt   . Lung cancer Paternal Aunt   . Breast cancer Paternal Aunt        dx in her 59s  . Lung cancer Paternal Grandfather   . Colon cancer Paternal Uncle        dx in her 86s  . Colon cancer Paternal Uncle        dx in his 77s  .  Melanoma Father 60  . Psoriasis Father   . Hypertension Father   . Heart disease Father   . Breast cancer Sister        dx in her 51s  . Lupus Sister   . COPD Maternal Uncle   . Colon cancer Paternal Uncle        dx in his 33s  . Mental illness Son        bipolar     Social History Ms. Loncar reports that she has never smoked. She has never used smokeless tobacco. Ms. Vandevender reports that she does not drink alcohol.   Review of Systems CONSTITUTIONAL: No weight loss, fever, chills, weakness or fatigue.  HEENT: Eyes: No visual loss, blurred vision, double vision or yellow sclerae.No hearing loss, sneezing, congestion, runny nose or sore throat.  SKIN: No rash or itching.  CARDIOVASCULAR: per hpi RESPIRATORY: No shortness of breath, cough or sputum.  GASTROINTESTINAL: No anorexia, nausea, vomiting or diarrhea. No abdominal pain or blood.  GENITOURINARY: No burning on urination, no polyuria NEUROLOGICAL: No headache, dizziness, syncope, paralysis, ataxia, numbness or tingling in the extremities. No change in bowel or bladder control.  MUSCULOSKELETAL: No muscle, back pain, joint pain or stiffness.  LYMPHATICS: No enlarged nodes. No history of splenectomy.  PSYCHIATRIC: No history of depression or anxiety.  ENDOCRINOLOGIC: No reports of sweating, cold or heat intolerance. No polyuria or polydipsia.  Marland Kitchen   Physical Examination Vitals:   06/11/18 1329  BP: 125/86  Pulse: 66  SpO2: 97%   Vitals:   06/11/18 1329  Weight: 228 lb (103.4 kg)  Height: 5' 8"  (1.727 m)    Gen: resting comfortably, no acute distress HEENT: no scleral icterus, pupils equal round and reactive, no palptable cervical adenopathy,  CV: RRR, no m/r/g, no jvd Resp: Clear to auscultation bilaterally GI: abdomen is soft, non-tender, non-distended, normal bowel sounds, no hepatosplenomegaly MSK: extremities are warm, no edema.  Skin: warm, no rash Neuro:  no focal deficits Psych: appropriate  affect  Diagnostic Studies  06/2015 echo Study Conclusions  - Left ventricle: The cavity size was normal. Wall thickness was   normal. Systolic function was normal. The estimated ejection   fraction was in the range of 60% to 65%. Wall motion was normal;   there were no regional wall motion abnormalities. Left   ventricular diastolic function parameters were normal. - Aortic valve: Valve area (VTI): 2.67 cm^2. Valve area (Vmax):   3.29 cm^2. - Atrial septum: No defect or patent foramen ovale was identified. - Technically adequate study.  06/2015 nuclear stress test  There was no ST segment deviation noted during stress after Lexiscan injection  The study is normal. There are no perfusion defects consistent with ischemia or prior infarct.  This is a low risk study.  Nuclear stress EF: 63%.   Assessment and Plan  1. Preoperative evaluation - being considered for partial nephrectomy - extensive cardiac testing in 2016 was overall benign - tolerates greater than 4 METs regularly without symptoms - recommend proceeding with surgery as planned  2. HTN - at goal, continue losartan  3. Hyperlipidemia - request labs from pcp, continue statin  4. LE edema - occaional. Start lasix 70m prn, advised to take sparingly given her isses with low potassium. Will give 268m KCl to take on days she takes her lasix      JoArnoldo LenisM.D.

## 2018-06-11 NOTE — Patient Instructions (Signed)
Medication Instructions:  Your physician has recommended you make the following change in your medication:  Lasix 20 mg Daily as needed for swelling    Labwork: NONE   Testing/Procedures: NONE   Follow-Up: Your physician recommends that you schedule a follow-up appointment as needed.    Any Other Special Instructions Will Be Listed Below (If Applicable).     If you need a refill on your cardiac medications before your next appointment, please call your pharmacy.  Thank you for choosing [facility]!

## 2018-06-11 NOTE — Addendum Note (Signed)
Addended by: Debbora Lacrosse R on: 06/11/2018 02:32 PM   Modules accepted: Orders

## 2018-06-12 ENCOUNTER — Encounter (HOSPITAL_COMMUNITY)
Admission: RE | Admit: 2018-06-12 | Discharge: 2018-06-12 | Disposition: A | Discharge status: Home / Self Care | Payer: BLUE CROSS/BLUE SHIELD | Source: Ambulatory Visit | Attending: Urology | Admitting: Urology

## 2018-06-12 ENCOUNTER — Other Ambulatory Visit: Payer: Self-pay

## 2018-06-12 ENCOUNTER — Encounter (HOSPITAL_COMMUNITY): Payer: Self-pay

## 2018-06-12 DIAGNOSIS — Z0183 Encounter for blood typing: Secondary | ICD-10-CM | POA: Diagnosis not present

## 2018-06-12 DIAGNOSIS — N2889 Other specified disorders of kidney and ureter: Secondary | ICD-10-CM | POA: Insufficient documentation

## 2018-06-12 DIAGNOSIS — Z01812 Encounter for preprocedural laboratory examination: Secondary | ICD-10-CM | POA: Insufficient documentation

## 2018-06-12 LAB — ABO/RH: ABO/RH(D): A POS

## 2018-06-12 NOTE — Progress Notes (Addendum)
05/26/2018-epic-ekg CXR-epic  2016-echo-epic  LOV Dr Deretha Emory- epic -06/11/2018-clearance in note.  Ct chest- 01/18/2018 - epic 06/10/2018-CBC/DIFF/CMP- on chart

## 2018-06-12 NOTE — Progress Notes (Signed)
rEQUESTED lov from pcp- Dr Wende Neighbors.

## 2018-06-12 NOTE — Progress Notes (Signed)
PCP note from Dr Wende Neighbors dated 06/10/2018 on chart.  05/25/2018 Office visit note Dr Carlyon Prows [place] [facility] Gertie Fey) on chart  LVMM for Infectious Disease to call me back prior to surgery regarding this patient.  [facility] for Darrel Reach at [facility] with info that patient was positive fro C Diff on 05/26/2018 received treatment with Vancomycin and has been seen by PCP and Gastroenterology post hospital and I have a call out to Infectious Disease to see if any thing further needs to be done prior to surgery.  Left Darrel Reach a message regarding this and will let her know of any further developments.

## 2018-06-13 NOTE — Progress Notes (Signed)
Infection Control called back this am regarding recent CDiff diagnosis on8/02/2018 at hospitalization.  Patient on treatment of Vancomycin x 2 weeks.  No diarrhea currently Per Intection Control patient will need to be on enteric precautions time of admission.  Patient may still be colonized per Inf control.  Patient needs to be 30 days from Cdiff diagnosis.

## 2018-06-17 DIAGNOSIS — G4733 Obstructive sleep apnea (adult) (pediatric): Secondary | ICD-10-CM | POA: Diagnosis not present

## 2018-06-19 ENCOUNTER — Encounter (HOSPITAL_COMMUNITY)
Admission: RE | Disposition: A | Discharge status: Home / Self Care | Payer: Self-pay | Source: Home / Self Care | Attending: Urology

## 2018-06-19 ENCOUNTER — Inpatient Hospital Stay (HOSPITAL_COMMUNITY): Payer: BLUE CROSS/BLUE SHIELD | Admitting: Certified Registered Nurse Anesthetist

## 2018-06-19 ENCOUNTER — Other Ambulatory Visit: Payer: Self-pay

## 2018-06-19 ENCOUNTER — Encounter (HOSPITAL_COMMUNITY): Payer: Self-pay

## 2018-06-19 ENCOUNTER — Ambulatory Visit (HOSPITAL_COMMUNITY): Admit: 2018-06-19 | Payer: BLUE CROSS/BLUE SHIELD | Admitting: Urology

## 2018-06-19 ENCOUNTER — Inpatient Hospital Stay (HOSPITAL_COMMUNITY)
Admission: RE | Admit: 2018-06-19 | Discharge: 2018-06-20 | DRG: 656 | Disposition: A | Discharge status: Home / Self Care | Payer: BLUE CROSS/BLUE SHIELD | Attending: Urology | Admitting: Urology

## 2018-06-19 DIAGNOSIS — Q282 Arteriovenous malformation of cerebral vessels: Secondary | ICD-10-CM

## 2018-06-19 DIAGNOSIS — Z8679 Personal history of other diseases of the circulatory system: Secondary | ICD-10-CM | POA: Diagnosis not present

## 2018-06-19 DIAGNOSIS — Z853 Personal history of malignant neoplasm of breast: Secondary | ICD-10-CM

## 2018-06-19 DIAGNOSIS — Z888 Allergy status to other drugs, medicaments and biological substances status: Secondary | ICD-10-CM | POA: Diagnosis not present

## 2018-06-19 DIAGNOSIS — G4733 Obstructive sleep apnea (adult) (pediatric): Secondary | ICD-10-CM | POA: Diagnosis not present

## 2018-06-19 DIAGNOSIS — Z882 Allergy status to sulfonamides status: Secondary | ICD-10-CM

## 2018-06-19 DIAGNOSIS — Z91013 Allergy to seafood: Secondary | ICD-10-CM

## 2018-06-19 DIAGNOSIS — C642 Malignant neoplasm of left kidney, except renal pelvis: Principal | ICD-10-CM | POA: Diagnosis present

## 2018-06-19 DIAGNOSIS — D472 Monoclonal gammopathy: Secondary | ICD-10-CM | POA: Diagnosis not present

## 2018-06-19 DIAGNOSIS — Z803 Family history of malignant neoplasm of breast: Secondary | ICD-10-CM

## 2018-06-19 DIAGNOSIS — Z79899 Other long term (current) drug therapy: Secondary | ICD-10-CM | POA: Diagnosis not present

## 2018-06-19 DIAGNOSIS — D4102 Neoplasm of uncertain behavior of left kidney: Secondary | ICD-10-CM | POA: Diagnosis not present

## 2018-06-19 DIAGNOSIS — Z883 Allergy status to other anti-infective agents status: Secondary | ICD-10-CM

## 2018-06-19 DIAGNOSIS — Z79891 Long term (current) use of opiate analgesic: Secondary | ICD-10-CM

## 2018-06-19 DIAGNOSIS — N2889 Other specified disorders of kidney and ureter: Secondary | ICD-10-CM | POA: Diagnosis not present

## 2018-06-19 DIAGNOSIS — I10 Essential (primary) hypertension: Secondary | ICD-10-CM | POA: Diagnosis not present

## 2018-06-19 DIAGNOSIS — F418 Other specified anxiety disorders: Secondary | ICD-10-CM | POA: Diagnosis not present

## 2018-06-19 HISTORY — PX: ROBOTIC ASSITED PARTIAL NEPHRECTOMY: SHX6087

## 2018-06-19 LAB — BASIC METABOLIC PANEL
ANION GAP: 12 (ref 5–15)
BUN: 9 mg/dL (ref 6–20)
CHLORIDE: 108 mmol/L (ref 98–111)
CO2: 22 mmol/L (ref 22–32)
Calcium: 8.5 mg/dL — ABNORMAL LOW (ref 8.9–10.3)
Creatinine, Ser: 1.02 mg/dL — ABNORMAL HIGH (ref 0.44–1.00)
GFR calc non Af Amer: >60 mL/min (ref >60)
Glucose, Bld: 158 mg/dL — ABNORMAL HIGH (ref 70–99)
Potassium: 3.5 mmol/L (ref 3.5–5.1)
Sodium: 142 mmol/L (ref 135–145)

## 2018-06-19 LAB — CBC
HEMATOCRIT: 40.1 % (ref 36.0–46.0)
HEMOGLOBIN: 13.9 g/dL (ref 12.0–15.0)
MCH: 32.2 pg (ref 26.0–34.0)
MCHC: 34.7 g/dL (ref 30.0–36.0)
MCV: 92.8 fL (ref 78.0–100.0)
Platelets: 229 10*3/uL (ref 150–400)
RBC: 4.32 MIL/uL (ref 3.87–5.11)
RDW: 13.3 % (ref 11.5–15.5)
WBC: 19.1 10*3/uL — AB (ref 4.0–10.5)

## 2018-06-19 LAB — TYPE AND SCREEN
ABO/RH(D): A POS
ANTIBODY SCREEN: NEGATIVE

## 2018-06-19 SURGERY — Surgical Case
Anesthesia: *Unknown

## 2018-06-19 SURGERY — NEPHRECTOMY, PARTIAL, ROBOT-ASSISTED
Anesthesia: General | Laterality: Left

## 2018-06-19 MED ORDER — PROMETHAZINE HCL 25 MG/ML IJ SOLN
INTRAMUSCULAR | Status: DC | PRN
  Administered 2018-06-19: 6.25 mg via INTRAVENOUS

## 2018-06-19 MED ORDER — PHENYLEPHRINE HCL 10 MG/ML IJ SOLN
INTRAMUSCULAR | Status: AC
  Filled 2018-06-19: qty 1

## 2018-06-19 MED ORDER — ACETAMINOPHEN 10 MG/ML IV SOLN
1000.0000 mg | Freq: Four times a day (QID) | INTRAVENOUS | Status: AC
  Administered 2018-06-19 – 2018-06-20 (×4): 1000 mg via INTRAVENOUS
  Filled 2018-06-19 (×4): qty 100

## 2018-06-19 MED ORDER — LACTATED RINGERS IV SOLN
INTRAVENOUS | Status: DC | PRN
  Administered 2018-06-19 (×3): via INTRAVENOUS

## 2018-06-19 MED ORDER — SCOPOLAMINE 1 MG/3DAYS TD PT72
MEDICATED_PATCH | TRANSDERMAL | Status: DC | PRN
  Administered 2018-06-19: 1 via TRANSDERMAL

## 2018-06-19 MED ORDER — PROMETHAZINE HCL 25 MG/ML IJ SOLN
INTRAMUSCULAR | Status: AC
  Filled 2018-06-19: qty 1

## 2018-06-19 MED ORDER — MEPERIDINE HCL 50 MG/ML IJ SOLN: 6.2500 mg | INTRAMUSCULAR | Status: DC | PRN

## 2018-06-19 MED ORDER — PHENYLEPHRINE HCL 10 MG/ML IJ SOLN
INTRAVENOUS | Status: DC | PRN
  Administered 2018-06-19: 25 ug/min via INTRAVENOUS

## 2018-06-19 MED ORDER — LACTATED RINGERS IV BOLUS
400.0000 mL | Freq: Once | INTRAVENOUS | Status: AC
  Administered 2018-06-19: 400 mL via INTRAVENOUS

## 2018-06-19 MED ORDER — DEXAMETHASONE SODIUM PHOSPHATE 10 MG/ML IJ SOLN
INTRAMUSCULAR | Status: DC | PRN
  Administered 2018-06-19: 10 mg via INTRAVENOUS

## 2018-06-19 MED ORDER — BUPIVACAINE-EPINEPHRINE 0.5% -1:200000 IJ SOLN
INTRAMUSCULAR | Status: DC | PRN
  Administered 2018-06-19: 30 mL

## 2018-06-19 MED ORDER — LACTATED RINGERS IV BOLUS
250.0000 mL | Freq: Once | INTRAVENOUS | Status: AC
  Administered 2018-06-19: 250 mL via INTRAVENOUS

## 2018-06-19 MED ORDER — STERILE WATER FOR IRRIGATION IR SOLN
Status: DC | PRN
  Administered 2018-06-19: 1000 mL

## 2018-06-19 MED ORDER — BUPIVACAINE LIPOSOME 1.3 % IJ SUSP
INTRAMUSCULAR | Status: DC | PRN
  Administered 2018-06-19: 20 mL

## 2018-06-19 MED ORDER — SODIUM CHLORIDE 0.9 % IJ SOLN
INTRAMUSCULAR | Status: AC
  Filled 2018-06-19: qty 10

## 2018-06-19 MED ORDER — SENNA 8.6 MG PO TABS
1.0000 | ORAL_TABLET | Freq: Two times a day (BID) | ORAL | Status: DC
  Administered 2018-06-19 – 2018-06-20 (×2): 8.6 mg via ORAL
  Filled 2018-06-19 (×2): qty 1

## 2018-06-19 MED ORDER — MIDAZOLAM HCL 5 MG/5ML IJ SOLN
INTRAMUSCULAR | Status: DC | PRN
  Administered 2018-06-19: 2 mg via INTRAVENOUS

## 2018-06-19 MED ORDER — BUPIVACAINE LIPOSOME 1.3 % IJ SUSP
20.0000 mL | Freq: Once | INTRAMUSCULAR | Status: DC
  Filled 2018-06-19: qty 20

## 2018-06-19 MED ORDER — LIDOCAINE 2% (20 MG/ML) 5 ML SYRINGE
INTRAMUSCULAR | Status: DC | PRN
  Administered 2018-06-19: 100 mg via INTRAVENOUS

## 2018-06-19 MED ORDER — PROMETHAZINE HCL 25 MG/ML IJ SOLN: 12.5000 mg | Freq: Four times a day (QID) | INTRAMUSCULAR | Status: DC | PRN

## 2018-06-19 MED ORDER — HYDROMORPHONE HCL 1 MG/ML IJ SOLN
0.5000 mg | INTRAMUSCULAR | Status: DC | PRN
  Administered 2018-06-19 – 2018-06-20 (×4): 0.5 mg via INTRAVENOUS
  Filled 2018-06-19 (×4): qty 0.5

## 2018-06-19 MED ORDER — ONDANSETRON HCL 4 MG/2ML IJ SOLN
INTRAMUSCULAR | Status: DC | PRN
  Administered 2018-06-19: 4 mg via INTRAVENOUS

## 2018-06-19 MED ORDER — KCL IN DEXTROSE-NACL 20-5-0.45 MEQ/L-%-% IV SOLN
INTRAVENOUS | Status: DC
  Administered 2018-06-19 – 2018-06-20 (×2): via INTRAVENOUS
  Filled 2018-06-19 (×2): qty 1000

## 2018-06-19 MED ORDER — DEXAMETHASONE SODIUM PHOSPHATE 10 MG/ML IJ SOLN
INTRAMUSCULAR | Status: AC
  Filled 2018-06-19: qty 1

## 2018-06-19 MED ORDER — FENTANYL CITRATE (PF) 100 MCG/2ML IJ SOLN
INTRAMUSCULAR | Status: DC | PRN
  Administered 2018-06-19 (×2): 50 ug via INTRAVENOUS
  Administered 2018-06-19: 100 ug via INTRAVENOUS
  Administered 2018-06-19: 50 ug via INTRAVENOUS

## 2018-06-19 MED ORDER — THROMBIN (RECOMBINANT) 5000 UNITS EX SOLR
CUTANEOUS | Status: AC
  Filled 2018-06-19: qty 5000

## 2018-06-19 MED ORDER — DOCUSATE SODIUM 100 MG PO CAPS
100.0000 mg | ORAL_CAPSULE | Freq: Two times a day (BID) | ORAL | Status: DC
  Administered 2018-06-19 – 2018-06-20 (×2): 100 mg via ORAL
  Filled 2018-06-19 (×2): qty 1

## 2018-06-19 MED ORDER — SODIUM CHLORIDE 0.9 % IJ SOLN
INTRAMUSCULAR | Status: DC | PRN
  Administered 2018-06-19: 20 mL

## 2018-06-19 MED ORDER — PROPOFOL 10 MG/ML IV BOLUS
INTRAVENOUS | Status: AC
  Filled 2018-06-19: qty 20

## 2018-06-19 MED ORDER — MIDAZOLAM HCL 2 MG/2ML IJ SOLN
INTRAMUSCULAR | Status: AC
  Filled 2018-06-19: qty 2

## 2018-06-19 MED ORDER — GEL-FLOW NT EX PRSY
PREFILLED_SYRINGE | CUTANEOUS | Status: DC | PRN
  Administered 2018-06-19: 1 via TOPICAL

## 2018-06-19 MED ORDER — FENTANYL CITRATE (PF) 250 MCG/5ML IJ SOLN
INTRAMUSCULAR | Status: AC
  Filled 2018-06-19: qty 5

## 2018-06-19 MED ORDER — EPHEDRINE SULFATE-NACL 50-0.9 MG/10ML-% IV SOSY
PREFILLED_SYRINGE | INTRAVENOUS | Status: DC | PRN
  Administered 2018-06-19 (×4): 10 mg via INTRAVENOUS

## 2018-06-19 MED ORDER — PROPOFOL 10 MG/ML IV BOLUS
INTRAVENOUS | Status: DC | PRN
  Administered 2018-06-19: 100 mg via INTRAVENOUS

## 2018-06-19 MED ORDER — LIDOCAINE 2% (20 MG/ML) 5 ML SYRINGE
INTRAMUSCULAR | Status: AC
  Filled 2018-06-19: qty 5

## 2018-06-19 MED ORDER — HYDROMORPHONE HCL 1 MG/ML IJ SOLN
INTRAMUSCULAR | Status: AC
  Administered 2018-06-19: 0.25 mg via INTRAVENOUS
  Filled 2018-06-19: qty 1

## 2018-06-19 MED ORDER — ROCURONIUM BROMIDE 10 MG/ML (PF) SYRINGE
PREFILLED_SYRINGE | INTRAVENOUS | Status: AC
  Filled 2018-06-19: qty 10

## 2018-06-19 MED ORDER — SUGAMMADEX SODIUM 500 MG/5ML IV SOLN
INTRAVENOUS | Status: AC
  Filled 2018-06-19: qty 5

## 2018-06-19 MED ORDER — HYDROMORPHONE HCL 1 MG/ML IJ SOLN
0.2500 mg | INTRAMUSCULAR | Status: DC | PRN
  Administered 2018-06-19 (×2): 0.25 mg via INTRAVENOUS

## 2018-06-19 MED ORDER — THROMBIN 5000 UNITS EX SOLR
CUTANEOUS | Status: DC | PRN
  Administered 2018-06-19: 5000 [IU] via TOPICAL

## 2018-06-19 MED ORDER — SODIUM CHLORIDE 0.9 % IJ SOLN
INTRAMUSCULAR | Status: AC
  Filled 2018-06-19: qty 20

## 2018-06-19 MED ORDER — SUGAMMADEX SODIUM 200 MG/2ML IV SOLN
INTRAVENOUS | Status: DC | PRN
  Administered 2018-06-19: 300 mg via INTRAVENOUS

## 2018-06-19 MED ORDER — LACTATED RINGERS IR SOLN
Status: DC | PRN
  Administered 2018-06-19: 1000 mL

## 2018-06-19 MED ORDER — SCOPOLAMINE 1 MG/3DAYS TD PT72
MEDICATED_PATCH | TRANSDERMAL | Status: AC
  Filled 2018-06-19: qty 1

## 2018-06-19 MED ORDER — SUCCINYLCHOLINE CHLORIDE 200 MG/10ML IV SOSY
PREFILLED_SYRINGE | INTRAVENOUS | Status: AC
  Filled 2018-06-19: qty 10

## 2018-06-19 MED ORDER — ROCURONIUM BROMIDE 50 MG/5ML IV SOSY
PREFILLED_SYRINGE | INTRAVENOUS | Status: DC | PRN
  Administered 2018-06-19: 50 mg via INTRAVENOUS
  Administered 2018-06-19: 10 mg via INTRAVENOUS
  Administered 2018-06-19: 20 mg via INTRAVENOUS
  Administered 2018-06-19 (×2): 10 mg via INTRAVENOUS

## 2018-06-19 MED ORDER — CEFAZOLIN SODIUM-DEXTROSE 2-4 GM/100ML-% IV SOLN
2.0000 g | INTRAVENOUS | Status: AC
  Administered 2018-06-19: 2 g via INTRAVENOUS
  Filled 2018-06-19: qty 100

## 2018-06-19 MED ORDER — ONDANSETRON HCL 4 MG/2ML IJ SOLN
INTRAMUSCULAR | Status: AC
  Filled 2018-06-19: qty 2

## 2018-06-19 MED ORDER — BUPIVACAINE-EPINEPHRINE (PF) 0.5% -1:200000 IJ SOLN
INTRAMUSCULAR | Status: AC
  Filled 2018-06-19: qty 30

## 2018-06-19 SURGICAL SUPPLY — 69 items
ADH SKN CLS APL DERMABOND .7 (GAUZE/BANDAGES/DRESSINGS) ×1
AGENT HMST KT MTR STRL THRMB (HEMOSTASIS) ×1
AGENT HMST MTR 8 SURGIFLO (HEMOSTASIS)
APL ESCP 34 STRL LF DISP (HEMOSTASIS)
APL SRG 38 LTWT LNG FL B (MISCELLANEOUS)
APPLICATOR ARISTA FLEXITIP XL (MISCELLANEOUS) IMPLANT
APPLICATOR SURGIFLO ENDO (HEMOSTASIS) IMPLANT
BAG SPEC RTRVL LRG 6X4 10 (ENDOMECHANICALS) ×1
CHLORAPREP W/TINT 26ML (MISCELLANEOUS) ×2 IMPLANT
CLIP VESOLOCK LG 6/CT PURPLE (CLIP) ×2 IMPLANT
CLIP VESOLOCK MED LG 6/CT (CLIP) ×4 IMPLANT
CLIP VESOLOCK XL 6/CT (CLIP) IMPLANT
COVER SURGICAL LIGHT HANDLE (MISCELLANEOUS) ×2 IMPLANT
COVER TIP SHEARS 8 DVNC (MISCELLANEOUS) ×1 IMPLANT
COVER TIP SHEARS 8MM DA VINCI (MISCELLANEOUS) ×2
DECANTER SPIKE VIAL GLASS SM (MISCELLANEOUS) ×2 IMPLANT
DERMABOND ADVANCED (GAUZE/BANDAGES/DRESSINGS) ×1
DERMABOND ADVANCED .7 DNX12 (GAUZE/BANDAGES/DRESSINGS) ×1 IMPLANT
DRAIN CHANNEL 15F RND FF 3/16 (WOUND CARE) ×2 IMPLANT
DRAPE ARM DVNC X/XI (DISPOSABLE) ×4 IMPLANT
DRAPE COLUMN DVNC XI (DISPOSABLE) ×1 IMPLANT
DRAPE DA VINCI XI ARM (DISPOSABLE) ×4
DRAPE DA VINCI XI COLUMN (DISPOSABLE) ×1
DRAPE INCISE IOBAN 66X45 STRL (DRAPES) ×2 IMPLANT
DRAPE SHEET LG 3/4 BI-LAMINATE (DRAPES) ×2 IMPLANT
ELECT PENCIL ROCKER SW 15FT (MISCELLANEOUS) ×2 IMPLANT
ELECT REM PT RETURN 15FT ADLT (MISCELLANEOUS) ×4 IMPLANT
EVACUATOR SILICONE 100CC (DRAIN) ×2 IMPLANT
GLOVE BIO SURGEON STRL SZ 6.5 (GLOVE) ×2 IMPLANT
GLOVE BIOGEL M STRL SZ7.5 (GLOVE) ×4 IMPLANT
GOWN STRL REUS W/TWL LRG LVL3 (GOWN DISPOSABLE) ×6 IMPLANT
HEMOSTAT ARISTA ABSORB 3G PWDR (MISCELLANEOUS) IMPLANT
IRRIG SUCT STRYKERFLOW 2 WTIP (MISCELLANEOUS) ×2
IRRIGATION SUCT STRKRFLW 2 WTP (MISCELLANEOUS) ×1 IMPLANT
KIT BASIN OR (CUSTOM PROCEDURE TRAY) ×2 IMPLANT
LOOP VESSEL MAXI BLUE (MISCELLANEOUS) IMPLANT
MARKER SKIN DUAL TIP RULER LAB (MISCELLANEOUS) ×2 IMPLANT
NDL INSUFFLATION 14GA 120MM (NEEDLE) IMPLANT
NEEDLE INSUFFLATION 14GA 120MM (NEEDLE) ×2 IMPLANT
NS IRRIG 1000ML POUR BTL (IV SOLUTION) ×1 IMPLANT
PAD POSITIONING PINK XL (MISCELLANEOUS) ×2 IMPLANT
PORT ACCESS TROCAR AIRSEAL 12 (TROCAR) ×1 IMPLANT
PORT ACCESS TROCAR AIRSEAL 5M (TROCAR) ×1
POSITIONER SURGICAL ARM (MISCELLANEOUS) ×4 IMPLANT
POUCH SPECIMEN RETRIEVAL 10MM (ENDOMECHANICALS) ×2 IMPLANT
SEAL CANN UNIV 5-8 DVNC XI (MISCELLANEOUS) ×4 IMPLANT
SEAL XI 5MM-8MM UNIVERSAL (MISCELLANEOUS) ×4
SET TRI-LUMEN FLTR TB AIRSEAL (TUBING) ×2 IMPLANT
SOLUTION ELECTROLUBE (MISCELLANEOUS) ×2 IMPLANT
SPOGE SURGIFLO 8M (HEMOSTASIS)
SPONGE SURGIFLO 8M (HEMOSTASIS) IMPLANT
SURGIFLO W/THROMBIN 8M KIT (HEMOSTASIS) ×2 IMPLANT
SUT ETHILON 3 0 PS 1 (SUTURE) ×2 IMPLANT
SUT MNCRL AB 4-0 PS2 18 (SUTURE) ×4 IMPLANT
SUT V-LOC BARB 180 2/0GR6 GS22 (SUTURE) ×2
SUT VIC AB 0 CT1 27 (SUTURE) ×2
SUT VIC AB 0 CT1 27XBRD ANTBC (SUTURE) ×1 IMPLANT
SUT VICRYL 0 UR6 27IN ABS (SUTURE) IMPLANT
SUT VLOC BARB 180 ABS3/0GR12 (SUTURE) ×4
SUTURE V-LC BRB 180 2/0GR6GS22 (SUTURE) ×1 IMPLANT
SUTURE VLOC BRB 180 ABS3/0GR12 (SUTURE) ×1 IMPLANT
TOWEL OR 17X26 10 PK STRL BLUE (TOWEL DISPOSABLE) ×2 IMPLANT
TOWEL OR NON WOVEN STRL DISP B (DISPOSABLE) ×2 IMPLANT
TRAY FOLEY CATH 14FRSI W/METER (CATHETERS) ×1 IMPLANT
TRAY FOLEY MTR SLVR 16FR STAT (SET/KITS/TRAYS/PACK) ×1 IMPLANT
TRAY LAPAROSCOPIC (CUSTOM PROCEDURE TRAY) ×2 IMPLANT
TROCAR BLADELESS OPT 5 100 (ENDOMECHANICALS) IMPLANT
TROCAR XCEL 12X100 BLDLESS (ENDOMECHANICALS) IMPLANT
WATER STERILE IRR 1000ML POUR (IV SOLUTION) ×1 IMPLANT

## 2018-06-19 NOTE — Anesthesia Preprocedure Evaluation (Signed)
Anesthesia Evaluation  Patient identified by MRN, date of birth, ID band Patient awake    Reviewed: Allergy & Precautions, NPO status , Patient's Chart, lab work & pertinent test results  History of Anesthesia Complications (+) PONV  Airway Mallampati: II  TM Distance: >3 FB Neck ROM: Full    Dental   Pulmonary sleep apnea ,    Pulmonary exam normal        Cardiovascular hypertension, Pt. on medications Normal cardiovascular exam     Neuro/Psych Anxiety Depression    GI/Hepatic GERD  Medicated and Controlled,  Endo/Other    Renal/GU      Musculoskeletal   Abdominal   Peds  Hematology   Anesthesia Other Findings   Reproductive/Obstetrics                             Anesthesia Physical Anesthesia Plan  ASA: III  Anesthesia Plan: General   Post-op Pain Management:    Induction: Intravenous  PONV Risk Score and Plan: 4 or greater and Ondansetron, Midazolam, Dexamethasone and Scopolamine patch - Pre-op  Airway Management Planned: Oral ETT  Additional Equipment:   Intra-op Plan:   Post-operative Plan: Extubation in OR  Informed Consent: I have reviewed the patients History and Physical, chart, labs and discussed the procedure including the risks, benefits and alternatives for the proposed anesthesia with the patient or authorized representative who has indicated his/her understanding and acceptance.     Plan Discussed with: CRNA and Surgeon  Anesthesia Plan Comments:         Anesthesia Quick Evaluation

## 2018-06-19 NOTE — Op Note (Signed)
Preoperative diagnosis:  1. Left renal mass   Postoperative diagnosis:  1. same   Procedure: 1. Robotic assisted laparoscopic left partial nephrectomy  Surgeon: Ardis Hughs, MD 1st assistant: Basilio Cairo, MD  Anesthesia: General  Complications: None  Intraoperative findings:  #1. Posterior endophytic mass #2. Warm ischemia time 17.68min  EBL: 450cc  Specimens: left renal mass  Indication: Sarah Cooley is a 52 y.o. patient with left renal mass.  After reviewing the management options for treatment, he elected to proceed with the above surgical procedure(s). We have discussed the potential benefits and risks of the procedure, side effects of the proposed treatment, the likelihood of the patient achieving the goals of the procedure, and any potential problems that might occur during the procedure or recuperation. Informed consent has been obtained.  Description of procedure:  The patient was taken to the operating room and a general anesthetic was administered. The patient was given preoperative antibiotics, placed in the right modified flank position with care to pad all potential pressure points, and prepped and draped in the usual sterile fashion. Next a preoperative timeout was performed.  A site was selected on the left side of the umbilicus for placement of the camera port. This was placed using a standard modified Hassan technique with entry into the peritoneum with a  8 mm tocar. We entered the peritoneum without incident and established pneumoperitoneum. The camera was then used to inspect the abdomen and there was no evidence of any intra-abdominal injuries or other abnormalities. The remaining abdominal ports were then placed. 8 mm robotic ports were placed in the left upper quadrant, left lower quadrant, and left lateral abdominal wall, making a soft J. A 12 mm port was placed in the upper midline for laparoscopic assistance. All ports were placed under direct  vision without difficulty. The surgical cart was then docked.   Utilizing the cautery scissors, the white line of Toldt was incised allowing the colon to be mobilized medially and the plane between the mesocolon and the anterior layer of Gerota's fascia to be developed and the kidney to be exposed. The ureter and gonadal vein were identified inferiorly and the ureter was lifted anteriorly off the psoas muscle. Dissection proceeded superiorly along the gonadal vein until the renal vein was identified. The renal hilum was then carefully isolated with a combination of blunt and sharp dissection allowing the renal arterial and venous structures to be separated and isolated in preparation for renal hilar vessel clamping.  Attention turned to the kidney and the perinephric fat surrounding the renal mass was removed and the kidney was mobilized sufficiently for exposure and resection of the renal mass.   Once the renal mass was properly isolated, preparations were made for resection of the tumor. The renal artery was then clamped with a bulldog clamp. The tumor was then excised with cold scissor dissection along with an adequate visible gross margin of normal renal parenchyma. The tumor appeared to be excised without any gross violation of the tumor. The renal collecting system was not entered during removal of the tumor. A running 3-0 V-lock suture was then brought through the capsule of the kidney and run along the base of the renal defect to provide hemostasis and close any entry into the renal collecting system if present. Weck clips were used to secure this suture outside the renal capsule at the proximal and distal ends. The bulldog clamps were then removed from the renal hilar vessel. A running 2-0 V lock  suture was then used to close the capsule of the kidney using a sliding clip technique which resulted in excellent hemostasis. An additional hemostatic agent (Surgiflo) was then placed into the renal  defect.   Total warm renal ischemia time was 17  minutes. The renal tumor resection site was examined. Hemostasis appeared adequate.   The kidney was placed back into its normal anatomic position and covered with perinephric fat as needed. A # 28 Blake drain was then brought through the lateral lower port site and positioned in the perinephric space. It was secured to the skin with a nylon suture. The surgical robotic cart was undocked. The renal tumor specimen was removed intact within an endopouch retrieval bag via the camera port sites. The camera port site and the other 12 mm port site were then closed at the fascial level with 0-vicryl suture. All other laparoscopic/robotic ports were removed under direct vision and the pneumoperitoneum let down with inspection of the operative field performed and hemostasis again confirmed. All incision sites were then injected with local anesthetic and reapproximated at the skin level with 4-0 monocryl subcuticular closures. Dermabond was applied to the skin. The patient tolerated the procedure well and without complications. The patient was able to be extubated and transferred to the recovery unit in satisfactory condition.   Ardis Hughs, M.D.

## 2018-06-19 NOTE — Transfer of Care (Signed)
Immediate Anesthesia Transfer of Care Note  Patient: Sarah Cooley  Procedure(s) Performed: XI ROBOTIC ASSITED PARTIAL NEPHRECTOMY (Left )  Patient Location: PACU  Anesthesia Type:General  Level of Consciousness: awake, alert  and oriented  Airway & Oxygen Therapy: Patient Spontanous Breathing and Patient connected to face mask oxygen  Post-op Assessment: Report given to RN and Post -op Vital signs reviewed and stable  Post vital signs: Reviewed and stable  Last Vitals:  Vitals Value Taken Time  BP 124/74 06/19/2018 11:30 AM  Temp    Pulse 101 06/19/2018 11:32 AM  Resp 15 06/19/2018 11:32 AM  SpO2 100 % 06/19/2018 11:32 AM  Vitals shown include unvalidated device data.  Last Pain:  Vitals:   06/19/18 0603  TempSrc:   PainSc: 0-No pain         Complications: No apparent anesthesia complications

## 2018-06-19 NOTE — Interval H&P Note (Signed)
History and Physical Interval Note:  06/19/2018 7:20 AM  Sarah Cooley  has presented today for surgery, with the diagnosis of RENAL MASS  The various methods of treatment have been discussed with the patient and family. After consideration of risks, benefits and other options for treatment, the patient has consented to  Procedure(s): XI ROBOTIC ASSITED PARTIAL NEPHRECTOMY (Left) as a surgical intervention .  The patient's history has been reviewed, patient examined, no change in status, stable for surgery.  I have reviewed the patient's chart and labs.  Questions were answered to the patient's satisfaction.     Louis Meckel W

## 2018-06-19 NOTE — Anesthesia Postprocedure Evaluation (Signed)
Anesthesia Post Note  Patient: Sarah Cooley  Procedure(s) Performed: XI ROBOTIC ASSITED PARTIAL NEPHRECTOMY (Left )     Patient location during evaluation: PACU Anesthesia Type: General Level of consciousness: awake and alert Pain management: pain level controlled Vital Signs Assessment: post-procedure vital signs reviewed and stable Respiratory status: spontaneous breathing, nonlabored ventilation, respiratory function stable and patient connected to nasal cannula oxygen Cardiovascular status: blood pressure returned to baseline and stable Postop Assessment: no apparent nausea or vomiting Anesthetic complications: no    Last Vitals:  Vitals:   06/19/18 1400 06/19/18 1420  BP: 99/70 118/71  Pulse: 85 83  Resp: 13 16  Temp: 36.7 C 36.9 C  SpO2: 96% 97%    Last Pain:  Vitals:   06/19/18 1420  TempSrc: Oral  PainSc:                  Mariely Mahr DAVID

## 2018-06-19 NOTE — Care Management Note (Signed)
Case Management Note  Patient Details  Name: Sarah Cooley MRN: [id number] Date of Birth: 05-04-1966  Subjective/Objective:Admitted wL renal mass. S/p L partial nephrectomy. From home. Has pcp,pharmacy,transp. No CM needs.                    Action/Plan:d/c home.   Expected Discharge Date:                  Expected Discharge Plan:     In-House Referral:     Discharge planning Services  CM Consult  Post Acute Care Choice:    Choice offered to:     DME Arranged:    DME Agency:     HH Arranged:    HH Agency:     Status of Service:  In process, will continue to follow  If discussed at Long Length of Stay Meetings, dates discussed:    Additional Comments:  Dessa Phi, RN 06/19/2018, 12:51 PM

## 2018-06-19 NOTE — H&P (Signed)
Pt presents today for pre-operative history and physical exam in anticipation of left robotic assisted laparoscopic partial nephrectomy on 06/19/18 by Dr. Louis Meckel. The pt has had diarrhea and nausea per her report since February of this year. She states she has been evaled by her PCP and a GI physician with no clear cause of her sx identified although several weeks ago she was diagnosed with possible diverticulitis and started on a 7 day course of Cipro and Flagyl. She states this treatment did not improve her sx. She was then admitted to the hospital in [place] on 05/24/18 for severe hypokalemia. Her K was below 2 at the time of admission and only came up to 2.6 after 6 runs of potassium. Per her report she was in the hospital for 5 days and received a total of 24 runs of IV potassium in addition to oral K to get to a value of 3.6 at the time of discharge. During this hospitalization she states she was evaled by a GI physician and diagnosed with C. Diff. She is currently taking Vancomycin 125 mg po QID for a 14 day course.   She has had left sided chest pain for approx 2-3 weeks. She describes it as a sharp, pulsating pain close to sternum with no radiation. It is intermittent with no associated nausea or SOB. She thinks it may be related to stress but she cannot determine any specific causes or situations that lead to it. She does have some SOB with climbing stairs and occasionally walking. She denies orthopnea and SOB at rest. She denies new LE edema and pain. She states she did not have a cardiopulmonary eval during her recent hospitalization. She did have a cardiac eval by Dr. Deretha Emory over 2 years ago and was told it was "normal".   She currently denies F/C, HA, CP, SOB, vomiting, abdominal pain, hematuria, and dysuria.    HX:     CC: Renal Mass Surveillance  HPI: Sarah Cooley is a 52 year-old female established patient who is here ongoing eval and management of a renal mass.  Simple  Hilum   The patient presents today for discussion of her renal mass. She had a CT scan performed 2 weeks ago demonstrating a enhancing renal mass in the left posterior aspect concerning for renal cell carcinoma. The initial indication for the CT scan was ongoing or chronic diarrhea with diffuse abdominal pain and a 15-20 pound weight loss. She also was complaining of burning in her legs and severe fatigue. The etiology of her GI symptoms remains unclear.   The patient has a history of IgM monoclonal gammopathy. She has a history of a brain aneurysm and AVM malformation in her cerebral vasculature. She also has a history of breast cancer.   The mass is on the left side.   The lesion(s) was first noted on 02/07/2018. The mass was seen on MRI Scan.   The mass was last imaged 1 week ago. The area was described as 2cm posterior - mid pole.     ALLERGIES: Altace - pancreatitis lyrica - Dizziness, Nausea, Headache Minocin - Hives Septra - acute renal failure Shrimp - Anaphylaxis, face swelling and eye swelling    MEDICATIONS: Bentyl 10 mg/ml ampul  Clonazepam 1 mg tablet  Ergocalciferol  Fluoxetine Hcl 60 mg tablet  Pravastatin Sodium 10 mg tablet  Topamax 25 mg tablet  Tramadol Hcl 50 mg tablet     Notes: vancomycin 125mg  QID x 14 days. started 05/27/18  TUMS  BID  Tramadol for back pain from fall; takes only 2 pills per week   GU PSH: None     PSH Notes: R knee ACL and Meniscus repair  R wrist graft  right breast lumpectomy with subsequent radiation  spark sling   NON-GU PSH: Carpal tunnel surgery Cholecystectomy (laparoscopic) Hysterectomy/bladder Repair    GU PMH: Benign Neo Kidney, Unspec - 05/09/2018, - 03/10/2018 Kidney Failure Unspec      PMH Notes: heart palpitations occasionally  fibromyalgia  chronic fatigue  IGM  AV malformation followed by Dr. Floyde Parkins last eval within a year    NON-GU PMH: Anxiety Arrhythmia Arthritis Breast Cancer,  History Depression GERD Hypercholesterolemia Hypertension Sleep Apnea    FAMILY HISTORY: 1 Daughter - No Family History 1 son - No Family History Breast Cancer - Aunt, Sister Diabetes - Aunt lupus - Mother stroke - Grandmother, Mother   SOCIAL HISTORY: Marital Status: Married Preferred Language: English; Race: White Current Smoking Status: Patient has never smoked.   Tobacco Use Assessment Completed: Used Tobacco in last 30 days? Does not use smokeless tobacco. Has never drank.  Does not use drugs. Drinks 1 caffeinated drink per day. Has not had a blood transfusion. Patient's occupation Publishing copy.    REVIEW OF SYSTEMS:    GU Review Female:   Patient reports burning /pain with urination, trouble starting your stream, and have to strain to urinate. Patient denies frequent urination, hard to postpone urination, get up at night to urinate, leakage of urine, stream starts and stops, and being pregnant.  Gastrointestinal (Upper):   Patient reports indigestion/ heartburn. Patient denies nausea and vomiting.  Gastrointestinal (Lower):   Patient reports diarrhea. Patient denies constipation.  Constitutional:   Patient reports fatigue. Patient denies night sweats, fever, and weight loss.  Skin:   Patient reports itching. Patient denies skin rash/ lesion.  Eyes:   Patient reports blurred vision. Patient denies double vision.  Ears/ Nose/ Throat:   Patient denies sore throat and sinus problems.  Hematologic/Lymphatic:   Patient denies swollen glands and easy bruising.  Cardiovascular:   Patient reports chest pains. Patient denies leg swelling.  Respiratory:   Patient reports shortness of breath. Patient denies cough.  Endocrine:   Patient denies excessive thirst.  Musculoskeletal:   Patient reports back pain and joint pain.   Neurological:   Patient reports headaches. Patient denies dizziness.  Psychologic:   Patient reports depression and anxiety.    VITAL SIGNS:       06/03/2018 03:07 PM  Weight 228.9 lb / 103.83 kg  Height 68 in / 172.72 cm  BP 107/73 mmHg  Pulse 63 /min  Temperature 98.2 F / 36.7 C  BMI 34.8 kg/m   MULTI-SYSTEM PHYSICAL EXAMINATION:    Constitutional: Well-nourished. No physical deformities. Normally developed. Good grooming.  Neck: Neck symmetrical, not swollen. Normal tracheal position.  Respiratory: Normal breath sounds. No labored breathing, no use of accessory muscles.   Cardiovascular: Regular rate and rhythm. No murmur, no gallop. Normal temperature, normal extremity pulses.   Lymphatic: No enlargement of neck, axillae, groin.  Skin: No paleness, no jaundice, no cyanosis. No lesion, no ulcer, no rash.  Neurologic / Psychiatric: Oriented to time, oriented to place, oriented to person. No depression, no anxiety, no agitation.  Gastrointestinal: No mass, no tenderness, no rigidity, non obese abdomen.  Eyes: Normal conjunctivae. Normal eyelids.  Ears, Nose, Mouth, and Throat: Left ear no scars, no lesions, no masses. Right ear no scars, no lesions, no  masses. Nose no scars, no lesions, no masses. Normal hearing. Normal lips.  Musculoskeletal: Normal gait and station of head and neck.     PAST DATA REVIEWED:  Source Of History:  Patient  Records Review:   Previous Patient Records  Urine Test Review:   Urinalysis   06/03/18  Urinalysis  Urine Appearance Clear   Urine Color Yellow   Urine Glucose Neg mg/dL  Urine Bilirubin Neg mg/dL  Urine Ketones Neg mg/dL  Urine Specific Gravity <=1.005   Urine Blood Neg ery/uL  Urine pH 7.0   Urine Protein Neg mg/dL  Urine Urobilinogen 0.2 mg/dL  Urine Nitrites Neg   Urine Leukocyte Esterase Neg leu/uL   PROCEDURES:          Urinalysis Dipstick Dipstick Cont'd  Color: Yellow Bilirubin: Neg mg/dL  Appearance: Clear Ketones: Neg mg/dL  Specific Gravity: <=1.005 Blood: Neg ery/uL  pH: 7.0 Protein: Neg mg/dL  Glucose: Neg mg/dL Urobilinogen: 0.2 mg/dL    Nitrites: Neg     Leukocyte Esterase: Neg leu/uL    ASSESSMENT:      ICD-10 Details  1 GU:   Benign Neo Kidney, Unspec - D30.00    PLAN:           Schedule Return Visit/Planned Activity: Keep Scheduled Appointment - Schedule Surgery          Document Letter(s):  Created for Patient: Clinical Summary         Notes:   Pt is scheduled to undergo robotic partial nephrectomy 06/19/18 by Dr. Louis Meckel. She has a complicated medical hx and recent development of hypokalemia, C Diff, chest pain, and SOB. I discussed these issues with Dr. Louis Meckel today and he requested that the pt see her PCP to f/u on all of these issues and determine if the pt needs a cardiac eval prior to her scheduled surgery.   I advised the pt to call her PCP tomorrow to arrange f/u ASAP. She understands that if a cardiac eval is necessary it may delay her kidney surgery as well as the fact that a delay is safe from a urologic standpoint. She and her husband both voiced understanding of the situation and agreed with plan.   All pt's questions were answered to the best of my ability.

## 2018-06-19 NOTE — Treatment Plan (Signed)
Post op check Patient feeling well, pain moderately well controlled. Required iv dilaudid push Hgb 13.9 in PACU Incisions look fine, abdomen soft Patient tolerating sips of clears Adequate uop, no hematuria

## 2018-06-19 NOTE — Anesthesia Procedure Notes (Signed)
Procedure Name: Intubation Date/Time: 06/19/2018 7:34 AM Performed by: Maxwell Caul, CRNA Pre-anesthesia Checklist: Patient identified, Emergency Drugs available, Suction available and Patient being monitored Patient Re-evaluated:Patient Re-evaluated prior to induction Oxygen Delivery Method: Circle system utilized Preoxygenation: Pre-oxygenation with 100% oxygen Induction Type: IV induction Ventilation: Mask ventilation without difficulty Laryngoscope Size: Mac and 4 Grade View: Grade I Tube type: Oral Tube size: 7.5 mm Number of attempts: 1 Airway Equipment and Method: Stylet Placement Confirmation: ETT inserted through vocal cords under direct vision,  positive ETCO2 and breath sounds checked- equal and bilateral Secured at: 21 cm Tube secured with: Tape Dental Injury: Teeth and Oropharynx as per pre-operative assessment

## 2018-06-19 NOTE — Discharge Instructions (Signed)

## 2018-06-20 ENCOUNTER — Encounter (HOSPITAL_COMMUNITY): Payer: Self-pay | Admitting: Urology

## 2018-06-20 LAB — BASIC METABOLIC PANEL
ANION GAP: 5 (ref 5–15)
BUN: 10 mg/dL (ref 6–20)
CALCIUM: 8.4 mg/dL — AB (ref 8.9–10.3)
CO2: 23 mmol/L (ref 22–32)
Chloride: 110 mmol/L (ref 98–111)
Creatinine, Ser: 1.12 mg/dL — ABNORMAL HIGH (ref 0.44–1.00)
GFR, EST NON AFRICAN AMERICAN: 55 mL/min — AB (ref >60)
Glucose, Bld: 139 mg/dL — ABNORMAL HIGH (ref 70–99)
Potassium: 4.5 mmol/L (ref 3.5–5.1)
Sodium: 138 mmol/L (ref 135–145)

## 2018-06-20 LAB — CBC
HCT: 34.7 % — ABNORMAL LOW (ref 36.0–46.0)
Hemoglobin: 11.8 g/dL — ABNORMAL LOW (ref 12.0–15.0)
MCH: 32.1 pg (ref 26.0–34.0)
MCHC: 34 g/dL (ref 30.0–36.0)
MCV: 94.3 fL (ref 78.0–100.0)
PLATELETS: 194 10*3/uL (ref 150–400)
RBC: 3.68 MIL/uL — ABNORMAL LOW (ref 3.87–5.11)
RDW: 13.5 % (ref 11.5–15.5)
WBC: 12 10*3/uL — AB (ref 4.0–10.5)

## 2018-06-20 LAB — CREATININE, FLUID (PLEURAL, PERITONEAL, JP DRAINAGE): Creat, Fluid: 1.1 mg/dL

## 2018-06-20 MED ORDER — PRAVASTATIN SODIUM 20 MG PO TABS
10.0000 mg | ORAL_TABLET | Freq: Every day | ORAL | Status: DC
  Administered 2018-06-20: 10 mg via ORAL
  Filled 2018-06-20: qty 1

## 2018-06-20 MED ORDER — TOPIRAMATE 25 MG PO TABS
25.0000 mg | ORAL_TABLET | Freq: Every day | ORAL | Status: DC
  Administered 2018-06-20: 25 mg via ORAL
  Filled 2018-06-20: qty 1

## 2018-06-20 MED ORDER — FLUOXETINE HCL 20 MG PO CAPS
60.0000 mg | ORAL_CAPSULE | Freq: Every day | ORAL | Status: DC
  Administered 2018-06-20: 60 mg via ORAL
  Filled 2018-06-20: qty 3

## 2018-06-20 MED ORDER — OXYCODONE HCL 5 MG PO TABS
5.0000 mg | ORAL_TABLET | ORAL | Status: DC | PRN
  Administered 2018-06-20: 5 mg via ORAL
  Filled 2018-06-20: qty 1

## 2018-06-20 MED ORDER — DICYCLOMINE HCL 20 MG PO TABS
20.0000 mg | ORAL_TABLET | Freq: Four times a day (QID) | ORAL | Status: DC
  Administered 2018-06-20 (×2): 20 mg via ORAL
  Filled 2018-06-20 (×4): qty 1

## 2018-06-20 MED ORDER — TRAMADOL HCL 50 MG PO TABS
50.0000 mg | ORAL_TABLET | Freq: Four times a day (QID) | ORAL | Status: DC | PRN
  Administered 2018-06-20: 50 mg via ORAL
  Filled 2018-06-20: qty 1

## 2018-06-20 MED ORDER — ALUM & MAG HYDROXIDE-SIMETH 200-200-20 MG/5ML PO SUSP
30.0000 mL | Freq: Four times a day (QID) | ORAL | Status: DC | PRN
  Administered 2018-06-20: 30 mL via ORAL
  Filled 2018-06-20: qty 30

## 2018-06-20 MED ORDER — TRAMADOL HCL 50 MG PO TABS: 50.0000 mg | ORAL_TABLET | Freq: Four times a day (QID) | ORAL | Status: DC | PRN

## 2018-06-20 MED ORDER — OXYCODONE HCL 5 MG PO TABS: 5.0000 mg | ORAL_TABLET | ORAL | 0 refills | Status: DC | PRN

## 2018-06-20 MED ORDER — TOPIRAMATE 25 MG PO TABS: 50.0000 mg | ORAL_TABLET | Freq: Every day | ORAL | Status: DC

## 2018-06-20 MED ORDER — OXYCODONE HCL 5 MG PO TABS: 5.0000 mg | ORAL_TABLET | ORAL | Status: DC | PRN

## 2018-06-20 NOTE — Progress Notes (Signed)
Urology Progress Note   1 Day Post-Op s/p Left partial nephrectomy  Subjective: NAEON. Ongoing diffuse abdominal discomfort, likely insufflation discomfort. Required prn IV dilaudid pushes. Pain fairly well controlled this morning. Tolerated bites of mashed potatoes. exclelent uop at 2.3L. JP w/ 435cc output total, serosang.   Objective: Vital signs in last 24 hours: Temp:  [97.9 F (36.6 C)-99.4 F (37.4 C)] 98.3 F (36.8 C) (08/30 0410) Pulse Rate:  [73-102] 80 (08/30 0410) Resp:  [12-16] 14 (08/30 0410) BP: (84-139)/(53-74) 96/55 (08/30 0410) SpO2:  [95 %-100 %] 96 % (08/30 0410)  Intake/Output from previous day: 08/29 0701 - 08/30 0700 In: 4844.3 [P.O.:240; I.V.:3404.3; IV Piggyback:1200] Out: 1410 [Urine:2165; Drains:425; Blood:450] Intake/Output this shift: No intake/output data recorded.  Physical Exam:  General: Alert and oriented CV: RRR Lungs: Clear Abdomen: Soft, appropriately tender. Port incisions clean dry and intact with surgical glue. JP with serosang output GU: Foley in place draining medium amber tinged urine, no hematuria  Ext: NT, No erythema  Lab Results: Recent Labs    06/19/18 1153 06/20/18 0532  HGB 13.9 11.8*  HCT 40.1 34.7*   BMET Recent Labs    06/19/18 1153 06/20/18 0532  NA 142 138  K 3.5 4.5  CL 108 110  CO2 22 23  GLUCOSE 158* 139*  BUN 9 10  CREATININE 1.02* 1.12*  CALCIUM 8.5* 8.4*     Studies/Results: No results found.  Assessment/Plan:  52 y.o. female s/p Left partial nephrectomy on 06/19/18.  Overall doing well post-op.   - tramadol, IV dilaudid breakthough - advance diet to regular, med lock - DC foley catheter, follow up TOV - monitor JP output. We will send for stat JP Cr this morning - OOB, ambulation    Dispo: possible discharge to home later today, will re-assess clinical status in afternoon   LOS: 1 day   Fredricka Bonine 06/20/2018, 7:08 AM

## 2018-06-20 NOTE — Progress Notes (Signed)
Patient complained of dizziness and weakness after getting out of bed to use the rest room. BP assessed 88/59. Pt also c/o nausea. After returning to bed BP reassessed 98/58.Will complete bladder scan and  contact provider with update.

## 2018-06-20 NOTE — Progress Notes (Signed)
BP reassessed, see flow sheet. Pt remains a&ox4, ambulatory. Discharge instructions reviewed, questions concerns denied. Hard copy prescription provided. Additional supplies given for JP site.

## 2018-06-26 ENCOUNTER — Other Ambulatory Visit: Payer: Self-pay | Admitting: Urology

## 2018-06-26 ENCOUNTER — Emergency Department (HOSPITAL_COMMUNITY)
Admission: EM | Admit: 2018-06-26 | Discharge: 2018-06-26 | Disposition: A | Discharge status: Home / Self Care | Payer: BLUE CROSS/BLUE SHIELD | Attending: Emergency Medicine | Admitting: Emergency Medicine

## 2018-06-26 ENCOUNTER — Encounter (HOSPITAL_COMMUNITY): Payer: Self-pay | Admitting: Emergency Medicine

## 2018-06-26 ENCOUNTER — Other Ambulatory Visit: Payer: Self-pay

## 2018-06-26 DIAGNOSIS — N189 Chronic kidney disease, unspecified: Secondary | ICD-10-CM | POA: Insufficient documentation

## 2018-06-26 DIAGNOSIS — Z79899 Other long term (current) drug therapy: Secondary | ICD-10-CM | POA: Insufficient documentation

## 2018-06-26 DIAGNOSIS — L244 Irritant contact dermatitis due to drugs in contact with skin: Secondary | ICD-10-CM | POA: Diagnosis not present

## 2018-06-26 DIAGNOSIS — I129 Hypertensive chronic kidney disease with stage 1 through stage 4 chronic kidney disease, or unspecified chronic kidney disease: Secondary | ICD-10-CM | POA: Insufficient documentation

## 2018-06-26 DIAGNOSIS — Z853 Personal history of malignant neoplasm of breast: Secondary | ICD-10-CM | POA: Insufficient documentation

## 2018-06-26 DIAGNOSIS — L089 Local infection of the skin and subcutaneous tissue, unspecified: Secondary | ICD-10-CM | POA: Diagnosis not present

## 2018-06-26 DIAGNOSIS — D3000 Benign neoplasm of unspecified kidney: Secondary | ICD-10-CM

## 2018-06-26 MED ORDER — TRIAMCINOLONE ACETONIDE 0.5 % EX OINT: 1.0000 "application " | TOPICAL_OINTMENT | Freq: Two times a day (BID) | CUTANEOUS | 0 refills | Status: DC

## 2018-06-26 MED ORDER — PREDNISONE 20 MG PO TABS: 40.0000 mg | ORAL_TABLET | Freq: Every day | ORAL | 0 refills | Status: DC

## 2018-06-26 MED ORDER — PREDNISONE 20 MG PO TABS
40.0000 mg | ORAL_TABLET | Freq: Once | ORAL | Status: AC
  Administered 2018-06-26: 40 mg via ORAL
  Filled 2018-06-26: qty 2

## 2018-06-26 NOTE — ED Triage Notes (Signed)
Pt states she had a partial nephrectomy on on August 29th 2019. Pt states her incisions are itching and burning with a rash around the incision sites. Pt states she has has clear drainage from one incision site.

## 2018-06-26 NOTE — Discharge Instructions (Signed)
Return to the ER for severe or worsening symptoms including increased redness, pain or fevers.   Apply triamcinolone cream twice a daily,  Prednisone 40 mg daily for 5 days  Your surgeon and your follow-up  If this gets worse with time you should be seen immediately

## 2018-06-26 NOTE — ED Provider Notes (Signed)
[facility] EMERGENCY DEPARTMENT Provider Note   CSN: [id number] Arrival date & time: 06/26/18  1933     History   Chief Complaint Chief Complaint  Patient presents with  . Post-op Problem    HPI Sarah Cooley is a 52 y.o. female.  HPI  The patient is a 52 year old female, she has a known history of breast cancer status post resection, history of renal cell carcinoma status post partial nephrectomy within the last month.  She has recently been discharged from the hospital, her surgical sites were treated with tissue adhesive (she states she has a known allergy to this), and over the last couple of days she has had increased redness and itching around each of her incisional marks.  She has been taking Benadryl with minimal relief, symptoms are persistent, gradually worsening, nothing seems to make this better or worse.  She has no fever, she has been eating without any nausea or vomiting, otherwise doing well postoperatively.  Past Medical History:  Diagnosis Date  . Allergy    seasonal  . Anxiety   . Arthritis   . Balance problem   . Brain aneurysm    2 mm ACA region aneurysm by 09/21/15 MRA  . Breast cancer ([place])   . Breast cancer of lower-inner quadrant of right female breast ([place]) 08/26/2015  . Chronic kidney disease    acute renal failure one occasion  . Complication of anesthesia    heart rate drops and O2 sats drop  . Constipation   . Depression   . Diplopia 10/29/2017  . Dizziness 10/29/2017  . Elevated liver function tests   . Family history of adverse reaction to anesthesia    mom has n/v  . Family history of breast cancer   . Family history of colon cancer   . Fibromyalgia   . Fibromyalgia   . Gait abnormality 10/29/2017  . Genetic testing 09/08/2015   Negative genetic testing on the Breast/High Moderate Risk panel. The Breast High/Moderate Risk gene panel offered by [facility] includes sequencing and deletion/duplication analysis of the following 9 genes: ATM,  BRCA1, BRCA2, CDH1, CHEK2, PALB2, PTEN, STK11, and TP53. The report date is September 08, 2015.  The Rest of the Comprehensive Cancer panel has been reflexed and will be available in 2-3 weeks.  POLD1 c.208G>T VUS found on the Remainder of the Comprehensive cancer panel.  The Comprehensive Cancer Panel offered by [facility] includes sequencing and/or deletion duplication testing of the following 32 genes: APC, ATM, AXIN2, BARD1, BMPR1A, BRCA1, BRCA2, BRIP1, CDH1, CDK4, CDKN2A, CHEK2, EPCAM, FANCC, MLH1, MSH2, MSH6, MUTYH, NBN, PALB2, PMS2, POLD1, POLE, PTEN, RAD51C, RAD51D, SCG5/GREM1, SMAD4, STK11, TP53, VHL, and XRCC2.   The report date is 09/12/2015.   Marland Kitchen GERD (gastroesophageal reflux disease)    none recent  . H/O acute renal failure 09/22/15   admitted to [facility]  . Headache    occipital and temporal  . History of hiatal hernia   . Hyperlipemia   . Hyperlipidemia   . Hypertension   . Irritable bowel syndrome (IBS)   . MGUS (monoclonal gammopathy of unknown significance) 03/15/2016  . Neuromuscular disorder (HCC)    fibromyalgia  . Occasional tremors   . OSA (obstructive sleep apnea)    uses cpap with humidification- auto   . Personal history of radiation therapy   . PONV (postoperative nausea and vomiting)   . Renal cell carcinoma ([place])   . Tremor, essential 10/29/2017    Patient Active Problem List  Diagnosis Date Noted  . Renal mass, left 06/19/2018  . Clostridium difficile diarrhea   . Prolonged Q-T interval on ECG   . Weakness   . Hypokalemia, gastrointestinal losses 05/25/2018  . Hypokalemia 05/24/2018  . Dizziness 10/29/2017  . Gait abnormality 10/29/2017  . Tremor, essential 10/29/2017  . Diplopia 10/29/2017  . Obstructive sleep apnea on CPAP 12/10/2016  . Plantar fasciitis, bilateral 12/10/2016  . Fibromyalgia 11/09/2016  . IBS (irritable bowel syndrome) 11/09/2016  . GERD (gastroesophageal reflux disease) 11/09/2016  . Anxiety and depression 11/09/2016  . MGUS  (monoclonal gammopathy of unknown significance) 03/15/2016  . Breast cancer of lower-inner quadrant of right female breast ([place]) 08/26/2015  . Headache 07/17/2015  . Hypertension 07/17/2015  . Obesity (BMI 35.0-39.9 without comorbidity) 07/17/2015    Past Surgical History:  Procedure Laterality Date  . ABDOMINAL HYSTERECTOMY     adenomyosis  . ABDOMINAL HYSTERECTOMY    . BLADDER SURGERY    . BONE GRAFT HIP ILIAC CREST    . BREAST BIOPSY Bilateral   . BREAST LUMPECTOMY    . BREAST SURGERY    . CHOLECYSTECTOMY    . FRACTURE SURGERY Right    wrist  . KNEE ARTHROSCOPY    . KNEE ARTHROSCOPY W/ ACL RECONSTRUCTION     right  . RADIOACTIVE SEED GUIDED PARTIAL MASTECTOMY WITH AXILLARY SENTINEL LYMPH NODE BIOPSY Right 10/13/2015   Procedure: RADIOACTIVE SEED GUIDED PARTIAL MASTECTOMY WITH AXILLARY SENTINEL LYMPH NODE BIOPSY;  Surgeon: Erroll Luna, MD;  Location: [place];  Service: General;  Laterality: Right;  . right carpal and cubital tunnel release     . ROBOTIC ASSITED PARTIAL NEPHRECTOMY Left 06/19/2018   Procedure: XI ROBOTIC ASSITED PARTIAL NEPHRECTOMY;  Surgeon: Ardis Hughs, MD;  Location: [facility];  Service: Urology;  Laterality: Left;  . WRIST SURGERY       OB History   None      Home Medications    Prior to Admission medications   Medication Sig Start Date End Date Taking? Authorizing Provider  ALPRAZolam Duanne Moron) 0.5 MG tablet Take 0.5 mg by mouth. 06/10/18   [provider]  calcium carbonate (TUMS - DOSED IN MG ELEMENTAL CALCIUM) 500 MG chewable tablet Chew 1 tablet (200 mg of elemental calcium total) by mouth 2 (two) times daily with breakfast and lunch. 05/29/18   Johnson, Clanford L, MD  dicyclomine (BENTYL) 20 MG tablet Take 20 mg by mouth every 6 (six) hours.    [provider]  FLUoxetine (PROZAC) 20 MG capsule Take 60 mg by mouth daily. 06/19/18   [provider]  FLUoxetine HCl 60 MG TABS Take 60 mg by mouth daily.     [provider]  furosemide (LASIX) 20 MG tablet Take 1 tablet (20 mg total) by mouth daily as needed for edema. 06/11/18 09/09/18  Arnoldo Lenis, MD  losartan (COZAAR) 25 MG tablet Take 25 mg by mouth daily. 06/10/18   [provider]  OVER THE COUNTER MEDICATION Take 1 capsule by mouth daily as needed. Diphenhydramate Over The Counter Anti-emetic    [provider]  oxyCODONE (ROXICODONE) 5 MG immediate release tablet Take 1-2 tablets (5-10 mg total) by mouth every 4 (four) hours as needed. 06/20/18   Ardis Hughs, MD  potassium chloride SA (K-DUR,KLOR-CON) 20 MEQ tablet TAKE 20 MEQ (1 TABLET) AS NEEDED ON DAYS YOU TAKE THE LASIX. 06/11/18   Arnoldo Lenis, MD  pravastatin (PRAVACHOL) 10 MG tablet Take 10 mg by  mouth daily.    [provider]  predniSONE (DELTASONE) 20 MG tablet Take 2 tablets (40 mg total) by mouth daily. 06/26/18   Noemi Chapel, MD  saccharomyces boulardii (FLORASTOR) 250 MG capsule Take 1 capsule (250 mg total) by mouth 2 (two) times daily. 05/29/18 06/28/18  Johnson, Clanford L, MD  topiramate (TOPAMAX) 25 MG tablet Take 1 tablet (25 mg total) by mouth See admin instructions. Take one in the morning and 2 in the evening 05/29/18   Johnson, Clanford L, MD  triamcinolone ointment (KENALOG) 0.5 % Apply 1 application topically 2 (two) times daily. 06/26/18   Noemi Chapel, MD  Vitamin D, Ergocalciferol, (DRISDOL) 50000 units CAPS capsule Take 50,000 Units by mouth every 7 (seven) days. Take 1 tablet (50,000 units) by mouth every Thursday    [provider]    Family History Family History  Problem Relation Age of Onset  . Hypertension Mother   . Autoimmune disease Mother        lichen planus  . Eczema Mother   . Parkinson's disease Mother   . Mental illness Mother        bipolar  . Heart disease Maternal Aunt   . Lung cancer Paternal Aunt   . Breast cancer Paternal Aunt        dx in her 36s  . Lung cancer Paternal Grandfather   .  Colon cancer Paternal Uncle        dx in her 84s  . Colon cancer Paternal Uncle        dx in his 95s  . Melanoma Father 68  . Psoriasis Father   . Hypertension Father   . Heart disease Father   . Breast cancer Sister        dx in her 16s  . Lupus Sister   . COPD Maternal Uncle   . Colon cancer Paternal Uncle        dx in his 56s  . Mental illness Son        bipolar    Social History Social History   Tobacco Use  . Smoking status: Never Smoker  . Smokeless tobacco: Never Used  Substance Use Topics  . Alcohol use: Never    Frequency: Never    Comment: maybe one a year  . Drug use: Never     Allergies   Ramipril; Shrimp [shellfish allergy]; Minocycline hcl; Altace [ramipril]; Lyrica [pregabalin]; Adhesive [tape]; Bactrim [sulfamethoxazole-trimethoprim]; Drixoral [brompheniramine-pseudoeph]; Erythromycin; Minocin [minocycline hcl]; Septra [sulfamethoxazole-trimethoprim]; and Sinutab [chlorphen-pseudoephed-apap]   Review of Systems Review of Systems  Constitutional: Negative for fever.  Gastrointestinal: Negative for abdominal pain, nausea and vomiting.  Skin: Positive for rash.     Physical Exam Updated Vital Signs BP 129/80 (BP Location: Left Arm)   Pulse (!) 115   Temp 98.2 F (36.8 C) (Oral)   Resp (!) 22   SpO2 96%   Physical Exam  Constitutional: She appears well-developed and well-nourished.  HENT:  Head: Normocephalic and atraumatic.  Eyes: Conjunctivae are normal. Right eye exhibits no discharge. Left eye exhibits no discharge.  Pulmonary/Chest: Effort normal. No respiratory distress.  Abdominal:  The abdomen is very soft and nontender  Neurological: She is alert. Coordination normal.  Skin: Skin is warm and dry. Rash noted. She is not diaphoretic. There is erythema.  The incisions are surrounded by an erythematous ring with a papular border, these are pruritic, there is no petechiae or purpura, there is no induration or warmth  Psychiatric: She has  a normal mood and affect.  Nursing note and vitals reviewed.    ED Treatments / Results  Labs (all labs ordered are listed, but only abnormal results are displayed) Labs Reviewed - No data to display  EKG None  Radiology No results found.  Procedures Procedures (including critical care time)  Medications Ordered in ED Medications  predniSONE (DELTASONE) tablet 40 mg (has no administration in time range)     Initial Impression / Assessment and Plan / ED Course  I have reviewed the triage vital signs and the nursing notes.  Pertinent labs & imaging results that were available during my care of the patient were reviewed by me and considered in my medical decision making (see chart for details).     Exam is consistent with a topical contact dermatitis likely secondary to the tissue adhesive.  She will need triamcinolone cream, likely some prednisone.  Otherwise these lesions do not look like cellulitis and it would be exceedingly unlikely for her to develop cellulitis around each of the wounds especially given that these rashes are consistent with a contact dermatitis in appearance.  Final Clinical Impressions(s) / ED Diagnoses   Final diagnoses:  Irritant contact dermatitis due to drug in contact with skin    ED Discharge Orders         Ordered    triamcinolone ointment (KENALOG) 0.5 %  2 times daily     06/26/18 2029    predniSONE (DELTASONE) 20 MG tablet  Daily     06/26/18 2029           Noemi Chapel, MD 06/26/18 2029

## 2018-06-27 NOTE — Discharge Summary (Signed)
Date of admission: 06/19/2018  Date of discharge: 06/27/2018  Admission diagnosis: left kidney cancer  Discharge diagnosis: same  Secondary diagnoses:  Patient Active Problem List   Diagnosis Date Noted  . Renal mass, left 06/19/2018  . Clostridium difficile diarrhea   . Prolonged Q-T interval on ECG   . Weakness   . Hypokalemia, gastrointestinal losses 05/25/2018  . Hypokalemia 05/24/2018  . Dizziness 10/29/2017  . Gait abnormality 10/29/2017  . Tremor, essential 10/29/2017  . Diplopia 10/29/2017  . Obstructive sleep apnea on CPAP 12/10/2016  . Plantar fasciitis, bilateral 12/10/2016  . Fibromyalgia 11/09/2016  . IBS (irritable bowel syndrome) 11/09/2016  . GERD (gastroesophageal reflux disease) 11/09/2016  . Anxiety and depression 11/09/2016  . MGUS (monoclonal gammopathy of unknown significance) 03/15/2016  . Breast cancer of lower-inner quadrant of right female breast ([place]) 08/26/2015  . Headache 07/17/2015  . Hypertension 07/17/2015  . Obesity (BMI 35.0-39.9 without comorbidity) 07/17/2015    Procedures performed: Procedure(s): XI ROBOTIC ASSITED PARTIAL NEPHRECTOMY  History and Physical: For full details, please see admission history and physical. Briefly, Sarah Cooley is a 52 y.o. year old patient with left renal mass.   Hospital Course: Patient tolerated the procedure well.  She was then transferred to the floor after an uneventful PACU stay.  Her hospital course was uncomplicated.  On POD#1 she had met discharge criteria: was eating a regular diet, was up and ambulating independently,  pain was well controlled, was voiding without a catheter, and was ready to for discharge.   Laboratory values:  No results for input(s): WBC, HGB, HCT in the last 72 hours. No results for input(s): NA, K, CL, CO2, GLUCOSE, BUN, CREATININE, CALCIUM in the last 72 hours. No results for input(s): LABPT, INR in the last 72 hours. No results for input(s): LABURIN in the last 72  hours. Results for orders placed or performed during the hospital encounter of 05/24/18  C difficile quick scan w PCR reflex     Status: Abnormal   Collection Time: 05/26/18  9:45 AM  Result Value Ref Range Status   C Diff antigen POSITIVE (A) NEGATIVE Final   C Diff toxin NEGATIVE NEGATIVE Final   C Diff interpretation Results are indeterminate. See PCR results.  Final    Comment: Performed at [facility], [address], [place], [place] [address]  Giardia/Cryptosporidium EIA     Status: None   Collection Time: 05/26/18  9:45 AM  Result Value Ref Range Status   Giardia Ag, Stl Negative Negative Final   Cryptosporidium EIA Negative Negative Final    Comment: (NOTE) Performed At: [facility] [place], [place] [id number] Rush Farmer MD [id number]    Source of Sample STOOL  Final    Comment: Performed at [facility], [address] [place], [place] [address]  C. Diff by PCR, Reflexed     Status: Abnormal   Collection Time: 05/26/18  9:45 AM  Result Value Ref Range Status   Toxigenic C. Difficile by PCR POSITIVE (A) NEGATIVE Final    Comment: Positive for toxigenic C. difficile with little to no toxin production. Only treat if clinical presentation suggests symptomatic illness. Performed at [facility], [place] [address] [place], [place] [address]   Gastrointestinal Panel by PCR , Stool     Status: None   Collection Time: 05/27/18  2:00 PM  Result Value Ref Range Status   Campylobacter species NOT DETECTED NOT DETECTED Final   Plesimonas shigelloides NOT DETECTED  NOT DETECTED Final   Salmonella species NOT DETECTED NOT DETECTED Final   Yersinia enterocolitica NOT DETECTED NOT DETECTED Final   Vibrio species NOT DETECTED NOT DETECTED Final   Vibrio cholerae NOT DETECTED NOT DETECTED Final   Enteroaggregative E coli (EAEC) NOT DETECTED NOT DETECTED Final   Enteropathogenic E coli (EPEC) NOT DETECTED NOT DETECTED Final   Enterotoxigenic E coli  (ETEC) NOT DETECTED NOT DETECTED Final   Shiga like toxin producing E coli (STEC) NOT DETECTED NOT DETECTED Final   Shigella/Enteroinvasive E coli (EIEC) NOT DETECTED NOT DETECTED Final   Cryptosporidium NOT DETECTED NOT DETECTED Final   Cyclospora cayetanensis NOT DETECTED NOT DETECTED Final   Entamoeba histolytica NOT DETECTED NOT DETECTED Final   Giardia lamblia NOT DETECTED NOT DETECTED Final   Adenovirus F40/41 NOT DETECTED NOT DETECTED Final   Astrovirus NOT DETECTED NOT DETECTED Final   Norovirus GI/GII NOT DETECTED NOT DETECTED Final   Rotavirus A NOT DETECTED NOT DETECTED Final   Sapovirus (I, II, IV, and V) NOT DETECTED NOT DETECTED Final    Comment: Performed at [facility], [address] [place], [place] [address]    Disposition: Home  Discharge instruction: The patient was instructed to be ambulatory but told to refrain from heavy lifting, strenuous activity, or driving.   Discharge medications:  Allergies as of 06/20/2018      Reactions   Ramipril Nausea And Vomiting, Other (See Comments)   Ramipril--Caused Pancreatitis   Shrimp [shellfish Allergy] Anaphylaxis, Swelling   Minocycline Hcl Hives   Altace [ramipril] Nausea And Vomiting, Nausea Only   Lyrica [pregabalin] Nausea And Vomiting   Adhesive [tape] Rash   Please use paper tape Patient is allergic to many adhesives    Bactrim [sulfamethoxazole-trimethoprim] Other (See Comments)   May have caused kidney issues   Drixoral [brompheniramine-pseudoeph] Rash   Erythromycin Other (See Comments)   GI- Upset / severe abdominal pain   Minocin [minocycline Hcl] Nausea And Vomiting, Rash   Septra [sulfamethoxazole-trimethoprim] Rash   Sinutab [chlorphen-pseudoephed-apap] Rash      Medication List    STOP taking these medications   traMADol 50 MG tablet Commonly known as:  ULTRAM     TAKE these medications   ALPRAZolam 0.5 MG tablet Commonly known as:  XANAX Take 0.5 mg by mouth.   calcium  carbonate 500 MG chewable tablet Commonly known as:  TUMS - dosed in mg elemental calcium Chew 1 tablet (200 mg of elemental calcium total) by mouth 2 (two) times daily with breakfast and lunch.   dicyclomine 20 MG tablet Commonly known as:  BENTYL Take 20 mg by mouth every 6 (six) hours.   FLUoxetine HCl 60 MG Tabs Take 60 mg by mouth daily.   furosemide 20 MG tablet Commonly known as:  LASIX Take 1 tablet (20 mg total) by mouth daily as needed for edema.   losartan 25 MG tablet Commonly known as:  COZAAR Take 25 mg by mouth daily.   OVER THE COUNTER MEDICATION Take 1 capsule by mouth daily as needed. Diphenhydramate Over The Counter Anti-emetic   oxyCODONE 5 MG immediate release tablet Commonly known as:  Oxy IR/ROXICODONE Take 1-2 tablets (5-10 mg total) by mouth every 4 (four) hours as needed.   potassium chloride SA 20 MEQ tablet Commonly known as:  K-DUR,KLOR-CON TAKE 20 MEQ (1 TABLET) AS NEEDED ON DAYS YOU TAKE THE LASIX.   pravastatin 10 MG tablet Commonly known as:  PRAVACHOL Take 10 mg by mouth daily.  saccharomyces boulardii 250 MG capsule Commonly known as:  FLORASTOR Take 1 capsule (250 mg total) by mouth 2 (two) times daily.   topiramate 25 MG tablet Commonly known as:  TOPAMAX Take 1 tablet (25 mg total) by mouth See admin instructions. Take one in the morning and 2 in the evening   Vitamin D (Ergocalciferol) 50000 units Caps capsule Commonly known as:  DRISDOL Take 50,000 Units by mouth every 7 (seven) days. Take 1 tablet (50,000 units) by mouth every Thursday       Followup:  Follow-up Information    Ardis Hughs, MD On 07/03/2018.   Specialty:  Urology Why:  3pm Contact information: [place] [address] [phone]

## 2018-07-03 DIAGNOSIS — C642 Malignant neoplasm of left kidney, except renal pelvis: Secondary | ICD-10-CM | POA: Diagnosis not present

## 2018-07-12 DIAGNOSIS — Z6833 Body mass index (BMI) 33.0-33.9, adult: Secondary | ICD-10-CM | POA: Diagnosis not present

## 2018-07-12 DIAGNOSIS — M79602 Pain in left arm: Secondary | ICD-10-CM | POA: Diagnosis not present

## 2018-07-12 DIAGNOSIS — I10 Essential (primary) hypertension: Secondary | ICD-10-CM | POA: Diagnosis not present

## 2018-07-21 DIAGNOSIS — M25522 Pain in left elbow: Secondary | ICD-10-CM | POA: Diagnosis not present

## 2018-07-23 ENCOUNTER — Ambulatory Visit: Payer: BLUE CROSS/BLUE SHIELD | Admitting: Neurology

## 2018-07-23 ENCOUNTER — Encounter: Payer: Self-pay | Admitting: Neurology

## 2018-07-23 ENCOUNTER — Telehealth: Payer: Self-pay | Admitting: Neurology

## 2018-07-23 VITALS — BP 110/75 | HR 89 | Ht 68.25 in | Wt 221.0 lb

## 2018-07-23 DIAGNOSIS — M545 Low back pain, unspecified: Secondary | ICD-10-CM

## 2018-07-23 DIAGNOSIS — R51 Headache: Secondary | ICD-10-CM | POA: Diagnosis not present

## 2018-07-23 DIAGNOSIS — R519 Headache, unspecified: Secondary | ICD-10-CM

## 2018-07-23 DIAGNOSIS — M797 Fibromyalgia: Secondary | ICD-10-CM

## 2018-07-23 DIAGNOSIS — G8929 Other chronic pain: Secondary | ICD-10-CM

## 2018-07-23 MED ORDER — TRAMADOL HCL 50 MG PO TABS: 50.0000 mg | ORAL_TABLET | Freq: Four times a day (QID) | ORAL | 1 refills | Status: DC | PRN

## 2018-07-23 MED ORDER — PRAMIPEXOLE DIHYDROCHLORIDE 0.125 MG PO TABS: ORAL_TABLET | ORAL | 3 refills | Status: DC

## 2018-07-23 NOTE — Telephone Encounter (Signed)
Called Diane back. She requested dx code for rx. Provided her the clinical info. Nothing further needed.

## 2018-07-23 NOTE — Telephone Encounter (Signed)
Diane with [facility] requesting a call to discuss traMADol (ULTRAM) 50 MG tablet, please advise at [phone]

## 2018-07-23 NOTE — Progress Notes (Signed)
Reason for visit: Headache, low back pain  Sarah Cooley is an 52 y.o. female  History of present illness:  Sarah Cooley is a 52 year old right-handed white female with a history of multiple somatic complaints.  The patient has a history of fibromyalgia, she was just admitted to the hospital on 19 June 2018 for resection of a renal cell carcinoma from the kidney.  The patient was admitted on 24 May 2018 with severe hypokalemia.  She was taken off of her thiazide diuretic at that time.  Since the surgery for the renal cell carcinoma, the patient has developed back pain and pain down both legs and tingling sensations in the feet.  The left leg discomfort is more severe.  The patient denies any weakness of the legs, she has had a small change in bladder function but otherwise the bowels are working well.  The patient has not had a dramatic change in balance, she has not had any falls.  She continues to have episodes of jerks in the arms and legs that occur when she is inactive, they do not occur when she is active.  She may have nocturnal myoclonus as she transitions into sleep, she has periodic limb movements throughout the night.  The patient also has frequent headaches, she has had 2 severe headaches since surgery.  She does not believe that the Topamax has helped her headaches much and have not helped her tremors much.  The patient continues to have tremors of both arms.  The patient also reports double vision, she wears prisms in her glasses for this.  This is a chronic issue.  In the past, the patient has had nerve conduction studies done in 2016, no evidence of a neuropathy was seen at that time.  The patient comes to this office for an evaluation.  Past Medical History:  Diagnosis Date  . Allergy    seasonal  . Anxiety   . Arthritis   . Balance problem   . Brain aneurysm    2 mm ACA region aneurysm by 09/21/15 MRA  . Breast cancer ([place])   . Breast cancer of lower-inner quadrant of  right female breast ([place]) 08/26/2015  . Chronic kidney disease    acute renal failure one occasion  . Complication of anesthesia    heart rate drops and O2 sats drop  . Constipation   . Depression   . Diplopia 10/29/2017  . Dizziness 10/29/2017  . Elevated liver function tests   . Family history of adverse reaction to anesthesia    mom has n/v  . Family history of breast cancer   . Family history of colon cancer   . Fibromyalgia   . Fibromyalgia   . Gait abnormality 10/29/2017  . Genetic testing 09/08/2015   Negative genetic testing on the Breast/High Moderate Risk panel. The Breast High/Moderate Risk gene panel offered by [facility] includes sequencing and deletion/duplication analysis of the following 9 genes: ATM, BRCA1, BRCA2, CDH1, CHEK2, PALB2, PTEN, STK11, and TP53. The report date is September 08, 2015.  The Rest of the Comprehensive Cancer panel has been reflexed and will be available in 2-3 weeks.  POLD1 c.208G>T VUS found on the Remainder of the Comprehensive cancer panel.  The Comprehensive Cancer Panel offered by [facility] includes sequencing and/or deletion duplication testing of the following 32 genes: APC, ATM, AXIN2, BARD1, BMPR1A, BRCA1, BRCA2, BRIP1, CDH1, CDK4, CDKN2A, CHEK2, EPCAM, FANCC, MLH1, MSH2, MSH6, MUTYH, NBN, PALB2, PMS2, POLD1, POLE, PTEN, RAD51C, RAD51D, SCG5/GREM1,  SMAD4, STK11, TP53, VHL, and XRCC2.   The report date is 09/12/2015.   Marland Kitchen GERD (gastroesophageal reflux disease)    none recent  . H/O acute renal failure 09/22/15   admitted to [facility]  . Headache    occipital and temporal  . History of hiatal hernia   . Hyperlipemia   . Hyperlipidemia   . Hypertension   . Irritable bowel syndrome (IBS)   . MGUS (monoclonal gammopathy of unknown significance) 03/15/2016  . Neuromuscular disorder ([facility])    fibromyalgia  . Occasional tremors   . OSA (obstructive sleep apnea)    uses cpap with humidification- auto   . Personal history of radiation therapy   . PONV  (postoperative nausea and vomiting)   . Renal cell carcinoma ([place])   . Tremor, essential 10/29/2017    Past Surgical History:  Procedure Laterality Date  . ABDOMINAL HYSTERECTOMY     adenomyosis  . ABDOMINAL HYSTERECTOMY    . BLADDER SURGERY    . BONE GRAFT HIP ILIAC CREST    . BREAST BIOPSY Bilateral   . BREAST LUMPECTOMY    . BREAST SURGERY    . CHOLECYSTECTOMY    . FRACTURE SURGERY Right    wrist  . KNEE ARTHROSCOPY    . KNEE ARTHROSCOPY W/ ACL RECONSTRUCTION     right  . RADIOACTIVE SEED GUIDED PARTIAL MASTECTOMY WITH AXILLARY SENTINEL LYMPH NODE BIOPSY Right 10/13/2015   Procedure: RADIOACTIVE SEED GUIDED PARTIAL MASTECTOMY WITH AXILLARY SENTINEL LYMPH NODE BIOPSY;  Surgeon: Erroll Luna, MD;  Location: [facility];  Service: General;  Laterality: Right;  . right carpal and cubital tunnel release     . ROBOTIC ASSITED PARTIAL NEPHRECTOMY Left 06/19/2018   Procedure: XI ROBOTIC ASSITED PARTIAL NEPHRECTOMY;  Surgeon: Ardis Hughs, MD;  Location: [facility];  Service: Urology;  Laterality: Left;  . WRIST SURGERY      Family History  Problem Relation Age of Onset  . Hypertension Mother   . Autoimmune disease Mother        lichen planus  . Eczema Mother   . Parkinson's disease Mother   . Mental illness Mother        bipolar  . Heart disease Maternal Aunt   . Lung cancer Paternal Aunt   . Breast cancer Paternal Aunt        dx in her 29s  . Lung cancer Paternal Grandfather   . Colon cancer Paternal Uncle        dx in her 70s  . Colon cancer Paternal Uncle        dx in his 76s  . Melanoma Father 43  . Psoriasis Father   . Hypertension Father   . Heart disease Father   . Breast cancer Sister        dx in her 8s  . Lupus Sister   . COPD Maternal Uncle   . Colon cancer Paternal Uncle        dx in his 61s  . Mental illness Son        bipolar    Social history:  reports that she has never smoked. She has never used smokeless tobacco. She reports that she does not  drink alcohol or use drugs.    Allergies  Allergen Reactions  . Ramipril Nausea And Vomiting and Other (See Comments)    Ramipril--Caused Pancreatitis  . Shrimp [Shellfish Allergy] Anaphylaxis and Swelling  . Minocycline Hcl Hives  . Altace [Ramipril] Nausea And Vomiting and  Nausea Only  . Lyrica [Pregabalin] Nausea And Vomiting  . Other     ADHESIVE GLUE USED FOR INCISIONS  . Adhesive [Tape] Rash    Please use paper tape Patient is allergic to many adhesives   . Bactrim [Sulfamethoxazole-Trimethoprim] Other (See Comments)    May have caused kidney issues  . Drixoral [Brompheniramine-Pseudoeph] Rash  . Erythromycin Other (See Comments)    GI- Upset / severe abdominal pain  . Minocin [Minocycline Hcl] Nausea And Vomiting and Rash  . Septra [Sulfamethoxazole-Trimethoprim] Rash  . Sinutab [Chlorphen-Pseudoephed-Apap] Rash    Medications:  Prior to Admission medications   Medication Sig Start Date End Date Taking? Authorizing Provider  calcium carbonate (TUMS - DOSED IN MG ELEMENTAL CALCIUM) 500 MG chewable tablet Chew 1 tablet (200 mg of elemental calcium total) by mouth 2 (two) times daily with breakfast and lunch. 05/29/18  Yes Johnson, Clanford L, MD  dicyclomine (BENTYL) 20 MG tablet Take 20 mg by mouth as needed.    Yes [provider]  FLUoxetine (PROZAC) 20 MG capsule Take 60 mg by mouth daily. 06/19/18  Yes [provider]  furosemide (LASIX) 20 MG tablet Take 1 tablet (20 mg total) by mouth daily as needed for edema. 06/11/18 09/09/18 Yes BranchAlphonse Guild, MD  losartan (COZAAR) 25 MG tablet Take 25 mg by mouth daily. 06/10/18  Yes [provider]  OVER THE COUNTER MEDICATION Take 1 capsule by mouth daily as needed. Diphenhydramate Over The Counter Anti-emetic   Yes [provider]  oxyCODONE (ROXICODONE) 5 MG immediate release tablet Take 1-2 tablets (5-10 mg total) by mouth every 4 (four) hours as needed. 06/20/18  Yes Ardis Hughs, MD    pravastatin (PRAVACHOL) 10 MG tablet Take 10 mg by mouth daily.   Yes [provider]  topiramate (TOPAMAX) 25 MG tablet Take 1 tablet (25 mg total) by mouth See admin instructions. Take one in the morning and 2 in the evening 05/29/18  Yes Johnson, Clanford L, MD  Vitamin D, Ergocalciferol, (DRISDOL) 50000 units CAPS capsule Take 50,000 Units by mouth every 7 (seven) days. Take 1 tablet (50,000 units) by mouth every Thursday   Yes [provider]  pramipexole (MIRAPEX) 0.125 MG tablet One tablet at dinnertime and 2 at bedtime 07/23/18   Kathrynn Ducking, MD  traMADol (ULTRAM) 50 MG tablet Take 1 tablet (50 mg total) by mouth every 6 (six) hours as needed. 07/23/18   Kathrynn Ducking, MD    ROS:  Out of a complete 14 system review of symptoms, the patient complains only of the following symptoms, and all other reviewed systems are negative.  Decreased activity, appetite change, chills, fatigue, weight loss, 31 pounds Hearing loss, ear pain, ringing in the ears, runny nose Light sensitivity, double vision, loss of vision, eye pain, blurred vision Shortness of breath, chest tightness Chest pain, palpitations of the heart Cold intolerance, heat intolerance, flushing Swollen abdomen, constipation, nausea Restless legs, insomnia, sleep apnea Environmental allergies Decreased urine output Joint pain, back pain, aching muscles, muscle cramps, walking difficulty, neck pain, neck stiffness Skin wounds, rash, itching Swollen lymph nodes, easy bruising Dizziness, headache, numbness, weakness, tremors Decreased concentration, depression, anxiety  Blood pressure 110/75, pulse 89, height 5' 8.25" (1.734 m), weight 221 lb (100.2 kg), SpO2 98 %.  Physical Exam  General: The patient is alert and cooperative at the time of the examination.  The patient is markedly obese.  Neuromuscular: The patient has fair range move the low  back, she reports pain with flexion.  Skin: No  significant peripheral edema is noted.   Neurologic Exam  Mental status: The patient is alert and oriented x 3 at the time of the examination. The patient has apparent normal recent and remote memory, with an apparently normal attention span and concentration ability.   Cranial nerves: Facial symmetry is present. Speech is normal, no aphasia or dysarthria is noted. Extraocular movements are full. Visual fields are full.  Motor: The patient has good strength in all 4 extremities.  Sensory examination: Soft touch sensation is symmetric on the face, arms, and legs.  Coordination: The patient has good finger-nose-finger and heel-to-shin bilaterally.  Gait and station: The patient has a normal gait. Tandem gait is normal. Romberg is negative. No drift is seen.  Reflexes: Deep tendon reflexes are symmetric, the patient has well-maintained symmetric reflexes in the knees and ankles bilaterally.   Assessment/Plan:  1.  Fibromyalgia  2.  Back pain and leg pain following surgery  3.  Reports of double vision  4.  Chronic daily headache  5.  Tremor  6.  Probable restless leg syndrome, periodic limb movements, nocturnal myoclonus  The patient is on Prozac which may exacerbate periodic limb movements and restless legs.  The patient will be placed on low-dose Mirapex taking 0.125 mg around dinnertime and 2 tablets at bedtime.  The patient was given a prescription for Ultram for pain, this will be used only temporarily until EMG nerve conduction study can be done.  EMG will be done on the left leg, nerve conduction studies on both legs.  The patient will be set up for physical therapy for the low back.  The patient will follow-up otherwise in 4 months.  In the past, the patient has been on gabapentin and Lyrica, this did not benefit her back pain.  We may consider MRI of the low back in the future.  Jill Alexanders MD 07/23/2018 9:03 AM  [facility] [address]  [place], [place] [address]  Phone [phone] Fax [phone]

## 2018-07-24 ENCOUNTER — Inpatient Hospital Stay (HOSPITAL_COMMUNITY): Payer: BLUE CROSS/BLUE SHIELD | Attending: Hematology

## 2018-07-24 DIAGNOSIS — Z803 Family history of malignant neoplasm of breast: Secondary | ICD-10-CM | POA: Diagnosis not present

## 2018-07-24 DIAGNOSIS — M25559 Pain in unspecified hip: Secondary | ICD-10-CM | POA: Insufficient documentation

## 2018-07-24 DIAGNOSIS — D472 Monoclonal gammopathy: Secondary | ICD-10-CM | POA: Insufficient documentation

## 2018-07-24 DIAGNOSIS — B9689 Other specified bacterial agents as the cause of diseases classified elsewhere: Secondary | ICD-10-CM | POA: Insufficient documentation

## 2018-07-24 DIAGNOSIS — R0602 Shortness of breath: Secondary | ICD-10-CM | POA: Insufficient documentation

## 2018-07-24 DIAGNOSIS — D0511 Intraductal carcinoma in situ of right breast: Secondary | ICD-10-CM | POA: Insufficient documentation

## 2018-07-24 DIAGNOSIS — Z801 Family history of malignant neoplasm of trachea, bronchus and lung: Secondary | ICD-10-CM | POA: Diagnosis not present

## 2018-07-24 DIAGNOSIS — Z808 Family history of malignant neoplasm of other organs or systems: Secondary | ICD-10-CM | POA: Diagnosis not present

## 2018-07-24 DIAGNOSIS — Z171 Estrogen receptor negative status [ER-]: Secondary | ICD-10-CM | POA: Diagnosis not present

## 2018-07-24 DIAGNOSIS — Z800 Family history of malignant neoplasm of digestive organs: Secondary | ICD-10-CM | POA: Insufficient documentation

## 2018-07-24 LAB — CBC WITH DIFFERENTIAL/PLATELET
Basophils Absolute: 0 10*3/uL (ref 0.0–0.1)
Basophils Relative: 0 %
EOS PCT: 2 %
Eosinophils Absolute: 0.2 10*3/uL (ref 0.0–0.7)
HEMATOCRIT: 39.8 % (ref 36.0–46.0)
HEMOGLOBIN: 13.5 g/dL (ref 12.0–15.0)
LYMPHS ABS: 1.8 10*3/uL (ref 0.7–4.0)
LYMPHS PCT: 27 %
MCH: 31.5 pg (ref 26.0–34.0)
MCHC: 33.9 g/dL (ref 30.0–36.0)
MCV: 93 fL (ref 78.0–100.0)
Monocytes Absolute: 0.6 10*3/uL (ref 0.1–1.0)
Monocytes Relative: 9 %
NEUTROS PCT: 62 %
Neutro Abs: 4.2 10*3/uL (ref 1.7–7.7)
Platelets: 214 10*3/uL (ref 150–400)
RBC: 4.28 MIL/uL (ref 3.87–5.11)
RDW: 13.4 % (ref 11.5–15.5)
WBC: 6.8 10*3/uL (ref 4.0–10.5)

## 2018-07-24 LAB — COMPREHENSIVE METABOLIC PANEL
ALBUMIN: 4.1 g/dL (ref 3.5–5.0)
ALT: 24 U/L (ref 0–44)
AST: 18 U/L (ref 15–41)
Alkaline Phosphatase: 61 U/L (ref 38–126)
Anion gap: 8 (ref 5–15)
BUN: 14 mg/dL (ref 6–20)
CHLORIDE: 109 mmol/L (ref 98–111)
CO2: 22 mmol/L (ref 22–32)
Calcium: 9 mg/dL (ref 8.9–10.3)
Creatinine, Ser: 0.77 mg/dL (ref 0.44–1.00)
GFR calc Af Amer: >60 mL/min (ref >60)
GFR calc non Af Amer: >60 mL/min (ref >60)
GLUCOSE: 129 mg/dL — AB (ref 70–99)
POTASSIUM: 3.4 mmol/L — AB (ref 3.5–5.1)
Sodium: 139 mmol/L (ref 135–145)
Total Bilirubin: 0.6 mg/dL (ref 0.3–1.2)
Total Protein: 7.7 g/dL (ref 6.5–8.1)

## 2018-07-24 LAB — FERRITIN: Ferritin: 67 ng/mL (ref 11–307)

## 2018-07-24 LAB — LACTATE DEHYDROGENASE: LDH: 152 U/L (ref 98–192)

## 2018-07-25 ENCOUNTER — Encounter (HOSPITAL_COMMUNITY): Payer: Self-pay

## 2018-07-25 ENCOUNTER — Telehealth (HOSPITAL_COMMUNITY): Payer: Self-pay | Admitting: *Deleted

## 2018-07-25 DIAGNOSIS — M79662 Pain in left lower leg: Secondary | ICD-10-CM | POA: Diagnosis not present

## 2018-07-25 LAB — IGG, IGA, IGM
IGM (IMMUNOGLOBULIN M), SRM: 72 mg/dL (ref 26–217)
IgA: 470 mg/dL — ABNORMAL HIGH (ref 87–352)
IgG (Immunoglobin G), Serum: 966 mg/dL (ref 700–1600)

## 2018-07-25 LAB — PROTEIN ELECTROPHORESIS, SERUM
A/G Ratio: 1.2 (ref 0.7–1.7)
ALBUMIN ELP: 3.7 g/dL (ref 2.9–4.4)
ALPHA-1-GLOBULIN: 0.3 g/dL (ref 0.0–0.4)
Alpha-2-Globulin: 0.7 g/dL (ref 0.4–1.0)
Beta Globulin: 1.2 g/dL (ref 0.7–1.3)
GAMMA GLOBULIN: 0.9 g/dL (ref 0.4–1.8)
GLOBULIN, TOTAL: 3.2 g/dL (ref 2.2–3.9)
TOTAL PROTEIN ELP: 6.9 g/dL (ref 6.0–8.5)

## 2018-07-25 LAB — KAPPA/LAMBDA LIGHT CHAINS
KAPPA FREE LGHT CHN: 27 mg/L — AB (ref 3.3–19.4)
KAPPA, LAMDA LIGHT CHAIN RATIO: 1.64 (ref 0.26–1.65)
Lambda free light chains: 16.5 mg/L (ref 5.7–26.3)

## 2018-07-25 LAB — BETA 2 MICROGLOBULIN, SERUM: BETA 2 MICROGLOBULIN: 1.3 mg/L (ref 0.6–2.4)

## 2018-07-25 NOTE — Telephone Encounter (Signed)
Pt aware of Hgb and will follow up with Dr. Walden Field on next Thursday.

## 2018-07-28 DIAGNOSIS — Z6834 Body mass index (BMI) 34.0-34.9, adult: Secondary | ICD-10-CM | POA: Diagnosis not present

## 2018-07-28 DIAGNOSIS — R1032 Left lower quadrant pain: Secondary | ICD-10-CM | POA: Diagnosis not present

## 2018-07-28 DIAGNOSIS — C649 Malignant neoplasm of unspecified kidney, except renal pelvis: Secondary | ICD-10-CM | POA: Diagnosis not present

## 2018-07-28 DIAGNOSIS — R197 Diarrhea, unspecified: Secondary | ICD-10-CM | POA: Diagnosis not present

## 2018-07-28 DIAGNOSIS — Z6833 Body mass index (BMI) 33.0-33.9, adult: Secondary | ICD-10-CM | POA: Diagnosis not present

## 2018-07-28 DIAGNOSIS — M545 Low back pain: Secondary | ICD-10-CM | POA: Diagnosis not present

## 2018-07-31 ENCOUNTER — Inpatient Hospital Stay (HOSPITAL_BASED_OUTPATIENT_CLINIC_OR_DEPARTMENT_OTHER): Payer: BLUE CROSS/BLUE SHIELD | Admitting: Genetic Counselor

## 2018-07-31 ENCOUNTER — Encounter: Payer: Self-pay | Admitting: Genetic Counselor

## 2018-07-31 ENCOUNTER — Inpatient Hospital Stay (HOSPITAL_BASED_OUTPATIENT_CLINIC_OR_DEPARTMENT_OTHER): Payer: BLUE CROSS/BLUE SHIELD | Admitting: Internal Medicine

## 2018-07-31 ENCOUNTER — Ambulatory Visit (HOSPITAL_COMMUNITY)
Admission: RE | Admit: 2018-07-31 | Discharge: 2018-07-31 | Disposition: A | Discharge status: Home / Self Care | Payer: BLUE CROSS/BLUE SHIELD | Source: Ambulatory Visit | Attending: Internal Medicine | Admitting: Internal Medicine

## 2018-07-31 ENCOUNTER — Other Ambulatory Visit: Payer: Self-pay

## 2018-07-31 ENCOUNTER — Encounter: Payer: Self-pay | Admitting: Gastroenterology

## 2018-07-31 ENCOUNTER — Ambulatory Visit (HOSPITAL_COMMUNITY): Payer: BLUE CROSS/BLUE SHIELD | Admitting: Internal Medicine

## 2018-07-31 ENCOUNTER — Encounter (HOSPITAL_COMMUNITY): Payer: Self-pay | Admitting: Internal Medicine

## 2018-07-31 ENCOUNTER — Inpatient Hospital Stay (HOSPITAL_COMMUNITY): Payer: BLUE CROSS/BLUE SHIELD

## 2018-07-31 VITALS — BP 139/84 | HR 83 | Temp 98.3°F | Resp 16 | Wt 219.0 lb

## 2018-07-31 DIAGNOSIS — Z800 Family history of malignant neoplasm of digestive organs: Secondary | ICD-10-CM

## 2018-07-31 DIAGNOSIS — Z808 Family history of malignant neoplasm of other organs or systems: Secondary | ICD-10-CM | POA: Diagnosis not present

## 2018-07-31 DIAGNOSIS — Z171 Estrogen receptor negative status [ER-]: Secondary | ICD-10-CM

## 2018-07-31 DIAGNOSIS — B9689 Other specified bacterial agents as the cause of diseases classified elsewhere: Secondary | ICD-10-CM | POA: Diagnosis not present

## 2018-07-31 DIAGNOSIS — D0511 Intraductal carcinoma in situ of right breast: Secondary | ICD-10-CM | POA: Diagnosis not present

## 2018-07-31 DIAGNOSIS — Z801 Family history of malignant neoplasm of trachea, bronchus and lung: Secondary | ICD-10-CM

## 2018-07-31 DIAGNOSIS — R0602 Shortness of breath: Secondary | ICD-10-CM

## 2018-07-31 DIAGNOSIS — I2721 Secondary pulmonary arterial hypertension: Secondary | ICD-10-CM | POA: Insufficient documentation

## 2018-07-31 DIAGNOSIS — C50311 Malignant neoplasm of lower-inner quadrant of right female breast: Secondary | ICD-10-CM

## 2018-07-31 DIAGNOSIS — Z803 Family history of malignant neoplasm of breast: Secondary | ICD-10-CM | POA: Diagnosis not present

## 2018-07-31 DIAGNOSIS — M25559 Pain in unspecified hip: Secondary | ICD-10-CM | POA: Diagnosis not present

## 2018-07-31 DIAGNOSIS — D472 Monoclonal gammopathy: Secondary | ICD-10-CM

## 2018-07-31 DIAGNOSIS — Z853 Personal history of malignant neoplasm of breast: Secondary | ICD-10-CM | POA: Diagnosis not present

## 2018-07-31 DIAGNOSIS — C642 Malignant neoplasm of left kidney, except renal pelvis: Secondary | ICD-10-CM

## 2018-07-31 MED ORDER — IOPAMIDOL (ISOVUE-370) INJECTION 76%
100.0000 mL | Freq: Once | INTRAVENOUS | Status: AC | PRN
  Administered 2018-07-31: 100 mL via INTRAVENOUS

## 2018-07-31 NOTE — Progress Notes (Signed)
REFERRING PROVIDER: Zoila Shutter, MD [place] [address] [place], [place] [address]  PRIMARY PROVIDER:  Celene Squibb, MD  PRIMARY REASON FOR VISIT:  1. Malignant neoplasm of lower-inner quadrant of right breast of female, estrogen receptor negative ([place])   2. Family history of melanoma   3. Renal cancer, left ([place])   4. Family history of colon cancer   5. Family history of breast cancer      HISTORY OF PRESENT ILLNESS:   Sarah Cooley, a 52 y.o. female, was seen for a [facility] cancer genetics consultation at the request of Dr. Walden Field due to a personal and family history of cancer.  Sarah Cooley presents to clinic today to discuss the possibility of a hereditary predisposition to cancer, genetic testing, and to further clarify her future cancer risks, as well as potential cancer risks for family members.   In November 2016, at the age of 56, Sarah Cooley was diagnosed with DCIS of the right breast.  The tumor was ER/PR-. This was treated with lumpectomy and radiation.  At that time she had genetic testing with the comprehensive cancer panel through [facility].  The Comprehensive Common Cancer Panel offered by [facility] includes sequencing and/or deletion duplication testing of the following 32 genes: APC, ATM, AXIN2, BARD1, BMPR1A, BRCA1, BRCA2, BRIP1, CDH1, CDK4, CDKN2A, CHEK2, EPCAM, FANCC, MLH1, MSH2, MSH6, MUTYH, NBN, PALB2, PMS2, POLD1, POLE, PTEN, RAD51C, RAD51D, SCG5/GREM1, SMAD4, STK11, TP53, VHL and XRCC2.   In 2019 at the age of 24, Ms. Riding was diagnosed with clear cell, renal cell carcinoma.  This was treated with partial nephrectomy.    CANCER HISTORY:  Oncology History   Stage 0: Right breast DCIS, high-grade, ER/PR negative     Breast cancer of lower-inner quadrant of right female breast ([place])   08/19/2015 Breast US    Masslike asymmetry within the lower inner quadrant of the right breast, at posterior depth, with associated microcalcifications which are new.     08/23/2015 Initial Biopsy     AT LEAST HIGH DUCTAL CARCINOMA IN SITU    08/23/2015 Receptors her2    Estrogen Receptor: 0%, NEGATIVE Progesterone Receptor: 0%, NEGATIVE    08/26/2015 Initial Diagnosis    Breast cancer of lower-inner quadrant of right female breast ([facility])    08/31/2015 Procedure    [facility] negative.  Genes tested include: ATM, BRCA1, BRCA2, CDH1, CHEK2, PALB2, PTEN, & TP53.     10/13/2015 Pathologic Stage    pTis, pN0: Stage 0     10/13/2015 Surgery    Right lumpectomy & SLNB ([name]). High grade DCIS, 0.4 cm. Negative margins.  1 right axillary SLN neg. ER/PR repeated and both remain negative.     11/17/2015 - 12/21/2015 Radiation Therapy    Treated at [facility] Right breast: Total dose 50 Gy in 25 fractions. 3-D tangents; 6 MV photons.       HORMONAL RISK FACTORS:  Menarche was at age 65.  First live birth at age 63.  OCP use for approximately 3-4 years.  Ovaries intact: yes.  Hysterectomy: yes.  Menopausal status: perimenopausal.  HRT use: 0 years. Colonoscopy: yes; normal. Mammogram within the last year: yes. Number of breast biopsies: 1. Up to date with pelvic exams:  yes. Any excessive radiation exposure in the past:  no  Past Medical History:  Diagnosis Date  . Allergy    seasonal  . Anxiety   . Arthritis   . Balance problem   . Brain aneurysm  2 mm ACA region aneurysm by 09/21/15 MRA  . Breast cancer ([place])   . Breast cancer of lower-inner quadrant of right female breast ([place]) 08/26/2015  . Chronic kidney disease    acute renal failure one occasion  . Complication of anesthesia    heart rate drops and O2 sats drop  . Constipation   . Depression   . Diplopia 10/29/2017  . Dizziness 10/29/2017  . Elevated liver function tests   . Family history of adverse reaction to anesthesia    mom has n/v  . Family history of breast cancer   . Family history of colon cancer   . Fibromyalgia   . Fibromyalgia   . Gait abnormality 10/29/2017  . Genetic testing  09/08/2015   Negative genetic testing on the Breast/High Moderate Risk panel. The Breast High/Moderate Risk gene panel offered by [facility] includes sequencing and deletion/duplication analysis of the following 9 genes: ATM, BRCA1, BRCA2, CDH1, CHEK2, PALB2, PTEN, STK11, and TP53. The report date is September 08, 2015.  The Rest of the Comprehensive Cancer panel has been reflexed and will be available in 2-3 weeks.  POLD1 c.208G>T VUS found on the Remainder of the Comprehensive cancer panel.  The Comprehensive Cancer Panel offered by [facility] includes sequencing and/or deletion duplication testing of the following 32 genes: APC, ATM, AXIN2, BARD1, BMPR1A, BRCA1, BRCA2, BRIP1, CDH1, CDK4, CDKN2A, CHEK2, EPCAM, FANCC, MLH1, MSH2, MSH6, MUTYH, NBN, PALB2, PMS2, POLD1, POLE, PTEN, RAD51C, RAD51D, SCG5/GREM1, SMAD4, STK11, TP53, VHL, and XRCC2.   The report date is 09/12/2015.   Marland Kitchen GERD (gastroesophageal reflux disease)    none recent  . H/O acute renal failure 09/22/15   admitted to [facility]  . Headache    occipital and temporal  . History of hiatal hernia   . Hyperlipemia   . Hyperlipidemia   . Hypertension   . Irritable bowel syndrome (IBS)   . MGUS (monoclonal gammopathy of unknown significance) 03/15/2016  . Neuromuscular disorder ([facility])    fibromyalgia  . Occasional tremors   . OSA (obstructive sleep apnea)    uses cpap with humidification- auto   . Personal history of radiation therapy   . PONV (postoperative nausea and vomiting)   . Renal cell carcinoma ([place])   . Tremor, essential 10/29/2017    Past Surgical History:  Procedure Laterality Date  . ABDOMINAL HYSTERECTOMY     adenomyosis  . ABDOMINAL HYSTERECTOMY    . BLADDER SURGERY    . BONE GRAFT HIP ILIAC CREST    . BREAST BIOPSY Bilateral   . BREAST LUMPECTOMY    . BREAST SURGERY    . CHOLECYSTECTOMY    . FRACTURE SURGERY Right    wrist  . KNEE ARTHROSCOPY    . KNEE ARTHROSCOPY W/ ACL RECONSTRUCTION     right  . RADIOACTIVE  SEED GUIDED PARTIAL MASTECTOMY WITH AXILLARY SENTINEL LYMPH NODE BIOPSY Right 10/13/2015   Procedure: RADIOACTIVE SEED GUIDED PARTIAL MASTECTOMY WITH AXILLARY SENTINEL LYMPH NODE BIOPSY;  Surgeon: Erroll Luna, MD;  Location: [facility];  Service: General;  Laterality: Right;  . right carpal and cubital tunnel release     . ROBOTIC ASSITED PARTIAL NEPHRECTOMY Left 06/19/2018   Procedure: XI ROBOTIC ASSITED PARTIAL NEPHRECTOMY;  Surgeon: Ardis Hughs, MD;  Location: [facility];  Service: Urology;  Laterality: Left;  . WRIST SURGERY      Social History   Socioeconomic History  . Marital status: Married    Spouse name: Psychologist, clinical  . Number of children:  2  . Years of education: 53  . Highest education level: Not on file  Occupational History  . Occupation: physician office  Social Needs  . Financial resource strain: Not on file  . Food insecurity:    Worry: Not on file    Inability: Not on file  . Transportation needs:    Medical: Not on file    Non-medical: Not on file  Tobacco Use  . Smoking status: Never Smoker  . Smokeless tobacco: Never Used  Substance and Sexual Activity  . Alcohol use: Never    Frequency: Never    Comment: maybe one a year  . Drug use: Never  . Sexual activity: Yes    Birth control/protection: Surgical  Lifestyle  . Physical activity:    Days per week: Not on file    Minutes per session: Not on file  . Stress: Not on file  Relationships  . Social connections:    Talks on phone: Not on file    Gets together: Not on file    Attends religious service: Not on file    Active member of club or organization: Not on file    Attends meetings of clubs or organizations: Not on file    Relationship status: Not on file  Other Topics Concern  . Not on file  Social History Narrative   ** Merged History Encounter **       Lives with husband Husband on disability Children grown and moved worries about family illness- mother and sister Caffeine use:        FAMILY HISTORY:  We obtained a detailed, 4-generation family history.  Significant diagnoses are listed below: Family History  Problem Relation Age of Onset  . Hypertension Mother   . Autoimmune disease Mother        lichen planus  . Eczema Mother   . Parkinson's disease Mother   . Mental illness Mother        bipolar  . Heart disease Maternal Aunt   . Lung cancer Paternal Aunt   . Breast cancer Paternal Aunt        dx in his 31s  . Parkinsonism Paternal Aunt   . Lung cancer Paternal Grandfather   . Colon cancer Paternal Uncle        dx in his 11s  . Colon cancer Paternal Uncle        dx in his 54s  . Melanoma Father 100  . Psoriasis Father   . Hypertension Father   . Heart disease Father   . Breast cancer Sister        dx in her 36s  . Lupus Sister   . COPD Maternal Uncle   . Skin cancer Maternal Uncle   . Colon cancer Paternal Uncle        dx in his 65s  . Mental illness Son        bipolar  . Polycystic ovary syndrome Other     The patient has two children who are cancer free.  She has one brother and one sister.  Her sister was diagnosed with breast cancer in her late 54's.  Both parents are living.  The patient's father was diagnosed with melanoma in his late 6's.  He had 10 siblings.  Three brothers had colon cancer, a sister had breast cancer and a brother had lung cancer.  His father either had colon or lung cancer.   The patient's mother was diagnosed with Parkinson's disease.  She has  a brother and two sisters.  One sister died of a brain aneurysm.  Both maternal grandparents are deceased from non cancer related issues.  Ms. Snedeker is unaware of previous family history of genetic testing for hereditary cancer risks. Patient's maternal ancestors are of Caucasian NOS descent, and paternal ancestors are of Caucasian NOS descent. There is no reported Ashkenazi Jewish ancestry. There is no known consanguinity.  GENETIC COUNSELING ASSESSMENT: KAELEA GATHRIGHT is a  52 y.o. female with a personal and family history of cancer which is somewhat suggestive of a hereditary cancer syndrome and predisposition to cancer. We, therefore, discussed and recommended the following at today's visit.   DISCUSSION: Ms. Shoe had genetic testing in 2016 that found a POLD1 VUS but was otherwise negative testing.  Based on her early age of [place], she was referred to discuss hereditary renal cancer syndromes, specifically Von Hippel [place].  We discussed that her testing in 2016 covered the VHL gene, and it was negative at that time.  We currently have the same coverage of this gene as we did in 2016, and therefore additional testing of this gene through DNA testing will probably not identify anything additional.  However, there are other genes that can increase there risk for RCC.  Those genes include a melanoma/RCC gene called MITF, and a hereditary leiomyomatosis and renal cell carcinoma (HLRCC) gene called FH.  Both of these conditions can increase the risk for early onset RCC.    Additionally, we can look at the genes a little differently using RNA.  This allows [place] to look for gene changes that affect the gene that are deep within the intronic areas, as well as help determine if VUS that are identified are functional.  Approximately 2-4% of patients will benefit from genetic testing with RNA.  The VHL gene will be looked at with the RNA testing, as well as retested through DNA.  Based on the patient's two young onset cancers, we feel that additional testing is warranted.  We reviewed the characteristics, features and inheritance patterns of hereditary cancer syndromes. We also discussed genetic testing, including the appropriate family members to test, the process of testing, insurance coverage and turn-around-time for results. We discussed the implications of a negative, positive and/or variant of uncertain significant result. We recommended Ms. Speranza pursue genetic testing for the  CancerNext Expanded +RNA gene panel. The CancerNext-Expanded gene panel offered by [facility] and includes sequencing and rearrangement analysis for the following 67 genes: AIP, ALK, APC, ATM, BAP1, BARD1, BLM, BMPR1A, BRCA1, BRCA2, BRIP1, CDH1, CDK4, CDKN1B, CDKN2A, CHEK2, DICER1, EPCAM, FANCC, FH, FLCN, GALNT12, GREM1, HOXB13, MAX, MEN1, MET, MITF, MLH1, MRE11A, MSH2, MSH6, MUTYH, NBN, NF1, NF2, PALB2, PHOX2B, PMS2, POLD1, POLE, POT1, PRKAR1A, PTCH1, PTEN, RAD50, RAD51D, RB1, RET, SDHA, SDHAF2, SDHB, SDHC, SDHD, SMAD4, SMARCA4, SMARCB1, STK11, SUFU, TMEM127, TP53, TSC1, TSC2, VHL and XRCC2.   Based on Ms. Stacks's personal and family history of cancer, she meets medical criteria for genetic testing. Despite that she meets criteria, she may still have an out of pocket cost. We discussed that if her out of pocket cost for testing is over $100, the laboratory will call and confirm whether she wants to proceed with testing.  If the out of pocket cost of testing is less than $100 she will be billed by the genetic testing laboratory.   PLAN: After considering the risks, benefits, and limitations, Ms. Durley  provided informed consent to pursue genetic testing and the blood sample was sent  to [facility] for analysis of the [employer]	[place]. Results should be available within approximately 3-4 weeks' time, at which point they will be disclosed by telephone to Ms. Wiseman, as will any additional recommendations warranted by these results. Ms. Brereton will receive a summary of her genetic counseling visit and a copy of her results once available. This information will also be available in Epic. We encouraged Ms. Carmon to remain in contact with cancer genetics annually so that we can continuously update the family history and inform her of any changes in cancer genetics and testing that may be of benefit for her family. Ms. Gladman questions were answered to her satisfaction today. Our  contact information was provided should additional questions or concerns arise.  Lastly, we encouraged Ms. Cothran to remain in contact with cancer genetics annually so that we can continuously update the family history and inform her of any changes in cancer genetics and testing that may be of benefit for this family.   Ms.  Losier questions were answered to her satisfaction today. Our contact information was provided should additional questions or concerns arise. Thank you for the referral and allowing [place] to share in the care of your patient.   Karen P. Florene Glen, [place], [facility] Certified Genetic Counselor [email or web address] phone: [phone]  The patient was seen for a total of 45 minutes in face-to-face genetic counseling.  This patient was discussed with Drs. Magrinat, Lindi Adie and/or Burr Medico who agrees with the above.    _______________________________________________________________________ For Office Staff:  Number of people involved in session: 2 Was an Intern/ student involved with case: no

## 2018-07-31 NOTE — Progress Notes (Signed)
Diagnosis Pain in joint involving pelvic region and thigh, unspecified laterality - Plan: NM Bone Scan Whole Body, CBC with Differential/Platelet, Comprehensive metabolic panel, Lactate dehydrogenase  Shortness of breath - Plan: CT ANGIO CHEST PE W OR WO CONTRAST, NM Bone Scan Whole Body, CBC with Differential/Platelet, Comprehensive metabolic panel, Lactate dehydrogenase  Staging Cancer Staging Breast cancer of lower-inner quadrant of right female breast [facility]) Staging form: Breast, AJCC 7th Edition - Clinical stage from 08/31/2015: Stage 0 (Tis (DCIS), N0, M0) - Unsigned Staging comments: Staged at breast conference on 11.9.16 - Pathologic stage from 10/13/2015: Stage 0 (Tis (DCIS), N0, cM0) - Signed by Holley Bouche, NP on 02/09/2016   Assessment and Plan:  1.  High Grade DCIS R breast.  Pt was previously followed by Dr. Talbert Cage.  She is ER- PR- and had Negative Sentinel LN.  She underwent Right breast seed localized partial mastectomy with right axillary sentinel lymph node mapping Dr. Brantley Stage 10/13/2015.  She was treated with Adjuvant XRT.  Pt had bilateral diagnostic mammogram done 03/19/2017 that showed IMPRESSION: Expected postsurgical changes, post right breast lumpectomy and radiation therapy. No imaging evidence of malignancy.  RECOMMENDATION: Surgical consultation and contrast-enhanced breast MRI are recommended, because of patient's right nipple discharge.   She had bilateral breast MRI done 04/19/2017 that showed  IMPRESSION: 1. Enhancing mass within the upper inner quadrant of the left breast, measuring 6 mm, with suspicious washout kinetics. 2. Enhancing mass within the upper-outer quadrant of the left breast, measuring 7 mm, also with suspicious washout kinetics. 3. No evidence of malignancy within the right breast. Post lumpectomy changes within the right breast.  RECOMMENDATION: MRI-guided biopsies of the 2 suspicious enhancing masses within the left breast,  upper inner quadrant and upper outer quadrant.  Pt had biopsy done of 2 areas in left breast on 05/01/2017 with pathology returning as Diagnosis 1. Breast, left, needle core biopsy, UOQ - FIBROADENOMA. - FIBROCYSTIC CHANGE. - NO MALIGNANCY IDENTIFIED. 2. Breast, left, needle core biopsy, UIQ - FIBROCYSTIC CHANGE. - NO MALIGNANCY IDENTIFIED.  Bilateral diagnostic mammogram done 03/24/2018 was negative.  She is recommended for repeat imaging in 03/2019.  She has been seen by her surgeon Dr. Brantley Stage regarding breast pain and will continue to follow-up with his office.  Pt has been referred to genetic counseling for additional for evaluation due pt concerns about VHL.   2.  Left breast fibroadenoma.  I have discussed with her these are considered benign lesions.  Bilateral diagnostic mammogram that was done 03/24/2018 and was negative. She has been seen by Dr. Brantley Stage and will continue to follow-up with his office.    3.  T1a Nx left renal cell cancer.  Pt was seen in ER for evaluation after a fall.  She had CT CAP done 01/18/2018 that showed IMPRESSION: 1. No evidence of acute traumatic injury in the chest, abdomen or pelvis. 2. Indeterminate 2.0 cm hypodense renal cortical mass in the posterior interpolar left kidney, renal neoplasm not excluded. Recommend short-term outpatient characterization with MRI abdomen without and with IV contrast. This recommendation follows ACR consensus guidelines: Management of the Incidental Renal Mass on CT: A White Paper of the ACR Incidental Findings Committee. J Am Coll Radiol 2018;15:264-273. 3. Dilated main pulmonary artery, suggesting pulmonary arterial Hypertension.  She has been seen by [facility] and had surgery 06/19/2018.  Pathology returned as  Kidney, wedge excision / partial resection, left RENAL CELL CARCINOMA, CLEAR CELL TYPE, NUCLEAR GRADE 2 (1.5 CM) THE TUMOR IS LIMITED  TO THE KIDNEY (PT1A) ALL MARGINS OF RESECTION ARE NEGATIVE FOR  TUMOR  I have discussed with pt she has early stage 1 kidney cancer.  Natural history and prognosis related to early stage RCC was reviewed.  She will remain on surveillance.  NCCN pt guideline information was reviewed with the patient and she was provided written information.  She will continue to be followed closely and will RTC in 11/2018 for follow-up.    4.  Inability to go back to work.  Pt reports she had complications after surgery which have prevented her from being able to go back to work.  Will ask for social work to reach out to pt but she should also discuss this further with urology.    5.  SOB.  Pt reports she has a hard time breathing.  Pulse ox in clinic was 100% but decreased slightly to 97% with ambulation.   HR increased.  She will undergo CTA of the chest to evaluate for PE as she had recent surgery and she will be notified of results.    6.  Joint pain.  PT has been seen by rheumatology.  She will be set up for bone scan for further evaluation and will be notified of results.    7   C Diff.  This was diagnosed while hospitalized.  Follow-up with GI as recommended.    8.    MGUS.  SPEP done 05/23/2018 was negative.  Will repeat SPEP after surgery and if remains negative will discontinue follow-up labs.    Greater than 30 minutes spent with more than 50% spent in counseling and coordination of care.    Current Status:  Pt is seen today for follow-up to go over pathology.  She reports she had problems after surgery.  She reports a hard time breathing.  She has joint pain.   Oncology History   Stage 0: Right breast DCIS, high-grade, ER/PR negative     Breast cancer of lower-inner quadrant of right female breast ([place])   08/19/2015 Breast US    Masslike asymmetry within the lower inner quadrant of the right breast, at posterior depth, with associated microcalcifications which are new.     08/23/2015 Initial Biopsy    AT LEAST HIGH DUCTAL CARCINOMA IN SITU    08/23/2015  Receptors her2    Estrogen Receptor: 0%, NEGATIVE Progesterone Receptor: 0%, NEGATIVE    08/26/2015 Initial Diagnosis    Breast cancer of lower-inner quadrant of right female breast ([place])    08/31/2015 Procedure    [facility] negative.  Genes tested include: ATM, BRCA1, BRCA2, CDH1, CHEK2, PALB2, PTEN, & TP53.     10/13/2015 Pathologic Stage    pTis, pN0: Stage 0     10/13/2015 Surgery    Right lumpectomy & SLNB ([name]). High grade DCIS, 0.4 cm. Negative margins.  1 right axillary SLN neg. ER/PR repeated and both remain negative.     11/17/2015 - 12/21/2015 Radiation Therapy    Treated at [facility] [place]). Right breast: Total dose 50 Gy in 25 fractions. 3-D tangents; 6 MV photons.       Problem List Patient Active Problem List   Diagnosis Date Noted  . Family history of melanoma [Z80.8] 07/31/2018  . Renal cancer, left ([place]) [C64.2] 07/31/2018  . Renal mass, left [N28.89] 06/19/2018  . Clostridium difficile diarrhea [A04.72]   . Prolonged Q-T interval on ECG [R94.31]   . Weakness [R53.1]   . Hypokalemia, gastrointestinal losses [E87.6] 05/25/2018  .  Hypokalemia [E87.6] 05/24/2018  . Dizziness [R42] 10/29/2017  . Gait abnormality [R26.9] 10/29/2017  . Tremor, essential [G25.0] 10/29/2017  . Diplopia [H53.2] 10/29/2017  . Obstructive sleep apnea on CPAP [G47.33, Z99.89] 12/10/2016  . Plantar fasciitis, bilateral [M72.2] 12/10/2016  . Fibromyalgia [M79.7] 11/09/2016  . IBS (irritable bowel syndrome) [K58.9] 11/09/2016  . GERD (gastroesophageal reflux disease) [K21.9] 11/09/2016  . Anxiety and depression [F41.9, F32.9] 11/09/2016  . MGUS (monoclonal gammopathy of unknown significance) [D47.2] 03/15/2016  . Family history of breast cancer [Z80.3]   . Family history of colon cancer [Z80.0]   . Breast cancer of lower-inner quadrant of right female breast ([place]) [C50.311] 08/26/2015  . Headache [R51] 07/17/2015  . Hypertension [I10] 07/17/2015  . Obesity (BMI 35.0-39.9  without comorbidity) [E66.9] 07/17/2015    Past Medical History Past Medical History:  Diagnosis Date  . Allergy    seasonal  . Anxiety   . Arthritis   . Balance problem   . Brain aneurysm    2 mm ACA region aneurysm by 09/21/15 MRA  . Breast cancer ([place])   . Breast cancer of lower-inner quadrant of right female breast ([place]) 08/26/2015  . Chronic kidney disease    acute renal failure one occasion  . Complication of anesthesia    heart rate drops and O2 sats drop  . Constipation   . Depression   . Diplopia 10/29/2017  . Dizziness 10/29/2017  . Elevated liver function tests   . Family history of adverse reaction to anesthesia    mom has n/v  . Family history of breast cancer   . Family history of colon cancer   . Fibromyalgia   . Fibromyalgia   . Gait abnormality 10/29/2017  . Genetic testing 09/08/2015   Negative genetic testing on the Breast/High Moderate Risk panel. The Breast High/Moderate Risk gene panel offered by [facility] includes sequencing and deletion/duplication analysis of the following 9 genes: ATM, BRCA1, BRCA2, CDH1, CHEK2, PALB2, PTEN, STK11, and TP53. The report date is September 08, 2015.  The Rest of the Comprehensive Cancer panel has been reflexed and will be available in 2-3 weeks.  POLD1 c.208G>T VUS found on the Remainder of the Comprehensive cancer panel.  The Comprehensive Cancer Panel offered by [facility] includes sequencing and/or deletion duplication testing of the following 32 genes: APC, ATM, AXIN2, BARD1, BMPR1A, BRCA1, BRCA2, BRIP1, CDH1, CDK4, CDKN2A, CHEK2, EPCAM, FANCC, MLH1, MSH2, MSH6, MUTYH, NBN, PALB2, PMS2, POLD1, POLE, PTEN, RAD51C, RAD51D, SCG5/GREM1, SMAD4, STK11, TP53, VHL, and XRCC2.   The report date is 09/12/2015.   Marland Kitchen GERD (gastroesophageal reflux disease)    none recent  . H/O acute renal failure 09/22/15   admitted to [facility]  . Headache    occipital and temporal  . History of hiatal hernia   . Hyperlipemia   . Hyperlipidemia   .  Hypertension   . Irritable bowel syndrome (IBS)   . MGUS (monoclonal gammopathy of unknown significance) 03/15/2016  . Neuromuscular disorder (HCC)    fibromyalgia  . Occasional tremors   . OSA (obstructive sleep apnea)    uses cpap with humidification- auto   . Personal history of radiation therapy   . PONV (postoperative nausea and vomiting)   . Renal cell carcinoma ([place])   . Tremor, essential 10/29/2017    Past Surgical History Past Surgical History:  Procedure Laterality Date  . ABDOMINAL HYSTERECTOMY     adenomyosis  . ABDOMINAL HYSTERECTOMY    . BLADDER SURGERY    . BONE  GRAFT HIP ILIAC CREST    . BREAST BIOPSY Bilateral   . BREAST LUMPECTOMY    . BREAST SURGERY    . CHOLECYSTECTOMY    . FRACTURE SURGERY Right    wrist  . KNEE ARTHROSCOPY    . KNEE ARTHROSCOPY W/ ACL RECONSTRUCTION     right  . RADIOACTIVE SEED GUIDED PARTIAL MASTECTOMY WITH AXILLARY SENTINEL LYMPH NODE BIOPSY Right 10/13/2015   Procedure: RADIOACTIVE SEED GUIDED PARTIAL MASTECTOMY WITH AXILLARY SENTINEL LYMPH NODE BIOPSY;  Surgeon: Erroll Luna, MD;  Location: [place];  Service: General;  Laterality: Right;  . right carpal and cubital tunnel release     . ROBOTIC ASSITED PARTIAL NEPHRECTOMY Left 06/19/2018   Procedure: XI ROBOTIC ASSITED PARTIAL NEPHRECTOMY;  Surgeon: Ardis Hughs, MD;  Location: [facility];  Service: Urology;  Laterality: Left;  . WRIST SURGERY      Family History Family History  Problem Relation Age of Onset  . Hypertension Mother   . Autoimmune disease Mother        lichen planus  . Eczema Mother   . Parkinson's disease Mother   . Mental illness Mother        bipolar  . Heart disease Maternal Aunt   . Lung cancer Paternal Aunt   . Breast cancer Paternal Aunt        dx in his 57s  . Parkinsonism Paternal Aunt   . Lung cancer Paternal Grandfather   . Colon cancer Paternal Uncle        dx in his 34s  . Colon cancer Paternal Uncle        dx in his 23s  . Melanoma Father  69  . Psoriasis Father   . Hypertension Father   . Heart disease Father   . Breast cancer Sister        dx in her 39s  . Lupus Sister   . COPD Maternal Uncle   . Skin cancer Maternal Uncle   . Colon cancer Paternal Uncle        dx in his 87s  . Mental illness Son        bipolar  . Polycystic ovary syndrome Other      Social History  reports that she has never smoked. She has never used smokeless tobacco. She reports that she does not drink alcohol or use drugs.  Medications  Current Outpatient Medications:  .  calcium carbonate (TUMS - DOSED IN MG ELEMENTAL CALCIUM) 500 MG chewable tablet, Chew 1 tablet (200 mg of elemental calcium total) by mouth 2 (two) times daily with breakfast and lunch., Disp: , Rfl:  .  dicyclomine (BENTYL) 20 MG tablet, Take 20 mg by mouth as needed. , Disp: , Rfl:  .  FLUoxetine (PROZAC) 20 MG capsule, Take 60 mg by mouth daily., Disp: , Rfl: 12 .  FLUoxetine (PROZAC) 20 MG tablet, Take 20 mg by mouth daily., Disp: , Rfl: 1 .  furosemide (LASIX) 20 MG tablet, Take 1 tablet (20 mg total) by mouth daily as needed for edema., Disp: 90 tablet, Rfl: 3 .  hydrOXYzine (ATARAX/VISTARIL) 25 MG tablet, Take 25 mg by mouth every 6 (six) hours as needed., Disp: , Rfl: 1 .  losartan (COZAAR) 25 MG tablet, Take 25 mg by mouth daily., Disp: , Rfl: 0 .  OVER THE COUNTER MEDICATION, Take 1 capsule by mouth daily as needed. Diphenhydramate Over The Counter Anti-emetic, Disp: , Rfl:  .  pramipexole (MIRAPEX) 0.125 MG tablet, One tablet  at dinnertime and 2 at bedtime, Disp: 90 tablet, Rfl: 3 .  pravastatin (PRAVACHOL) 10 MG tablet, Take 10 mg by mouth daily., Disp: , Rfl:  .  topiramate (TOPAMAX) 25 MG tablet, Take 1 tablet (25 mg total) by mouth See admin instructions. Take one in the morning and 2 in the evening, Disp: , Rfl:  .  traMADol (ULTRAM) 50 MG tablet, Take 1 tablet (50 mg total) by mouth every 6 (six) hours as needed., Disp: 60 tablet, Rfl: 1 .  Vitamin D,  Ergocalciferol, (DRISDOL) 50000 units CAPS capsule, Take 50,000 Units by mouth every 7 (seven) days. Take 1 tablet (50,000 units) by mouth every Thursday, Disp: , Rfl:   Allergies Ramipril; Shrimp [shellfish allergy]; Minocycline hcl; Altace [ramipril]; Lyrica [pregabalin]; Other; Adhesive [tape]; Bactrim [sulfamethoxazole-trimethoprim]; Drixoral [brompheniramine-pseudoeph]; Erythromycin; Minocin [minocycline hcl]; Septra [sulfamethoxazole-trimethoprim]; and Sinutab [chlorphen-pseudoephed-apap]  Review of Systems Review of Systems - Oncology ROS negative other than SOB, inability to go back to work.     Physical Exam  Vitals Wt Readings from Last 3 Encounters:  07/31/18 219 lb (99.3 kg)  07/23/18 221 lb (100.2 kg)  06/19/18 226 lb (102.5 kg)   Temp Readings from Last 3 Encounters:  07/31/18 98.3 F (36.8 C) (Oral)  06/26/18 98.2 F (36.8 C) (Oral)  06/20/18 99 F (37.2 C) (Oral)   BP Readings from Last 3 Encounters:  07/31/18 139/84  07/23/18 110/75  06/26/18 129/80   Pulse Readings from Last 3 Encounters:  07/31/18 83  07/23/18 89  06/26/18 (!) 115    Constitutional: Well-developed, well-nourished, and in no distress.   HENT: Head: Normocephalic and atraumatic.  Mouth/Throat: No oropharyngeal exudate. Mucosa moist. Eyes: Pupils are equal, round, and reactive to light. Conjunctivae are normal. No scleral icterus.  Neck: Normal range of motion. Neck supple. No JVD present.  Cardiovascular: Normal rate, regular rhythm and normal heart sounds.  Exam reveals no gallop and no friction rub.   No murmur heard. Pulmonary/Chest: Effort normal and breath sounds normal. No respiratory distress. No wheezes.No rales.  Abdominal: Soft. Bowel sounds are normal. No distension. There is no tenderness. There is no guarding. Pt has scars noted on abdomen from recent surgery.  One on left has mild erythema.   Musculoskeletal: No edema or tenderness.  Lymphadenopathy: No cervical, axillary  or supraclavicular adenopathy.  Neurological: Alert and oriented to person, place, and time. No cranial nerve deficit.  Skin: Skin is warm and dry. No rash noted. No erythema. No pallor.  Psychiatric: Affect and judgment normal.   Labs No visits with results within 3 Day(s) from this visit.  Latest known visit with results is:  Appointment on 07/24/2018  Component Date Value Ref Range Status  . WBC 07/24/2018 6.8  4.0 - 10.5 K/uL Final  . RBC 07/24/2018 4.28  3.87 - 5.11 MIL/uL Final  . Hemoglobin 07/24/2018 13.5  12.0 - 15.0 g/dL Final  . HCT 07/24/2018 39.8  36.0 - 46.0 % Final  . MCV 07/24/2018 93.0  78.0 - 100.0 fL Final  . MCH 07/24/2018 31.5  26.0 - 34.0 pg Final  . MCHC 07/24/2018 33.9  30.0 - 36.0 g/dL Final  . RDW 07/24/2018 13.4  11.5 - 15.5 % Final  . Platelets 07/24/2018 214  150 - 400 K/uL Final  . Neutrophils Relative % 07/24/2018 62  % Final  . Neutro Abs 07/24/2018 4.2  1.7 - 7.7 K/uL Final  . Lymphocytes Relative 07/24/2018 27  % Final  . Lymphs Abs 07/24/2018 1.8  0.7 - 4.0 K/uL Final  . Monocytes Relative 07/24/2018 9  % Final  . Monocytes Absolute 07/24/2018 0.6  0.1 - 1.0 K/uL Final  . Eosinophils Relative 07/24/2018 2  % Final  . Eosinophils Absolute 07/24/2018 0.2  0.0 - 0.7 K/uL Final  . Basophils Relative 07/24/2018 0  % Final  . Basophils Absolute 07/24/2018 0.0  0.0 - 0.1 K/uL Final   Performed at [facility], [address] [place], [place] [address]  . Sodium 07/24/2018 139  135 - 145 mmol/L Final  . Potassium 07/24/2018 3.4* 3.5 - 5.1 mmol/L Final  . Chloride 07/24/2018 109  98 - 111 mmol/L Final  . CO2 07/24/2018 22  22 - 32 mmol/L Final  . Glucose, Bld 07/24/2018 129* 70 - 99 mg/dL Final  . BUN 07/24/2018 14  6 - 20 mg/dL Final  . Creatinine, Ser 07/24/2018 0.77  0.44 - 1.00 mg/dL Final  . Calcium 07/24/2018 9.0  8.9 - 10.3 mg/dL Final  . Total Protein 07/24/2018 7.7  6.5 - 8.1 g/dL Final  . Albumin 07/24/2018 4.1  3.5 - 5.0 g/dL Final  . AST  07/24/2018 18  15 - 41 U/L Final  . ALT 07/24/2018 24  0 - 44 U/L Final  . Alkaline Phosphatase 07/24/2018 61  38 - 126 U/L Final  . Total Bilirubin 07/24/2018 0.6  0.3 - 1.2 mg/dL Final  . GFR calc non Af Amer 07/24/2018 >60  >60 mL/min Final  . GFR calc Af Amer 07/24/2018 >60  >60 mL/min Final   Comment: (NOTE) The eGFR has been calculated using the CKD EPI equation. This calculation has not been validated in all clinical situations. eGFR's persistently <60 mL/min signify possible Chronic Kidney Disease.   Georgiann Hahn gap 07/24/2018 8  5 - 15 Final   Performed at [facility], [address] [place], [place] [address]  . LDH 07/24/2018 152  98 - 192 U/L Final   Performed at [facility], [address], [place], [place] [address]  . Total Protein ELP 07/24/2018 6.9  6.0 - 8.5 g/dL Final  . Albumin ELP 07/24/2018 3.7  2.9 - 4.4 g/dL Final  . Alpha-1-Globulin 07/24/2018 0.3  0.0 - 0.4 g/dL Final  . Alpha-2-Globulin 07/24/2018 0.7  0.4 - 1.0 g/dL Final  . Beta Globulin 07/24/2018 1.2  0.7 - 1.3 g/dL Final  . Gamma Globulin 07/24/2018 0.9  0.4 - 1.8 g/dL Final  . M-Spike, % 07/24/2018 Not Observed  Not Observed g/dL Final  . SPE Interp. 07/24/2018 Comment   Final   Comment: (NOTE) The SPE pattern appears essentially unremarkable. Evidence of monoclonal protein is not apparent. Performed At: [facility] [place], [place] [id number] Rush Farmer MD WY:[id number]   . Comment 07/24/2018 Comment   Final   Comment: (NOTE) Protein electrophoresis scan will follow via computer, mail, or courier delivery.   Marland Kitchen GLOBULIN, TOTAL 07/24/2018 3.2  2.2 - 3.9 g/dL Corrected  . A/G Ratio 07/24/2018 1.2  0.7 - 1.7 Corrected  . Ferritin 07/24/2018 67  11 - 307 ng/mL Final   Performed at [facility], [address], [place], [place] [address]  . IgG (Immunoglobin G), Serum 07/24/2018 966  700 - 1,600 mg/dL Final  . IgA 07/24/2018 470* 87 - 352 mg/dL Final  . IgM (Immunoglobulin  M), Srm 07/24/2018 72  26 - 217 mg/dL Final   Comment: (NOTE) Performed At: [facility] [place], [place] [id number] Rush Farmer MD NO:[id number]   .  Kappa free light chain 07/24/2018 27.0* 3.3 - 19.4 mg/L Final  . Lamda free light chains 07/24/2018 16.5  5.7 - 26.3 mg/L Final  . Kappa, lamda light chain ratio 07/24/2018 1.64  0.26 - 1.65 Final   Comment: (NOTE) Performed At: [facility] [place], [place] [id number] Rush Farmer MD [id number]   . Beta-2 Microglobulin 07/24/2018 1.3  0.6 - 2.4 mg/L Final   Comment: (NOTE) Siemens Immulite 2000 Immunochemiluminometric assay (ICMA) Values obtained with different assay methods or kits cannot be used interchangeably. Results cannot be interpreted as absolute evidence of the presence or absence of malignant disease. Performed At: [facility] [place], [place] [id number] Rush Farmer MD JJ:[id number]      Pathology Orders Placed This Encounter  Procedures  . CT ANGIO CHEST PE W OR WO CONTRAST    Standing Status:   Future    Number of Occurrences:   1    Standing Expiration Date:   11/01/2019    Order Specific Question:   ** REASON FOR EXAM (FREE TEXT)    Answer:   pt s/p recent surgery    Order Specific Question:   If indicated for the ordered procedure, I authorize the administration of contrast media per Radiology protocol    Answer:   Yes    Order Specific Question:   Is patient pregnant?    Answer:   No    Order Specific Question:   Preferred imaging location?    Answer:   [facility]    Order Specific Question:   Call Results- Best Contact Number?    Answer:   [phone]- leave when done    Order Specific Question:   Radiology Contrast Protocol - do NOT remove file path    Answer:   \\[email or web address]  . NM Bone Scan Whole Body    Standing Status:   Future    Standing Expiration Date:   07/31/2019    Order  Specific Question:   ** REASON FOR EXAM (FREE TEXT)    Answer:   diffuse joint pain    Order Specific Question:   If indicated for the ordered procedure, I authorize the administration of a radiopharmaceutical per Radiology protocol    Answer:   Yes    Order Specific Question:   Is the patient pregnant?    Answer:   No    Order Specific Question:   Preferred imaging location?    Answer:   [facility]    Order Specific Question:   Radiology Contrast Protocol - do NOT remove file path    Answer:   \\[email or web address]  . CBC with Differential/Platelet    Standing Status:   Future    Standing Expiration Date:   07/31/2020  . Comprehensive metabolic panel    Standing Status:   Future    Standing Expiration Date:   07/31/2020  . Lactate dehydrogenase    Standing Status:   Future    Standing Expiration Date:   07/31/2020       Zoila Shutter MD

## 2018-08-01 ENCOUNTER — Emergency Department (HOSPITAL_COMMUNITY): Payer: BLUE CROSS/BLUE SHIELD

## 2018-08-01 ENCOUNTER — Telehealth: Payer: Self-pay | Admitting: Cardiology

## 2018-08-01 ENCOUNTER — Encounter (HOSPITAL_COMMUNITY): Payer: Self-pay | Admitting: Emergency Medicine

## 2018-08-01 ENCOUNTER — Telehealth (HOSPITAL_COMMUNITY): Payer: Self-pay | Admitting: *Deleted

## 2018-08-01 ENCOUNTER — Other Ambulatory Visit: Payer: Self-pay

## 2018-08-01 ENCOUNTER — Observation Stay (HOSPITAL_COMMUNITY)
Admission: EM | Admit: 2018-08-01 | Discharge: 2018-08-02 | Disposition: A | Discharge status: Home / Self Care | Payer: BLUE CROSS/BLUE SHIELD | Attending: Internal Medicine | Admitting: Internal Medicine

## 2018-08-01 DIAGNOSIS — M797 Fibromyalgia: Secondary | ICD-10-CM | POA: Diagnosis not present

## 2018-08-01 DIAGNOSIS — R079 Chest pain, unspecified: Secondary | ICD-10-CM | POA: Diagnosis present

## 2018-08-01 DIAGNOSIS — Z79899 Other long term (current) drug therapy: Secondary | ICD-10-CM | POA: Insufficient documentation

## 2018-08-01 DIAGNOSIS — R0602 Shortness of breath: Secondary | ICD-10-CM | POA: Diagnosis not present

## 2018-08-01 DIAGNOSIS — K219 Gastro-esophageal reflux disease without esophagitis: Secondary | ICD-10-CM | POA: Diagnosis not present

## 2018-08-01 DIAGNOSIS — I10 Essential (primary) hypertension: Secondary | ICD-10-CM | POA: Diagnosis not present

## 2018-08-01 DIAGNOSIS — R0789 Other chest pain: Principal | ICD-10-CM | POA: Insufficient documentation

## 2018-08-01 DIAGNOSIS — F419 Anxiety disorder, unspecified: Secondary | ICD-10-CM

## 2018-08-01 DIAGNOSIS — F32A Depression, unspecified: Secondary | ICD-10-CM | POA: Diagnosis present

## 2018-08-01 DIAGNOSIS — F418 Other specified anxiety disorders: Secondary | ICD-10-CM | POA: Diagnosis not present

## 2018-08-01 DIAGNOSIS — C642 Malignant neoplasm of left kidney, except renal pelvis: Secondary | ICD-10-CM | POA: Insufficient documentation

## 2018-08-01 DIAGNOSIS — E669 Obesity, unspecified: Secondary | ICD-10-CM | POA: Diagnosis not present

## 2018-08-01 DIAGNOSIS — R072 Precordial pain: Secondary | ICD-10-CM | POA: Diagnosis not present

## 2018-08-01 DIAGNOSIS — F329 Major depressive disorder, single episode, unspecified: Secondary | ICD-10-CM | POA: Diagnosis present

## 2018-08-01 LAB — BASIC METABOLIC PANEL
Anion gap: 10 (ref 5–15)
BUN: 12 mg/dL (ref 6–20)
CO2: 21 mmol/L — AB (ref 22–32)
Calcium: 9.3 mg/dL (ref 8.9–10.3)
Chloride: 109 mmol/L (ref 98–111)
Creatinine, Ser: 0.79 mg/dL (ref 0.44–1.00)
GFR calc non Af Amer: >60 mL/min (ref >60)
Glucose, Bld: 95 mg/dL (ref 70–99)
POTASSIUM: 3.4 mmol/L — AB (ref 3.5–5.1)
SODIUM: 140 mmol/L (ref 135–145)

## 2018-08-01 LAB — TROPONIN I: Troponin I: <0.03 ng/mL (ref <0.03)

## 2018-08-01 LAB — CBC
HCT: 40.6 % (ref 36.0–46.0)
HEMOGLOBIN: 13.5 g/dL (ref 12.0–15.0)
MCH: 30.8 pg (ref 26.0–34.0)
MCHC: 33.3 g/dL (ref 30.0–36.0)
MCV: 92.5 fL (ref 80.0–100.0)
NRBC: 0 % (ref 0.0–0.2)
Platelets: 285 10*3/uL (ref 150–400)
RBC: 4.39 MIL/uL (ref 3.87–5.11)
RDW: 12.7 % (ref 11.5–15.5)
WBC: 6.4 10*3/uL (ref 4.0–10.5)

## 2018-08-01 LAB — HEPATIC FUNCTION PANEL
ALBUMIN: 4.4 g/dL (ref 3.5–5.0)
ALT: 29 U/L (ref 0–44)
AST: 24 U/L (ref 15–41)
Alkaline Phosphatase: 65 U/L (ref 38–126)
Bilirubin, Direct: 0.1 mg/dL (ref 0.0–0.2)
TOTAL PROTEIN: 8.1 g/dL (ref 6.5–8.1)
Total Bilirubin: 0.6 mg/dL (ref 0.3–1.2)

## 2018-08-01 LAB — DIFFERENTIAL
BASOS ABS: 0 10*3/uL (ref 0.0–0.1)
Basophils Relative: 0 %
Eosinophils Absolute: 0.1 10*3/uL (ref 0.0–0.5)
Eosinophils Relative: 2 %
Lymphocytes Relative: 32 %
Lymphs Abs: 2 10*3/uL (ref 0.7–4.0)
Monocytes Absolute: 0.6 10*3/uL (ref 0.1–1.0)
Monocytes Relative: 10 %
NEUTROS ABS: 3.5 10*3/uL (ref 1.7–7.7)
NEUTROS PCT: 56 %
NRBC: 0 /100{WBCs}

## 2018-08-01 LAB — BRAIN NATRIURETIC PEPTIDE: B NATRIURETIC PEPTIDE 5: 21 pg/mL (ref 0.0–100.0)

## 2018-08-01 LAB — I-STAT BETA HCG BLOOD, ED (NOT ORDERABLE): I-stat hCG, quantitative: <5 m[IU]/mL (ref <5)

## 2018-08-01 MED ORDER — LOSARTAN POTASSIUM 50 MG PO TABS
25.0000 mg | ORAL_TABLET | Freq: Every day | ORAL | Status: DC
  Administered 2018-08-02: 25 mg via ORAL
  Filled 2018-08-01: qty 1

## 2018-08-01 MED ORDER — TOPIRAMATE 25 MG PO TABS: 25.0000 mg | ORAL_TABLET | ORAL | Status: DC

## 2018-08-01 MED ORDER — HYDROXYZINE HCL 25 MG PO TABS: 25.0000 mg | ORAL_TABLET | Freq: Four times a day (QID) | ORAL | Status: DC | PRN

## 2018-08-01 MED ORDER — PRAVASTATIN SODIUM 10 MG PO TABS
10.0000 mg | ORAL_TABLET | Freq: Every day | ORAL | Status: DC
  Administered 2018-08-02: 10 mg via ORAL
  Filled 2018-08-01: qty 1

## 2018-08-01 MED ORDER — ENOXAPARIN SODIUM 40 MG/0.4ML ~~LOC~~ SOLN
40.0000 mg | SUBCUTANEOUS | Status: DC
  Administered 2018-08-02: 40 mg via SUBCUTANEOUS
  Filled 2018-08-01: qty 0.4

## 2018-08-01 MED ORDER — ONDANSETRON HCL 4 MG/2ML IJ SOLN: 4.0000 mg | Freq: Four times a day (QID) | INTRAMUSCULAR | Status: DC | PRN

## 2018-08-01 MED ORDER — PRAMIPEXOLE DIHYDROCHLORIDE 0.125 MG PO TABS: 0.1250 mg | ORAL_TABLET | Freq: Two times a day (BID) | ORAL | Status: DC

## 2018-08-01 MED ORDER — FLUOXETINE HCL 20 MG PO CAPS
60.0000 mg | ORAL_CAPSULE | Freq: Every day | ORAL | Status: DC
  Administered 2018-08-02: 60 mg via ORAL
  Filled 2018-08-01: qty 3

## 2018-08-01 MED ORDER — TRAMADOL HCL 50 MG PO TABS: 50.0000 mg | ORAL_TABLET | Freq: Four times a day (QID) | ORAL | Status: DC | PRN

## 2018-08-01 MED ORDER — ACETAMINOPHEN 325 MG PO TABS: 650.0000 mg | ORAL_TABLET | ORAL | Status: DC | PRN

## 2018-08-01 NOTE — Telephone Encounter (Signed)
Spoke with the pt about her CT/angio that was done yesterday. Pt is aware that she does not have a PE per Dr. Walden Field and that she will need to follow up with her Cardiologist to rule out Pulmonary Hypertension. Pt verbalized understanding and stated that she will call her cardiologist and set up an appointment.

## 2018-08-01 NOTE — ED Provider Notes (Signed)
[facility] EMERGENCY DEPARTMENT Provider Note   CSN: [id number] Arrival date & time: 08/01/18  1628     History   Chief Complaint Chief Complaint  Patient presents with  . Chest Pain    HPI Sarah Cooley is a 52 y.o. female.  Patient states she is been having chest pain off and on with exertion for last 2 weeks.  It seems to be getting worse.  She also has some shortness of breath and sweating  The history is provided by the patient. No language interpreter was used.  Chest Pain   This is a new problem. The current episode started more than 1 week ago. The problem occurs constantly. The problem has not changed since onset.The pain is associated with exertion. The pain is present in the substernal region. The pain is at a severity of 7/10. The pain is moderate. The quality of the pain is described as dull. The pain does not radiate. Pertinent negatives include no abdominal pain, no back pain, no cough and no headaches.  Pertinent negatives for past medical history include no seizures.    Past Medical History:  Diagnosis Date  . Allergy    seasonal  . Anxiety   . Arthritis   . Balance problem   . Brain aneurysm    2 mm ACA region aneurysm by 09/21/15 MRA  . Breast cancer ([place])   . Breast cancer of lower-inner quadrant of right female breast ([place]) 08/26/2015  . Chronic kidney disease    acute renal failure one occasion  . Complication of anesthesia    heart rate drops and O2 sats drop  . Constipation   . Depression   . Diplopia 10/29/2017  . Dizziness 10/29/2017  . Elevated liver function tests   . Family history of adverse reaction to anesthesia    mom has n/v  . Family history of breast cancer   . Family history of colon cancer   . Fibromyalgia   . Fibromyalgia   . Gait abnormality 10/29/2017  . Genetic testing 09/08/2015   Negative genetic testing on the Breast/High Moderate Risk panel. The Breast High/Moderate Risk gene panel offered by [facility] includes sequencing  and deletion/duplication analysis of the following 9 genes: ATM, BRCA1, BRCA2, CDH1, CHEK2, PALB2, PTEN, STK11, and TP53. The report date is September 08, 2015.  The Rest of the Comprehensive Cancer panel has been reflexed and will be available in 2-3 weeks.  POLD1 c.208G>T VUS found on the Remainder of the Comprehensive cancer panel.  The Comprehensive Cancer Panel offered by [facility] includes sequencing and/or deletion duplication testing of the following 32 genes: APC, ATM, AXIN2, BARD1, BMPR1A, BRCA1, BRCA2, BRIP1, CDH1, CDK4, CDKN2A, CHEK2, EPCAM, FANCC, MLH1, MSH2, MSH6, MUTYH, NBN, PALB2, PMS2, POLD1, POLE, PTEN, RAD51C, RAD51D, SCG5/GREM1, SMAD4, STK11, TP53, VHL, and XRCC2.   The report date is 09/12/2015.   Marland Kitchen GERD (gastroesophageal reflux disease)    none recent  . H/O acute renal failure 09/22/15   admitted to [facility]  . Headache    occipital and temporal  . History of hiatal hernia   . Hyperlipemia   . Hyperlipidemia   . Hypertension   . Irritable bowel syndrome (IBS)   . MGUS (monoclonal gammopathy of unknown significance) 03/15/2016  . Neuromuscular disorder (HCC)    fibromyalgia  . Occasional tremors   . OSA (obstructive sleep apnea)    uses cpap with humidification- auto   . Personal history of radiation therapy   . PONV (postoperative  nausea and vomiting)   . Renal cell carcinoma ([place])   . Tremor, essential 10/29/2017    Patient Active Problem List   Diagnosis Date Noted  . Family history of melanoma 07/31/2018  . Renal cancer, left ([place]) 07/31/2018  . Renal mass, left 06/19/2018  . Clostridium difficile diarrhea   . Prolonged Q-T interval on ECG   . Weakness   . Hypokalemia, gastrointestinal losses 05/25/2018  . Hypokalemia 05/24/2018  . Dizziness 10/29/2017  . Gait abnormality 10/29/2017  . Tremor, essential 10/29/2017  . Diplopia 10/29/2017  . Obstructive sleep apnea on CPAP 12/10/2016  . Plantar fasciitis, bilateral 12/10/2016  . Fibromyalgia 11/09/2016   . IBS (irritable bowel syndrome) 11/09/2016  . GERD (gastroesophageal reflux disease) 11/09/2016  . Anxiety and depression 11/09/2016  . MGUS (monoclonal gammopathy of unknown significance) 03/15/2016  . Family history of breast cancer   . Family history of colon cancer   . Breast cancer of lower-inner quadrant of right female breast ([place]) 08/26/2015  . Headache 07/17/2015  . Hypertension 07/17/2015  . Obesity (BMI 35.0-39.9 without comorbidity) 07/17/2015    Past Surgical History:  Procedure Laterality Date  . ABDOMINAL HYSTERECTOMY     adenomyosis  . ABDOMINAL HYSTERECTOMY    . BLADDER SURGERY    . BONE GRAFT HIP ILIAC CREST    . BREAST BIOPSY Bilateral   . BREAST LUMPECTOMY    . BREAST SURGERY    . CHOLECYSTECTOMY    . FRACTURE SURGERY Right    wrist  . KNEE ARTHROSCOPY    . KNEE ARTHROSCOPY W/ ACL RECONSTRUCTION     right  . RADIOACTIVE SEED GUIDED PARTIAL MASTECTOMY WITH AXILLARY SENTINEL LYMPH NODE BIOPSY Right 10/13/2015   Procedure: RADIOACTIVE SEED GUIDED PARTIAL MASTECTOMY WITH AXILLARY SENTINEL LYMPH NODE BIOPSY;  Surgeon: Erroll Luna, MD;  Location: [place];  Service: General;  Laterality: Right;  . right carpal and cubital tunnel release     . ROBOTIC ASSITED PARTIAL NEPHRECTOMY Left 06/19/2018   Procedure: XI ROBOTIC ASSITED PARTIAL NEPHRECTOMY;  Surgeon: Ardis Hughs, MD;  Location: [facility];  Service: Urology;  Laterality: Left;  . WRIST SURGERY       OB History   None      Home Medications    Prior to Admission medications   Medication Sig Start Date End Date Taking? Authorizing Provider  FLUoxetine (PROZAC) 20 MG capsule Take 60 mg by mouth daily. 06/19/18  Yes [provider]  losartan (COZAAR) 25 MG tablet Take 25 mg by mouth daily. 06/10/18  Yes [provider]  pramipexole (MIRAPEX) 0.125 MG tablet One tablet at dinnertime and 2 at bedtime 07/23/18  Yes Kathrynn Ducking, MD  pravastatin (PRAVACHOL) 10 MG tablet Take 10 mg  by mouth daily.   Yes [provider]  topiramate (TOPAMAX) 25 MG tablet Take 1 tablet (25 mg total) by mouth See admin instructions. Take one in the morning and 2 in the evening 05/29/18  Yes Johnson, Clanford L, MD  traMADol (ULTRAM) 50 MG tablet Take 1 tablet (50 mg total) by mouth every 6 (six) hours as needed. 07/23/18  Yes Kathrynn Ducking, MD  Vitamin D, Ergocalciferol, (DRISDOL) 50000 units CAPS capsule Take 50,000 Units by mouth every 7 (seven) days. Take 1 tablet (50,000 units) by mouth every Thursday   Yes [provider]  calcium carbonate (TUMS - DOSED IN MG ELEMENTAL CALCIUM) 500 MG chewable tablet Chew 1 tablet (200 mg of elemental calcium total) by mouth 2 (  two) times daily with breakfast and lunch. 05/29/18   Johnson, Clanford L, MD  dicyclomine (BENTYL) 20 MG tablet Take 20 mg by mouth as needed.     [provider]  furosemide (LASIX) 20 MG tablet Take 1 tablet (20 mg total) by mouth daily as needed for edema. 06/11/18 09/09/18  Arnoldo Lenis, MD  hydrOXYzine (ATARAX/VISTARIL) 25 MG tablet Take 25 mg by mouth every 6 (six) hours as needed. 07/03/18   [provider]  OVER THE COUNTER MEDICATION Take 1 capsule by mouth daily as needed. Diphenhydramate Over The Counter Anti-emetic    [provider]    Family History Family History  Problem Relation Age of Onset  . Hypertension Mother   . Autoimmune disease Mother        lichen planus  . Eczema Mother   . Parkinson's disease Mother   . Mental illness Mother        bipolar  . Heart disease Maternal Aunt   . Lung cancer Paternal Aunt   . Breast cancer Paternal Aunt        dx in his 17s  . Parkinsonism Paternal Aunt   . Lung cancer Paternal Grandfather   . Colon cancer Paternal Uncle        dx in his 19s  . Colon cancer Paternal Uncle        dx in his 41s  . Melanoma Father 33  . Psoriasis Father   . Hypertension Father   . Heart disease Father   . Breast cancer Sister         dx in her 65s  . Lupus Sister   . COPD Maternal Uncle   . Skin cancer Maternal Uncle   . Colon cancer Paternal Uncle        dx in his 64s  . Mental illness Son        bipolar  . Polycystic ovary syndrome Other     Social History Social History   Tobacco Use  . Smoking status: Never Smoker  . Smokeless tobacco: Never Used  Substance Use Topics  . Alcohol use: Never    Frequency: Never    Comment: maybe one a year  . Drug use: Never     Allergies   Ramipril; Shrimp [shellfish allergy]; Minocycline hcl; Altace [ramipril]; Lyrica [pregabalin]; Other; Adhesive [tape]; Bactrim [sulfamethoxazole-trimethoprim]; Drixoral [brompheniramine-pseudoeph]; Erythromycin; Minocin [minocycline hcl]; Septra [sulfamethoxazole-trimethoprim]; and Sinutab [chlorphen-pseudoephed-apap]   Review of Systems Review of Systems  Constitutional: Negative for appetite change and fatigue.  HENT: Negative for congestion, ear discharge and sinus pressure.   Eyes: Negative for discharge.  Respiratory: Negative for cough.   Cardiovascular: Positive for chest pain.  Gastrointestinal: Negative for abdominal pain and diarrhea.  Genitourinary: Negative for frequency and hematuria.  Musculoskeletal: Negative for back pain.  Skin: Negative for rash.  Neurological: Negative for seizures and headaches.  Psychiatric/Behavioral: Negative for hallucinations.     Physical Exam Updated Vital Signs BP (!) 159/108   Pulse 75   Temp 97.8 F (36.6 C) (Temporal)   Resp (!) 21   Ht 5' 8"  (1.727 m)   Wt 99.3 kg   SpO2 98%   BMI 33.29 kg/m   Physical Exam  Constitutional: She is oriented to person, place, and time. She appears well-developed.  HENT:  Head: Normocephalic.  Eyes: Conjunctivae and EOM are normal. No scleral icterus.  Neck: Neck supple. No thyromegaly present.  Cardiovascular: Normal rate and regular rhythm. Exam reveals no gallop and  no friction rub.  No murmur heard. Pulmonary/Chest: No  stridor. She has no wheezes. She has no rales. She exhibits no tenderness.  Abdominal: She exhibits no distension. There is no tenderness. There is no rebound.  Musculoskeletal: Normal range of motion. She exhibits no edema.  Lymphadenopathy:    She has no cervical adenopathy.  Neurological: She is oriented to person, place, and time. She exhibits normal muscle tone. Coordination normal.  Skin: No rash noted. No erythema.  Psychiatric: She has a normal mood and affect. Her behavior is normal.     ED Treatments / Results  Labs (all labs ordered are listed, but only abnormal results are displayed) Labs Reviewed  BASIC METABOLIC PANEL - Abnormal; Notable for the following components:      Result Value   Potassium 3.4 (*)    CO2 21 (*)    All other components within normal limits  CBC  TROPONIN I  BRAIN NATRIURETIC PEPTIDE  DIFFERENTIAL  HEPATIC FUNCTION PANEL  I-STAT BETA HCG BLOOD, ED (NOT ORDERABLE)    EKG EKG Interpretation  Date/Time:  Friday August 01 2018 16:35:56 EDT Ventricular Rate:  85 PR Interval:  150 QRS Duration: 88 QT Interval:  394 QTC Calculation: 468 R Axis:   16 Text Interpretation:  Normal sinus rhythm Nonspecific ST and T wave abnormality Abnormal ECG Confirmed by Milton Ferguson [phone]) on 08/01/2018 5:04:32 PM Also confirmed by Milton Ferguson [phone])  on 08/01/2018 7:48:10 PM   Radiology Dg Chest 2 View  Result Date: 08/01/2018 CLINICAL DATA:  Worsening dyspnea and chest pain x2 weeks. EXAM: CHEST - 2 VIEW COMPARISON:  CT 07/31/2018, CXR 01/18/2018 FINDINGS: The heart size and mediastinal contours are within normal limits. Mild uncoiling of the thoracic aorta without aneurysm. Both lungs are clear. The visualized skeletal structures are unremarkable. IMPRESSION: No active cardiopulmonary disease. Electronically Signed   By: Ashley Royalty M.D.   On: 08/01/2018 18:06   Ct Angio Chest Pe W Or Wo Contrast  Result Date: 07/31/2018 CLINICAL DATA:   Shortness of breath for 2 1/2 weeks with increased heart rate. History of partial LEFT nephrectomy. History of RIGHT breast cancer. EXAM: CT ANGIOGRAPHY CHEST WITH CONTRAST TECHNIQUE: Multidetector CT imaging of the chest was performed using the standard protocol during bolus administration of intravenous contrast. Multiplanar CT image reconstructions and MIPs were obtained to evaluate the vascular anatomy. CONTRAST:  170m ISOVUE-370 IOPAMIDOL (ISOVUE-370) INJECTION 76% COMPARISON:  01/18/2018 FINDINGS: Cardiovascular: The heart size is normal. Pulmonary arteries are well opacified by contrast bolus. There is no acute pulmonary embolus. The main pulmonary artery is 3.3 centimeters. Findings are consistent with pulmonary arterial hypertension. Mediastinum/Nodes: The visualized portion of the thyroid gland has a normal appearance. No mediastinal, hilar, or axillary adenopathy. Esophagus is normal. Lungs/Pleura: Lungs are clear. No pleural effusion or pneumothorax. Upper Abdomen: Unremarkable. Musculoskeletal: There are mild degenerative changes in the midthoracic spine. No suspicious lytic or blastic lesions are identified. Postoperative changes are identified in the RIGHT breast Review of the MIP images confirms the above findings. IMPRESSION: 1. Technically adequate exam showing no acute pulmonary embolus. 2. Enlarged pulmonary arteries consistent with pulmonary arterial hypertension. 3. Postoperative changes in the RIGHT breast consistent with RIGHT lumpectomy. Electronically Signed   By: ENolon NationsM.D.   On: 07/31/2018 16:50    Procedures Procedures (including critical care time)  Medications Ordered in ED Medications - No data to display   Initial Impression / Assessment and Plan / ED Course  I have reviewed  the triage vital signs and the nursing notes.  Pertinent labs & imaging results that were available during my care of the patient were reviewed by me and considered in my medical decision  making (see chart for details).     Labs unremarkable.  Patient will be admitted to the hospital for chest pain and further evaluation  Final Clinical Impressions(s) / ED Diagnoses   Final diagnoses:  Atypical chest pain    ED Discharge Orders    None       Milton Ferguson, MD 08/01/18 2029

## 2018-08-01 NOTE — Telephone Encounter (Signed)
Patient states that her [place] wants her seen due to possible Pulmonary HTN. Please call patient to discuss symptoms. / tg

## 2018-08-01 NOTE — ED Triage Notes (Signed)
Patient complaining of shortness of breath and chest pain x 2 weeks worsening each day. States she is having chest pressure radiating into bilateral jaws. Also complaining of lower back pain radiating down bilateral legs.

## 2018-08-01 NOTE — Telephone Encounter (Signed)
Patient has had SOB and CP for past 2 1/2 weeks to the point she has no energy at all.Her oncologist wonders if she has pulmonary hypertension and wants her to be seen.Our next available apt is 11/18. At this point, she is going to go to the ED for evaluation

## 2018-08-01 NOTE — H&P (Signed)
History and Physical  Sarah Cooley FBP:[id number] DOB: 10-31-1965 DOA: 08/01/2018  Referring physician: Dr Dewayne Hatch, ED physician PCP: Celene Squibb, MD   Patient Coming From: home  Chief Complaint: Exertional chest pain.  HPI: Sarah Cooley is a 52 y.o. female with a history of 2 mm ACA brain aneurysm, history of breast cancer, chronic kidney disease, anxiety, fibromyalgia, renal cell carcinoma status post partial nephrectomy on 06/19/2018.  Patient presents with 2 weeks of worsening exertional chest pain across her entire chest with radiation into her left jaw.  Her symptoms last for a few moments when she exerts herself and improves with rest.  No other palliating or provoking factors.  Her symptoms are worsening and have been becoming more frequent.  She saw her primary care physician who thought that this was due to her recent surgery and recovery.  Her oncologist ordered a CTA yesterday to rule out PE.  The CTA was normal.  Emergency Department Course: Chest x-ray normal.  First troponin negative.  BNP normal.  All of the labs fairly normal.  Review of Systems:   Pt denies any fevers, chills, nausea, vomiting, diarrhea, constipation, abdominal pain, shortness of breath, orthopnea, cough, wheezing, palpitations, headache, vision changes, lightheadedness, dizziness, melena, rectal bleeding.  Review of systems are otherwise negative  Past Medical History:  Diagnosis Date  . Allergy    seasonal  . Anxiety   . Arthritis   . Balance problem   . Brain aneurysm    2 mm ACA region aneurysm by 09/21/15 MRA  . Breast cancer ([place])   . Breast cancer of lower-inner quadrant of right female breast ([place]) 08/26/2015  . Chronic kidney disease    acute renal failure one occasion  . Complication of anesthesia    heart rate drops and O2 sats drop  . Constipation   . Depression   . Diplopia 10/29/2017  . Dizziness 10/29/2017  . Elevated liver function tests   . Family history of adverse  reaction to anesthesia    mom has n/v  . Family history of breast cancer   . Family history of colon cancer   . Fibromyalgia   . Fibromyalgia   . Gait abnormality 10/29/2017  . Genetic testing 09/08/2015   Negative genetic testing on the Breast/High Moderate Risk panel. The Breast High/Moderate Risk gene panel offered by [facility] includes sequencing and deletion/duplication analysis of the following 9 genes: ATM, BRCA1, BRCA2, CDH1, CHEK2, PALB2, PTEN, STK11, and TP53. The report date is September 08, 2015.  The Rest of the Comprehensive Cancer panel has been reflexed and will be available in 2-3 weeks.  POLD1 c.208G>T VUS found on the Remainder of the Comprehensive cancer panel.  The Comprehensive Cancer Panel offered by [facility] includes sequencing and/or deletion duplication testing of the following 32 genes: APC, ATM, AXIN2, BARD1, BMPR1A, BRCA1, BRCA2, BRIP1, CDH1, CDK4, CDKN2A, CHEK2, EPCAM, FANCC, MLH1, MSH2, MSH6, MUTYH, NBN, PALB2, PMS2, POLD1, POLE, PTEN, RAD51C, RAD51D, SCG5/GREM1, SMAD4, STK11, TP53, VHL, and XRCC2.   The report date is 09/12/2015.   Marland Kitchen GERD (gastroesophageal reflux disease)    none recent  . H/O acute renal failure 09/22/15   admitted to [facility]  . Headache    occipital and temporal  . History of hiatal hernia   . Hyperlipemia   . Hyperlipidemia   . Hypertension   . Irritable bowel syndrome (IBS)   . MGUS (monoclonal gammopathy of unknown significance) 03/15/2016  . Neuromuscular disorder (HCC)    fibromyalgia  .  Occasional tremors   . OSA (obstructive sleep apnea)    uses cpap with humidification- auto   . Personal history of radiation therapy   . PONV (postoperative nausea and vomiting)   . Renal cell carcinoma ([place])   . Tremor, essential 10/29/2017   Past Surgical History:  Procedure Laterality Date  . ABDOMINAL HYSTERECTOMY     adenomyosis  . ABDOMINAL HYSTERECTOMY    . BLADDER SURGERY    . BONE GRAFT HIP ILIAC CREST    . BREAST BIOPSY Bilateral     . BREAST LUMPECTOMY    . BREAST SURGERY    . CHOLECYSTECTOMY    . FRACTURE SURGERY Right    wrist  . KNEE ARTHROSCOPY    . KNEE ARTHROSCOPY W/ ACL RECONSTRUCTION     right  . RADIOACTIVE SEED GUIDED PARTIAL MASTECTOMY WITH AXILLARY SENTINEL LYMPH NODE BIOPSY Right 10/13/2015   Procedure: RADIOACTIVE SEED GUIDED PARTIAL MASTECTOMY WITH AXILLARY SENTINEL LYMPH NODE BIOPSY;  Surgeon: Erroll Luna, MD;  Location: [facility] [facility];  Service: General;  Laterality: Right;  . right carpal and cubital tunnel release     . ROBOTIC ASSITED PARTIAL NEPHRECTOMY Left 06/19/2018   Procedure: XI ROBOTIC ASSITED PARTIAL NEPHRECTOMY;  Surgeon: Ardis Hughs, MD;  Location: [facility];  Service: Urology;  Laterality: Left;  . WRIST SURGERY     Social History:  reports that she has never smoked. She has never used smokeless tobacco. She reports that she does not drink alcohol or use drugs. Patient lives at home  Allergies  Allergen Reactions  . Ramipril Nausea And Vomiting and Other (See Comments)    Ramipril--Caused Pancreatitis  . Shrimp [Shellfish Allergy] Anaphylaxis and Swelling  . Minocycline Hcl Hives  . Altace [Ramipril] Nausea And Vomiting and Nausea Only    Drug induced pancreatitis   . Lyrica [Pregabalin] Nausea And Vomiting  . Other     ADHESIVE GLUE USED FOR INCISIONS  . Adhesive [Tape] Rash    Please use paper tape Patient is allergic to many adhesives   . Bactrim [Sulfamethoxazole-Trimethoprim] Other (See Comments)    May have caused kidney issues  . Drixoral [Brompheniramine-Pseudoeph] Rash  . Erythromycin Other (See Comments)    GI- Upset / severe abdominal pain  . Minocin [Minocycline Hcl] Nausea And Vomiting and Rash  . Septra [Sulfamethoxazole-Trimethoprim] Rash  . Sinutab [Chlorphen-Pseudoephed-Apap] Rash    Family History  Problem Relation Age of Onset  . Hypertension Mother   . Autoimmune disease Mother        lichen planus  . Eczema Mother   . Parkinson's disease Mother    . Mental illness Mother        bipolar  . Heart disease Maternal Aunt   . Lung cancer Paternal Aunt   . Breast cancer Paternal Aunt        dx in his 46s  . Parkinsonism Paternal Aunt   . Lung cancer Paternal Grandfather   . Colon cancer Paternal Uncle        dx in his 19s  . Colon cancer Paternal Uncle        dx in his 23s  . Melanoma Father 74  . Psoriasis Father   . Hypertension Father   . Heart disease Father   . Breast cancer Sister        dx in her 43s  . Lupus Sister   . COPD Maternal Uncle   . Skin cancer Maternal Uncle   . Colon cancer Paternal Uncle  dx in his 30s  . Mental illness Son        bipolar  . Polycystic ovary syndrome Other       Prior to Admission medications   Medication Sig Start Date End Date Taking? Authorizing Provider  FLUoxetine (PROZAC) 20 MG capsule Take 60 mg by mouth daily. 06/19/18  Yes [provider]  losartan (COZAAR) 25 MG tablet Take 25 mg by mouth daily. 06/10/18  Yes [provider]  pramipexole (MIRAPEX) 0.125 MG tablet One tablet at dinnertime and 2 at bedtime 07/23/18  Yes Kathrynn Ducking, MD  pravastatin (PRAVACHOL) 10 MG tablet Take 10 mg by mouth daily.   Yes [provider]  topiramate (TOPAMAX) 25 MG tablet Take 1 tablet (25 mg total) by mouth See admin instructions. Take one in the morning and 2 in the evening 05/29/18  Yes Johnson, Clanford L, MD  traMADol (ULTRAM) 50 MG tablet Take 1 tablet (50 mg total) by mouth every 6 (six) hours as needed. 07/23/18  Yes Kathrynn Ducking, MD  Vitamin D, Ergocalciferol, (DRISDOL) 50000 units CAPS capsule Take 50,000 Units by mouth every 7 (seven) days. Take 1 tablet (50,000 units) by mouth every Thursday   Yes [provider]  calcium carbonate (TUMS - DOSED IN MG ELEMENTAL CALCIUM) 500 MG chewable tablet Chew 1 tablet (200 mg of elemental calcium total) by mouth 2 (two) times daily with breakfast and lunch. 05/29/18   Johnson, Clanford L, MD  dicyclomine  (BENTYL) 20 MG tablet Take 20 mg by mouth as needed.     [provider]  furosemide (LASIX) 20 MG tablet Take 1 tablet (20 mg total) by mouth daily as needed for edema. 06/11/18 09/09/18  Arnoldo Lenis, MD  hydrOXYzine (ATARAX/VISTARIL) 25 MG tablet Take 25 mg by mouth every 6 (six) hours as needed. 07/03/18   [provider]  OVER THE COUNTER MEDICATION Take 1 capsule by mouth daily as needed. Diphenhydramate Over The Counter Anti-emetic    [provider]    Physical Exam: BP (!) 159/108   Pulse 75   Temp 97.8 F (36.6 C) (Temporal)   Resp (!) 21   Ht 5' 8"  (1.727 m)   Wt 99.3 kg   SpO2 98%   BMI 33.29 kg/m   . General: Middle-aged Caucasian female. Awake and alert and oriented x3. No acute cardiopulmonary distress.  Marland Kitchen HEENT: Normocephalic atraumatic.  Right and left ears normal in appearance.  Pupils equal, round, reactive to light. Extraocular muscles are intact. Sclerae anicteric and noninjected.  Moist mucosal membranes. No mucosal lesions.  . Neck: Neck supple without lymphadenopathy. No carotid bruits. No masses palpated.  . Cardiovascular: Regular rate with normal S1-S2 sounds. No murmurs, rubs, gallops auscultated. No JVD.  Marland Kitchen Respiratory: Good respiratory effort with no wheezes, rales, rhonchi. Lungs clear to auscultation bilaterally.  No accessory muscle use. . Abdomen: Soft, nontender, nondistended. Active bowel sounds. No masses or hepatosplenomegaly.  Healing skin incisions from surgery . Skin: No rashes, lesions, or ulcerations.  Dry, warm to touch. 2+ dorsalis pedis and radial pulses. . Musculoskeletal: No calf or leg pain. All major joints not erythematous nontender.  No upper or lower joint deformation.  Good ROM.  No contractures  . Psychiatric: Intact judgment and insight. Pleasant and cooperative. . Neurologic: No focal neurological deficits. Strength is 5/5 and symmetric in upper and lower extremities.  Cranial nerves II through XII are  grossly intact.  Labs on Admission: I have personally reviewed following labs and imaging studies  CBC: Recent Labs  Lab 08/01/18 1821  WBC 6.4  NEUTROABS 3.5  HGB 13.5  HCT 40.6  MCV 92.5  PLT 272   Basic Metabolic Panel: Recent Labs  Lab 08/01/18 1821  NA 140  K 3.4*  CL 109  CO2 21*  GLUCOSE 95  BUN 12  CREATININE 0.79  CALCIUM 9.3   GFR: Estimated Creatinine Clearance: 101.4 mL/min (by C-G formula based on SCr of 0.79 mg/dL). Liver Function Tests: Recent Labs  Lab 08/01/18 1821  AST 24  ALT 29  ALKPHOS 65  BILITOT 0.6  PROT 8.1  ALBUMIN 4.4   No results for input(s): LIPASE, AMYLASE in the last 168 hours. No results for input(s): AMMONIA in the last 168 hours. Coagulation Profile: No results for input(s): INR, PROTIME in the last 168 hours. Cardiac Enzymes: Recent Labs  Lab 08/01/18 1821  TROPONINI <0.03   BNP (last 3 results) No results for input(s): PROBNP in the last 8760 hours. HbA1C: No results for input(s): HGBA1C in the last 72 hours. CBG: No results for input(s): GLUCAP in the last 168 hours. Lipid Profile: No results for input(s): CHOL, HDL, LDLCALC, TRIG, CHOLHDL, LDLDIRECT in the last 72 hours. Thyroid Function Tests: No results for input(s): TSH, T4TOTAL, FREET4, T3FREE, THYROIDAB in the last 72 hours. Anemia Panel: No results for input(s): VITAMINB12, FOLATE, FERRITIN, TIBC, IRON, RETICCTPCT in the last 72 hours. Urine analysis:    Component Value Date/Time   COLORURINE YELLOW 05/24/2018 [place] 05/24/2018 1431   LABSPEC 1.008 05/24/2018 1431   PHURINE 8.0 05/24/2018 1431   GLUCOSEU NEGATIVE 05/24/2018 1431   HGBUR NEGATIVE 05/24/2018 1431   BILIRUBINUR NEGATIVE 05/24/2018 1431   KETONESUR NEGATIVE 05/24/2018 1431   PROTEINUR NEGATIVE 05/24/2018 1431   NITRITE NEGATIVE 05/24/2018 1431   LEUKOCYTESUR MODERATE (A) 05/24/2018 1431   Sepsis Labs: @LABRCNTIP (procalcitonin:4,lacticidven:4) )No  results found for this or any previous visit (from the past 240 hour(s)).   Radiological Exams on Admission: Dg Chest 2 View  Result Date: 08/01/2018 CLINICAL DATA:  Worsening dyspnea and chest pain x2 weeks. EXAM: CHEST - 2 VIEW COMPARISON:  CT 07/31/2018, CXR 01/18/2018 FINDINGS: The heart size and mediastinal contours are within normal limits. Mild uncoiling of the thoracic aorta without aneurysm. Both lungs are clear. The visualized skeletal structures are unremarkable. IMPRESSION: No active cardiopulmonary disease. Electronically Signed   By: Ashley Royalty M.D.   On: 08/01/2018 18:06   Ct Angio Chest Pe W Or Wo Contrast  Result Date: 07/31/2018 CLINICAL DATA:  Shortness of breath for 2 1/2 weeks with increased heart rate. History of partial LEFT nephrectomy. History of RIGHT breast cancer. EXAM: CT ANGIOGRAPHY CHEST WITH CONTRAST TECHNIQUE: Multidetector CT imaging of the chest was performed using the standard protocol during bolus administration of intravenous contrast. Multiplanar CT image reconstructions and MIPs were obtained to evaluate the vascular anatomy. CONTRAST:  165m ISOVUE-370 IOPAMIDOL (ISOVUE-370) INJECTION 76% COMPARISON:  01/18/2018 FINDINGS: Cardiovascular: The heart size is normal. Pulmonary arteries are well opacified by contrast bolus. There is no acute pulmonary embolus. The main pulmonary artery is 3.3 centimeters. Findings are consistent with pulmonary arterial hypertension. Mediastinum/Nodes: The visualized portion of the thyroid gland has a normal appearance. No mediastinal, hilar, or axillary adenopathy. Esophagus is normal. Lungs/Pleura: Lungs are clear. No pleural effusion or pneumothorax. Upper Abdomen: Unremarkable. Musculoskeletal: There are mild degenerative changes in the midthoracic spine. No suspicious lytic or  blastic lesions are identified. Postoperative changes are identified in the RIGHT breast Review of the MIP images confirms the above findings. IMPRESSION: 1.  Technically adequate exam showing no acute pulmonary embolus. 2. Enlarged pulmonary arteries consistent with pulmonary arterial hypertension. 3. Postoperative changes in the RIGHT breast consistent with RIGHT lumpectomy. Electronically Signed   By: Nolon Nations M.D.   On: 07/31/2018 16:50    EKG: Independently reviewed.  Sinus rhythm with flattened T waves  Assessment/Plan: Principal Problem:   Exertional chest pain Active Problems:   Hypertension   Obesity (BMI 35.0-39.9 without comorbidity)   Fibromyalgia   GERD (gastroesophageal reflux disease)   Anxiety and depression   Renal cancer, left [facility])    This patient was discussed with the ED physician, including pertinent vitals, physical exam findings, labs, and imaging.  We also discussed care given by the ED provider.  1. Exertional chest pain a. Observation b. Telemetry monitoring c. Serial troponins d. Echocardiogram in the morning 2. Hypertension a. Continue antihypertensives 3. Obesity 4. Fibromyalgia a. Continue tramadol 5. GERD a. Continue home medicines 6. Anxiety depression a. Continue home medicines 7. Renal cell carcinoma status post partial nephrectomy a. No evidence of infection  DVT prophylaxis: Lovenox Consultants: None Code Status: Full code Family Communication: None Disposition Plan: Patient should be able to return home following rule out   Truett Mainland, DO [facility] Pager [phone]  If 7PM-7AM, please contact night-coverage [email or web address] Password [email or web address]

## 2018-08-02 ENCOUNTER — Observation Stay (HOSPITAL_BASED_OUTPATIENT_CLINIC_OR_DEPARTMENT_OTHER): Payer: BLUE CROSS/BLUE SHIELD

## 2018-08-02 DIAGNOSIS — I10 Essential (primary) hypertension: Secondary | ICD-10-CM

## 2018-08-02 DIAGNOSIS — F419 Anxiety disorder, unspecified: Secondary | ICD-10-CM

## 2018-08-02 DIAGNOSIS — E669 Obesity, unspecified: Secondary | ICD-10-CM

## 2018-08-02 DIAGNOSIS — R079 Chest pain, unspecified: Secondary | ICD-10-CM | POA: Diagnosis not present

## 2018-08-02 DIAGNOSIS — F329 Major depressive disorder, single episode, unspecified: Secondary | ICD-10-CM

## 2018-08-02 LAB — TROPONIN I

## 2018-08-02 LAB — BLOOD GAS, ARTERIAL
Acid-base deficit: 2.7 mmol/L — ABNORMAL HIGH (ref 0.0–2.0)
BICARBONATE: 23.1 mmol/L (ref 20.0–28.0)
DRAWN BY: 331001
FIO2: 21
O2 SAT: 99.2 %
PO2 ART: 126 mmHg — AB (ref 83.0–108.0)
Patient temperature: 37
pCO2 arterial: 27.1 mmHg — ABNORMAL LOW (ref 32.0–48.0)
pH, Arterial: 7.485 — ABNORMAL HIGH (ref 7.350–7.450)

## 2018-08-02 LAB — ECHOCARDIOGRAM COMPLETE
Height: 68 in
WEIGHTICAEL: 3502.67 [oz_av]

## 2018-08-02 MED ORDER — TOPIRAMATE 25 MG PO TABS
50.0000 mg | ORAL_TABLET | Freq: Every evening | ORAL | Status: DC
  Administered 2018-08-02: 50 mg via ORAL
  Filled 2018-08-02: qty 2

## 2018-08-02 MED ORDER — PRAMIPEXOLE DIHYDROCHLORIDE 0.25 MG PO TABS
0.2500 mg | ORAL_TABLET | Freq: Every day | ORAL | Status: DC
  Filled 2018-08-02 (×3): qty 1

## 2018-08-02 MED ORDER — PRAMIPEXOLE DIHYDROCHLORIDE 0.25 MG PO TABS
0.1250 mg | ORAL_TABLET | Freq: Every day | ORAL | Status: DC
  Administered 2018-08-02: 0.125 mg via ORAL
  Filled 2018-08-02 (×2): qty 1

## 2018-08-02 MED ORDER — TOPIRAMATE 25 MG PO TABS
25.0000 mg | ORAL_TABLET | Freq: Every day | ORAL | Status: DC
  Administered 2018-08-02: 25 mg via ORAL
  Filled 2018-08-02: qty 1

## 2018-08-02 NOTE — Progress Notes (Signed)
*  PRELIMINARY RESULTS* Echocardiogram 2D Echocardiogram has been performed.  Leavy Cella 08/02/2018, 8:15 AM

## 2018-08-02 NOTE — Discharge Summary (Signed)
Discharge Summary  Sarah Cooley UGQ:[id number] DOB: [birth year]  PCP: Celene Squibb, MD  Admit date: 08/01/2018 Discharge date: 08/02/2018  Time spent: 45 minutes  Recommendations for Outpatient Follow-up:  1. Patient will follow-up with Dr. Jannifer Franklin, neurology to discuss whether her Topamax could be the cause of her severe fatigue and exercise intolerance 2. Patient previously had been going to [facility] counseling, but had not been the past few months since her surgery and her previous counselor has since retired.  She will follow-up with them to get reestablished with a new counselor and start going on a regularly scheduled basis.  Discharge Diagnoses:  Active Hospital Problems   Diagnosis Date Noted  . Exertional chest pain 08/01/2018  . Renal cancer, left ([place]) 07/31/2018  . Fibromyalgia 11/09/2016  . GERD (gastroesophageal reflux disease) 11/09/2016  . Anxiety and depression 11/09/2016  . Hypertension 07/17/2015  . Obesity (BMI 35.0-39.9 without comorbidity) 07/17/2015    Resolved Hospital Problems  No resolved problems to display.    Discharge Condition: Improved, being discharged home  Diet recommendation: Low-sodium  Vitals:   08/02/18 0442 08/02/18 1448  BP: 120/73 (!) 132/96  Pulse: 66 76  Resp: 17 18  Temp: 98.1 F (36.7 C) 98.6 F (37 C)  SpO2: 99% 99%    History of present illness:  Sarah Cooley is a 52 y.o. female with a history of 2 mm ACA brain aneurysm, history of breast cancer, chronic kidney disease, anxiety, fibromyalgia, renal cell carcinoma status post partial nephrectomy on 06/19/2018.  Patient presents with 2 weeks of worsening exertional chest pain across her entire chest with radiation into her left jaw.  Her symptoms last for a few moments when she exerts herself and improves with rest.  No other palliating or provoking factors.  Her symptoms are worsening and have been becoming more frequent.  She saw her primary care physician who  thought that this was due to her recent surgery and recovery.  Her oncologist ordered a CTA yesterday to rule out PE.  The CTA was normal.  It was noted that she had an enlarged pulmonary artery.  Hospital Course:  Principal Problem:   Exertional chest pain: Met with patient, she describes it more as just significant severe fatigue and dyspnea on exertion.  Enzymes x3-.  EKG unrevealing.  She said pain was very minimal.  ABG done noted excellent oxygenation on room air.  Echocardiogram also completely normal with normal systolic and diastolic function, normal valves and pulmonary artery normal with no signs of pulmonary hypertension.    Given these findings, I reviewed patient's medication list.  She is on a number of medications including Mirapex, Topamax, Prozac and Cozaar all of which can cause severe fatigue.  However, her Prozac she has been on for many years and Mirapex was just started a few days prior.  Her Cozaar is on a low dose and her blood pressure well controlled, it was not significantly low enough but this would be suspect.  It is possible that her Topamax which was started in the early summer could be the cause of this fatigue which may match up with timeframe.    The other possibility would be more nonmedication and emotional stress induced.  Patient has history of fibromyalgia, physical pain symptoms secondary to emotional stressors.  Patient states that the number of stressors in her life have continued significantly including finances, family and work.  These are ongoing.  She was seeing a counselor regularly with [facility],  but following her surgery, had not seen her counselor in a while and then her counselor retired.  I discussed with the patient that it is not medication related, she may be having similar fatigue symptoms as a symptom of depression and stress.  Her best bet would be to resume counseling and to treat it like medication where she should not only resume,  but keep it ongoing even when feeling better.  She seemed to be quite encouraged by our discussion and plans to both discuss with her neurologist about her Topamax and resume counseling as well.  She was relieved to hear that her heart and lungs are in excellent condition. Active Problems:   Hypertension: Continue Cozaar  obesity (BMI 35.0-39.9 without comorbidity): Meets criteria with BMI greater than 30   Fibromyalgia: See above   GERD (gastroesophageal reflux disease)   Anxiety and depression: Continue Prozac.  See above   Renal cancer, left [facility]): Status post nephrectomy   Procedures:  Echocardiogram done 40/98: Normal systolic and diastolic function.  Normal valves.  Consultations:  None  Discharge Exam: BP (!) 132/96   Pulse 76   Temp 98.6 F (37 C) (Oral)   Resp 18   Ht '5\' 8"'$  (1.727 m)   Wt 99.3 kg   SpO2 99%   BMI 33.29 kg/m   General: Alert and oriented x3, no acute distress Cardiovascular: Regular rate and rhythm, S1-S2 Respiratory: Clear to auscultation bilaterally  Discharge Instructions You were cared for by a hospitalist during your hospital stay. If you have any questions about your discharge medications or the care you received while you were in the hospital after you are discharged, you can call the unit and asked to speak with the hospitalist on call if the hospitalist that took care of you is not available. Once you are discharged, your primary care physician will handle any further medical issues. Please note that NO REFILLS for any discharge medications will be authorized once you are discharged, as it is imperative that you return to your primary care physician (or establish a relationship with a primary care physician if you do not have one) for your aftercare needs so that they can reassess your need for medications and monitor your lab values.  Discharge Instructions    Diet - low sodium heart healthy   Complete by:  As directed    Increase activity  slowly   Complete by:  As directed      Allergies as of 08/02/2018      Reactions   Ramipril Nausea And Vomiting, Other (See Comments)   Ramipril--Caused Pancreatitis   Shrimp [shellfish Allergy] Anaphylaxis, Swelling   Minocycline Hcl Hives   Altace [ramipril] Nausea And Vomiting, Nausea Only   Drug induced pancreatitis    Lyrica [pregabalin] Nausea And Vomiting   Other    ADHESIVE GLUE USED FOR INCISIONS   Adhesive [tape] Rash   Please use paper tape Patient is allergic to many adhesives    Bactrim [sulfamethoxazole-trimethoprim] Other (See Comments)   May have caused kidney issues   Drixoral [brompheniramine-pseudoeph] Rash   Erythromycin Other (See Comments)   GI- Upset / severe abdominal pain   Minocin [minocycline Hcl] Nausea And Vomiting, Rash   Septra [sulfamethoxazole-trimethoprim] Rash   Sinutab [chlorphen-pseudoephed-apap] Rash      Medication List    TAKE these medications   calcium carbonate 500 MG chewable tablet Commonly known as:  TUMS - dosed in mg elemental calcium Chew 1 tablet (200 mg of elemental  calcium total) by mouth 2 (two) times daily with breakfast and lunch.   dicyclomine 20 MG tablet Commonly known as:  BENTYL Take 20 mg by mouth as needed.   FLUoxetine 20 MG capsule Commonly known as:  PROZAC Take 60 mg by mouth daily.   furosemide 20 MG tablet Commonly known as:  LASIX Take 1 tablet (20 mg total) by mouth daily as needed for edema.   hydrOXYzine 25 MG tablet Commonly known as:  ATARAX/VISTARIL Take 25 mg by mouth every 6 (six) hours as needed.   losartan 25 MG tablet Commonly known as:  COZAAR Take 25 mg by mouth daily.   OVER THE COUNTER MEDICATION Take 1 capsule by mouth daily as needed. Diphenhydramate Over The Counter Anti-emetic   pramipexole 0.125 MG tablet Commonly known as:  MIRAPEX One tablet at dinnertime and 2 at bedtime   pravastatin 10 MG tablet Commonly known as:  PRAVACHOL Take 10 mg by mouth daily.     topiramate 25 MG tablet Commonly known as:  TOPAMAX Take 1 tablet (25 mg total) by mouth See admin instructions. Take one in the morning and 2 in the evening   traMADol 50 MG tablet Commonly known as:  ULTRAM Take 1 tablet (50 mg total) by mouth every 6 (six) hours as needed.   Vitamin D (Ergocalciferol) 50000 units Caps capsule Commonly known as:  DRISDOL Take 50,000 Units by mouth every 7 (seven) days. Take 1 tablet (50,000 units) by mouth every Thursday      Allergies  Allergen Reactions  . Ramipril Nausea And Vomiting and Other (See Comments)    Ramipril--Caused Pancreatitis  . Shrimp [Shellfish Allergy] Anaphylaxis and Swelling  . Minocycline Hcl Hives  . Altace [Ramipril] Nausea And Vomiting and Nausea Only    Drug induced pancreatitis   . Lyrica [Pregabalin] Nausea And Vomiting  . Other     ADHESIVE GLUE USED FOR INCISIONS  . Adhesive [Tape] Rash    Please use paper tape Patient is allergic to many adhesives   . Bactrim [Sulfamethoxazole-Trimethoprim] Other (See Comments)    May have caused kidney issues  . Drixoral [Brompheniramine-Pseudoeph] Rash  . Erythromycin Other (See Comments)    GI- Upset / severe abdominal pain  . Minocin [Minocycline Hcl] Nausea And Vomiting and Rash  . Septra [Sulfamethoxazole-Trimethoprim] Rash  . Sinutab [Chlorphen-Pseudoephed-Apap] Rash   Follow-up Information    Kathrynn Ducking, MD. Schedule an appointment as soon as possible for a visit in 1 month(s).   Specialty:  Neurology Why:  Follow-up to discuss about Topamax and whether it could be causing severe fatigue/exercise intolerance Contact information: [address] [place] [address] [facility] up.   Why:  Please get reestablished to restart counseling on regularly scheduled basis.           The results of significant diagnostics from this hospitalization (including imaging, microbiology, ancillary and  laboratory) are listed below for reference.    Significant Diagnostic Studies: Dg Chest 2 View  Result Date: 08/01/2018 CLINICAL DATA:  Worsening dyspnea and chest pain x2 weeks. EXAM: CHEST - 2 VIEW COMPARISON:  CT 07/31/2018, CXR 01/18/2018 FINDINGS: The heart size and mediastinal contours are within normal limits. Mild uncoiling of the thoracic aorta without aneurysm. Both lungs are clear. The visualized skeletal structures are unremarkable. IMPRESSION: No active cardiopulmonary disease. Electronically Signed   By: Ashley Royalty M.D.   On: 08/01/2018 18:06   Ct  Angio Chest Pe W Or Wo Contrast  Result Date: 07/31/2018 CLINICAL DATA:  Shortness of breath for 2 1/2 weeks with increased heart rate. History of partial LEFT nephrectomy. History of RIGHT breast cancer. EXAM: CT ANGIOGRAPHY CHEST WITH CONTRAST TECHNIQUE: Multidetector CT imaging of the chest was performed using the standard protocol during bolus administration of intravenous contrast. Multiplanar CT image reconstructions and MIPs were obtained to evaluate the vascular anatomy. CONTRAST:  180m ISOVUE-370 IOPAMIDOL (ISOVUE-370) INJECTION 76% COMPARISON:  01/18/2018 FINDINGS: Cardiovascular: The heart size is normal. Pulmonary arteries are well opacified by contrast bolus. There is no acute pulmonary embolus. The main pulmonary artery is 3.3 centimeters. Findings are consistent with pulmonary arterial hypertension. Mediastinum/Nodes: The visualized portion of the thyroid gland has a normal appearance. No mediastinal, hilar, or axillary adenopathy. Esophagus is normal. Lungs/Pleura: Lungs are clear. No pleural effusion or pneumothorax. Upper Abdomen: Unremarkable. Musculoskeletal: There are mild degenerative changes in the midthoracic spine. No suspicious lytic or blastic lesions are identified. Postoperative changes are identified in the RIGHT breast Review of the MIP images confirms the above findings. IMPRESSION: 1. Technically adequate exam  showing no acute pulmonary embolus. 2. Enlarged pulmonary arteries consistent with pulmonary arterial hypertension. 3. Postoperative changes in the RIGHT breast consistent with RIGHT lumpectomy. Electronically Signed   By: ENolon NationsM.D.   On: 07/31/2018 16:50    Microbiology: No results found for this or any previous visit (from the past 240 hour(s)).   Labs: Basic Metabolic Panel: Recent Labs  Lab 08/01/18 1821  NA 140  K 3.4*  CL 109  CO2 21*  GLUCOSE 95  BUN 12  CREATININE 0.79  CALCIUM 9.3   Liver Function Tests: Recent Labs  Lab 08/01/18 1821  AST 24  ALT 29  ALKPHOS 65  BILITOT 0.6  PROT 8.1  ALBUMIN 4.4   No results for input(s): LIPASE, AMYLASE in the last 168 hours. No results for input(s): AMMONIA in the last 168 hours. CBC: Recent Labs  Lab 08/01/18 1821  WBC 6.4  NEUTROABS 3.5  HGB 13.5  HCT 40.6  MCV 92.5  PLT 285   Cardiac Enzymes: Recent Labs  Lab 08/01/18 1821 08/02/18 0102 08/02/18 0526 08/02/18 1027  TROPONINI <0.03 <0.03 <0.03 <0.03   BNP: BNP (last 3 results) Recent Labs    08/01/18 1822  BNP 21.0    ProBNP (last 3 results) No results for input(s): PROBNP in the last 8760 hours.  CBG: No results for input(s): GLUCAP in the last 168 hours.     Signed:  SAnnita Brod MD [facility] 08/02/2018, 6:12 PM

## 2018-08-02 NOTE — Progress Notes (Signed)
Patient discharged home and instructions given on medications and follow up visits,patient verbalized understanding.No c/o pain or discomfort noted. Accompanied by staff to an awaiting vehicle.

## 2018-08-04 DIAGNOSIS — M25512 Pain in left shoulder: Secondary | ICD-10-CM | POA: Diagnosis not present

## 2018-08-04 NOTE — Telephone Encounter (Signed)
Patient was admitted over the weekend and discharged, can she get a hospital f/u 4 weeks with PA   Zandra Abts MD

## 2018-08-05 NOTE — Telephone Encounter (Signed)
I have forwarded Dr Nelly Laurence message to front office to schedule hospital f/u

## 2018-08-06 ENCOUNTER — Telehealth: Payer: Self-pay | Admitting: Neurology

## 2018-08-06 NOTE — Telephone Encounter (Signed)
The patient was seen and evaluated for shortness of breath and increased heart rate, she was told that the Topamax was the cause of this, the patient has been on this since June 2019.  I have never seen this type of reaction to Topamax, it is not listed on the adverse reactions.  The patient is tapering off the Topamax, we will need to consider another medication for headaches in the future, she will be seen on 28 October for EMG evaluation.

## 2018-08-06 NOTE — Telephone Encounter (Signed)
Pt called stating on 10/11 she stayed overnight at [facility], due to increased HR, numbness/tingling, all over "jiddery" ED doc felt this was in regards to pts Topamax. Requesting a call to discuss in further detail

## 2018-08-07 ENCOUNTER — Encounter (HOSPITAL_COMMUNITY)
Admission: RE | Admit: 2018-08-07 | Discharge: 2018-08-07 | Disposition: A | Discharge status: Home / Self Care | Payer: BLUE CROSS/BLUE SHIELD | Source: Ambulatory Visit | Attending: Internal Medicine | Admitting: Internal Medicine

## 2018-08-07 ENCOUNTER — Encounter (HOSPITAL_COMMUNITY): Payer: Self-pay

## 2018-08-07 DIAGNOSIS — M25559 Pain in unspecified hip: Secondary | ICD-10-CM | POA: Diagnosis not present

## 2018-08-07 DIAGNOSIS — Z853 Personal history of malignant neoplasm of breast: Secondary | ICD-10-CM | POA: Diagnosis not present

## 2018-08-07 DIAGNOSIS — R0602 Shortness of breath: Secondary | ICD-10-CM | POA: Diagnosis not present

## 2018-08-07 MED ORDER — TECHNETIUM TC 99M MEDRONATE IV KIT
20.0000 | PACK | Freq: Once | INTRAVENOUS | Status: AC | PRN
  Administered 2018-08-07: 20 via INTRAVENOUS

## 2018-08-12 ENCOUNTER — Telehealth (HOSPITAL_COMMUNITY): Payer: Self-pay | Admitting: General Practice

## 2018-08-12 DIAGNOSIS — M797 Fibromyalgia: Secondary | ICD-10-CM | POA: Diagnosis not present

## 2018-08-12 DIAGNOSIS — R079 Chest pain, unspecified: Secondary | ICD-10-CM | POA: Diagnosis not present

## 2018-08-12 DIAGNOSIS — I272 Pulmonary hypertension, unspecified: Secondary | ICD-10-CM | POA: Diagnosis not present

## 2018-08-12 DIAGNOSIS — R0602 Shortness of breath: Secondary | ICD-10-CM | POA: Diagnosis not present

## 2018-08-12 NOTE — Telephone Encounter (Signed)
[facility] CSW Progress Notes  Referral received from Dr Walden Field, patient concerned about her ability to return to work, wants information on filing for disability.  CSW spoke w patient by phone.  Patient has had multiple medical problems over past several years, including kidney cancer and DCIS.  Although doing well w these diagnoses, has also suffered from chronic health concerns including fibromyalgia, C diff infection, IBS, sleep apnea, hypertension and possible pulmonary hypertension.  Has been consulting various providers, no clear treatment plan for resolution of medical symptoms.  Has contacted lawyer to discuss options for filing for disability as she does not think she will be able to return to work on permanent basis due to medical issues.  Has financial concerns related to being out of work.  Has benefitted from support from outpatient therapist and medications - will need to seek new providers as former providers are no longer in practice.  Pt has contacted lawyer re initiating disability application, understanding this will take months to years to resolve.  Will contact former counseling office to ask for referral for new meds mgmt and therapy provider.  Encouraged to connect with options for cancer support including [facility] and [employer].  Will mail information packet on resources for cancer support in local area.  Encouraged to call back and access resources for support as needed.  Edwyna Shell, LCSW Clinical Social Worker Phone:  [phone]

## 2018-08-14 ENCOUNTER — Encounter (HOSPITAL_COMMUNITY): Payer: Self-pay

## 2018-08-14 ENCOUNTER — Encounter (HOSPITAL_COMMUNITY): Payer: Self-pay | Admitting: Occupational Therapy

## 2018-08-14 ENCOUNTER — Ambulatory Visit (HOSPITAL_COMMUNITY): Payer: BLUE CROSS/BLUE SHIELD | Admitting: Occupational Therapy

## 2018-08-14 ENCOUNTER — Ambulatory Visit (HOSPITAL_COMMUNITY): Payer: BLUE CROSS/BLUE SHIELD | Attending: Neurology

## 2018-08-14 ENCOUNTER — Other Ambulatory Visit: Payer: Self-pay

## 2018-08-14 DIAGNOSIS — M6281 Muscle weakness (generalized): Secondary | ICD-10-CM | POA: Diagnosis not present

## 2018-08-14 DIAGNOSIS — M5442 Lumbago with sciatica, left side: Secondary | ICD-10-CM | POA: Diagnosis not present

## 2018-08-14 DIAGNOSIS — R29898 Other symptoms and signs involving the musculoskeletal system: Secondary | ICD-10-CM

## 2018-08-14 DIAGNOSIS — G8929 Other chronic pain: Secondary | ICD-10-CM | POA: Diagnosis not present

## 2018-08-14 DIAGNOSIS — R262 Difficulty in walking, not elsewhere classified: Secondary | ICD-10-CM | POA: Insufficient documentation

## 2018-08-14 DIAGNOSIS — M79622 Pain in left upper arm: Secondary | ICD-10-CM | POA: Diagnosis not present

## 2018-08-14 NOTE — Therapy (Addendum)
[place], [place], [address] Phone: [phone]   Fax:  [phone]  Occupational Therapy Evaluation  Patient Details  Name: Sarah Cooley MRN: [id number] Date of Birth: April 29, 1966 Referring Provider (OT): Marchia Bond, MD    Encounter Date: 08/14/2018  OT End of Session - 08/14/18 1516    Visit Number  1    Number of Visits  8    Date for OT Re-Evaluation  09/11/18    Authorization Type  1) [employer] Calendar year plan (PT/OT/chiro 12 out of 30 used with 18 remaining)     Authorization - Visit Number  1    Authorization - Number of Visits  8    OT Start Time  1300    OT Stop Time  1347    OT Time Calculation (min)  47 min    Activity Tolerance  Patient tolerated treatment well    Behavior During Therapy  [facility] for tasks assessed/performed       Past Medical History:  Diagnosis Date  . Allergy    seasonal  . Anxiety   . Arthritis   . Balance problem   . Brain aneurysm    2 mm ACA region aneurysm by 09/21/15 MRA  . Breast cancer ([place])   . Breast cancer of lower-inner quadrant of right female breast ([place]) 08/26/2015  . Chronic kidney disease    acute renal failure one occasion  . Complication of anesthesia    heart rate drops and O2 sats drop  . Constipation   . Depression   . Diplopia 10/29/2017  . Dizziness 10/29/2017  . Elevated liver function tests   . Family history of adverse reaction to anesthesia    mom has n/v  . Family history of breast cancer   . Family history of colon cancer   . Fibromyalgia   . Fibromyalgia   . Gait abnormality 10/29/2017  . Genetic testing 09/08/2015   Negative genetic testing on the Breast/High Moderate Risk panel. The Breast High/Moderate Risk gene panel offered by [facility] includes sequencing and deletion/duplication analysis of the following 9 genes: ATM, BRCA1, BRCA2, CDH1, CHEK2, PALB2, PTEN, STK11, and TP53. The report date is September 08, 2015.  The Rest of the  Comprehensive Cancer panel has been reflexed and will be available in 2-3 weeks.  POLD1 c.208G>T VUS found on the Remainder of the Comprehensive cancer panel.  The Comprehensive Cancer Panel offered by [facility] includes sequencing and/or deletion duplication testing of the following 32 genes: APC, ATM, AXIN2, BARD1, BMPR1A, BRCA1, BRCA2, BRIP1, CDH1, CDK4, CDKN2A, CHEK2, EPCAM, FANCC, MLH1, MSH2, MSH6, MUTYH, NBN, PALB2, PMS2, POLD1, POLE, PTEN, RAD51C, RAD51D, SCG5/GREM1, SMAD4, STK11, TP53, VHL, and XRCC2.   The report date is 09/12/2015.   [place] Kitchen GERD (gastroesophageal reflux disease)    none recent  . H/O acute renal failure 09/22/15   admitted to [facility]  . Headache    occipital and temporal  . History of hiatal hernia   . Hyperlipemia   . Hyperlipidemia   . Hypertension   . Irritable bowel syndrome (IBS)   . MGUS (monoclonal gammopathy of unknown significance) 03/15/2016  . Neuromuscular disorder (HCC)    fibromyalgia  . Occasional tremors   . OSA (obstructive sleep apnea)    uses cpap with humidification- auto   . Personal history of radiation therapy   . PONV (postoperative nausea and vomiting)   . Renal cell carcinoma ([place])   . Tremor, essential 10/29/2017  Past Surgical History:  Procedure Laterality Date  . ABDOMINAL HYSTERECTOMY     adenomyosis  . ABDOMINAL HYSTERECTOMY    . BLADDER SURGERY    . BONE GRAFT HIP ILIAC CREST    . BREAST BIOPSY Bilateral   . BREAST LUMPECTOMY    . BREAST SURGERY    . CHOLECYSTECTOMY    . FRACTURE SURGERY Right    wrist  . KNEE ARTHROSCOPY    . KNEE ARTHROSCOPY W/ ACL RECONSTRUCTION     right  . RADIOACTIVE SEED GUIDED PARTIAL MASTECTOMY WITH AXILLARY SENTINEL LYMPH NODE BIOPSY Right 10/13/2015   Procedure: RADIOACTIVE SEED GUIDED PARTIAL MASTECTOMY WITH AXILLARY SENTINEL LYMPH NODE BIOPSY;  Surgeon: Erroll Luna, MD;  Location: [facility];  Service: General;  Laterality: Right;  . right carpal and cubital tunnel release     . ROBOTIC  ASSITED PARTIAL NEPHRECTOMY Left 06/19/2018   Procedure: XI ROBOTIC ASSITED PARTIAL NEPHRECTOMY;  Surgeon: Ardis Hughs, MD;  Location: [facility];  Service: Urology;  Laterality: Left;  . WRIST SURGERY      There were no vitals filed for this visit.  Subjective Assessment - 08/14/18 1307    Subjective   S: I'm not sure what caused my upper arm pain.    Pertinent History  Pt is a 52 y/o female presenting with left shoulder pain since approximately 04/05/18 for unknown reasons. She reports falling on right arm during March 2019, which may be related to left shoulder pain due to compensation after fall using left arm. Pt reports she is unable to use left arm for any reaching tasks overhead, across, or behind body. Pt was referred by Marchia Bond, MD for evaluation and treatment of left arm pain.    Special Tests  FOTO 45/100    Patient Stated Goals  To be able to use left arm again without pain.     Currently in Pain?  Yes    Pain Score  6     Pain Location  Arm    Pain Orientation  Left    Pain Descriptors / Indicators  Aching    Pain Type  Acute pain    Pain Radiating Towards  N/A    Pain Onset  More than a month ago    Pain Frequency  Intermittent    Aggravating Factors   Lifting up and reaching with arm    Pain Relieving Factors  Medication     Effect of Pain on Daily Activities  Mod effect on daily activities.     Multiple Pain Sites  No        [facility] OT Assessment - 08/14/18 1309      Assessment   Medical Diagnosis  Left arm pain    Referring Provider (OT)  Marchia Bond, MD     Onset Date/Surgical Date  04/05/18   Approximate date   Hand Dominance  Right    Next MD Visit  N/A    Prior Therapy  None      Precautions   Precautions  Fall    Precaution Comments  History of falls related to balance issues/fatigue      Balance Screen   Has the patient fallen in the past 6 months  Yes    How many times?  1    Has the patient had a decrease in activity level because of a  fear of falling?   Yes    Is the patient reluctant to leave their home because of a fear  of falling?   No      Prior Function   Level of Independence  Independent;Other (comment)   Walking stick, shower bars & seat due to balance/fatigue prn   Vocation  Unemployed   Trying to get on disability    Leisure  Pt enjoys cooking, playing the piano, and meeting with family for dinners.      ADL   ADL comments  Pt with pain and difficulty using left arm to reach overhead and behind her to wash her hair and backside in the shower, when donning shirts, and while grooming hair which limits her functional independence.        Mobility   Mobility Status  History of falls      Written Expression   Dominant Hand  Right      Vision - History   Baseline Vision  Wears glasses all the time      Cognition   Overall Cognitive Status  Within Functional Limits for tasks assessed      Observation/Other Assessments   Focus on Therapeutic Outcomes (FOTO)   45/100      ROM / Strength   AROM / PROM / Strength  AROM;PROM;Strength      Palpation   Palpation comment  Moderate fascial restrictions in the left upper arm, deltoid, and trapezius regions.      AROM   Overall AROM   Within functional limits for tasks performed    Overall AROM Comments   Assessed seated, er/IR adducted     AROM Assessment Site  Shoulder    Right/Left Shoulder  Left    Left Shoulder Flexion  135 Degrees    Left Shoulder ABduction  150 Degrees    Left Shoulder Internal Rotation  90 Degrees    Left Shoulder External Rotation  60 Degrees      PROM   Overall PROM   Within functional limits for tasks performed    Overall PROM Comments   Assessed supine, er/IR adducted     PROM Assessment Site  Shoulder    Right/Left Shoulder  Left    Left Shoulder Flexion  170 Degrees    Left Shoulder ABduction  160 Degrees    Left Shoulder Internal Rotation  90 Degrees    Left Shoulder External Rotation  90 Degrees      Strength    Overall Strength  Within functional limits for tasks performed    Overall Strength Comments   Assessed seated, er/IR adducted     Strength Assessment Site  Shoulder    Right/Left Shoulder  Left    Left Shoulder Flexion  4+/5    Left Shoulder ABduction  4+/5    Left Shoulder Internal Rotation  4+/5    Left Shoulder External Rotation  4+/5                      OT Education - 08/14/18 1516    Education Details  Shoulder Stretches HEP.    Person(s) Educated  Patient    Methods  Explanation;Demonstration;Handout;Verbal cues    Comprehension  Verbalized understanding;Returned demonstration       OT Short Term Goals - 08/14/18 1550      OT SHORT TERM GOAL #1   Title  Pt will be educated on and independent in HEP for improving functional use of LUE during ADLs.      Time  4    Period  Weeks    Status  New  Target Date  09/11/18      OT SHORT TERM GOAL #2   Title  Pt will improve LUE A/ROM to WNL to improve ability to perform dressing tasks.      Time  4    Period  Weeks    Status  New      OT SHORT TERM GOAL #3   Title  Pt will decrease pain to 3/10 or less to improve ability to perform functional reaching activities while showering.      Time  4    Period  Weeks    Status  New      OT SHORT TERM GOAL #4   Title   Pt will improve strength to 5/5 to improve ability to complete household chores.      Time  4    Period  Weeks    Status  New      OT SHORT TERM GOAL #5   Title  Pt will decrease fascial restriction to trace amounts in order to be able to reach her high kitchen cabinets while cooking.     Time  4    Period  Weeks    Status  New               Plan - 08/14/18 1519    Clinical Impression Statement  A: Pt is a 52 y/o female presenting with left upper arm pain causing increased fascial restrictions, pain and decreased strength and ROM of the LUE resulting in difficulty completing functional daily tasks.    Occupational Profile and client  history currently impacting functional performance  Client is motivated to return to highest level of functional independence.     Occupational performance deficits (Please refer to evaluation for details):  ADL's;IADL's;Rest and Sleep;Work;Leisure    Rehab Potential  Good    Current Impairments/barriers affecting progress:  Other existing health comorbidities.     OT Frequency  2x / week    OT Duration  4 weeks    OT Treatment/Interventions  Self-care/ADL training;Therapeutic exercise;Ultrasound;Manual Therapy;Energy conservation;Therapeutic activities;Cryotherapy;Paraffin;DME and/or AE instruction;Electrical Stimulation;Moist Heat;Contrast Bath;Passive range of motion;Patient/family education    Plan  P: Patient will benefit from skilled OT services to increase functional performance using her LUE. Treatment Plan: Myofascial release, manual stretching, P/ROM, A/ROM, shoulder and scapular strengthening, and modalities PRN.      Clinical Decision Making  Limited treatment options, no task modification necessary    OT Home Exercise Plan  Shoulder Stretches HEP    Consulted and Agree with Plan of Care  Patient       Patient will benefit from skilled therapeutic intervention in order to improve the following deficits and impairments:  Cardiopulmonary status limiting activity, Decreased activity tolerance, Decreased knowledge of use of DME, Impaired flexibility, Decreased strength, Decreased range of motion, Pain, Increased fascial restrictions, Impaired UE functional use  Visit Diagnosis: Pain in left upper arm  Other symptoms and signs involving the musculoskeletal system    Problem List Patient Active Problem List   Diagnosis Date Noted  . Exertional chest pain 08/01/2018  . Family history of melanoma 07/31/2018  . Renal cancer, left ([place]) 07/31/2018  . Renal mass, left 06/19/2018  . Clostridium difficile diarrhea   . Weakness   . Hypokalemia, gastrointestinal losses 05/25/2018  .  Hypokalemia 05/24/2018  . Dizziness 10/29/2017  . Gait abnormality 10/29/2017  . Tremor, essential 10/29/2017  . Diplopia 10/29/2017  . Obstructive sleep apnea on CPAP 12/10/2016  . Plantar fasciitis, bilateral  12/10/2016  . Fibromyalgia 11/09/2016  . IBS (irritable bowel syndrome) 11/09/2016  . GERD (gastroesophageal reflux disease) 11/09/2016  . Anxiety and depression 11/09/2016  . MGUS (monoclonal gammopathy of unknown significance) 03/15/2016  . Family history of breast cancer   . Family history of colon cancer   . Breast cancer of lower-inner quadrant of right female breast ([place]) 08/26/2015  . Headache 07/17/2015  . Hypertension 07/17/2015  . Obesity (BMI 35.0-39.9 without comorbidity) 07/17/2015    Toney Rakes, OT student  08/14/2018, 5:27 PM  [place] [address] [place], [place], [address] Phone: [phone]   Fax:  [phone]  Name: Sarah Cooley MRN: [id number] Date of Birth: 12/11/1965

## 2018-08-14 NOTE — Patient Instructions (Signed)

## 2018-08-14 NOTE — Therapy (Signed)
[place], [place], [address] Phone: [phone]   Fax:  [phone]  Physical Therapy Evaluation  Patient Details  Name: Sarah Cooley MRN: [id number] Date of Birth: 1966-10-08 Referring Provider (PT): Kathrynn Ducking, MD   Encounter Date: 08/14/2018  PT End of Session - 08/14/18 1632    Visit Number  1    Number of Visits  13    Date for PT Re-Evaluation  09/25/18    Authorization Type  [employer] (30 visit limit PT/OT/Chiro combined, 12 used at eval and pt in OT; no auth required)    Authorization Time Period  08/14/18 - 09/26/18    Authorization - Visit Number  1    Authorization - Number of Visits  9    PT Start Time  1351    PT Stop Time  1431    PT Time Calculation (min)  40 min    Activity Tolerance  Patient tolerated treatment well    Behavior During Therapy  [facility] for tasks assessed/performed       Past Medical History:  Diagnosis Date  . Allergy    seasonal  . Anxiety   . Arthritis   . Balance problem   . Brain aneurysm    2 mm ACA region aneurysm by 09/21/15 MRA  . Breast cancer ([place])   . Breast cancer of lower-inner quadrant of right female breast ([place]) 08/26/2015  . Chronic kidney disease    acute renal failure one occasion  . Complication of anesthesia    heart rate drops and O2 sats drop  . Constipation   . Depression   . Diplopia 10/29/2017  . Dizziness 10/29/2017  . Elevated liver function tests   . Family history of adverse reaction to anesthesia    mom has n/v  . Family history of breast cancer   . Family history of colon cancer   . Fibromyalgia   . Fibromyalgia   . Gait abnormality 10/29/2017  . Genetic testing 09/08/2015   Negative genetic testing on the Breast/High Moderate Risk panel. The Breast High/Moderate Risk gene panel offered by [facility] includes sequencing and deletion/duplication analysis of the following 9 genes: ATM, BRCA1, BRCA2, CDH1, CHEK2, PALB2, PTEN, STK11, and  TP53. The report date is September 08, 2015.  The Rest of the Comprehensive Cancer panel has been reflexed and will be available in 2-3 weeks.  POLD1 c.208G>T VUS found on the Remainder of the Comprehensive cancer panel.  The Comprehensive Cancer Panel offered by [facility] includes sequencing and/or deletion duplication testing of the following 32 genes: APC, ATM, AXIN2, BARD1, BMPR1A, BRCA1, BRCA2, BRIP1, CDH1, CDK4, CDKN2A, CHEK2, EPCAM, FANCC, MLH1, MSH2, MSH6, MUTYH, NBN, PALB2, PMS2, POLD1, POLE, PTEN, RAD51C, RAD51D, SCG5/GREM1, SMAD4, STK11, TP53, VHL, and XRCC2.   The report date is 09/12/2015.   [place] Kitchen GERD (gastroesophageal reflux disease)    none recent  . H/O acute renal failure 09/22/15   admitted to [facility]  . Headache    occipital and temporal  . History of hiatal hernia   . Hyperlipemia   . Hyperlipidemia   . Hypertension   . Irritable bowel syndrome (IBS)   . MGUS (monoclonal gammopathy of unknown significance) 03/15/2016  . Neuromuscular disorder (HCC)    fibromyalgia  . Occasional tremors   . OSA (obstructive sleep apnea)    uses cpap with humidification- auto   . Personal history of radiation therapy   . PONV (postoperative nausea and vomiting)   .  Renal cell carcinoma ([place])   . Tremor, essential 10/29/2017    Past Surgical History:  Procedure Laterality Date  . ABDOMINAL HYSTERECTOMY     adenomyosis  . ABDOMINAL HYSTERECTOMY    . BLADDER SURGERY    . BONE GRAFT HIP ILIAC CREST    . BREAST BIOPSY Bilateral   . BREAST LUMPECTOMY    . BREAST SURGERY    . CHOLECYSTECTOMY    . FRACTURE SURGERY Right    wrist  . KNEE ARTHROSCOPY    . KNEE ARTHROSCOPY W/ ACL RECONSTRUCTION     right  . RADIOACTIVE SEED GUIDED PARTIAL MASTECTOMY WITH AXILLARY SENTINEL LYMPH NODE BIOPSY Right 10/13/2015   Procedure: RADIOACTIVE SEED GUIDED PARTIAL MASTECTOMY WITH AXILLARY SENTINEL LYMPH NODE BIOPSY;  Surgeon: Erroll Luna, MD;  Location: [facility];  Service: General;  Laterality:  Right;  . right carpal and cubital tunnel release     . ROBOTIC ASSITED PARTIAL NEPHRECTOMY Left 06/19/2018   Procedure: XI ROBOTIC ASSITED PARTIAL NEPHRECTOMY;  Surgeon: Ardis Hughs, MD;  Location: [facility];  Service: Urology;  Laterality: Left;  . WRIST SURGERY      There were no vitals filed for this visit.   Subjective Assessment - 08/14/18 1623    Subjective  Ms. Doan reports she had a fall on March 30th when she passed out at the top of a stairs at her mother's house after carrying her grandchild up the stairs in her car seat. She reports she feel down all 16 steps and hit her head. When she went to the hospital they did a CT scan and found that she had a mass on her Lt kidney. She had surgery to remove the mass on August 29th, they performed a partial nephrectomy. In addition to these events she was told she had pulmonary arterial hypertension and she believes this is related to her episode of syncope and the fall down the stairs. She denies any other falls. She reports her back pain started after the fall but was primarily on her Rt side as she landed on her Rt side. That pain improved and then 3 weeks after her nephrectomy she began to have severe pain around her Lt lower back. She states she also developed numbness and tingling down bil LE's, LT>Rt.     Pertinent History  06/19/18 Lt partial nephrectomy, breast cancer 2016, pulmonary HTN, fibromyalgia, sleep apnea    Limitations  Walking;Other (comment);Standing;Sitting;House hold activities   sleeping   Patient Stated Goals  to have less pain and be able to sleep through the night    Currently in Pain?  Yes    Pain Score  4     Pain Location  Back    Pain Orientation  Left;Lower    Pain Descriptors / Indicators  Aching;Sore    Pain Type  Chronic pain    Pain Onset  More than a month ago    Pain Frequency  Intermittent    Aggravating Factors   bending forward, prolonged positions, moving    Pain Relieving Factors  sitting  still, medicine    Effect of Pain on Daily Activities  limits sleeping         [facility] PT Assessment - 08/14/18 1408      Assessment   Medical Diagnosis  Low Back Pain    Referring Provider (PT)  Kathrynn Ducking, MD    Onset Date/Surgical Date  01/18/18    Hand Dominance  Right    Next MD  Visit  N/A    Prior Therapy  None      Precautions   Precautions  Fall    Precaution Comments  1 fall in last 6 months, likely related to pulmonary aterial HTN      Restrictions   Weight Bearing Restrictions  No      Balance Screen   Has the patient fallen in the past 6 months  Yes    How many times?  1    Has the patient had a decrease in activity level because of a fear of falling?   Yes    Is the patient reluctant to leave their home because of a fear of falling?   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 She reports recently her husband has been caring for both of her parents and providing them with transportation for doctor's appointments. She enjoys walking some with her husband. She is hoping to return part time to work on Monday.      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    Leisure  Enjoys walkin gwith her husband around neighborhood, cooking, and spending time with family       Cognition   Overall Cognitive Status  Within Functional Limits for tasks assessed      Observation/Other Assessments   Focus on Therapeutic Outcomes (FOTO)   69% limited      ROM / Strength   AROM / PROM / Strength  AROM;Strength      AROM   AROM Assessment Site  Lumbar    Lumbar Flexion  60   painful Lt Lower back   Lumbar Extension  20    Lumbar - Right Side Bend  10   pain on Lt   Lumbar - Left Side Bend  12   pain on Rt   Lumbar - Right Rotation  8    Lumbar - Left Rotation  8      Strength   Strength Assessment Site  Hip;Knee;Ankle    Right Hip Flexion  4+/5    Right Hip Extension  4+/5    Right Hip ABduction  4+/5    Left Hip Flexion  4+/5    Left Hip Extension  4-/5   painful   Left Hip ABduction  4/5    Right/Left Knee  Right;Left    Right Knee Flexion  4+/5    Right Knee Extension  5/5    Left Knee Flexion  4/5   painful   Left Knee Extension  5/5    Right Ankle Dorsiflexion  5/5    Left Ankle Dorsiflexion  5/5  Special Tests    Special Tests  Lumbar    Lumbar Tests  Slump Test      Slump test   Findings  Positive    Side  Left    Comment  pain and burning/numbness and tingling in Lt LE from buttock to foot       Objective measurements completed on examination: See above findings.    [place] Adult PT Treatment/Exercise - 08/14/18 0001      Exercises   Exercises  Lumbar      Lumbar Exercises: Stretches   Prone on Elbows Stretch  10 seconds;Limitations    Prone on Elbows Stretch Limitations  10 reps        PT Education - 08/14/18 1631    Education Details  Educated on overall exam findings and presentation and appropriate plan for therapy. Provided initial HEP.    Person(s) Educated  Patient    Methods  Explanation;Handout    Comprehension  Verbalized understanding;Returned demonstration       PT Short Term Goals - 08/14/18 1636      PT SHORT TERM GOAL #1   Title  Patient will be independent with HEP, updated PRN, to improve LE  strength to improve posture and reduce LBP with mobility.    Time  2    Period  Weeks    Status  New    Target Date  08/28/18        PT Long Term Goals - 08/14/18 1638      PT LONG TERM GOAL #1   Title  Patient will improve MMT to 5/5 thorughout bil LE to demonstrate significant improvement in functional strength to improve activity tolerance and endurance.    Time  4    Period  Weeks    Status  New    Target Date  09/11/18      PT LONG TERM GOAL #2   Title  Patient will improve lumbar ROM to WNL's for all planes and be pain free throughout to demosntrate improved mobility and position tolerance.    Time  4    Period  Weeks    Status  New      PT LONG TERM GOAL #3   Title  Patient will deny symptoms radiating beyond knees to demonstrate centralizaiton of radicular symptoms in bil LE's to indicate reduce sciatic nerve comrpession and reduce patient's pain.     Time  4    Period  Weeks    Status  New      PT LONG TERM GOAL #4   Title  Patient will reports being able to sleep through the night for 1 week straight without and increase in pain to improve QOL and overal health/wellness.    Time  4    Period  Weeks    Status  New        Plan - 08/14/18 1633    Clinical Impression Statement  Ms. Habermann presents for physical therapy evaluation for low back pain. She has previously been seen in our clinic for vestibular rehab and reports that her vision and dizziness is much improved. She has a significant medical history for cancer including mass removal via partial nephrectomy on Lt kidney on 06/19/18. She has been experiencing increased LBP since ~ 3 weeks after her surgery. She reports her back bothers her when she moves for long periods and is especially painful at night. She presents with LE weakness, Lt>Rt, limited lumbar mobility, symptom provocation with PA's  to lumbar spine, positive Slump test on Lt LE, and impaired balance. She has greater irritation in flexion postures and  has relief with prone on elbow stretch. Overall Ms. Brassfield demonstrates possible findings of lumbar disc involvement with Lt sciatic nerve involvement. She will benefit from skilled PT interventions to address impairments and progress towards goals.     Clinical Presentation  Stable    Clinical Presentation due to:  ROM, MMT, SLS, FOTO, clinical judgement    Clinical Decision Making  Low    Rehab Potential  Good    PT Frequency  2x / week    PT Duration  6 weeks    PT Treatment/Interventions  ADLs/Self Care Home Management;Aquatic Therapy;Cryotherapy;Electrical Stimulation;Moist Heat;Traction;DME Instruction;Gait training;Stair training;Functional mobility training;Therapeutic activities;Therapeutic exercise;Balance training;Neuromuscular re-education;Patient/family education;Manual techniques;Passive range of motion;Taping;Spinal Manipulations;Joint Manipulations    PT Next Visit Plan  Review eval and goals. Perform 2 MWT and monitor BP and HR throughout. Initaite PA's to lumbar spine if with PT and perform extension based exercises initially. Initiate hip strengthening and core stabilization and perform manual soft tissue work PRN along Lt sciatic nerve tract.    PT Home Exercise Plan  Eval: prone on elbows    Recommended Other Services  pulmonary rehab    Consulted and Agree with Plan of Care  Patient       Patient will benefit from skilled therapeutic intervention in order to improve the following deficits and impairments:  Decreased endurance, Decreased range of motion, Decreased strength, Impaired sensation, Pain, Postural dysfunction, Difficulty walking, Cardiopulmonary status limiting activity, Decreased balance, Decreased mobility, Decreased activity tolerance, Hypomobility, Obesity  Visit Diagnosis: Chronic bilateral low back pain with left-sided sciatica  Muscle weakness (generalized)  Difficulty in walking, not elsewhere classified     Problem List Patient Active Problem List    Diagnosis Date Noted  . Exertional chest pain 08/01/2018  . Family history of melanoma 07/31/2018  . Renal cancer, left ([place]) 07/31/2018  . Renal mass, left 06/19/2018  . Clostridium difficile diarrhea   . Weakness   . Hypokalemia, gastrointestinal losses 05/25/2018  . Hypokalemia 05/24/2018  . Dizziness 10/29/2017  . Gait abnormality 10/29/2017  . Tremor, essential 10/29/2017  . Diplopia 10/29/2017  . Obstructive sleep apnea on CPAP 12/10/2016  . Plantar fasciitis, bilateral 12/10/2016  . Fibromyalgia 11/09/2016  . IBS (irritable bowel syndrome) 11/09/2016  . GERD (gastroesophageal reflux disease) 11/09/2016  . Anxiety and depression 11/09/2016  . MGUS (monoclonal gammopathy of unknown significance) 03/15/2016  . Family history of breast cancer   . Family history of colon cancer   . Breast cancer of lower-inner quadrant of right female breast ([place]) 08/26/2015  . Headache 07/17/2015  . Hypertension 07/17/2015  . Obesity (BMI 35.0-39.9 without comorbidity) 07/17/2015    Kipp Brood, PT, DPT Physical Therapist with [facility]  08/14/2018 6:11 PM    [place], [place], [address] Phone: [phone]   Fax:  [phone]  Name: ANNEKE CUNDY MRN: [id number] Date of Birth: [birth year]

## 2018-08-18 ENCOUNTER — Ambulatory Visit: Payer: BLUE CROSS/BLUE SHIELD | Admitting: Neurology

## 2018-08-18 ENCOUNTER — Encounter: Payer: Self-pay | Admitting: Neurology

## 2018-08-18 ENCOUNTER — Ambulatory Visit (INDEPENDENT_AMBULATORY_CARE_PROVIDER_SITE_OTHER): Payer: BLUE CROSS/BLUE SHIELD | Admitting: Neurology

## 2018-08-18 DIAGNOSIS — M545 Low back pain: Secondary | ICD-10-CM | POA: Diagnosis not present

## 2018-08-18 DIAGNOSIS — M797 Fibromyalgia: Secondary | ICD-10-CM

## 2018-08-18 DIAGNOSIS — G8929 Other chronic pain: Secondary | ICD-10-CM

## 2018-08-18 NOTE — Procedures (Signed)
     HISTORY:  Sarah Cooley is a 52 year old patient with a history of chronic low back pain and pain down both legs, left greater than right.  The patient is being evaluated for a possible neuropathy or a lumbar radiculopathy.  NERVE CONDUCTION STUDIES:  Nerve conduction studies were performed on both lower extremities. The distal motor latencies and motor amplitudes for the peroneal and posterior tibial nerves were within normal limits. The nerve conduction velocities for these nerves were also normal. The sensory latencies for the peroneal and sural nerves were within normal limits. The F wave latencies for the posterior tibial nerves were within normal limits.   EMG STUDIES:  EMG study was performed on the left lower extremity:  The tibialis anterior muscle reveals 2 to 4K motor units with full recruitment. No fibrillations or positive waves were seen. The peroneus tertius muscle reveals 2 to 4K motor units with full recruitment. No fibrillations or positive waves were seen. The medial gastrocnemius muscle reveals 1 to 3K motor units with full recruitment. No fibrillations or positive waves were seen. The vastus lateralis muscle reveals 2 to 4K motor units with full recruitment. No fibrillations or positive waves were seen. The iliopsoas muscle reveals 2 to 4K motor units with full recruitment. No fibrillations or positive waves were seen. The biceps femoris muscle (long head) reveals 2 to 4K motor units with full recruitment. No fibrillations or positive waves were seen. The lumbosacral paraspinal muscles were tested at 3 levels, and revealed no abnormalities of insertional activity at all 3 levels tested. There was good relaxation.   IMPRESSION:  Nerve conduction studies done on both lower extremities were within normal limits.  No evidence of a neuropathy was seen.  EMG evaluation of the left lower extremity was unremarkable, no evidence of an overlying lumbosacral radiculopathy  was seen.  Jill Alexanders MD 08/18/2018 9:25 AM  [facility] [address] [place], [place] [address]  Phone [phone] Fax [phone]

## 2018-08-18 NOTE — Progress Notes (Addendum)
The patient comes in today for EMG nerve conduction study evaluation.  She is in physical therapy for her back which is helpful.  The back pain is still there but is not as severe as it had been initially, she has left greater than right leg discomfort.  The patient will continue her physical therapy, if the pain does not significantly improve, MRI of the lumbar spine can be done.  EMG and nerve conduction study done today is completely normal.  The patient has had episodes of increased heart rate, she has been placed on diltiazem which seems to help her blood pressure and slow her heart rate down.  2D echocardiogram done was unremarkable.       [place]    Nerve / Sites Muscle Latency Ref. Amplitude Ref. Rel Amp Segments Distance Velocity Ref. Area    ms ms mV mV %  cm m/s m/s mVms  R Peroneal - EDB     Ankle EDB 4.5 ?6.5 5.5 ?2.0 100 Ankle - EDB 9   20.5     Fib head EDB 11.3  5.0  90 Fib head - Ankle 33 49 ?44 19.1     Pop fossa EDB 13.3  4.9  97.9 Pop fossa - Fib head 10 49 ?44 19.3         Pop fossa - Ankle      L Peroneal - EDB     Ankle EDB 5.1 ?6.5 4.8 ?2.0 100 Ankle - EDB 9   19.7     Fib head EDB 12.2  4.4  90.3 Fib head - Ankle 33 46 ?44 17.6     Pop fossa EDB 14.2  4.5  103 Pop fossa - Fib head 10 49 ?44 18.4         Pop fossa - Ankle      R Tibial - AH     Ankle AH 3.5 ?5.8 13.6 ?4.0 100 Ankle - AH 9   34.3     Pop fossa AH 12.7  11.0  80.8 Pop fossa - Ankle 38 42 ?41 31.1  L Tibial - AH     Ankle AH 4.9 ?5.8 14.6 ?4.0 100 Ankle - AH 9   27.6     Pop fossa AH 14.0  9.0  61.5 Pop fossa - Ankle 38 42 ?41 22.9             SNC    Nerve / Sites Rec. Site Peak Lat Ref.  Amp Ref. Segments Distance    ms ms V V  cm  R Sural - Ankle (Calf)     Calf Ankle 3.6 ?4.4 14 ?6 Calf - Ankle 14  L Sural - Ankle (Calf)     Calf Ankle 3.6 ?4.4 11 ?6 Calf - Ankle 14  R Superficial peroneal - Ankle     Lat leg Ankle 4.0 ?4.4 8 ?6 Lat leg - Ankle 14  L Superficial peroneal - Ankle   Lat leg Ankle 4.0 ?4.4 8 ?6 Lat leg - Ankle 14             F  Wave    Nerve F Lat Ref.   ms ms  R Tibial - AH 52.5 ?56.0  L Tibial - AH 53.4 ?56.0         H Reflex    Nerve H Lat Lat Hmax   ms ms   Left Right Ref. Left Right Ref.  Tibial - [place] 30.5 30.6 ?35.0 30.5 31.7 ?  35.0         EMG full       EMG Summary Table    Spontaneous MUAP Recruitment  Muscle IA Fib PSW Fasc Other Amp Dur. Poly Pattern  L. Abductor digiti minimi (manus) Normal None None None _______ Normal Normal Normal Normal

## 2018-08-18 NOTE — Progress Notes (Signed)
Please refer to EMG and nerve conduction procedure note.  

## 2018-08-20 ENCOUNTER — Encounter (HOSPITAL_COMMUNITY): Payer: Self-pay | Admitting: Occupational Therapy

## 2018-08-20 ENCOUNTER — Ambulatory Visit (HOSPITAL_COMMUNITY): Payer: BLUE CROSS/BLUE SHIELD | Admitting: Physical Therapy

## 2018-08-20 ENCOUNTER — Ambulatory Visit (HOSPITAL_COMMUNITY): Payer: BLUE CROSS/BLUE SHIELD | Admitting: Occupational Therapy

## 2018-08-20 DIAGNOSIS — R262 Difficulty in walking, not elsewhere classified: Secondary | ICD-10-CM | POA: Diagnosis not present

## 2018-08-20 DIAGNOSIS — G8929 Other chronic pain: Secondary | ICD-10-CM | POA: Diagnosis not present

## 2018-08-20 DIAGNOSIS — M6281 Muscle weakness (generalized): Secondary | ICD-10-CM

## 2018-08-20 DIAGNOSIS — M5442 Lumbago with sciatica, left side: Principal | ICD-10-CM

## 2018-08-20 DIAGNOSIS — M79622 Pain in left upper arm: Secondary | ICD-10-CM

## 2018-08-20 DIAGNOSIS — R29898 Other symptoms and signs involving the musculoskeletal system: Secondary | ICD-10-CM

## 2018-08-20 NOTE — Patient Instructions (Signed)
Piriformis Stretch, Sitting    Sit, one ankle on opposite knee, same-side hand on crossed knee. Push down on knee, keeping spine straight. Lean torso forward, with flat back, until tension is felt in hamstrings and gluteals of crossed-leg side. Hold _30__ seconds. Repeat _3__ times per session. Do _2__ sessions per day.  Copyright  VHI. All rights reserved.   

## 2018-08-20 NOTE — Therapy (Signed)
[place], [place], [address] Phone: [phone]   Fax:  [phone]  Occupational Therapy Treatment  Patient Details  Name: Sarah Cooley MRN: [id number] Date of Birth: [birth year] Referring Provider (OT): Marchia Bond, MD    Encounter Date: 08/20/2018  OT End of Session - 08/20/18 1836    Visit Number  2    Number of Visits  8    Date for OT Re-Evaluation  09/11/18    Authorization Type  1) [employer] Calendar year plan (PT/OT/chiro 12 out of 30 used with 18 remaining)     Authorization - Visit Number  2    Authorization - Number of Visits  8    OT Start Time  4765    OT Stop Time  1600    OT Time Calculation (min)  44 min    Activity Tolerance  Patient tolerated treatment well    Behavior During Therapy  [facility] for tasks assessed/performed       Past Medical History:  Diagnosis Date  . Allergy    seasonal  . Anxiety   . Arthritis   . Balance problem   . Brain aneurysm    2 mm ACA region aneurysm by 09/21/15 MRA  . Breast cancer ([place])   . Breast cancer of lower-inner quadrant of right female breast ([place]) 08/26/2015  . Chronic kidney disease    acute renal failure one occasion  . Complication of anesthesia    heart rate drops and O2 sats drop  . Constipation   . Depression   . Diplopia 10/29/2017  . Dizziness 10/29/2017  . Elevated liver function tests   . Family history of adverse reaction to anesthesia    mom has n/v  . Family history of breast cancer   . Family history of colon cancer   . Fibromyalgia   . Fibromyalgia   . Gait abnormality 10/29/2017  . Genetic testing 09/08/2015   Negative genetic testing on the Breast/High Moderate Risk panel. The Breast High/Moderate Risk gene panel offered by [facility] includes sequencing and deletion/duplication analysis of the following 9 genes: ATM, BRCA1, BRCA2, CDH1, CHEK2, PALB2, PTEN, STK11, and TP53. The report date is September 08, 2015.  The Rest of the  Comprehensive Cancer panel has been reflexed and will be available in 2-3 weeks.  POLD1 c.208G>T VUS found on the Remainder of the Comprehensive cancer panel.  The Comprehensive Cancer Panel offered by [facility] includes sequencing and/or deletion duplication testing of the following 32 genes: APC, ATM, AXIN2, BARD1, BMPR1A, BRCA1, BRCA2, BRIP1, CDH1, CDK4, CDKN2A, CHEK2, EPCAM, FANCC, MLH1, MSH2, MSH6, MUTYH, NBN, PALB2, PMS2, POLD1, POLE, PTEN, RAD51C, RAD51D, SCG5/GREM1, SMAD4, STK11, TP53, VHL, and XRCC2.   The report date is 09/12/2015.   Marland Kitchen GERD (gastroesophageal reflux disease)    none recent  . H/O acute renal failure 09/22/15   admitted to [facility]  . Headache    occipital and temporal  . History of hiatal hernia   . Hyperlipemia   . Hyperlipidemia   . Hypertension   . Irritable bowel syndrome (IBS)   . MGUS (monoclonal gammopathy of unknown significance) 03/15/2016  . Neuromuscular disorder (HCC)    fibromyalgia  . Occasional tremors   . OSA (obstructive sleep apnea)    uses cpap with humidification- auto   . Personal history of radiation therapy   . PONV (postoperative nausea and vomiting)   . Renal cell carcinoma ([place])   . Tremor, essential 10/29/2017  Past Surgical History:  Procedure Laterality Date  . ABDOMINAL HYSTERECTOMY     adenomyosis  . ABDOMINAL HYSTERECTOMY    . BLADDER SURGERY    . BONE GRAFT HIP ILIAC CREST    . BREAST BIOPSY Bilateral   . BREAST LUMPECTOMY    . BREAST SURGERY    . CHOLECYSTECTOMY    . FRACTURE SURGERY Right    wrist  . KNEE ARTHROSCOPY    . KNEE ARTHROSCOPY W/ ACL RECONSTRUCTION     right  . RADIOACTIVE SEED GUIDED PARTIAL MASTECTOMY WITH AXILLARY SENTINEL LYMPH NODE BIOPSY Right 10/13/2015   Procedure: RADIOACTIVE SEED GUIDED PARTIAL MASTECTOMY WITH AXILLARY SENTINEL LYMPH NODE BIOPSY;  Surgeon: Erroll Luna, MD;  Location: [facility];  Service: General;  Laterality: Right;  . right carpal and cubital tunnel release     . ROBOTIC  ASSITED PARTIAL NEPHRECTOMY Left 06/19/2018   Procedure: XI ROBOTIC ASSITED PARTIAL NEPHRECTOMY;  Surgeon: Ardis Hughs, MD;  Location[facility];  Service: Urology;  Laterality: Left;  . WRIST SURGERY      There were no vitals filed for this visit.  Subjective Assessment - 08/20/18 1834    Subjective   S: My shoulder isn't hurting as much as it was.     Currently in Pain?  Yes    Pain Score  5     Pain Location  Shoulder    Pain Orientation  Left    Pain Descriptors / Indicators  Aching    Pain Type  Acute pain    Pain Radiating Towards  N/A    Pain Onset  More than a month ago    Pain Frequency  Intermittent    Aggravating Factors   Reaching and lifting    Pain Relieving Factors  Medication, rest    Effect of Pain on Daily Activities  Mod effect on daily activities.     Multiple Pain Sites  No         [facility] OT Assessment - 08/20/18 1515      Assessment   Medical Diagnosis  Left arm pain       Precautions   Precautions  Fall               OT Treatments/Exercises (OP) - 08/20/18 1515      Exercises   Exercises  Shoulder      Shoulder Exercises: Supine   Protraction  PROM;5 reps;AAROM;10 reps    Horizontal ABduction  PROM;5 reps;AAROM;10 reps    External Rotation  PROM;5 reps;AAROM;10 reps    Internal Rotation  PROM;5 reps;AAROM;10 reps    Flexion  PROM;5 reps;AAROM;10 reps    ABduction  PROM;5 reps;AAROM;10 reps      Shoulder Exercises: Standing   Protraction  AAROM;12 reps    Horizontal ABduction  AAROM;12 reps    External Rotation  AAROM;12 reps    Internal Rotation  AAROM;12 reps    Flexion  AAROM;12 reps    ABduction  AAROM;12 reps    Extension  AROM;15 reps    Row  AROM;15 reps      Shoulder Exercises: Pulleys   Flexion  1 minute    ABduction  1 minute      Shoulder Exercises: ROM/Strengthening   Wall Wash  1', min fatigue     Other ROM/Strengthening Exercises  Small wall circles at 90 degrees using towel on door, mod difficulty         Manual Therapy   Manual Therapy  Myofascial release  Manual therapy comments   Manual therapy completed prior to therapy exercises     Myofascial Release  Myofascial release to left upper arm, deltoid, and trapezius regions to decrease pain and fascial restrictions and increase joint range of motion                OT Short Term Goals - 08/20/18 1837      OT SHORT TERM GOAL #1   Title  Pt will be educated on and independent in HEP for improving functional use of LUE during ADLs.      Time  4    Period  Weeks    Status  On-going      OT SHORT TERM GOAL #2   Title  Pt will improve LUE A/ROM to WNL to improve ability to perform dressing tasks.      Time  4    Period  Weeks    Status  On-going      OT SHORT TERM GOAL #3   Title  Pt will decrease pain to 3/10 or less to improve ability to perform functional reaching activities while showering.      Time  4    Period  Weeks    Status  On-going      OT SHORT TERM GOAL #4   Title   Pt will improve strength to 5/5 to improve ability to complete household chores.      Time  4    Period  Weeks    Status  On-going      OT SHORT TERM GOAL #5   Title  Pt will decrease fascial restriction to trace amounts in order to be able to reach her high kitchen cabinets while cooking.     Time  4    Period  Weeks    Status  On-going               Plan - 08/20/18 1838    Clinical Impression Statement  A: Worked with pt on A/AROM this session. Completed wall washes with min fatigue and wall circles at 90 degrees using a towel with mod difficulty noted. Pt reported pain at end of ROM during exercises. VC for form and technique.      Plan  P: Progress as tolerated. Attempt A/ROM in supine next session. Update HEP as needed.        Patient will benefit from skilled therapeutic intervention in order to improve the following deficits and impairments:  Decreased activity tolerance, Decreased strength, Impaired flexibility, Decreased  range of motion, Pain, Increased fascial restrictions, Impaired UE functional use  Visit Diagnosis: Pain in left upper arm  Other symptoms and signs involving the musculoskeletal system    Problem List Patient Active Problem List   Diagnosis Date Noted  . Exertional chest pain 08/01/2018  . Family history of melanoma 07/31/2018  . Renal cancer, left ([place]) 07/31/2018  . Renal mass, left 06/19/2018  . Clostridium difficile diarrhea   . Weakness   . Hypokalemia, gastrointestinal losses 05/25/2018  . Hypokalemia 05/24/2018  . Dizziness 10/29/2017  . Gait abnormality 10/29/2017  . Tremor, essential 10/29/2017  . Diplopia 10/29/2017  . Obstructive sleep apnea on CPAP 12/10/2016  . Plantar fasciitis, bilateral 12/10/2016  . Fibromyalgia 11/09/2016  . IBS (irritable bowel syndrome) 11/09/2016  . GERD (gastroesophageal reflux disease) 11/09/2016  . Anxiety and depression 11/09/2016  . MGUS (monoclonal gammopathy of unknown significance) 03/15/2016  . Family history of breast cancer   . Family  history of colon cancer   . Breast cancer of lower-inner quadrant of right female breast ([place]) 08/26/2015  . Headache 07/17/2015  . Hypertension 07/17/2015  . Obesity (BMI 35.0-39.9 without comorbidity) 07/17/2015    Toney Rakes, OT student  08/20/2018, 6:44 PM  [facility] [address] [place], [place], [address] Phone: [phone]   Fax:  [phone]  Name: Sarah Cooley MRN: [id number] Date of Birth: 01/22/1966

## 2018-08-20 NOTE — Therapy (Signed)
[place], [place], [address] Phone: [phone]   Fax:  [phone]  Physical Therapy Treatment  Patient Details  Name: Sarah Cooley MRN: [id number] Date of Birth: 1966-07-05 Referring Provider (PT): Kathrynn Ducking, MD   Encounter Date: 08/20/2018  PT End of Session - 08/20/18 1656    Visit Number  2    Number of Visits  13    Date for PT Re-Evaluation  09/25/18    Authorization Type  BCBS (30 visit limit PT/OT/[place] combined, 12 used at eval and pt in OT; no auth required)    Authorization Time Period  08/14/18 - 09/26/18    Authorization - Visit Number  2    Authorization - Number of Visits  9    PT Start Time  2423    PT Stop Time  1653    PT Time Calculation (min)  50 min    Activity Tolerance  Patient tolerated treatment well    Behavior During Therapy  [facility] for tasks assessed/performed       Past Medical History:  Diagnosis Date  . Allergy    seasonal  . Anxiety   . Arthritis   . Balance problem   . Brain aneurysm    2 mm ACA region aneurysm by 09/21/15 MRA  . Breast cancer ([place])   . Breast cancer of lower-inner quadrant of right female breast ([place]) 08/26/2015  . Chronic kidney disease    acute renal failure one occasion  . Complication of anesthesia    heart rate drops and O2 sats drop  . Constipation   . Depression   . Diplopia 10/29/2017  . Dizziness 10/29/2017  . Elevated liver function tests   . Family history of adverse reaction to anesthesia    mom has n/v  . Family history of breast cancer   . Family history of colon cancer   . Fibromyalgia   . Fibromyalgia   . Gait abnormality 10/29/2017  . Genetic testing 09/08/2015   Negative genetic testing on the Breast/High Moderate Risk panel. The Breast High/Moderate Risk gene panel offered by [facility] includes sequencing and deletion/duplication analysis of the following 9 genes: ATM, BRCA1, BRCA2, CDH1, CHEK2, PALB2, PTEN, STK11, and TP53.  The report date is September 08, 2015.  The Rest of the Comprehensive Cancer panel has been reflexed and will be available in 2-3 weeks.  POLD1 c.208G>T VUS found on the Remainder of the Comprehensive cancer panel.  The Comprehensive Cancer Panel offered by [facility] includes sequencing and/or deletion duplication testing of the following 32 genes: APC, ATM, AXIN2, BARD1, BMPR1A, BRCA1, BRCA2, BRIP1, CDH1, CDK4, CDKN2A, CHEK2, EPCAM, FANCC, MLH1, MSH2, MSH6, MUTYH, NBN, PALB2, PMS2, POLD1, POLE, PTEN, RAD51C, RAD51D, SCG5/GREM1, SMAD4, STK11, TP53, VHL, and XRCC2.   The report date is 09/12/2015.   Marland Kitchen GERD (gastroesophageal reflux disease)    none recent  . H/O acute renal failure 09/22/15   admitted to [facility]  . Headache    occipital and temporal  . History of hiatal hernia   . Hyperlipemia   . Hyperlipidemia   . Hypertension   . Irritable bowel syndrome (IBS)   . MGUS (monoclonal gammopathy of unknown significance) 03/15/2016  . Neuromuscular disorder (HCC)    fibromyalgia  . Occasional tremors   . OSA (obstructive sleep apnea)    uses cpap with humidification- auto   . Personal history of radiation therapy   . PONV (postoperative nausea and vomiting)   .  Renal cell carcinoma ([place])   . Tremor, essential 10/29/2017    Past Surgical History:  Procedure Laterality Date  . ABDOMINAL HYSTERECTOMY     adenomyosis  . ABDOMINAL HYSTERECTOMY    . BLADDER SURGERY    . BONE GRAFT HIP ILIAC CREST    . BREAST BIOPSY Bilateral   . BREAST LUMPECTOMY    . BREAST SURGERY    . CHOLECYSTECTOMY    . FRACTURE SURGERY Right    wrist  . KNEE ARTHROSCOPY    . KNEE ARTHROSCOPY W/ ACL RECONSTRUCTION     right  . RADIOACTIVE SEED GUIDED PARTIAL MASTECTOMY WITH AXILLARY SENTINEL LYMPH NODE BIOPSY Right 10/13/2015   Procedure: RADIOACTIVE SEED GUIDED PARTIAL MASTECTOMY WITH AXILLARY SENTINEL LYMPH NODE BIOPSY;  Surgeon: Erroll Luna, MD;  Location: [facility];  Service: General;  Laterality: Right;  .  right carpal and cubital tunnel release     . ROBOTIC ASSITED PARTIAL NEPHRECTOMY Left 06/19/2018   Procedure: XI ROBOTIC ASSITED PARTIAL NEPHRECTOMY;  Surgeon: Ardis Hughs, MD;  [facility];  Service: Urology;  Laterality: Left;  . WRIST SURGERY      There were no vitals filed for this visit.  Subjective Assessment - 08/20/18 1616    Subjective  Pt states she is doing okay today, 4/10 pain in lumbar today.  Nerve conduction/EMG's for lumbar were normal.  Just finished OT session.      Currently in Pain?  Yes    Pain Score  4     Pain Location  Back    Pain Orientation  Mid    Pain Descriptors / Indicators  Aching;Sore    Pain Radiating Towards  begins in center and radiates bilaterally, LT>Rt down posterior and lateral LE's to knees/.                [facility] Adult PT Treatment/Exercise - 08/20/18 1653      Ambulation/Gait   Ambulation/Gait  Yes    Ambulation/Gait Assistance  7: Independent    Ambulation Distance (Feet)  352 Feet    Assistive device  None    Gait velocity  2MWT / 0.89 m/sec      Lumbar Exercises: Stretches   Piriformis Stretch  Right;Left;2 reps;30 seconds;Limitations    Piriformis Stretch Limitations  seated      Lumbar Exercises: Standing   Heel Raises  10 reps    Heel Raises Limitations  toeraises 10 reps    Forward Lunge  10 reps    Forward Lunge Limitations  4" step no UEs    Other Standing Lumbar Exercises  hamstring curls 10 reps each    Other Standing Lumbar Exercises  hip extension, hip abduction 10 reps each      Lumbar Exercises: Supine   Bridge  10 reps    Bridge Limitations  shallow lifts, more emphasis on glute contraction      Lumbar Exercises: Prone   Other Prone Lumbar Exercises  prone lying 3 minutes    Other Prone Lumbar Exercises  press ups 5 repsX 2 sets      Manual Therapy   Manual Therapy  Soft tissue mobilization    Manual therapy comments   Manual therapy completed at end of session    Soft tissue  mobilization  to bilteral lumbar/glutes in prone lying             PT Education - 08/20/18 1654    Education Details  reveiwed goals and HEP.    Person(s)  Educated  Patient    Methods  Explanation    Comprehension  Verbalized understanding       PT Short Term Goals - 08/20/18 1654      PT SHORT TERM GOAL #1   Title  Patient will be independent with HEP, updated PRN, to improve LE strength to improve posture and reduce LBP with mobility.    Time  2    Period  Weeks    Status  On-going        PT Long Term Goals - 08/20/18 1655      PT LONG TERM GOAL #1   Title  Patient will improve MMT to 5/5 thorughout bil LE to demonstrate significant improvement in functional strength to improve activity tolerance and endurance.    Time  4    Period  Weeks    Status  On-going      PT LONG TERM GOAL #2   Title  Patient will improve lumbar ROM to WNL's for all planes and be pain free throughout to demosntrate improved mobility and position tolerance.    Time  4    Period  Weeks    Status  On-going      PT LONG TERM GOAL #3   Title  Patient will deny symptoms radiating beyond knees to demonstrate centralizaiton of radicular symptoms in bil LE's to indicate reduce sciatic nerve comrpession and reduce patient's pain.     Time  4    Period  Weeks    Status  On-going      PT LONG TERM GOAL #4   Title  Patient will reports being able to sleep through the night for 1 week straight without and increase in pain to improve QOL and overal health/wellness.    Time  4    Period  Weeks    Status  On-going            Plan - 08/20/18 1630    Clinical Impression Statement  reviewed goals and HEP given at evaluation.   Added LE strengthening and stabilization exericses.  BP taken at rest prior to 2MWT and following at 138/96 mmHg and 146/96 mmHg respecitively.  HR also 64 bpm before and 79 bpm following.  Able to add standing stab and strengthening exercises to POC as well as supine  bridging without pain or issues voiced.  STM completed at end of session with no spasms palpated, however with general tightness in lower lumbar and glutes bilaterally.      Rehab Potential  Good    PT Frequency  2x / week    PT Duration  6 weeks    PT Treatment/Interventions  ADLs/Self Care Home Management;Aquatic Therapy;Cryotherapy;Electrical Stimulation;Moist Heat;Traction;DME Instruction;Gait training;Stair training;Functional mobility training;Therapeutic activities;Therapeutic exercise;Balance training;Neuromuscular re-education;Patient/family education;Manual techniques;Passive range of motion;Taping;Spinal Manipulations;Joint Manipulations    PT Next Visit Plan  Continue to monitor BP and HR throughout. Initaite PA's to lumbar spine if with PT and perform extension based exercises initially. Initiate hip strengthening and core stabilization and perform manual soft tissue work PRN along Lt sciatic nerve tract.    PT Home Exercise Plan  Eval: prone on elbows    Consulted and Agree with Plan of Care  Patient       Patient will benefit from skilled therapeutic intervention in order to improve the following deficits and impairments:  Decreased endurance, Decreased range of motion, Decreased strength, Impaired sensation, Pain, Postural dysfunction, Difficulty walking, Cardiopulmonary status limiting activity, Decreased balance, Decreased mobility, Decreased activity  tolerance, Hypomobility, Obesity  Visit Diagnosis: Chronic bilateral low back pain with left-sided sciatica  Muscle weakness (generalized)  Difficulty in walking, not elsewhere classified     Problem List Patient Active Problem List   Diagnosis Date Noted  . Exertional chest pain 08/01/2018  . Family history of melanoma 07/31/2018  . Renal cancer, left ([place]) 07/31/2018  . Renal mass, left 06/19/2018  . Clostridium difficile diarrhea   . Weakness   . Hypokalemia, gastrointestinal losses 05/25/2018  . Hypokalemia  05/24/2018  . Dizziness 10/29/2017  . Gait abnormality 10/29/2017  . Tremor, essential 10/29/2017  . Diplopia 10/29/2017  . Obstructive sleep apnea on CPAP 12/10/2016  . Plantar fasciitis, bilateral 12/10/2016  . Fibromyalgia 11/09/2016  . IBS (irritable bowel syndrome) 11/09/2016  . GERD (gastroesophageal reflux disease) 11/09/2016  . Anxiety and depression 11/09/2016  . MGUS (monoclonal gammopathy of unknown significance) 03/15/2016  . Family history of breast cancer   . Family history of colon cancer   . Breast cancer of lower-inner quadrant of right female breast ([place]) 08/26/2015  . Headache 07/17/2015  . Hypertension 07/17/2015  . Obesity (BMI 35.0-39.9 without comorbidity) 07/17/2015   Teena Irani, PTA/CLT [phone]  Teena Irani 08/20/2018, 5:41 PM  [facility] [address] [place], [place], [address] Phone: [phone]   Fax:  [phone]  Name: [place] DOST MRN: [id number] Date of Birth: 10/18/1966

## 2018-08-21 ENCOUNTER — Ambulatory Visit (HOSPITAL_COMMUNITY): Payer: BLUE CROSS/BLUE SHIELD

## 2018-08-21 ENCOUNTER — Other Ambulatory Visit: Payer: Self-pay

## 2018-08-21 ENCOUNTER — Encounter (HOSPITAL_COMMUNITY): Payer: Self-pay

## 2018-08-21 DIAGNOSIS — R262 Difficulty in walking, not elsewhere classified: Secondary | ICD-10-CM | POA: Diagnosis not present

## 2018-08-21 DIAGNOSIS — M6281 Muscle weakness (generalized): Secondary | ICD-10-CM | POA: Diagnosis not present

## 2018-08-21 DIAGNOSIS — G8929 Other chronic pain: Secondary | ICD-10-CM | POA: Diagnosis not present

## 2018-08-21 DIAGNOSIS — R29898 Other symptoms and signs involving the musculoskeletal system: Secondary | ICD-10-CM | POA: Diagnosis not present

## 2018-08-21 DIAGNOSIS — M79622 Pain in left upper arm: Secondary | ICD-10-CM | POA: Diagnosis not present

## 2018-08-21 DIAGNOSIS — M5442 Lumbago with sciatica, left side: Principal | ICD-10-CM

## 2018-08-21 NOTE — Therapy (Signed)
[place], [place], [address] Phone: [phone]   Fax:  [phone]  Physical Therapy Treatment  Patient Details  Name: Sarah Cooley MRN: [id number] Date of Birth: November 15, 1965 Referring Provider (PT): Kathrynn Ducking, MD   Encounter Date: 08/21/2018  PT End of Session - 08/21/18 0912    Visit Number  3    Number of Visits  13    Date for PT Re-Evaluation  09/25/18    Authorization Type  [employer] (30 visit limit PT/OT/Chiro combined, 12 used at eval and pt in OT; no auth required)    Authorization Time Period  08/14/18 - 09/26/18    Authorization - Visit Number  3    Authorization - Number of Visits  9    PT Start Time  0900    PT Stop Time  0941    PT Time Calculation (min)  41 min    Activity Tolerance  Patient tolerated treatment well    Behavior During Therapy  [facility] for tasks assessed/performed       Past Medical History:  Diagnosis Date  . Allergy    seasonal  . Anxiety   . Arthritis   . Balance problem   . Brain aneurysm    2 mm ACA region aneurysm by 09/21/15 MRA  . Breast cancer ([place])   . Breast cancer of lower-inner quadrant of right female breast ([place]) 08/26/2015  . Chronic kidney disease    acute renal failure one occasion  . Complication of anesthesia    heart rate drops and O2 sats drop  . Constipation   . Depression   . Diplopia 10/29/2017  . Dizziness 10/29/2017  . Elevated liver function tests   . Family history of adverse reaction to anesthesia    mom has n/v  . Family history of breast cancer   . Family history of colon cancer   . Fibromyalgia   . Fibromyalgia   . Gait abnormality 10/29/2017  . Genetic testing 09/08/2015   Negative genetic testing on the Breast/High Moderate Risk panel. The Breast High/Moderate Risk gene panel offered by [facility] includes sequencing and deletion/duplication analysis of the following 9 genes: ATM, BRCA1, BRCA2, CDH1, CHEK2, PALB2, PTEN, STK11, and TP53.  The report date is September 08, 2015.  The Rest of the Comprehensive Cancer panel has been reflexed and will be available in 2-3 weeks.  POLD1 c.208G>T VUS found on the Remainder of the Comprehensive cancer panel.  The Comprehensive Cancer Panel offered by [facility] includes sequencing and/or deletion duplication testing of the following 32 genes: APC, ATM, AXIN2, BARD1, BMPR1A, BRCA1, BRCA2, BRIP1, CDH1, CDK4, CDKN2A, CHEK2, EPCAM, FANCC, MLH1, MSH2, MSH6, MUTYH, NBN, PALB2, PMS2, POLD1, POLE, PTEN, RAD51C, RAD51D, SCG5/GREM1, SMAD4, STK11, TP53, VHL, and XRCC2.   The report date is 09/12/2015.   Marland Kitchen GERD (gastroesophageal reflux disease)    none recent  . H/O acute renal failure 09/22/15   admitted to [facility]  . Headache    occipital and temporal  . History of hiatal hernia   . Hyperlipemia   . Hyperlipidemia   . Hypertension   . Irritable bowel syndrome (IBS)   . MGUS (monoclonal gammopathy of unknown significance) 03/15/2016  . Neuromuscular disorder (HCC)    fibromyalgia  . Occasional tremors   . OSA (obstructive sleep apnea)    uses cpap with humidification- auto   . Personal history of radiation therapy   . PONV (postoperative nausea and vomiting)   .  Renal cell carcinoma ([place])   . Tremor, essential 10/29/2017    Past Surgical History:  Procedure Laterality Date  . ABDOMINAL HYSTERECTOMY     adenomyosis  . ABDOMINAL HYSTERECTOMY    . BLADDER SURGERY    . BONE GRAFT HIP ILIAC CREST    . BREAST BIOPSY Bilateral   . BREAST LUMPECTOMY    . BREAST SURGERY    . CHOLECYSTECTOMY    . FRACTURE SURGERY Right    wrist  . KNEE ARTHROSCOPY    . KNEE ARTHROSCOPY W/ ACL RECONSTRUCTION     right  . RADIOACTIVE SEED GUIDED PARTIAL MASTECTOMY WITH AXILLARY SENTINEL LYMPH NODE BIOPSY Right 10/13/2015   Procedure: RADIOACTIVE SEED GUIDED PARTIAL MASTECTOMY WITH AXILLARY SENTINEL LYMPH NODE BIOPSY;  Surgeon: Erroll Luna, MD;  Location: [facility];  Service: General;  Laterality: Right;  .  right carpal and cubital tunnel release     . ROBOTIC ASSITED PARTIAL NEPHRECTOMY Left 06/19/2018   Procedure: XI ROBOTIC ASSITED PARTIAL NEPHRECTOMY;  Surgeon: Ardis Hughs, MD;  Location: [facility];  Service: Urology;  Laterality: Left;  . WRIST SURGERY      There were no vitals filed for this visit.   Subjective Assessment - 08/21/18 0903    Subjective  Patient reports last night she was really sore last night after therapy, about 8/10 pain. She reports it is better today and about 5/10 right now.     Pertinent History  06/19/18 Lt partial nephrectomy, breast cancer 2016, pulmonary HTN, fibromyalgia, sleep apnea    Limitations  Walking;Other (comment);Standing;Sitting;House hold activities    Currently in Pain?  Yes    Pain Score  5     Pain Location  Back    Pain Orientation  Lower    Pain Descriptors / Indicators  Aching    Pain Type  Chronic pain    Pain Onset  More than a month ago    Pain Frequency  Intermittent        [facility] Adult PT Treatment/Exercise - 08/21/18 0001      Exercises   Exercises  Lumbar      Lumbar Exercises: Stretches   Lower Trunk Rotation  10 seconds;Limitations    Lower Trunk Rotation Limitations  10 reps bilaterally    Prone on Elbows Stretch  10 seconds;Limitations    Prone on Elbows Stretch Limitations  10 reps    Piriformis Stretch  Right;Left;2 reps;30 seconds;Limitations    Piriformis Stretch Limitations  seated      Lumbar Exercises: Standing   Heel Raises  15 reps;3 seconds   on incline   Heel Raises Limitations  1x 15 reps toes raises, on decline    Forward Lunge  10 reps;Limitations    Forward Lunge Limitations  Bil LE's, 4" step, no UE assist    Other Standing Lumbar Exercises  1x 10 reps bil LE, hip extension/hip abduction with red TB at ankles      Lumbar Exercises: Supine   Bridge  10 reps   2 sets   Bridge Limitations  cues to flex knees to encourage higher elevation      Lumbar Exercises: Prone   Straight Leg Raise  10  reps;2 seconds    Straight Leg Raises Limitations  bil LE's      Manual Therapy   Manual Therapy  Joint mobilization    Manual therapy comments   Manual therapy completed seperate from other interventions    Joint Mobilization  Grade II mobilization, PA's  to L1-5, 3x 30-45 seconds        PT Education - 08/21/18 0912    Education Details  Educated on exercise throughout session and update HEP.    Person(s) Educated  Patient    Methods  Explanation;Handout    Comprehension  Verbalized understanding       PT Short Term Goals - 08/20/18 1654      PT SHORT TERM GOAL #1   Title  Patient will be independent with HEP, updated PRN, to improve LE strength to improve posture and reduce LBP with mobility.    Time  2    Period  Weeks    Status  On-going        PT Long Term Goals - 08/20/18 1655      PT LONG TERM GOAL #1   Title  Patient will improve MMT to 5/5 thorughout bil LE to demonstrate significant improvement in functional strength to improve activity tolerance and endurance.    Time  4    Period  Weeks    Status  On-going      PT LONG TERM GOAL #2   Title  Patient will improve lumbar ROM to WNL's for all planes and be pain free throughout to demosntrate improved mobility and position tolerance.    Time  4    Period  Weeks    Status  On-going      PT LONG TERM GOAL #3   Title  Patient will deny symptoms radiating beyond knees to demonstrate centralizaiton of radicular symptoms in bil LE's to indicate reduce sciatic nerve comrpession and reduce patient's pain.     Time  4    Period  Weeks    Status  On-going      PT LONG TERM GOAL #4   Title  Patient will reports being able to sleep through the night for 1 week straight without and increase in pain to improve QOL and overal health/wellness.    Time  4    Period  Weeks    Status  On-going        Plan - 08/21/18 0936    Clinical Impression Statement  Continued with focus on hip strengthening and lumbar mobility  exercises. Initiated PA's to lumbar spine and patient performed prone on elbows and prone SLR to further facilitate lumbar mobility into extension. She also was instructed on low trunk rotation to reduce stiffness of lumbar spine in mornings and this was provided for HEP. Patient continued with standing LE strengthening as well and piriformis stretch was reviewed with patient demonstrating good form and hip mobility. She will continue to benefit from skilled PT interventions to address impairments and progress towards goals for improve activity tolerance and decreased back pain.    Rehab Potential  Good    PT Frequency  2x / week    PT Duration  6 weeks    PT Treatment/Interventions  ADLs/Self Care Home Management;Aquatic Therapy;Cryotherapy;Electrical Stimulation;Moist Heat;Traction;DME Instruction;Gait training;Stair training;Functional mobility training;Therapeutic activities;Therapeutic exercise;Balance training;Neuromuscular re-education;Patient/family education;Manual techniques;Passive range of motion;Taping;Spinal Manipulations;Joint Manipulations    PT Next Visit Plan  Continue to monitor BP and HR throughout. Continue PA's to lumbar spine if with PT and perform extension based exercises initially. Initiate hip strengthening and core stabilization and perform manual soft tissue work PRN along Lt sciatic nerve tract. Add squats next session.    PT Home Exercise Plan  Eval: prone on elbows, piriformis stretch, LTR, bridge    Consulted and Agree with Plan of  Care  Patient       Patient will benefit from skilled therapeutic intervention in order to improve the following deficits and impairments:  Decreased endurance, Decreased range of motion, Decreased strength, Impaired sensation, Pain, Postural dysfunction, Difficulty walking, Cardiopulmonary status limiting activity, Decreased balance, Decreased mobility, Decreased activity tolerance, Hypomobility, Obesity  Visit Diagnosis: Chronic bilateral  low back pain with left-sided sciatica  Muscle weakness (generalized)  Difficulty in walking, not elsewhere classified     Problem List Patient Active Problem List   Diagnosis Date Noted  . Exertional chest pain 08/01/2018  . Family history of melanoma 07/31/2018  . Renal cancer, left ([place]) 07/31/2018  . Renal mass, left 06/19/2018  . Clostridium difficile diarrhea   . Weakness   . Hypokalemia, gastrointestinal losses 05/25/2018  . Hypokalemia 05/24/2018  . Dizziness 10/29/2017  . Gait abnormality 10/29/2017  . Tremor, essential 10/29/2017  . Diplopia 10/29/2017  . Obstructive sleep apnea on CPAP 12/10/2016  . Plantar fasciitis, bilateral 12/10/2016  . Fibromyalgia 11/09/2016  . IBS (irritable bowel syndrome) 11/09/2016  . GERD (gastroesophageal reflux disease) 11/09/2016  . Anxiety and depression 11/09/2016  . MGUS (monoclonal gammopathy of unknown significance) 03/15/2016  . Family history of breast cancer   . Family history of colon cancer   . Breast cancer of lower-inner quadrant of right female breast ([place]) 08/26/2015  . Headache 07/17/2015  . Hypertension 07/17/2015  . Obesity (BMI 35.0-39.9 without comorbidity) 07/17/2015    Kipp Brood, PT, DPT Physical Therapist with [facility]  08/21/2018 9:53 AM    [place], [place], [address] Phone: [phone]   Fax:  [phone]  Name: Sarah Cooley MRN: [id number] Date of Birth: [birth year]

## 2018-08-21 NOTE — Patient Instructions (Signed)
Supine Bridge reps: 10 sets: 3 hold: 3 seconds daily: 1  weekly: 7      Exercise image step 1   Exercise image step 2 Setup  Begin lying on your back with your arms resting at your sides, your legs bent at the knees and your feet flat on the ground. Movement  Tighten your abdominals and slowly lift your hips off the floor into a bridge position, keeping your back straight. Tip  Make sure to keep your trunk stiff throughout the exercise and your arms flat on the floor. Image: [employer] logo Login URL: [email or web address] Access Code: [email or web address]  Disclaimer: This program provides exercises related to your condition that you can perform at home. As there is a risk of injury with any activity, use caution when performing exercises. If you experience any pain or discomfort, discontinue the exercises and contact your health care provider. Date printed: 08/21/2018  Supine Lower Trunk Rotation reps: 10 sets: 2 hold: 5-10 seconds daily: 1  weekly: 7      Exercise image step 1   Exercise image step 2 Setup  Begin lying on your back with your knees bent and feet resting on the floor. Movement  Keeping your back flat, slowly rotate your knees down towards the floor until you feel a stretch in your trunk and hold. Tip  Make sure that your back and shoulders stay in contact with the floor.

## 2018-08-25 ENCOUNTER — Telehealth: Payer: Self-pay | Admitting: Genetic Counselor

## 2018-08-25 DIAGNOSIS — M545 Low back pain: Secondary | ICD-10-CM | POA: Diagnosis not present

## 2018-08-25 DIAGNOSIS — E559 Vitamin D deficiency, unspecified: Secondary | ICD-10-CM | POA: Diagnosis not present

## 2018-08-25 DIAGNOSIS — C649 Malignant neoplasm of unspecified kidney, except renal pelvis: Secondary | ICD-10-CM | POA: Diagnosis not present

## 2018-08-25 DIAGNOSIS — M7918 Myalgia, other site: Secondary | ICD-10-CM | POA: Diagnosis not present

## 2018-08-25 DIAGNOSIS — I272 Pulmonary hypertension, unspecified: Secondary | ICD-10-CM | POA: Diagnosis not present

## 2018-08-25 DIAGNOSIS — R1032 Left lower quadrant pain: Secondary | ICD-10-CM | POA: Diagnosis not present

## 2018-08-25 NOTE — Telephone Encounter (Signed)
LM on VM that results were back and to please call back.  Left CB instructions.

## 2018-08-27 ENCOUNTER — Encounter (HOSPITAL_COMMUNITY): Payer: Self-pay

## 2018-08-27 ENCOUNTER — Encounter (HOSPITAL_COMMUNITY): Payer: Self-pay | Admitting: Occupational Therapy

## 2018-08-27 ENCOUNTER — Ambulatory Visit (HOSPITAL_COMMUNITY): Payer: BLUE CROSS/BLUE SHIELD | Admitting: Occupational Therapy

## 2018-08-27 ENCOUNTER — Ambulatory Visit (HOSPITAL_COMMUNITY): Payer: BLUE CROSS/BLUE SHIELD | Attending: Neurology

## 2018-08-27 VITALS — BP 121/85 | HR 88

## 2018-08-27 DIAGNOSIS — M79622 Pain in left upper arm: Secondary | ICD-10-CM | POA: Diagnosis not present

## 2018-08-27 DIAGNOSIS — R29898 Other symptoms and signs involving the musculoskeletal system: Secondary | ICD-10-CM

## 2018-08-27 DIAGNOSIS — R262 Difficulty in walking, not elsewhere classified: Secondary | ICD-10-CM | POA: Insufficient documentation

## 2018-08-27 DIAGNOSIS — M5442 Lumbago with sciatica, left side: Secondary | ICD-10-CM | POA: Diagnosis not present

## 2018-08-27 DIAGNOSIS — G8929 Other chronic pain: Secondary | ICD-10-CM

## 2018-08-27 DIAGNOSIS — M6281 Muscle weakness (generalized): Secondary | ICD-10-CM | POA: Insufficient documentation

## 2018-08-27 NOTE — Patient Instructions (Signed)
Repeat all exercises 10-15 times, 1-2 times per day.  1) Shoulder Protraction    Begin with elbows by your side, slowly "punch" straight out in front of you.      2) Shoulder Flexion  Supine:     Standing:         Begin with arms at your side with thumbs pointed up, slowly raise both arms up and forward towards overhead.               3) Horizontal abduction/adduction  Supine:   Standing:           Begin with arms straight out in front of you, bring out to the side in at "T" shape. Keep arms straight entire time.                 4) Internal & External Rotation    *No band* -Stand with elbows at the side and elbows bent 90 degrees. Move your forearms away from your body, then bring back inward toward the body.     5) Shoulder Abduction  Supine:     Standing:       Lying on your back begin with your arms flat on the table next to your side. Slowly move your arms out to the side so that they go overhead, in a jumping jack or snow angel movement.    6) X to V arms (cheerleader move):  Begin with arms straight down, crossed in front of body in an "X". Keeping arms crossed, lift arms straight up overhead. Then spread arms apart into a "V" shape.  Bring back together into x and lower down to starting position.    7) W arms:  Begin with elbows bent and even with shoulders, hands pointing to ceiling. Keeping elbows at shoulder level, 1-shrug shoulders up, 2-squeeze shoulder blades together, and 3-relax.     

## 2018-08-27 NOTE — Therapy (Signed)
[place], [place], [address] Phone: [phone]   Fax:  [phone]  Physical Therapy Treatment  Patient Details  Name: Sarah Cooley MRN: [id number] Date of Birth: Jul 15, 1966 Referring Provider (PT): Kathrynn Ducking, MD   Encounter Date: 08/27/2018  PT End of Session - 08/27/18 0919    Visit Number  4    Number of Visits  13    Date for PT Re-Evaluation  09/25/18   Minireassess 09/04/18   Authorization Type  [employer] (30 visit limit PT/OT/Chiro combined, 12 used at eval and pt in OT; no auth required)    Authorization Time Period  08/14/18 - 09/26/18    Authorization - Visit Number  4    Authorization - Number of Visits  9    PT Start Time  0903    PT Stop Time  0942    PT Time Calculation (min)  39 min    Activity Tolerance  Patient tolerated treatment well    Behavior During Therapy  [facility] for tasks assessed/performed       Past Medical History:  Diagnosis Date  . Allergy    seasonal  . Anxiety   . Arthritis   . Balance problem   . Brain aneurysm    2 mm ACA region aneurysm by 09/21/15 MRA  . Breast cancer ([place])   . Breast cancer of lower-inner quadrant of right female breast ([place]) 08/26/2015  . Chronic kidney disease    acute renal failure one occasion  . Complication of anesthesia    heart rate drops and O2 sats drop  . Constipation   . Depression   . Diplopia 10/29/2017  . Dizziness 10/29/2017  . Elevated liver function tests   . Family history of adverse reaction to anesthesia    mom has n/v  . Family history of breast cancer   . Family history of colon cancer   . Fibromyalgia   . Fibromyalgia   . Gait abnormality 10/29/2017  . Genetic testing 09/08/2015   Negative genetic testing on the Breast/High Moderate Risk panel. The Breast High/Moderate Risk gene panel offered by [facility] includes sequencing and deletion/duplication analysis of the following 9 genes: ATM, BRCA1, BRCA2, CDH1, CHEK2, PALB2,  PTEN, STK11, and TP53. The report date is September 08, 2015.  The Rest of the Comprehensive Cancer panel has been reflexed and will be available in 2-3 weeks.  POLD1 c.208G>T VUS found on the Remainder of the Comprehensive cancer panel.  The Comprehensive Cancer Panel offered by [facility] includes sequencing and/or deletion duplication testing of the following 32 genes: APC, ATM, AXIN2, BARD1, BMPR1A, BRCA1, BRCA2, BRIP1, CDH1, CDK4, CDKN2A, CHEK2, EPCAM, FANCC, MLH1, MSH2, MSH6, MUTYH, NBN, PALB2, PMS2, POLD1, POLE, PTEN, RAD51C, RAD51D, SCG5/GREM1, SMAD4, STK11, TP53, VHL, and XRCC2.   The report date is 09/12/2015.   Marland Kitchen GERD (gastroesophageal reflux disease)    none recent  . H/O acute renal failure 09/22/15   admitted to [facility]  . Headache    occipital and temporal  . History of hiatal hernia   . Hyperlipemia   . Hyperlipidemia   . Hypertension   . Irritable bowel syndrome (IBS)   . MGUS (monoclonal gammopathy of unknown significance) 03/15/2016  . Neuromuscular disorder (HCC)    fibromyalgia  . Occasional tremors   . OSA (obstructive sleep apnea)    uses cpap with humidification- auto   . Personal history of radiation therapy   . PONV (postoperative nausea  and vomiting)   . Renal cell carcinoma ([place])   . Tremor, essential 10/29/2017    Past Surgical History:  Procedure Laterality Date  . ABDOMINAL HYSTERECTOMY     adenomyosis  . ABDOMINAL HYSTERECTOMY    . BLADDER SURGERY    . BONE GRAFT HIP ILIAC CREST    . BREAST BIOPSY Bilateral   . BREAST LUMPECTOMY    . BREAST SURGERY    . CHOLECYSTECTOMY    . FRACTURE SURGERY Right    wrist  . KNEE ARTHROSCOPY    . KNEE ARTHROSCOPY W/ ACL RECONSTRUCTION     right  . RADIOACTIVE SEED GUIDED PARTIAL MASTECTOMY WITH AXILLARY SENTINEL LYMPH NODE BIOPSY Right 10/13/2015   Procedure: RADIOACTIVE SEED GUIDED PARTIAL MASTECTOMY WITH AXILLARY SENTINEL LYMPH NODE BIOPSY;  Surgeon: Erroll Luna, MD;  Location: [place];  Service: General;   Laterality: Right;  . right carpal and cubital tunnel release     . ROBOTIC ASSITED PARTIAL NEPHRECTOMY Left 06/19/2018   Procedure: XI ROBOTIC ASSITED PARTIAL NEPHRECTOMY;  Surgeon: Ardis Hughs, MD;  [facility];  Service: Urology;  Laterality: Left;  . WRIST SURGERY      Vitals:   08/27/18 0905  BP: 121/85  Pulse: 88    Subjective Assessment - 08/27/18 0905    Subjective  Pt reports increased pain following last session, could hardly move Friday.  Current pain scale 5/10 LBP no radicular symptoms.  Reports new BP medication, began yesterday.    Pertinent History  06/19/18 Lt partial nephrectomy, breast cancer 2016, pulmonary HTN, fibromyalgia, sleep apnea    Patient Stated Goals  to have less pain and be able to sleep through the night    Currently in Pain?  Yes    Pain Score  5     Pain Location  Back    Pain Orientation  Lower    Pain Descriptors / Indicators  Dull;Sharp   Constant sharp pain when laying on side with numbness in LE   Pain Type  Chronic pain    Pain Onset  More than a month ago    Pain Frequency  Intermittent    Aggravating Factors   Reaching and lifting    Pain Relieving Factors  Medication, rest    Effect of Pain on Daily Activities  Mod effect on daily activities                       [facility] Adult PT Treatment/Exercise - 08/27/18 0001      Exercises   Exercises  Lumbar      Lumbar Exercises: Stretches   Single Knee to Chest Stretch  2 reps;30 seconds    Lower Trunk Rotation  10 seconds;Limitations    Lower Trunk Rotation Limitations  10 reps bilaterally    Prone on Elbows Stretch  10 seconds;Limitations    Prone on Elbows Stretch Limitations  10 reps    Figure 4 Stretch  2 reps;30 seconds;Without overpressure    Figure 4 Stretch Limitations  towel assistance      Lumbar Exercises: Standing   Heel Raises  15 reps;3 seconds    Heel Raises Limitations  1x 15 reps toes raises, on decline    Functional Squats  10 reps;3  seconds    Functional Squats Limitations  front of chair and min cueing for mechanics    Forward Lunge  10 reps;Limitations    Forward Lunge Limitations  Bil LE's, 4" step, no UE assist  Other Standing Lumbar Exercises  1x 10 reps bil LE, hip extension/hip abduction with red TB at ankles      Manual Therapy   Manual Therapy  Soft tissue mobilization    Manual therapy comments   Manual therapy completed seperate from other interventions    Soft tissue mobilization  to bilteral lumbar/glutes in prone lying               PT Short Term Goals - 08/20/18 1654      PT SHORT TERM GOAL #1   Title  Patient will be independent with HEP, updated PRN, to improve LE strength to improve posture and reduce LBP with mobility.    Time  2    Period  Weeks    Status  On-going        PT Long Term Goals - 08/20/18 1655      PT LONG TERM GOAL #1   Title  Patient will improve MMT to 5/5 thorughout bil LE to demonstrate significant improvement in functional strength to improve activity tolerance and endurance.    Time  4    Period  Weeks    Status  On-going      PT LONG TERM GOAL #2   Title  Patient will improve lumbar ROM to WNL's for all planes and be pain free throughout to demosntrate improved mobility and position tolerance.    Time  4    Period  Weeks    Status  On-going      PT LONG TERM GOAL #3   Title  Patient will deny symptoms radiating beyond knees to demonstrate centralizaiton of radicular symptoms in bil LE's to indicate reduce sciatic nerve comrpession and reduce patient's pain.     Time  4    Period  Weeks    Status  On-going      PT LONG TERM GOAL #4   Title  Patient will reports being able to sleep through the night for 1 week straight without and increase in pain to improve QOL and overal health/wellness.    Time  4    Period  Weeks    Status  On-going            Plan - 08/27/18 0948    Clinical Impression Statement  Session focus on hip strengthening and  lumbar mobility.  Added functional squats for gluteal strengthening with min cueing and infront of chair to improve mechanics.  Stretches complete for pain control and mobility with reports of relief especially following POE.  EOS with manual soft tissue mobilization to address restricitons in paraspinals and gluteal musculature      Rehab Potential  Good    PT Frequency  2x / week    PT Duration  6 weeks    PT Treatment/Interventions  ADLs/Self Care Home Management;Aquatic Therapy;Cryotherapy;Electrical Stimulation;Moist Heat;Traction;DME Instruction;Gait training;Stair training;Functional mobility training;Therapeutic activities;Therapeutic exercise;Balance training;Neuromuscular re-education;Patient/family education;Manual techniques;Passive range of motion;Taping;Spinal Manipulations;Joint Manipulations    PT Next Visit Plan  Add prone press up next session.  Continue to monitor BP and HR throughout. Continue PA's to lumbar spine if with PT and perform extension based exercises initially. Initiate hip strengthening and core stabilization and perform manual soft tissue work PRN along Lt sciatic nerve tract.     PT Home Exercise Plan  Eval: prone on elbows, piriformis stretch, LTR, bridge       Patient will benefit from skilled therapeutic intervention in order to improve the following deficits and impairments:  Decreased  endurance, Decreased range of motion, Decreased strength, Impaired sensation, Pain, Postural dysfunction, Difficulty walking, Cardiopulmonary status limiting activity, Decreased balance, Decreased mobility, Decreased activity tolerance, Hypomobility, Obesity  Visit Diagnosis: Chronic bilateral low back pain with left-sided sciatica  Muscle weakness (generalized)  Difficulty in walking, not elsewhere classified     Problem List Patient Active Problem List   Diagnosis Date Noted  . Exertional chest pain 08/01/2018  . Family history of melanoma 07/31/2018  . Renal cancer,  left ([place]) 07/31/2018  . Renal mass, left 06/19/2018  . Clostridium difficile diarrhea   . Weakness   . Hypokalemia, gastrointestinal losses 05/25/2018  . Hypokalemia 05/24/2018  . Dizziness 10/29/2017  . Gait abnormality 10/29/2017  . Tremor, essential 10/29/2017  . Diplopia 10/29/2017  . Obstructive sleep apnea on CPAP 12/10/2016  . Plantar fasciitis, bilateral 12/10/2016  . Fibromyalgia 11/09/2016  . IBS (irritable bowel syndrome) 11/09/2016  . GERD (gastroesophageal reflux disease) 11/09/2016  . Anxiety and depression 11/09/2016  . MGUS (monoclonal gammopathy of unknown significance) 03/15/2016  . Family history of breast cancer   . Family history of colon cancer   . Breast cancer of lower-inner quadrant of right female breast ([place]) 08/26/2015  . Headache 07/17/2015  . Hypertension 07/17/2015  . Obesity (BMI 35.0-39.9 without comorbidity) 07/17/2015   Ihor Austin, [place]; [place]  Aldona Lento 08/27/2018, 9:55 AM  [place], [place], [address] Phone: [phone]   Fax:  [phone]  Name: Sarah Cooley MRN: [id number] Date of Birth: 04/09/1966

## 2018-08-27 NOTE — Therapy (Signed)
[place], [place], [address] Phone: [phone]   Fax:  [phone]  Occupational Therapy Treatment  Patient Details  Name: Sarah Cooley MRN: [id number] Date of Birth: 16-Jun-1966 Referring Provider (OT): Marchia Bond, MD    Encounter Date: 08/27/2018  OT End of Session - 08/27/18 1218    Visit Number  3    Number of Visits  8    Date for OT Re-Evaluation  09/11/18    Authorization Type  1) BCBS Calendar year plan (PT/OT/chiro 12 out of 30 used with 18 remaining)     Authorization - Visit Number  3    Authorization - Number of Visits  8    OT Start Time  [phone]    OT Stop Time  1031    OT Time Calculation (min)  43 min    Activity Tolerance  Patient tolerated treatment well    Behavior During Therapy  [facility] for tasks assessed/performed       Past Medical History:  Diagnosis Date  . Allergy    seasonal  . Anxiety   . Arthritis   . Balance problem   . Brain aneurysm    2 mm ACA region aneurysm by 09/21/15 MRA  . Breast cancer ([place])   . Breast cancer of lower-inner quadrant of right female breast ([place]) 08/26/2015  . Chronic kidney disease    acute renal failure one occasion  . Complication of anesthesia    heart rate drops and O2 sats drop  . Constipation   . Depression   . Diplopia 10/29/2017  . Dizziness 10/29/2017  . Elevated liver function tests   . Family history of adverse reaction to anesthesia    mom has n/v  . Family history of breast cancer   . Family history of colon cancer   . Fibromyalgia   . Fibromyalgia   . Gait abnormality 10/29/2017  . Genetic testing 09/08/2015   Negative genetic testing on the Breast/High Moderate Risk panel. The Breast High/Moderate Risk gene panel offered by [facility] includes sequencing and deletion/duplication analysis of the following 9 genes: ATM, BRCA1, BRCA2, CDH1, CHEK2, PALB2, PTEN, STK11, and TP53. The report date is September 08, 2015.  The Rest of the  Comprehensive Cancer panel has been reflexed and will be available in 2-3 weeks.  POLD1 c.208G>T VUS found on the Remainder of the Comprehensive cancer panel.  The Comprehensive Cancer Panel offered by [facility] includes sequencing and/or deletion duplication testing of the following 32 genes: APC, ATM, AXIN2, BARD1, BMPR1A, BRCA1, BRCA2, BRIP1, CDH1, CDK4, CDKN2A, CHEK2, EPCAM, FANCC, MLH1, MSH2, MSH6, MUTYH, NBN, PALB2, PMS2, POLD1, POLE, PTEN, RAD51C, RAD51D, SCG5/GREM1, SMAD4, STK11, TP53, VHL, and XRCC2.   The report date is 09/12/2015.   Marland Kitchen GERD (gastroesophageal reflux disease)    none recent  . H/O acute renal failure 09/22/15   admitted to [facility]  . Headache    occipital and temporal  . History of hiatal hernia   . Hyperlipemia   . Hyperlipidemia   . Hypertension   . Irritable bowel syndrome (IBS)   . MGUS (monoclonal gammopathy of unknown significance) 03/15/2016  . Neuromuscular disorder (HCC)    fibromyalgia  . Occasional tremors   . OSA (obstructive sleep apnea)    uses cpap with humidification- auto   . Personal history of radiation therapy   . PONV (postoperative nausea and vomiting)   . Renal cell carcinoma ([place])   . Tremor, essential 10/29/2017  Past Surgical History:  Procedure Laterality Date  . ABDOMINAL HYSTERECTOMY     adenomyosis  . ABDOMINAL HYSTERECTOMY    . BLADDER SURGERY    . BONE GRAFT HIP ILIAC CREST    . BREAST BIOPSY Bilateral   . BREAST LUMPECTOMY    . BREAST SURGERY    . CHOLECYSTECTOMY    . FRACTURE SURGERY Right    wrist  . KNEE ARTHROSCOPY    . KNEE ARTHROSCOPY W/ ACL RECONSTRUCTION     right  . RADIOACTIVE SEED GUIDED PARTIAL MASTECTOMY WITH AXILLARY SENTINEL LYMPH NODE BIOPSY Right 10/13/2015   Procedure: RADIOACTIVE SEED GUIDED PARTIAL MASTECTOMY WITH AXILLARY SENTINEL LYMPH NODE BIOPSY;  Surgeon: Erroll Luna, MD;  Location: [facility];  Service: General;  Laterality: Right;  . right carpal and cubital tunnel release     . ROBOTIC  ASSITED PARTIAL NEPHRECTOMY Left 06/19/2018   Procedure: XI ROBOTIC ASSITED PARTIAL NEPHRECTOMY;  Surgeon: Ardis Hughs, MD;  [facility];  Service: Urology;  Laterality: Left;  . WRIST SURGERY      There were no vitals filed for this visit.  Subjective Assessment - 08/27/18 1217    Subjective   I'm having a lot less pain now, I think because I've been doing my homework.     Currently in Pain?  No/denies    Pain Score  0-No pain    Pain Onset  More than a month ago         [facility] OT Assessment - 08/27/18 0957      Assessment   Medical Diagnosis  Left arm pain       Precautions   Precautions  Fall               OT Treatments/Exercises (OP) - 08/27/18 0957      Exercises   Exercises  Shoulder      Shoulder Exercises: Supine   Protraction  PROM;5 reps;AROM;10 reps    Horizontal ABduction  PROM;5 reps;AROM;10 reps    External Rotation  PROM;5 reps;AROM;10 reps    Internal Rotation  PROM;5 reps;AROM;10 reps    Flexion  PROM;5 reps;AROM;10 reps    ABduction  PROM;5 reps;AROM;10 reps      Shoulder Exercises: Seated   Elevation  AROM;20 reps   standing   Other Seated Exercises  X to V arms 10X seated      Shoulder Exercises: Standing   Protraction  AROM;10 reps    Horizontal ABduction  AROM;10 reps    External Rotation  AROM;10 reps    Internal Rotation  AROM;10 reps    Flexion  AROM;10 reps    ABduction  AROM;10 reps    Extension  AROM;10 reps    Row  AROM;10 reps    Shoulder Elevation  AROM   20   Other Standing Exercises  Y lift off 10X      Shoulder Exercises: ROM/Strengthening   "W" Arms  10X standing, min fatigue     X to V Arms  10X standing, min fatigue     Proximal Shoulder Strengthening, Supine  12X    Proximal Shoulder Strengthening, Seated  15X mod fatigue        Shoulder Exercises: Stretch   Star Gazer Stretch  5 reps;10 seconds   standing      Manual Therapy   Manual Therapy  Myofascial release    Manual therapy comments    Manual therapy completed prior to therapy exercises     Myofascial Release  Myofascial release to left upper arm, deltoid, and trapezius regions to decrease pain and fascial restrictions and increase joint range of motion              OT Education - 08/27/18 1011    Education Details  Shoulder AROM HEP    Person(s) Educated  Patient    Methods  Explanation;Demonstration;Verbal cues;Handout    Comprehension  Verbalized understanding;Returned demonstration       OT Short Term Goals - 08/20/18 1837      OT SHORT TERM GOAL #1   Title  Pt will be educated on and independent in HEP for improving functional use of LUE during ADLs.      Time  4    Period  Weeks    Status  On-going      OT SHORT TERM GOAL #2   Title  Pt will improve LUE A/ROM to WNL to improve ability to perform dressing tasks.      Time  4    Period  Weeks    Status  On-going      OT SHORT TERM GOAL #3   Title  Pt will decrease pain to 3/10 or less to improve ability to perform functional reaching activities while showering.      Time  4    Period  Weeks    Status  On-going      OT SHORT TERM GOAL #4   Title   Pt will improve strength to 5/5 to improve ability to complete household chores.      Time  4    Period  Weeks    Status  On-going      OT SHORT TERM GOAL #5   Title  Pt will decrease fascial restriction to trace amounts in order to be able to reach her high kitchen cabinets while cooking.     Time  4    Period  Weeks    Status  On-going               Plan - 08/27/18 1220    Clinical Impression Statement  A: Patient displaying good progress this session with increased ROM and activity tolerance. She was able to progress with A/ROM exercises and HEP was updated for A/ROM. Pt verbalized and demonstrated understanding of all education provided. VC for form and technique.      Plan  P: Continue with manual therapy to address fascial restrictions. Progress shoulder exercises as tolerated. Add  red scapular theraband.        Patient will benefit from skilled therapeutic intervention in order to improve the following deficits and impairments:  Decreased activity tolerance, Decreased strength, Impaired flexibility, Decreased range of motion, Pain, Increased fascial restrictions, Impaired UE functional use  Visit Diagnosis: Pain in left upper arm  Other symptoms and signs involving the musculoskeletal system    Problem List Patient Active Problem List   Diagnosis Date Noted  . Exertional chest pain 08/01/2018  . Family history of melanoma 07/31/2018  . Renal cancer, left ([place]) 07/31/2018  . Renal mass, left 06/19/2018  . Clostridium difficile diarrhea   . Weakness   . Hypokalemia, gastrointestinal losses 05/25/2018  . Hypokalemia 05/24/2018  . Dizziness 10/29/2017  . Gait abnormality 10/29/2017  . Tremor, essential 10/29/2017  . Diplopia 10/29/2017  . Obstructive sleep apnea on CPAP 12/10/2016  . Plantar fasciitis, bilateral 12/10/2016  . Fibromyalgia 11/09/2016  . IBS (irritable bowel syndrome) 11/09/2016  . GERD (gastroesophageal reflux disease) 11/09/2016  .  Anxiety and depression 11/09/2016  . MGUS (monoclonal gammopathy of unknown significance) 03/15/2016  . Family history of breast cancer   . Family history of colon cancer   . Breast cancer of lower-inner quadrant of right female breast ([place]) 08/26/2015  . Headache 07/17/2015  . Hypertension 07/17/2015  . Obesity (BMI 35.0-39.9 without comorbidity) 07/17/2015    Toney Rakes, OT student 08/27/2018, 12:23 PM  [place] [address] [place], [place], [address] Phone: [phone]   Fax:  [phone]  Name: YUDIT MODESITT MRN: [id number] Date of Birth: 04-21-1966

## 2018-09-01 ENCOUNTER — Ambulatory Visit (HOSPITAL_COMMUNITY): Payer: BLUE CROSS/BLUE SHIELD

## 2018-09-02 ENCOUNTER — Encounter (HOSPITAL_COMMUNITY): Payer: Self-pay

## 2018-09-02 ENCOUNTER — Ambulatory Visit (HOSPITAL_COMMUNITY): Payer: BLUE CROSS/BLUE SHIELD | Admitting: Occupational Therapy

## 2018-09-02 ENCOUNTER — Ambulatory Visit (HOSPITAL_COMMUNITY): Payer: BLUE CROSS/BLUE SHIELD

## 2018-09-02 ENCOUNTER — Encounter (HOSPITAL_COMMUNITY): Payer: Self-pay | Admitting: Occupational Therapy

## 2018-09-02 DIAGNOSIS — M6281 Muscle weakness (generalized): Secondary | ICD-10-CM | POA: Diagnosis not present

## 2018-09-02 DIAGNOSIS — R262 Difficulty in walking, not elsewhere classified: Secondary | ICD-10-CM

## 2018-09-02 DIAGNOSIS — M5442 Lumbago with sciatica, left side: Principal | ICD-10-CM

## 2018-09-02 DIAGNOSIS — M79622 Pain in left upper arm: Secondary | ICD-10-CM

## 2018-09-02 DIAGNOSIS — R29898 Other symptoms and signs involving the musculoskeletal system: Secondary | ICD-10-CM

## 2018-09-02 DIAGNOSIS — G8929 Other chronic pain: Secondary | ICD-10-CM | POA: Diagnosis not present

## 2018-09-02 NOTE — Patient Instructions (Addendum)

## 2018-09-02 NOTE — Therapy (Signed)
[place], [place], [address] Phone: [phone]   Fax:  [phone]  Occupational Therapy Treatment  Patient Details  Name: Sarah Cooley MRN: [id number] Date of Birth: 12/25/1965 Referring Provider (OT): Marchia Bond, MD    Encounter Date: 09/02/2018  OT End of Session - 09/02/18 1559    Visit Number  4    Number of Visits  8    Date for OT Re-Evaluation  09/11/18    Authorization Type  1) [employer] Calendar year plan (PT/OT/chiro 12 out of 30 used with 18 remaining)     Authorization - Visit Number  4    Authorization - Number of Visits  8    OT Start Time  2197    OT Stop Time  1557    OT Time Calculation (min)  41 min    Activity Tolerance  Patient tolerated treatment well    Behavior During Therapy  [facility] for tasks assessed/performed       Past Medical History:  Diagnosis Date  . Allergy    seasonal  . Anxiety   . Arthritis   . Balance problem   . Brain aneurysm    2 mm ACA region aneurysm by 09/21/15 MRA  . Breast cancer ([place])   . Breast cancer of lower-inner quadrant of right female breast ([place]) 08/26/2015  . Chronic kidney disease    acute renal failure one occasion  . Complication of anesthesia    heart rate drops and O2 sats drop  . Constipation   . Depression   . Diplopia 10/29/2017  . Dizziness 10/29/2017  . Elevated liver function tests   . Family history of adverse reaction to anesthesia    mom has n/v  . Family history of breast cancer   . Family history of colon cancer   . Fibromyalgia   . Fibromyalgia   . Gait abnormality 10/29/2017  . Genetic testing 09/08/2015   Negative genetic testing on the Breast/High Moderate Risk panel. The Breast High/Moderate Risk gene panel offered by [facility] includes sequencing and deletion/duplication analysis of the following 9 genes: ATM, BRCA1, BRCA2, CDH1, CHEK2, PALB2, PTEN, STK11, and TP53. The report date is September 08, 2015.  The Rest of the  Comprehensive Cancer panel has been reflexed and will be available in 2-3 weeks.  POLD1 c.208G>T VUS found on the Remainder of the Comprehensive cancer panel.  The Comprehensive Cancer Panel offered by [facility] includes sequencing and/or deletion duplication testing of the following 32 genes: APC, ATM, AXIN2, BARD1, BMPR1A, BRCA1, BRCA2, BRIP1, CDH1, CDK4, CDKN2A, CHEK2, EPCAM, FANCC, MLH1, MSH2, MSH6, MUTYH, NBN, PALB2, PMS2, POLD1, POLE, PTEN, RAD51C, RAD51D, SCG5/GREM1, SMAD4, STK11, TP53, VHL, and XRCC2.   The report date is 09/12/2015.   Marland Kitchen GERD (gastroesophageal reflux disease)    none recent  . H/O acute renal failure 09/22/15   admitted to [facility]  . Headache    occipital and temporal  . History of hiatal hernia   . Hyperlipemia   . Hyperlipidemia   . Hypertension   . Irritable bowel syndrome (IBS)   . MGUS (monoclonal gammopathy of unknown significance) 03/15/2016  . Neuromuscular disorder (HCC)    fibromyalgia  . Occasional tremors   . OSA (obstructive sleep apnea)    uses cpap with humidification- auto   . Personal history of radiation therapy   . PONV (postoperative nausea and vomiting)   . Renal cell carcinoma ([place])   . Tremor, essential 10/29/2017  Past Surgical History:  Procedure Laterality Date  . ABDOMINAL HYSTERECTOMY     adenomyosis  . ABDOMINAL HYSTERECTOMY    . BLADDER SURGERY    . BONE GRAFT HIP ILIAC CREST    . BREAST BIOPSY Bilateral   . BREAST LUMPECTOMY    . BREAST SURGERY    . CHOLECYSTECTOMY    . FRACTURE SURGERY Right    wrist  . KNEE ARTHROSCOPY    . KNEE ARTHROSCOPY W/ ACL RECONSTRUCTION     right  . RADIOACTIVE SEED GUIDED PARTIAL MASTECTOMY WITH AXILLARY SENTINEL LYMPH NODE BIOPSY Right 10/13/2015   Procedure: RADIOACTIVE SEED GUIDED PARTIAL MASTECTOMY WITH AXILLARY SENTINEL LYMPH NODE BIOPSY;  Surgeon: Erroll Luna, MD;  Location: [facility];  Service: General;  Laterality: Right;  . right carpal and cubital tunnel release     . ROBOTIC  ASSITED PARTIAL NEPHRECTOMY Left 06/19/2018   Procedure: XI ROBOTIC ASSITED PARTIAL NEPHRECTOMY;  Surgeon: Ardis Hughs, MD;  [facility];  Service: Urology;  Laterality: Left;  . WRIST SURGERY      There were no vitals filed for this visit.  Subjective Assessment - 09/02/18 1516    Subjective   S: It's actually been feeling pretty good.     Currently in Pain?  No/denies         [facility] OT Assessment - 09/02/18 1516      Assessment   Medical Diagnosis  Left arm pain       Precautions   Precautions  Fall               OT Treatments/Exercises (OP) - 09/02/18 1518      Exercises   Exercises  Shoulder      Shoulder Exercises: Supine   Protraction  PROM;5 reps;Strengthening;10 reps    Protraction Weight (lbs)  1    Horizontal ABduction  PROM;5 reps;Strengthening;10 reps    Horizontal ABduction Weight (lbs)  1    External Rotation  PROM;5 reps;Strengthening;10 reps    External Rotation Weight (lbs)  1    Internal Rotation  PROM;5 reps;Strengthening;10 reps    Internal Rotation Weight (lbs)  1    Flexion  PROM;5 reps;Strengthening;10 reps    Shoulder Flexion Weight (lbs)  1    ABduction  PROM;5 reps;Strengthening;10 reps    Shoulder ABduction Weight (lbs)  1      Shoulder Exercises: Standing   Protraction  Strengthening;10 reps    Protraction Weight (lbs)  1    Horizontal ABduction  Strengthening;10 reps    Horizontal ABduction Weight (lbs)  1    External Rotation  Strengthening;10 reps    External Rotation Weight (lbs)  1    Internal Rotation  Strengthening;10 reps    Internal Rotation Weight (lbs)  1    Flexion  Strengthening;10 reps    Shoulder Flexion Weight (lbs)  1    ABduction  Strengthening;10 reps    Shoulder ABduction Weight (lbs)  1    Extension  Theraband;10 reps    Theraband Level (Shoulder Extension)  Level 2 (Red)    Row  Theraband;10 reps    Theraband Level (Shoulder Row)  Level 2 (Red)    Retraction  Theraband;10 reps     Theraband Level (Shoulder Retraction)  Level 2 (Red)    Other Standing Exercises  Y lift off 15X      Shoulder Exercises: Therapy Ball   Other Therapy Ball Exercises  green ball: chest press, overhead press, flexion, circles both directions, 10X  each      Shoulder Exercises: ROM/Strengthening   "W" Arms  10X, 1#    X to V Arms  12X, 1#    Proximal Shoulder Strengthening, Supine  10X each, 1# weight no rest breaks    Proximal Shoulder Strengthening, Seated  10X each, 1# weight, no rest breaks    Ball on Wall  1' flexion       Manual Therapy   Manual Therapy  Myofascial release    Manual therapy comments   Manual therapy completed prior to therapy exercises     Myofascial Release  Myofascial release to left upper arm, deltoid, and trapezius regions to decrease pain and fascial restrictions and increase joint range of motion              OT Education - 09/02/18 1547    Education Details  scapular theraband-red    Person(s) Educated  Patient    Methods  Explanation;Demonstration;Verbal cues;Handout    Comprehension  Verbalized understanding;Returned demonstration       OT Short Term Goals - 08/20/18 1837      OT SHORT TERM GOAL #1   Title  Pt will be educated on and independent in HEP for improving functional use of LUE during ADLs.      Time  4    Period  Weeks    Status  On-going      OT SHORT TERM GOAL #2   Title  Pt will improve LUE A/ROM to WNL to improve ability to perform dressing tasks.      Time  4    Period  Weeks    Status  On-going      OT SHORT TERM GOAL #3   Title  Pt will decrease pain to 3/10 or less to improve ability to perform functional reaching activities while showering.      Time  4    Period  Weeks    Status  On-going      OT SHORT TERM GOAL #4   Title   Pt will improve strength to 5/5 to improve ability to complete household chores.      Time  4    Period  Weeks    Status  On-going      OT SHORT TERM GOAL #5   Title  Pt will decrease  fascial restriction to trace amounts in order to be able to reach her high kitchen cabinets while cooking.     Time  4    Period  Weeks    Status  On-going               Plan - 09/02/18 1542    Clinical Impression Statement  A: Manual therapy completed to LUE to address pain and fascial restrictions limiting ROM and functional use-min restrictions palpated in trapezius and medial deloid today. Pt reporting no more random pain and her arm has been feeling really good. Progressed to strengthening today, adding 1# weights in supine and standing, as well as green therapy ball exercises. Verbal cuing for form and technique, pt reporting min muscle burn/fatigue at end of session.     Plan  P: Manual therapy prn. Continue with strengthening and add sidelying exercises for scapular stability.        Patient will benefit from skilled therapeutic intervention in order to improve the following deficits and impairments:  Decreased activity tolerance, Decreased strength, Impaired flexibility, Decreased range of motion, Pain, Increased fascial restrictions, Impaired UE functional use  Visit Diagnosis: Pain in left upper arm  Other symptoms and signs involving the musculoskeletal system    Problem List Patient Active Problem List   Diagnosis Date Noted  . Exertional chest pain 08/01/2018  . Family history of melanoma 07/31/2018  . Renal cancer, left ([place]) 07/31/2018  . Renal mass, left 06/19/2018  . Clostridium difficile diarrhea   . Weakness   . Hypokalemia, gastrointestinal losses 05/25/2018  . Hypokalemia 05/24/2018  . Dizziness 10/29/2017  . Gait abnormality 10/29/2017  . Tremor, essential 10/29/2017  . Diplopia 10/29/2017  . Obstructive sleep apnea on CPAP 12/10/2016  . Plantar fasciitis, bilateral 12/10/2016  . Fibromyalgia 11/09/2016  . IBS (irritable bowel syndrome) 11/09/2016  . GERD (gastroesophageal reflux disease) 11/09/2016  . Anxiety and depression 11/09/2016  . MGUS  (monoclonal gammopathy of unknown significance) 03/15/2016  . Family history of breast cancer   . Family history of colon cancer   . Breast cancer of lower-inner quadrant of right female breast ([place]) 08/26/2015  . Headache 07/17/2015  . Hypertension 07/17/2015  . Obesity (BMI 35.0-39.9 without comorbidity) 07/17/2015   Guadelupe Sabin, OTR/L  [phone] 09/02/2018, 4:00 PM  [place] [address] [place], [place], [address] Phone: [phone]   Fax:  [phone]  Name: Sarah Cooley MRN: [id number] Date of Birth: 1966-05-29

## 2018-09-02 NOTE — Therapy (Signed)
[place], [place], [address] Phone: [phone]   Fax:  [phone]  Physical Therapy Treatment  Patient Details  Name: Sarah Cooley MRN: [id number] Date of Birth: Sep 05, 1966 Referring Provider (PT): Kathrynn Ducking, MD   Encounter Date: 09/02/2018  PT End of Session - 09/02/18 1428    Visit Number  5    Number of Visits  13    Date for PT Re-Evaluation  09/25/18   Minireassess 09/04/2018   Authorization Type  BCBS (30 visit limit PT/OT/Chiro combined, 12 used at eval and pt in OT; no auth required)    Authorization Time Period  08/14/18 - 09/26/18    Authorization - Visit Number  5    Authorization - Number of Visits  9    PT Start Time  2595    PT Stop Time  1509    PT Time Calculation (min)  44 min    Activity Tolerance  Patient tolerated treatment well    Behavior During Therapy  [facility] for tasks assessed/performed       Past Medical History:  Diagnosis Date  . Allergy    seasonal  . Anxiety   . Arthritis   . Balance problem   . Brain aneurysm    2 mm ACA region aneurysm by 09/21/15 MRA  . Breast cancer ([place])   . Breast cancer of lower-inner quadrant of right female breast ([place]) 08/26/2015  . Chronic kidney disease    acute renal failure one occasion  . Complication of anesthesia    heart rate drops and O2 sats drop  . Constipation   . Depression   . Diplopia 10/29/2017  . Dizziness 10/29/2017  . Elevated liver function tests   . Family history of adverse reaction to anesthesia    mom has n/v  . Family history of breast cancer   . Family history of colon cancer   . Fibromyalgia   . Fibromyalgia   . Gait abnormality 10/29/2017  . Genetic testing 09/08/2015   Negative genetic testing on the Breast/High Moderate Risk panel. The Breast High/Moderate Risk gene panel offered by [facility] includes sequencing and deletion/duplication analysis of the following 9 genes: ATM, BRCA1, BRCA2, CDH1, CHEK2,  PALB2, PTEN, STK11, and TP53. The report date is September 08, 2015.  The Rest of the Comprehensive Cancer panel has been reflexed and will be available in 2-3 weeks.  POLD1 c.208G>T VUS found on the Remainder of the Comprehensive cancer panel.  The Comprehensive Cancer Panel offered by [facility] includes sequencing and/or deletion duplication testing of the following 32 genes: APC, ATM, AXIN2, BARD1, BMPR1A, BRCA1, BRCA2, BRIP1, CDH1, CDK4, CDKN2A, CHEK2, EPCAM, FANCC, MLH1, MSH2, MSH6, MUTYH, NBN, PALB2, PMS2, POLD1, POLE, PTEN, RAD51C, RAD51D, SCG5/GREM1, SMAD4, STK11, TP53, VHL, and XRCC2.   The report date is 09/12/2015.   Marland Kitchen GERD (gastroesophageal reflux disease)    none recent  . H/O acute renal failure 09/22/15   admitted to [facility]  . Headache    occipital and temporal  . History of hiatal hernia   . Hyperlipemia   . Hyperlipidemia   . Hypertension   . Irritable bowel syndrome (IBS)   . MGUS (monoclonal gammopathy of unknown significance) 03/15/2016  . Neuromuscular disorder (HCC)    fibromyalgia  . Occasional tremors   . OSA (obstructive sleep apnea)    uses cpap with humidification- auto   . Personal history of radiation therapy   . PONV (postoperative nausea  and vomiting)   . Renal cell carcinoma ([place])   . Tremor, essential 10/29/2017    Past Surgical History:  Procedure Laterality Date  . ABDOMINAL HYSTERECTOMY     adenomyosis  . ABDOMINAL HYSTERECTOMY    . BLADDER SURGERY    . BONE GRAFT HIP ILIAC CREST    . BREAST BIOPSY Bilateral   . BREAST LUMPECTOMY    . BREAST SURGERY    . CHOLECYSTECTOMY    . FRACTURE SURGERY Right    wrist  . KNEE ARTHROSCOPY    . KNEE ARTHROSCOPY W/ ACL RECONSTRUCTION     right  . RADIOACTIVE SEED GUIDED PARTIAL MASTECTOMY WITH AXILLARY SENTINEL LYMPH NODE BIOPSY Right 10/13/2015   Procedure: RADIOACTIVE SEED GUIDED PARTIAL MASTECTOMY WITH AXILLARY SENTINEL LYMPH NODE BIOPSY;  Surgeon: Erroll Luna, MD;  Location: [place];  Service:  General;  Laterality: Right;  . right carpal and cubital tunnel release     . ROBOTIC ASSITED PARTIAL NEPHRECTOMY Left 06/19/2018   Procedure: XI ROBOTIC ASSITED PARTIAL NEPHRECTOMY;  Surgeon: Ardis Hughs, MD;  [facility];  Service: Urology;  Laterality: Left;  . WRIST SURGERY      There were no vitals filed for this visit.  Subjective Assessment - 09/02/18 1424    Subjective  Pt stated she feld better following last session, was able all her HEP and function over weekend.  Current pain scale 4/10 mainly achey pain.  Reports improvements with BP new medication, continues to go up and down but not big extremes.    Pertinent History  06/19/18 Lt partial nephrectomy, breast cancer 2016, pulmonary HTN, fibromyalgia, sleep apnea    Patient Stated Goals  to have less pain and be able to sleep through the night    Currently in Pain?  Yes    Pain Score  4     Pain Location  Back    Pain Orientation  Lower    Pain Descriptors / Indicators  Aching    Pain Type  Chronic pain    Pain Radiating Towards  Lt LE down to heels    Pain Onset  More than a month ago    Pain Frequency  Intermittent    Aggravating Factors   Reaching and lifting    Pain Relieving Factors  medication, rest    Effect of Pain on Daily Activities  Mod effect on daily activities                       [facility] Adult PT Treatment/Exercise - 09/02/18 0001      Exercises   Exercises  Lumbar      Lumbar Exercises: Stretches   Standing Extension  10 reps    Standing Extension Limitations  REIS 10x 3" holds    Prone on Elbows Stretch  Limitations    Prone on Elbows Stretch Limitations  2 min    Press Ups Limitations  limited wrist mobility due to arthitis    Figure 4 Stretch  2 reps;30 seconds;Without overpressure    Figure 4 Stretch Limitations  towel assistance      Lumbar Exercises: Standing   Heel Raises  15 reps;3 seconds    Heel Raises Limitations  1x 15 reps toes raises, on decline     Functional Squats  10 reps;3 seconds    Functional Squats Limitations  front of chair, 3D hip excursion    Forward Lunge  15 reps    Forward Lunge Limitations  Bil  LE's, 4" step, no UE assist    Other Standing Lumbar Exercises  SLS Rt 30", LT 40" max    Other Standing Lumbar Exercises  1x 10 reps bil LE, hip extension/hip abduction with red TB at ankles      Lumbar Exercises: Prone   Straight Leg Raise  10 reps;2 seconds    Straight Leg Raises Limitations  bil LE's    Other Prone Lumbar Exercises  modified plank on forearm and knees      Manual Therapy   Manual Therapy  Myofascial release    Manual therapy comments   Manual therapy completed prior to therapy exercises     Soft tissue mobilization  to bilteral lumbar/glutes in prone lying Rt> Lt               PT Short Term Goals - 08/20/18 1654      PT SHORT TERM GOAL #1   Title  Patient will be independent with HEP, updated PRN, to improve LE strength to improve posture and reduce LBP with mobility.    Time  2    Period  Weeks    Status  On-going        PT Long Term Goals - 08/20/18 1655      PT LONG TERM GOAL #1   Title  Patient will improve MMT to 5/5 thorughout bil LE to demonstrate significant improvement in functional strength to improve activity tolerance and endurance.    Time  4    Period  Weeks    Status  On-going      PT LONG TERM GOAL #2   Title  Patient will improve lumbar ROM to WNL's for all planes and be pain free throughout to demosntrate improved mobility and position tolerance.    Time  4    Period  Weeks    Status  On-going      PT LONG TERM GOAL #3   Title  Patient will deny symptoms radiating beyond knees to demonstrate centralizaiton of radicular symptoms in bil LE's to indicate reduce sciatic nerve comrpession and reduce patient's pain.     Time  4    Period  Weeks    Status  On-going      PT LONG TERM GOAL #4   Title  Patient will reports being able to sleep through the night for 1  week straight without and increase in pain to improve QOL and overal health/wellness.    Time  4    Period  Weeks    Status  On-going            Plan - 09/02/18 1511    Clinical Impression Statement  Continued session focus with hip strengthening and lumbar mobility.  Added SLS to improve core activation to assist with lumbar stability.  Attempted prone press-up, pt too limited with wrist mobility to complete with proper form.  Did add modified planks on forearm and knees for core activation with good form.  EOS wiht manual soft tissue mobilization to address restrictions in paraspinals and gluteal musculature.      Rehab Potential  Good    PT Frequency  2x / week    PT Duration  6 weeks    PT Treatment/Interventions  ADLs/Self Care Home Management;Aquatic Therapy;Cryotherapy;Electrical Stimulation;Moist Heat;Traction;DME Instruction;Gait training;Stair training;Functional mobility training;Therapeutic activities;Therapeutic exercise;Balance training;Neuromuscular re-education;Patient/family education;Manual techniques;Passive range of motion;Taping;Spinal Manipulations;Joint Manipulations    PT Next Visit Plan  Progress core and hip stability next session.  Add tandem  stance wiht paloff.  Continue to monitor BP and HR throughout. Continue PA's to lumbar spine if with PT and perform extension based exercises initially. Initiate hip strengthening and core stabilization and perform manual soft tissue work PRN along Lt sciatic nerve tract.        Patient will benefit from skilled therapeutic intervention in order to improve the following deficits and impairments:  Decreased endurance, Decreased range of motion, Decreased strength, Impaired sensation, Pain, Postural dysfunction, Difficulty walking, Cardiopulmonary status limiting activity, Decreased balance, Decreased mobility, Decreased activity tolerance, Hypomobility, Obesity  Visit Diagnosis: Chronic bilateral low back pain with left-sided  sciatica  Muscle weakness (generalized)  Difficulty in walking, not elsewhere classified     Problem List Patient Active Problem List   Diagnosis Date Noted  . Exertional chest pain 08/01/2018  . Family history of melanoma 07/31/2018  . Renal cancer, left ([place]) 07/31/2018  . Renal mass, left 06/19/2018  . Clostridium difficile diarrhea   . Weakness   . Hypokalemia, gastrointestinal losses 05/25/2018  . Hypokalemia 05/24/2018  . Dizziness 10/29/2017  . Gait abnormality 10/29/2017  . Tremor, essential 10/29/2017  . Diplopia 10/29/2017  . Obstructive sleep apnea on CPAP 12/10/2016  . Plantar fasciitis, bilateral 12/10/2016  . Fibromyalgia 11/09/2016  . IBS (irritable bowel syndrome) 11/09/2016  . GERD (gastroesophageal reflux disease) 11/09/2016  . Anxiety and depression 11/09/2016  . MGUS (monoclonal gammopathy of unknown significance) 03/15/2016  . Family history of breast cancer   . Family history of colon cancer   . Breast cancer of lower-inner quadrant of right female breast ([place]) 08/26/2015  . Headache 07/17/2015  . Hypertension 07/17/2015  . Obesity (BMI 35.0-39.9 without comorbidity) 07/17/2015   Ihor Austin, [place]; [place]  Aldona Lento 09/02/2018, 3:16 PM  [place], [place], [address] Phone: [phone]   Fax:  [phone]  Name: RICKEY FARRIER MRN: [id number] Date of Birth: [birth year]

## 2018-09-03 ENCOUNTER — Other Ambulatory Visit: Payer: Self-pay | Admitting: Neurology

## 2018-09-03 DIAGNOSIS — I2721 Secondary pulmonary arterial hypertension: Secondary | ICD-10-CM | POA: Insufficient documentation

## 2018-09-03 NOTE — Telephone Encounter (Signed)
Revealed negative genetic testing.  Discussed that we do not know why she had breast cancer, or why she was diagnosed with kidney cancer, or why there is cancer in the family. It could be due to a different gene that we are not testing, or maybe our current technology may not be able to pick something up.  It will be important for her to keep in contact with genetics to keep up with whether additional testing may be needed.

## 2018-09-03 NOTE — Progress Notes (Signed)
Cardiology Office Note    Date:  09/08/2018   ID:  Sarah Cooley, DOB 28-Mar-1966, MRN [id number]  PCP:  Celene Squibb, MD  Cardiologist: Carlyle Dolly, MD EPS: None  Chief Complaint  Patient presents with  . Follow-up    History of Present Illness:  Sarah Cooley is a 52 y.o. female with history of severe hypertension with mild troponin elevation.  Lexiscan showed no ischemia and echo showed EF 60 to 65% with no wall motion abnormalities.  Patient had issues with low blood pressure and bradycardia during admission 05/2018 with C. difficile and hypokalemia.  Coreg diltiazem and chlorthalidone were stopped.    Patient last saw Dr. Harl Bowie back 06/11/2018 and BP was stable on low-dose losartan.  She was cleared for left nephrectomy..  Had some occasional lower extremity edema and given Lasix 20 mg as well as potassium 20 mEq to take as needed.  She had dilated main pulmonary artery noted on CT of the chest 12/2017 but prior echo was unable to measure PASP and there was inadequate TR jet, normal RV.  Patient had hospitalization 08/02/2018 for chest pain.  Enzymes negative x3.  EKG unchanged.  Echo completely normal.   Patient added on to my schedule today because of recent CT of the chest 07/31/2018 showing enlarged pulmonary arteries consistent with pulmonary artery hypertension.  2D echo 08/02/2018 normal LVEF, normal RV function pulmonary artery was normal size.  Systolic pressure could not be accurately estimated.  Tricuspid valve was normal.  Transvalvular velocity was normal. PCP added diltiazem and losartan. BP better controlled. Still complains of random right sided chest tightness and shortness of breath when she does little activity such as doing dishes or walking to the mailbox.  She does not think gets panic attacks likes other physicians have suggested.  Also recently diagnosed with Lyme's disease.  Complains of fatigue.     Past Medical History:  Diagnosis Date  .  Allergy    seasonal  . Anxiety   . Arthritis   . Balance problem   . Brain aneurysm    2 mm ACA region aneurysm by 09/21/15 MRA  . Breast cancer ([place])   . Breast cancer of lower-inner quadrant of right female breast ([place]) 08/26/2015  . Chronic kidney disease    acute renal failure one occasion  . Complication of anesthesia    heart rate drops and O2 sats drop  . Constipation   . Depression   . Diplopia 10/29/2017  . Dizziness 10/29/2017  . Elevated liver function tests   . Family history of adverse reaction to anesthesia    mom has n/v  . Family history of breast cancer   . Family history of colon cancer   . Fibromyalgia   . Fibromyalgia   . Gait abnormality 10/29/2017  . Genetic testing 09/08/2015   Negative genetic testing on the Breast/High Moderate Risk panel. The Breast High/Moderate Risk gene panel offered by [facility] includes sequencing and deletion/duplication analysis of the following 9 genes: ATM, BRCA1, BRCA2, CDH1, CHEK2, PALB2, PTEN, STK11, and TP53. The report date is September 08, 2015.  The Rest of the Comprehensive Cancer panel has been reflexed and will be available in 2-3 weeks.  POLD1 c.208G>T VUS found on the Remainder of the Comprehensive cancer panel.  The Comprehensive Cancer Panel offered by [facility] includes sequencing and/or deletion duplication testing of the following 32 genes: APC, ATM, AXIN2, BARD1, BMPR1A, BRCA1, BRCA2, BRIP1, CDH1, CDK4, CDKN2A, CHEK2, EPCAM, FANCC, MLH1,  MSH2, MSH6, MUTYH, NBN, PALB2, PMS2, POLD1, POLE, PTEN, RAD51C, RAD51D, SCG5/GREM1, SMAD4, STK11, TP53, VHL, and XRCC2.   The report date is 09/12/2015.   Marland Kitchen GERD (gastroesophageal reflux disease)    none recent  . H/O acute renal failure 09/22/15   ad[facility]  . Headache    occipital and temporal  . History of hiatal hernia   . Hyperlipemia   . Hyperlipidemia   . Hypertension   . Irritable bowel syndrome (IBS)   . MGUS (monoclonal gammopathy of unknown significance)  03/15/2016  . Neuromuscular disorder (HCC)    fibromyalgia  . Occasional tremors   . OSA (obstructive sleep apnea)    uses cpap with humidification- auto   . Personal history of radiation therapy   . PONV (postoperative nausea and vomiting)   . Renal cell carcinoma ([place])   . Tremor, essential 10/29/2017    Past Surgical History:  Procedure Laterality Date  . ABDOMINAL HYSTERECTOMY     adenomyosis  . ABDOMINAL HYSTERECTOMY    . BLADDER SURGERY    . BONE GRAFT HIP ILIAC CREST    . BREAST BIOPSY Bilateral   . BREAST LUMPECTOMY    . BREAST SURGERY    . CHOLECYSTECTOMY    . FRACTURE SURGERY Right    wrist  . KNEE ARTHROSCOPY    . KNEE ARTHROSCOPY W/ ACL RECONSTRUCTION     right  . RADIOACTIVE SEED GUIDED PARTIAL MASTECTOMY WITH AXILLARY SENTINEL LYMPH NODE BIOPSY Right 10/13/2015   Procedure: RADIOACTIVE SEED GUIDED PARTIAL MASTECTOMY WITH AXILLARY SENTINEL LYMPH NODE BIOPSY;  Surgeon: Erroll Luna, MD;  Location: [facility];  Service: General;  Laterality: Right;  . right carpal and cubital tunnel release     . ROBOTIC ASSITED PARTIAL NEPHRECTOMY Left 06/19/2018   Procedure: XI ROBOTIC ASSITED PARTIAL NEPHRECTOMY;  Surgeon: Ardis Hughs, MD;  Location: [facility];  Service: Urology;  Laterality: Left;  . WRIST SURGERY      Current Medications: Current Meds  Medication Sig  . calcium carbonate (TUMS - DOSED IN MG ELEMENTAL CALCIUM) 500 MG chewable tablet Chew 1 tablet (200 mg of elemental calcium total) by mouth 2 (two) times daily with breakfast and lunch.  . dicyclomine (BENTYL) 20 MG tablet Take 20 mg by mouth as needed.   Marland Kitchen DILT-XR 120 MG 24 hr capsule Take 120 mg by mouth daily.   Marland Kitchen FLUoxetine (PROZAC) 20 MG capsule Take 60 mg by mouth daily.  . furosemide (LASIX) 20 MG tablet Take 1 tablet (20 mg total) by mouth daily as needed for edema.  . hydrOXYzine (ATARAX/VISTARIL) 25 MG tablet Take 25 mg by mouth every 6 (six) hours as needed.  Marland Kitchen losartan (COZAAR) 25 MG tablet Take 25  mg by mouth daily.  Marland Kitchen OVER THE COUNTER MEDICATION Take 1 capsule by mouth daily as needed. Diphenhydramate Over The Counter Anti-emetic  . pramipexole (MIRAPEX) 0.125 MG tablet One tablet at dinnertime and 2 at bedtime  . pravastatin (PRAVACHOL) 10 MG tablet Take 10 mg by mouth daily.  Marland Kitchen topiramate (TOPAMAX) 25 MG tablet Take 1 tablet (25 mg total) by mouth See admin instructions. Take one in the morning and 2 in the evening  . traMADol (ULTRAM) 50 MG tablet Take 1 tablet (50 mg total) by mouth every 6 (six) hours as needed.  . Vitamin D, Ergocalciferol, (DRISDOL) 50000 units CAPS capsule Take 50,000 Units by mouth every 7 (seven) days. Take 1 tablet (50,000 units) by mouth every Thursday  Allergies:   Ramipril; Shrimp [shellfish allergy]; Minocycline hcl; Altace [ramipril]; Lyrica [pregabalin]; Other; Adhesive [tape]; Bactrim [sulfamethoxazole-trimethoprim]; Drixoral [brompheniramine-pseudoeph]; Erythromycin; Minocin [minocycline hcl]; Septra [sulfamethoxazole-trimethoprim]; and Sinutab [chlorphen-pseudoephed-apap]   Social History   Socioeconomic History  . Marital status: Married    Spouse name: Psychologist, clinical  . Number of children: 2  . Years of education: 49  . Highest education level: Not on file  Occupational History  . Occupation: physician office  Social Needs  . Financial resource strain: Not on file  . Food insecurity:    Worry: Not on file    Inability: Not on file  . Transportation needs:    Medical: Not on file    Non-medical: Not on file  Tobacco Use  . Smoking status: Never Smoker  . Smokeless tobacco: Never Used  Substance and Sexual Activity  . Alcohol use: Never    Frequency: Never    Comment: maybe one a year  . Drug use: Never  . Sexual activity: Yes    Birth control/protection: Surgical  Lifestyle  . Physical activity:    Days per week: Not on file    Minutes per session: Not on file  . Stress: Not on file  Relationships  . Social connections:    Talks on  phone: Not on file    Gets together: Not on file    Attends religious service: Not on file    Active member of club or organization: Not on file    Attends meetings of clubs or organizations: Not on file    Relationship status: Not on file  Other Topics Concern  . Not on file  Social History Narrative   ** Merged History Encounter **       Lives with husband Husband on disability Children grown and moved worries about family illness- mother and sister Caffeine use:       Family History:  The patient's family history includes Autoimmune disease in her mother; Breast cancer in her paternal aunt and sister; COPD in her maternal uncle; Colon cancer in her paternal uncle, paternal uncle, and paternal uncle; Eczema in her mother; Heart disease in her father and maternal aunt; Hypertension in her father and mother; Lung cancer in her paternal aunt and paternal grandfather; Lupus in her sister; Melanoma (age of onset: 39) in her father; Mental illness in her mother and son; Parkinson's disease in her mother; Parkinsonism in her paternal aunt; Polycystic ovary syndrome in her other; Psoriasis in her father; Skin cancer in her maternal uncle.   ROS:   Please see the history of present illness.    Review of Systems  Constitution: Positive for malaise/fatigue.  Cardiovascular: Positive for chest pain and dyspnea on exertion.  Musculoskeletal: Positive for myalgias.  Neurological: Positive for weakness.   All other systems reviewed and are negative.   PHYSICAL EXAM:   VS:  BP 136/78 (BP Location: Left Arm)   Pulse 82   Ht 5' 8.25" (1.734 m)   Wt 216 lb (98 kg)   SpO2 98%   BMI 32.60 kg/m   Physical Exam  GEN: Obese, in no acute distress  Neck: Right carotid bruit no JVD,  or masses Cardiac:RRR; no murmurs, rubs, or gallops  Respiratory:  clear to auscultation bilaterally, normal work of breathing GI: soft, nontender, nondistended, + BS Ext: without cyanosis, clubbing, or edema, Good  distal pulses bilaterally Neuro:  Alert and Oriented x 3 Psych: euthymic mood, full affect  Wt Readings from Last 3 Encounters:  09/08/18 216 lb (98 kg)  08/01/18 218 lb 14.7 oz (99.3 kg)  07/31/18 219 lb (99.3 kg)      Studies/Labs Reviewed:   EKG:  EKG is not ordered today.  The ekg reviewed from 08/04/2018 shows normal sinus rhythm no acute change Recent Labs: 05/25/2018: TSH 0.946 05/28/2018: Magnesium 1.9 08/01/2018: ALT 29; B Natriuretic Peptide 21.0; BUN 12; Creatinine, Ser 0.79; Hemoglobin 13.5; Platelets 285; Potassium 3.4; Sodium 140   Lipid Panel No results found for: CHOL, TRIG, HDL, CHOLHDL, VLDL, LDLCALC, LDLDIRECT  Additional studies/ records that were reviewed today include:  CT 07/31/2018  IMPRESSION: 1. Technically adequate exam showing no acute pulmonary embolus. 2. Enlarged pulmonary arteries consistent with pulmonary arterial hypertension. 3. Postoperative changes in the RIGHT breast consistent with RIGHT lumpectomy.     Electronically Signed   By: Nolon Nations M.D.   On: 07/31/2018 16:50   FINDINGS: Cardiovascular: The heart size is normal. Pulmonary arteries are well opacified by contrast bolus. There is no acute pulmonary embolus. The main pulmonary artery is 3.3 centimeters. Findings are consistent with pulmonary arterial hypertension.   2D echo 10/12/2019Study Conclusions   - Left ventricle: The cavity size was normal. Systolic function was   normal. The estimated ejection fraction was in the range of 55%   to 60%. Wall motion was normal; there were no regional wall   motion abnormalities. - Aortic valve: Transvalvular velocity was within the normal range.   There was no stenosis. There was no regurgitation. - Mitral valve: Transvalvular velocity was within the normal range.   There was no evidence for stenosis. There was no regurgitation. - Right ventricle: The cavity size was normal. Wall thickness was   normal. Systolic function was  normal. - Atrial septum: No defect or patent foramen ovale was identified   by color flow Doppler. - Tricuspid valve: There was no regurgitation.    Lexiscan 2016There was no ST segment deviation noted during stress after Lexiscan injection  The study is normal. There are no perfusion defects consistent with ischemia or prior infarct.  This is a low risk study.  Nuclear stress EF: 63%.     ASSESSMENT:    1. Chest pain, unspecified type   2. Essential hypertension   3. Pulmonary artery hypertension ([place])   4. Bruit of right carotid artery      PLAN:  In order of problems listed above:  Chest pain described as right-sided chest tightness with little activity.  Patient is very concerned about this.  Had normal Lexiscan in 2016.  We will do exercise Myoview.  Follow-up with Dr. Harl Bowie.  Pulmonary artery hypertension on CT but 2D echo 08/02/2018 shows Normal RV, normal LV function.  Discussed with Dr. Harl Bowie who reviewed and does not recommend any further work-up at this time.  Essential hypertension blood pressure better controlled on diltiazem and losartan  Right carotid bruit check carotid Dopplers  OSA on CPAP  Medication Adjustments/Labs and Tests Ordered: Current medicines are reviewed at length with the patient today.  Concerns regarding medicines are outlined above.  Medication changes, Labs and Tests ordered today are listed in the Patient Instructions below. Patient Instructions  Medication Instructions:  Your physician recommends that you continue on your current medications as directed. Please refer to the Current Medication list given to you today.  If you need a refill on your cardiac medications before your next appointment, please call your pharmacy.   Lab work: none If you have labs (blood work) drawn  today and your tests are completely normal, you will receive your results only by: Marland Kitchen MyChart Message (if you have MyChart) OR . A paper copy in the mail If  you have any lab test that is abnormal or we need to change your treatment, we will call you to review the results.  Testing/Procedures:Your physician has requested that you have a carotid duplex. This test is an ultrasound of the carotid arteries in your neck. It looks at blood flow through these arteries that supply the brain with blood. Allow one hour for this exam. There are no restrictions or special instructions.   Your physician has requested that you have en exercise stress myoview. For further information please visit [email or web address]. Please follow instruction sheet, as given.    Follow-Up: At [facility], you and your health needs are our priority.  As part of our continuing mission to provide you with exceptional heart care, we have created designated Provider Care Teams.  These Care Teams include your primary Cardiologist (physician) and Advanced Practice Providers (APPs -  Physician Assistants and Nurse Practitioners) who all work together to provide you with the care you need, when you need it. You will need a follow up appointment in 3 months.  Please call our office 2 months in advance to schedule this appointment.  You may see Carlyle Dolly, MD or one of the following Advanced Practice Providers on your designated Care Team:   Bernerd Pho, PA-C [facility]) . Ermalinda Barrios, PA-C ([place])  Any Other Special Instructions Will Be Listed Below (If Applicable). None      Signed, Ermalinda Barrios, PA-C  09/08/2018 1:10 PM    [facility] [address], [place], [place]  [address] Phone: [phone]; Fax: [phone]

## 2018-09-04 ENCOUNTER — Ambulatory Visit (HOSPITAL_COMMUNITY): Payer: BLUE CROSS/BLUE SHIELD

## 2018-09-04 ENCOUNTER — Encounter (HOSPITAL_COMMUNITY): Payer: Self-pay

## 2018-09-04 ENCOUNTER — Other Ambulatory Visit: Payer: Self-pay

## 2018-09-04 DIAGNOSIS — G8929 Other chronic pain: Secondary | ICD-10-CM

## 2018-09-04 DIAGNOSIS — M6281 Muscle weakness (generalized): Secondary | ICD-10-CM

## 2018-09-04 DIAGNOSIS — M5442 Lumbago with sciatica, left side: Secondary | ICD-10-CM | POA: Diagnosis not present

## 2018-09-04 DIAGNOSIS — M79622 Pain in left upper arm: Secondary | ICD-10-CM | POA: Diagnosis not present

## 2018-09-04 DIAGNOSIS — R29898 Other symptoms and signs involving the musculoskeletal system: Secondary | ICD-10-CM | POA: Diagnosis not present

## 2018-09-04 DIAGNOSIS — R262 Difficulty in walking, not elsewhere classified: Secondary | ICD-10-CM | POA: Diagnosis not present

## 2018-09-04 NOTE — Therapy (Signed)
[facility] [address] [place], [place], [address] Phone: [phone]   Fax:  [phone]  Physical Therapy Treatment/Progress Note  Patient Details  Name: Sarah Cooley MRN: [id number] Date of Birth: [birth year] Referring Provider (PT): Kathrynn Ducking, MD   Encounter Date: 09/04/2018   Progress Note Reporting Period 08/14/18 to 09/04/18  See note below for Objective Data and Assessment of Progress/Goals.    PT End of Session - 09/04/18 1005    Visit Number  6    Number of Visits  13    Date for PT Re-Evaluation  09/25/18   Minireassess 09/04/2018   Authorization Type  BCBS (30 visit limit PT/OT/[place] combined, 12 used at eval and pt in OT; no auth required)    Authorization Time Period  08/14/18 - 09/26/18    Authorization - Visit Number  6    Authorization - Number of Visits  9    PT Start Time  0948    PT Stop Time  1030    PT Time Calculation (min)  42 min    Activity Tolerance  Patient tolerated treatment well    Behavior During Therapy  WFL for tasks assessed/performed       Past Medical History:  Diagnosis Date  . Allergy    seasonal  . Anxiety   . Arthritis   . Balance problem   . Brain aneurysm    2 mm ACA region aneurysm by 09/21/15 MRA  . Breast cancer ([place])   . Breast cancer of lower-inner quadrant of right female breast ([place]) 08/26/2015  . Chronic kidney disease    acute renal failure one occasion  . Complication of anesthesia    heart rate drops and O2 sats drop  . Constipation   . Depression   . Diplopia 10/29/2017  . Dizziness 10/29/2017  . Elevated liver function tests   . Family history of adverse reaction to anesthesia    mom has n/v  . Family history of breast cancer   . Family history of colon cancer   . Fibromyalgia   . Fibromyalgia   . Gait abnormality 10/29/2017  . Genetic testing 09/08/2015   Negative genetic testing on the Breast/High Moderate Risk panel. The Breast High/Moderate Risk  gene panel offered by [facility] includes sequencing and deletion/duplication analysis of the following 9 genes: ATM, BRCA1, BRCA2, CDH1, CHEK2, PALB2, PTEN, STK11, and TP53. The report date is September 08, 2015.  The Rest of the Comprehensive Cancer panel has been reflexed and will be available in 2-3 weeks.  POLD1 c.208G>T VUS found on the Remainder of the Comprehensive cancer panel.  The Comprehensive Cancer Panel offered by [facility] includes sequencing and/or deletion duplication testing of the following 32 genes: APC, ATM, AXIN2, BARD1, BMPR1A, BRCA1, BRCA2, BRIP1, CDH1, CDK4, CDKN2A, CHEK2, EPCAM, FANCC, MLH1, MSH2, MSH6, MUTYH, NBN, PALB2, PMS2, POLD1, POLE, PTEN, RAD51C, RAD51D, SCG5/GREM1, SMAD4, STK11, TP53, VHL, and XRCC2.   The report date is 09/12/2015.   Marland Kitchen GERD (gastroesophageal reflux disease)    none recent  . H/O acute renal failure 09/22/15   admitted to [facility]  . Headache    occipital and temporal  . History of hiatal hernia   . Hyperlipemia   . Hyperlipidemia   . Hypertension   . Irritable bowel syndrome (IBS)   . MGUS (monoclonal gammopathy of unknown significance) 03/15/2016  . Neuromuscular disorder (HCC)    fibromyalgia  . Occasional tremors   . OSA (obstructive sleep  apnea)    uses cpap with humidification- auto   . Personal history of radiation therapy   . PONV (postoperative nausea and vomiting)   . Renal cell carcinoma ([place])   . Tremor, essential 10/29/2017    Past Surgical History:  Procedure Laterality Date  . ABDOMINAL HYSTERECTOMY     adenomyosis  . ABDOMINAL HYSTERECTOMY    . BLADDER SURGERY    . BONE GRAFT HIP ILIAC CREST    . BREAST BIOPSY Bilateral   . BREAST LUMPECTOMY    . BREAST SURGERY    . CHOLECYSTECTOMY    . FRACTURE SURGERY Right    wrist  . KNEE ARTHROSCOPY    . KNEE ARTHROSCOPY W/ ACL RECONSTRUCTION     right  . RADIOACTIVE SEED GUIDED PARTIAL MASTECTOMY WITH AXILLARY SENTINEL LYMPH NODE BIOPSY Right 10/13/2015   Procedure:  RADIOACTIVE SEED GUIDED PARTIAL MASTECTOMY WITH AXILLARY SENTINEL LYMPH NODE BIOPSY;  Surgeon: Erroll Luna, MD;  Location: [place];  Service: General;  Laterality: Right;  . right carpal and cubital tunnel release     . ROBOTIC ASSITED PARTIAL NEPHRECTOMY Left 06/19/2018   Procedure: XI ROBOTIC ASSITED PARTIAL NEPHRECTOMY;  Surgeon: Ardis Hughs, MD;  Location[facility];  Service: Urology;  Laterality: Left;  . WRIST SURGERY      There were no vitals filed for this visit.  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 She was positive for 5 different strands and is working on getting in at the infectious disease clinic to find and MD experienced with [place] who can manage it. She states now that she knows this it feels like things make sense as to all of her symptoms and how she has been going down hill since this started in 2015/2016. She is still having pain today but does not have radiating shooting pains down her leg anymore, she states her leg pain is more of a dull ache. She still cannot sleep through the night and reports that seems to be getting worse.    Pertinent History  06/19/18 Lt partial nephrectomy, breast cancer 2016, pulmonary HTN, fibromyalgia, sleep apnea    Limitations  Walking;Other (comment);Standing;Sitting;House hold activities    Patient Stated Goals  to have less pain and be able to sleep through the night    Currently in Pain?  Yes    Pain Score  6     Pain Location  Back    Pain Orientation  Lower    Pain Descriptors / Indicators  Aching    Pain Type  Chronic pain    Pain Onset  More than a month ago    Pain Frequency  Intermittent    Aggravating Factors   reaching and lifting    Pain Relieving Factors  medication, rest    Effect of Pain on Daily Activities  moderate effect        [facility] PT Assessment -  09/04/18 0001      Assessment   Medical Diagnosis  Low Back Pain    Referring Provider (PT)  Kathrynn Ducking, MD    Onset Date/Surgical Date  01/18/18    Hand Dominance  Right    Next MD Visit  N/A    Prior Therapy  None      Precautions   Precautions  Fall    Precaution Comments  1 fall in last 6 months, likely related to pulmonary aterial HTN      Restrictions   Weight Bearing Restrictions  No      Home Environment   Living Environment  Private residence    Living Arrangements  Spouse/significant other    Available Help at Discharge  Family    Type of [place] to enter    Entrance Stairs-Number of Steps  4    Entrance Stairs-Rails  Can reach both    [place]  Two level;Able to live on main level with bedroom/bathroom    [place] - 2 wheels;Cane - single point    Additional Comments  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 She reports recently her husband has been caring for both of her parents and providing them with transportation for doctor's appointments. She enjoys walking some with her husband. She is hoping to return part time to work on Monday.      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    Leisure  Enjoys walkin gwith her husband around neighborhood, cooking, and spending time with family      Cognition   Overall Cognitive Status  Within Functional Limits for tasks assessed      Observation/Other Assessments   Focus on Therapeutic Outcomes (FOTO)   69% limited      AROM   Lumbar Flexion  60   painful Lt Lower back   Lumbar Extension  20    Lumbar - Right Side Bend  13   pain on Lt   Lumbar - Left Side Bend  15   pain on Rt   Lumbar - Right Rotation  8    some discomfort   Lumbar - Left Rotation  8      Strength   Right Hip Flexion  4+/5   pain   Right Hip Extension  5/5    Right Hip ABduction  5/5    Left Hip Flexion  4+/5    Left Hip Extension  4+/5    Left Hip ABduction  4+/5    Right Knee Flexion  5/5    Right Knee Extension  5/5    Left Knee Flexion  4+/5    Left Knee Extension  5/5    Right Ankle Dorsiflexion  5/5    Left Ankle Dorsiflexion  5/5      Special Tests    Special Tests  Lumbar    Lumbar Tests  Slump Test      Slump test   Findings  Positive    Side  Left    Comment  pain and burning/numbness and tingling in Lt LE from buttock to foot       [facility] Adult PT Treatment/Exercise - 09/04/18 0001      Lumbar Exercises: Stretches   Lower Trunk Rotation  5 reps;10 seconds    Standing Extension  10 reps    Standing Extension Limitations  at counter, 10 sec holds    Prone on Elbows Stretch  10 seconds    Prone on Elbows Stretch Limitations  10 reps    Press Ups Limitations  10x  5 seconds   limited holds for Rt wrist comfort     Lumbar Exercises: Standing   Functional Squats  10 reps;3 seconds    Functional Squats Limitations  chair taps    Forward Lunge  15 reps    Forward Lunge Limitations  Bil LE's, 4" step      Manual Therapy   Manual Therapy  Joint mobilization    Manual therapy comments   Manual therapy completed prior to therapy exercises     Joint Mobilization  Grade II mobilization, PA's to L1-5, 3x 30-45 seconds        PT Education - 09/04/18 1004    Education Details  Educated on progress with goals and updated HEP.    Person(s) Educated  Patient    Methods  Explanation;Handout    Comprehension  Verbalized understanding;Returned demonstration       PT Short Term Goals - 09/04/18 1006      PT SHORT TERM GOAL #1   Title  Patient will be independent with HEP, updated PRN, to improve LE strength to improve posture and reduce LBP with mobility.    Time  2    Period  Weeks    Status   Achieved        PT Long Term Goals - 09/04/18 1006      PT LONG TERM GOAL #1   Title  Patient will improve MMT to 5/5 thorughout bil LE to demonstrate significant improvement in functional strength to improve activity tolerance and endurance.    Time  4    Period  Weeks    Status  On-going      PT LONG TERM GOAL #2   Title  Patient will improve lumbar ROM to WNL's for all planes and be pain free throughout to demosntrate improved mobility and position tolerance.    Time  4    Period  Weeks    Status  On-going      PT LONG TERM GOAL #3   Title  Patient will deny symptoms radiating beyond knees to demonstrate centralizaiton of radicular symptoms in bil LE's to indicate reduce sciatic nerve comrpession and reduce patient's pain.     Time  4    Period  Weeks    Status  Partially Met      PT LONG TERM GOAL #4   Title  Patient will reports being able to sleep through the night for 1 week straight without and increase in pain to improve QOL and overal health/wellness.    Time  4    Period  Weeks    Status  On-going        Plan - 09/04/18 1005    Clinical Impression Statement  Patient ROM and strength were reassessed today and she has made good progress with LE strength. Her ROM has not improved significantly however she is having less pain with active movements. She reports her pain is ongoing but has had less radicular symptoms and states her pain is isolated to her Rt lower back primarily. She continued today with press ups and extension postures to reduce pain and with functional LE strengthening and standing lumbar extension stretch was added to HEP. She will continued to benefit from skilled PT interventions to address impairments and progress towards remaining goals to improve QOL.     Rehab Potential  Good    PT Frequency  2x / week    PT Duration  6 weeks    PT Treatment/Interventions  ADLs/Self Care Home Management;Aquatic Therapy;Cryotherapy;Electrical Stimulation;Moist  Heat;Traction;DME Instruction;Gait training;Stair training;Functional mobility training;Therapeutic activities;Therapeutic exercise;Balance training;Neuromuscular re-education;Patient/family education;Manual techniques;Passive range of motion;Taping;Spinal Manipulations;Joint Manipulations    PT Next Visit Plan  Continue to progress core and hip strengthening. Advance balance challenges with tandem, SLS, and compliant surfaces. Continue extension postures/stretches for pain relief and manual PRN.    PT Home Exercise Plan  Eval: prone on elbows, piriformis stretch, LTR, bridge; 09/05/18 standing lumbar extension    Consulted and Agree with Plan of Care  Patient       Patient will benefit from skilled therapeutic intervention in order to improve the following deficits and impairments:  Decreased endurance, Decreased range of motion, Decreased strength, Impaired sensation, Pain, Postural dysfunction, Difficulty walking, Cardiopulmonary status limiting activity, Decreased balance, Decreased mobility, Decreased activity tolerance, Hypomobility, Obesity  Visit Diagnosis: Chronic bilateral low back pain with left-sided sciatica  Muscle weakness (generalized)  Difficulty in walking, not elsewhere classified     Problem List Patient Active Problem List   Diagnosis Date Noted  . Pulmonary artery hypertension ([place]) 09/03/2018  . Exertional chest pain 08/01/2018  . Family history of melanoma 07/31/2018  . Renal cancer, left ([place]) 07/31/2018  . Renal mass, left 06/19/2018  . Clostridium difficile diarrhea   . Weakness   . Hypokalemia, gastrointestinal losses 05/25/2018  . Hypokalemia 05/24/2018  . Dizziness 10/29/2017  . Gait abnormality 10/29/2017  . Tremor, essential 10/29/2017  . Diplopia 10/29/2017  . Obstructive sleep apnea on CPAP 12/10/2016  . Plantar fasciitis, bilateral 12/10/2016  . Fibromyalgia 11/09/2016  . IBS (irritable bowel syndrome) 11/09/2016  . GERD (gastroesophageal  reflux disease) 11/09/2016  . Anxiety and depression 11/09/2016  . MGUS (monoclonal gammopathy of unknown significance) 03/15/2016  . Family history of breast cancer   . Family history of colon cancer   . Breast cancer of lower-inner quadrant of right female breast ([place]) 08/26/2015  . Headache 07/17/2015  . Hypertension 07/17/2015  . Obesity (BMI 35.0-39.9 without comorbidity) 07/17/2015    Kipp Brood, PT, DPT Physical Therapist with [facility]  09/04/2018 10:37 AM    [place], [place], [address] Phone: [phone]   Fax:  [phone]  Name: Sarah Cooley MRN: [id number] Date of Birth: [birth year]

## 2018-09-04 NOTE — Therapy (Signed)
[name], [place], [address] Phone: [phone]   Fax:  [phone]  Occupational Therapy Treatment  Patient Details  Name: Sarah Cooley MRN: [id number] Date of Birth: [birth year] Referring Provider (OT): Marchia Bond, MD    Encounter Date: 09/04/2018  OT End of Session - 09/04/18 1113    Visit Number  5    Number of Visits  8    Date for OT Re-Evaluation  09/11/18    Authorization Type  1) [employer]BS Calendar year plan (PT/OT/chiro 12 out of 30 used with 18 remaining)     Authorization - Visit Number  5    Authorization - Number of Visits  8    OT Start Time  8563    OT Stop Time  1115    OT Time Calculation (min)  42 min    Activity Tolerance  Patient tolerated treatment well    Behavior During Therapy  [facility] for tasks assessed/performed       Past Medical History:  Diagnosis Date  . Allergy    seasonal  . Anxiety   . Arthritis   . Balance problem   . Brain aneurysm    2 mm ACA region aneurysm by 09/21/15 MRA  . Breast cancer ([place])   . Breast cancer of lower-inner quadrant of right female breast ([place]) 08/26/2015  . Chronic kidney disease    acute renal failure one occasion  . Complication of anesthesia    heart rate drops and O2 sats drop  . Constipation   . Depression   . Diplopia 10/29/2017  . Dizziness 10/29/2017  . Elevated liver function tests   . Family history of adverse reaction to anesthesia    mom has n/v  . Family history of breast cancer   . Family history of colon cancer   . Fibromyalgia   . Fibromyalgia   . Gait abnormality 10/29/2017  . Genetic testing 09/08/2015   Negative genetic testing on the Breast/High Moderate Risk panel. The Breast High/Moderate Risk gene panel offered by [facility] includes sequencing and deletion/duplication analysis of the following 9 genes: ATM, BRCA1, BRCA2, CDH1, CHEK2, PALB2, PTEN, STK11, and TP53. The report date is September 08, 2015.  The Rest of the  Comprehensive Cancer panel has been reflexed and will be available in 2-3 weeks.  POLD1 c.208G>T VUS found on the Remainder of the Comprehensive cancer panel.  The Comprehensive Cancer Panel offered by [facility] includes sequencing and/or deletion duplication testing of the following 32 genes: APC, ATM, AXIN2, BARD1, BMPR1A, BRCA1, BRCA2, BRIP1, CDH1, CDK4, CDKN2A, CHEK2, EPCAM, FANCC, MLH1, MSH2, MSH6, MUTYH, NBN, PALB2, PMS2, POLD1, POLE, PTEN, RAD51C, RAD51D, SCG5/GREM1, SMAD4, STK11, TP53, VHL, and XRCC2.   The report date is 09/12/2015.   Marland Kitchen GERD (gastroesophageal reflux disease)    none recent  . H/O acute renal failure 09/22/15   ad[facility] to [facility]  . Headache    occipital and temporal  . History of hiatal hernia   . Hyperlipemia   . Hyperlipidemia   . Hypertension   . Irritable bowel syndrome (IBS)   . MGUS (monoclonal gammopathy of unknown significance) 03/15/2016  . Neuromuscular disorder (HCC)    fibromyalgia  . Occasional tremors   . OSA (obstructive sleep apnea)    uses cpap with humidification- auto   . Personal history of radiation therapy   . PONV (postoperative nausea and vomiting)   . Renal cell carcinoma ([place])   . Tremor, essential 10/29/2017  Past Surgical History:  Procedure Laterality Date  . ABDOMINAL HYSTERECTOMY     adenomyosis  . ABDOMINAL HYSTERECTOMY    . BLADDER SURGERY    . BONE GRAFT HIP ILIAC CREST    . BREAST BIOPSY Bilateral   . BREAST LUMPECTOMY    . BREAST SURGERY    . CHOLECYSTECTOMY    . FRACTURE SURGERY Right    wrist  . KNEE ARTHROSCOPY    . KNEE ARTHROSCOPY W/ ACL RECONSTRUCTION     right  . RADIOACTIVE SEED GUIDED PARTIAL MASTECTOMY WITH AXILLARY SENTINEL LYMPH NODE BIOPSY Right 10/13/2015   Procedure: RADIOACTIVE SEED GUIDED PARTIAL MASTECTOMY WITH AXILLARY SENTINEL LYMPH NODE BIOPSY;  Surgeon: Erroll Luna, MD;  Location: [place];  Service: General;  Laterality: Right;  . right carpal and cubital tunnel release     . ROBOTIC  ASSITED PARTIAL NEPHRECTOMY Left 06/19/2018   Procedure: XI ROBOTIC ASSITED PARTIAL NEPHRECTOMY;  Surgeon: Ardis Hughs, MD;  [facility];  Service: Urology;  Laterality: Left;  . WRIST SURGERY      There were no vitals filed for this visit.  Subjective Assessment - 09/04/18 1035    Subjective   S: I just found out that I have Lyme Disease and it's been undiagnosed for years.     Currently in Pain?  Yes    Pain Score  6     Pain Location  Shoulder    Pain Orientation  Left    Pain Descriptors / Indicators  Aching;Sore;Constant    Pain Type  Chronic pain    Pain Radiating Towards  N/A    Pain Onset  Yesterday   last night once she got in to bed   Pain Frequency  Constant    Aggravating Factors   Nothing right now    Pain Relieving Factors  nothing is helping right now.     Effect of Pain on Daily Activities  moderate effect    Multiple Pain Sites  No         [facility] OT Assessment - 09/04/18 1052      Assessment   Medical Diagnosis  Left arm pain    Referring Provider (OT)  Marchia Bond, MD       Precautions   Precautions  Fall               OT Treatments/Exercises (OP) - 09/04/18 1052      Exercises   Exercises  Shoulder      Shoulder Exercises: Prone   Other Prone Exercises  [place] exercies: 1#; 10X; H1, H2 (1 rest break)      Shoulder Exercises: Sidelying   External Rotation  Strengthening;10 reps    External Rotation Weight (lbs)  1    Internal Rotation  Strengthening;10 reps    Internal Rotation Weight (lbs)  1    Flexion  Strengthening;10 reps    Flexion Weight (lbs)  1    ABduction  Strengthening;10 reps    ABduction Weight (lbs)  1    Other Sidelying Exercises  Protraction; 1#; 10X    Other Sidelying Exercises  Horizontal abduction; 1#; 10X      Shoulder Exercises: Therapy Ball   Other Therapy Ball Exercises  green ball: chest press, overhead press, flexion, circles both directions, 10X each      Shoulder Exercises:  ROM/Strengthening   UBE (Upper Arm Bike)  Level 2' 3' reverse only   pace: 4.0-4.5   X to V Arms  12X, 1#  Proximal Shoulder Strengthening, Seated  12X with 1#, no rest breaks      Manual Therapy   Manual Therapy  Myofascial release    Manual therapy comments   Manual therapy completed prior to therapy exercises     Myofascial Release  Myofascial release to left upper arm, deltoid, and trapezius regions to decrease pain and fascial restrictions and increase joint range of motion                OT Short Term Goals - 08/20/18 1837      OT SHORT TERM GOAL #1   Title  Pt will be educated on and independent in HEP for improving functional use of LUE during ADLs.      Time  4    Period  Weeks    Status  On-going      OT SHORT TERM GOAL #2   Title  Pt will improve LUE A/ROM to WNL to improve ability to perform dressing tasks.      Time  4    Period  Weeks    Status  On-going      OT SHORT TERM GOAL #3   Title  Pt will decrease pain to 3/10 or less to improve ability to perform functional reaching activities while showering.      Time  4    Period  Weeks    Status  On-going      OT SHORT TERM GOAL #4   Title   Pt will improve strength to 5/5 to improve ability to complete household chores.      Time  4    Period  Weeks    Status  On-going      OT SHORT TERM GOAL #5   Title  Pt will decrease fascial restriction to trace amounts in order to be able to reach her high kitchen cabinets while cooking.     Time  4    Period  Weeks    Status  On-going               Plan - 09/04/18 1114    Clinical Impression Statement  A: Pt completed scapular strengthening while sidelying and prone during session. Sidelying with 1# appeared to be very easy although prone strengthening was more challenging. Added UBE bike to continue focusing scapular strengthening. VC for form and technique.     Plan  P: Continue with manual therapy PRN. Completed Reassessment.     Consulted and  Agree with Plan of Care  Patient       Patient will benefit from skilled therapeutic intervention in order to improve the following deficits and impairments:  Decreased activity tolerance, Decreased strength, Impaired flexibility, Decreased range of motion, Pain, Increased fascial restrictions, Impaired UE functional use  Visit Diagnosis: Pain in left upper arm  Other symptoms and signs involving the musculoskeletal system    Problem List Patient Active Problem List   Diagnosis Date Noted  . Pulmonary artery hypertension ([place]) 09/03/2018  . Exertional chest pain 08/01/2018  . Family history of melanoma 07/31/2018  . Renal cancer, left ([place]) 07/31/2018  . Renal mass, left 06/19/2018  . Clostridium difficile diarrhea   . Weakness   . Hypokalemia, gastrointestinal losses 05/25/2018  . Hypokalemia 05/24/2018  . Dizziness 10/29/2017  . Gait abnormality 10/29/2017  . Tremor, essential 10/29/2017  . Diplopia 10/29/2017  . Obstructive sleep apnea on CPAP 12/10/2016  . Plantar fasciitis, bilateral 12/10/2016  . Fibromyalgia 11/09/2016  . IBS (  irritable bowel syndrome) 11/09/2016  . GERD (gastroesophageal reflux disease) 11/09/2016  . Anxiety and depression 11/09/2016  . MGUS (monoclonal gammopathy of unknown significance) 03/15/2016  . Family history of breast cancer   . Family history of colon cancer   . Breast cancer of lower-inner quadrant of right female breast ([place]) 08/26/2015  . Headache 07/17/2015  . Hypertension 07/17/2015  . Obesity (BMI 35.0-39.9 without comorbidity) 07/17/2015   Ailene Ravel, OTR/L,CBIS  [phone]  09/04/2018, 11:17 AM  [facility] [address] [place], [place], [address] Phone: [phone]   Fax:  [phone]  Name: Sarah Cooley MRN: [id number] Date of Birth: [birth year]

## 2018-09-04 NOTE — Patient Instructions (Signed)
Standing Lumbar Extension with Counter reps: 10 sets: 1-2 hold: 5-10 seconds daily: 1  weekly: 7      Exercise image step 1   Exercise image step 2 Setup  Begin in a standing upright position in front of a counter, with your hands resting on your hips. Movement  Slowly arch your trunk backwards and hold. Tip  Make sure to use the counter for balance as needed and do not bend your knees during the exercise.

## 2018-09-05 ENCOUNTER — Telehealth: Payer: Self-pay | Admitting: Neurology

## 2018-09-05 NOTE — Telephone Encounter (Signed)
Pt has called to inform Dr Jannifer Franklin that she has met with her PCP, she had the test done for Lyme disease and it came back positive.  Pt is asking for a call to discuss further with Dr Jannifer Franklin

## 2018-09-05 NOTE — Telephone Encounter (Signed)
I called the patient.  The patient apparently has had a recent Lyme test that was positive with 5 or more IgG bands.  The Lyme antibody panel was tested in January 2019, this was negative.  The patient likely has had a tick exposure between January 2019 and the present date.  This therefore would indicate that the Lyme disease has nothing to do with her chronic symptoms that have been present for several years.  The patient may be seen infectious disease for evaluation of the Lyme disease.

## 2018-09-08 ENCOUNTER — Ambulatory Visit: Payer: Self-pay | Admitting: Genetic Counselor

## 2018-09-08 ENCOUNTER — Encounter

## 2018-09-08 ENCOUNTER — Ambulatory Visit: Payer: BLUE CROSS/BLUE SHIELD | Admitting: Physician Assistant

## 2018-09-08 ENCOUNTER — Encounter: Payer: Self-pay | Admitting: Genetic Counselor

## 2018-09-08 ENCOUNTER — Encounter: Payer: Self-pay | Admitting: Physician Assistant

## 2018-09-08 VITALS — BP 136/78 | HR 82 | Ht 68.25 in | Wt 216.0 lb

## 2018-09-08 DIAGNOSIS — R079 Chest pain, unspecified: Secondary | ICD-10-CM | POA: Diagnosis not present

## 2018-09-08 DIAGNOSIS — R0989 Other specified symptoms and signs involving the circulatory and respiratory systems: Secondary | ICD-10-CM

## 2018-09-08 DIAGNOSIS — I2721 Secondary pulmonary arterial hypertension: Secondary | ICD-10-CM | POA: Diagnosis not present

## 2018-09-08 DIAGNOSIS — I10 Essential (primary) hypertension: Secondary | ICD-10-CM

## 2018-09-08 DIAGNOSIS — Z9989 Dependence on other enabling machines and devices: Secondary | ICD-10-CM

## 2018-09-08 DIAGNOSIS — G4733 Obstructive sleep apnea (adult) (pediatric): Secondary | ICD-10-CM

## 2018-09-08 DIAGNOSIS — Z1379 Encounter for other screening for genetic and chromosomal anomalies: Secondary | ICD-10-CM

## 2018-09-08 NOTE — Patient Instructions (Signed)
Medication Instructions:  Your physician recommends that you continue on your current medications as directed. Please refer to the Current Medication list given to you today.  If you need a refill on your cardiac medications before your next appointment, please call your pharmacy.   Lab work: none If you have labs (blood work) drawn today and your tests are completely normal, you will receive your results only by: Marland Kitchen MyChart Message (if you have MyChart) OR . A paper copy in the mail If you have any lab test that is abnormal or we need to change your treatment, we will call you to review the results.  Testing/Procedures:Your physician has requested that you have a carotid duplex. This test is an ultrasound of the carotid arteries in your neck. It looks at blood flow through these arteries that supply the brain with blood. Allow one hour for this exam. There are no restrictions or special instructions.   Your physician has requested that you have en exercise stress myoview. For further information please visit [email or web address]. Please follow instruction sheet, as given.    Follow-Up: At [facility], you and your health needs are our priority.  As part of our continuing mission to provide you with exceptional heart care, we have created designated Provider Care Teams.  These Care Teams include your primary Cardiologist (physician) and Advanced Practice Providers (APPs -  Physician Assistants and Nurse Practitioners) who all work together to provide you with the care you need, when you need it. You will need a follow up appointment in 3 months.  Please call our office 2 months in advance to schedule this appointment.  You may see Carlyle Dolly, MD or one of the following Advanced Practice Providers on your designated Care Team:   Bernerd Pho, PA-C [facility]) . Ermalinda Barrios, PA-C ([place])  Any Other Special Instructions Will Be Listed Below (If  Applicable). None

## 2018-09-08 NOTE — Progress Notes (Signed)
HPI:  [name] was previously seen in the [facility] due to a personal and family history of cancer and concerns regarding a hereditary predisposition to cancer. Please refer to our prior cancer genetics clinic note for more information regarding Sarah Cooley's medical, social and family histories, and our assessment and recommendations, at the time. Sarah Cooley recent genetic test results were disclosed to her, as were recommendations warranted by these results. These results and recommendations are discussed in more detail below.  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     08/23/2015 Initial Biopsy    AT LEAST HIGH DUCTAL CARCINOMA IN SITU    08/23/2015 Receptors her2    Estrogen Receptor: 0%, NEGATIVE Progesterone Receptor: 0%, NEGATIVE    08/26/2015 Initial Diagnosis    Breast cancer of lower-inner quadrant of right female breast ([place])    08/31/2015 Procedure    GeneDx negative.  Genes tested include: ATM, BRCA1, BRCA2, CDH1, CHEK2, PALB2, PTEN, & TP53.     10/13/2015 Pathologic Stage    pTis, pN0: Stage 0     10/13/2015 Surgery    Right lumpectomy & SLNB ([name]). High grade DCIS, 0.4 cm. Negative margins.  1 right axillary SLN neg. ER/PR repeated and both remain negative.     11/17/2015 - 12/21/2015 Radiation Therapy    Treated at [id number] [place]). Right breast: Total dose 50 Gy in 25 fractions. 3-D tangents; 6 MV photons.     08/22/2018 Genetic Testing    Negative genetic testing on the CancerNExt Expanded+RNA insight panel.  The CancerNext-Expanded gene panel offered by [facility] and includes sequencing and rearrangement analysis  for the following 67 genes: AIP, ALK, APC*, ATM*, BAP1, BARD1, BLM, BMPR1A, BRCA1*, BRCA2*, BRIP1*, CDH1*, CDK4, CDKN1B, CDKN2A, CHEK2*, DICER1, FANCC, FH, FLCN, GALNT12, HOXB13, MAX, MEN1, MET, MLH1*, MRE11A, MSH2*, MSH6*, MUTYH*, NBN, NF1*, NF2, PALB2*, PHOX2B, PMS2*, POLD1, POLE, POT1, PRKAR1A, PTCH1, PTEN*, RAD50, RAD51C*, RAD51D*, RB1, RET, SDHA, SDHAF2, SDHB, SDHC, SDHD, SMAD4, SMARCA4, SMARCB1, SMARCE1, STK11, SUFU, TMEM127, TP53*, TSC1, TSC2, VHL and XRCC2 (sequencing and deletion/duplication); MITF (sequencing only); EPCAM and GREM1 (deletion/duplication only). DNA and RNA analyses performed for * genes. The report date is 08/22/2018.     Renal cancer, left ([place])   07/31/2018 Initial Diagnosis    Renal cancer, left ([place])    08/22/2018 Genetic Testing    Negative genetic testing on the CancerNExt Expanded+RNA insight panel.  The CancerNext-Expanded gene panel offered by [facility] and includes sequencing and rearrangement analysis for the following 67 genes: AIP, ALK, APC*, ATM*, BAP1, BARD1, BLM, BMPR1A, BRCA1*, BRCA2*, BRIP1*, CDH1*, CDK4, CDKN1B, CDKN2A, CHEK2*, DICER1, FANCC, FH, FLCN, GALNT12, HOXB13, MAX, MEN1, MET, MLH1*, MRE11A, MSH2*, MSH6*, MUTYH*, NBN, NF1*, NF2, PALB2*, PHOX2B, PMS2*, POLD1, POLE, POT1, PRKAR1A, PTCH1, PTEN*, RAD50, RAD51C*, RAD51D*, RB1, RET, SDHA, SDHAF2, SDHB, SDHC, SDHD, SMAD4, SMARCA4, SMARCB1, SMARCE1, STK11, SUFU, TMEM127, TP53*, TSC1, TSC2, VHL and XRCC2 (sequencing and deletion/duplication); MITF (sequencing only); EPCAM and GREM1 (deletion/duplication only). DNA and RNA analyses performed for * genes. The report date is 08/22/2018.     FAMILY HISTORY:  We obtained a detailed, 4-generation family history.  Significant diagnoses are listed below: Family History  Problem Relation Age of Onset  . Hypertension Mother   . Autoimmune disease Mother        lichen planus  . Eczema Mother   . Parkinson's disease Mother   . Mental illness Mother         bipolar  . Heart disease Maternal Aunt   . Lung cancer Paternal Aunt   . Breast cancer Paternal Aunt        dx in his 65s  . Parkinsonism Paternal Aunt   . Lung cancer Paternal Grandfather   . Colon cancer Paternal Uncle        dx in his 29s  . Colon cancer Paternal Uncle        dx in his 59s  . Melanoma Father 18  . Psoriasis Father   . Hypertension Father   . Heart disease Father   . Breast cancer Sister        dx in her 89s  . Lupus Sister   . COPD Maternal Uncle   . Skin cancer Maternal Uncle   . Colon cancer Paternal Uncle        dx in his 63s  . Mental illness Son        bipolar  . Polycystic ovary syndrome Other     The patient has two children who are cancer free. She has one brother and one sister. Her sister was diagnosed with breast cancer in her late 40's. Both parents are living.  The patient's father was diagnosed with melanoma in his late 85's. He had 10 siblings. Three brothers had colon cancer, a sister had breast cancer and a brother had lung cancer. His father either had colon or lung cancer.   The patient's mother was diagnosed with Parkinson's disease.  She has a brother and two sisters.  One sister died of a brain aneurysm.  Both maternal grandparents are deceased from non cancer related issues.  Sarah Cooley is unaware of previous family history of genetic testing for hereditary cancer risks. Patient's maternal ancestors are of Caucasian NOS descent, and paternal ancestors are of Caucasian NOS descent. There is no reported Ashkenazi Jewish ancestry. There is no known consanguinity.  GENETIC TEST RESULTS: Genetic testing reported out on August 22, 2018 through the CancerNext-Expanded-RNAInsight cancer panel found no deleterious mutations.  The CancerNext-Expanded gene panel offered by [facility] and includes sequencing and rearrangement analysis for the following 67 genes: AIP, ALK, APC*, ATM*, BAP1, BARD1, BLM, BMPR1A, BRCA1*, BRCA2*, BRIP1*,  CDH1*, CDK4, CDKN1B, CDKN2A, CHEK2*, DICER1, FANCC, FH, FLCN, GALNT12, HOXB13, MAX, MEN1, MET, MLH1*, MRE11A, MSH2*, MSH6*, MUTYH*, NBN, NF1*, NF2, PALB2*, PHOX2B, PMS2*, POLD1, POLE, POT1, PRKAR1A, PTCH1, PTEN*, RAD50, RAD51C*, RAD51D*, RB1, RET, SDHA, SDHAF2, SDHB, SDHC, SDHD, SMAD4, SMARCA4, SMARCB1, SMARCE1, STK11, SUFU, TMEM127, TP53*, TSC1, TSC2, VHL and XRCC2 (sequencing and deletion/duplication); MITF (sequencing only); EPCAM and GREM1 (deletion/duplication only). DNA and RNA analyses performed for * genes.   The test report has been scanned into EPIC and is located under the Molecular Pathology section of the Results Review tab.    We discussed with Sarah Cooley that since the current genetic testing is not perfect, it is possible there may be a gene mutation in one of these genes that current testing cannot detect, but that chance is small.  We also discussed, that it is possible that another gene that has not yet been discovered, or that we have not yet tested, is responsible for the cancer diagnoses in the family, and it is,  therefore, important to remain in touch with cancer genetics in the future so that we can continue to offer Sarah Cooley the most up to date genetic testing.    CANCER SCREENING RECOMMENDATIONS:  Given Sarah Cooley's personal and family histories, we must interpret these negative results with some caution.  Families with features suggestive of hereditary risk for cancer tend to have multiple family members with cancer, diagnoses in multiple generations and diagnoses before the age of 3. Sarah Cooley family exhibits some of these features. Thus this result may simply reflect our current inability to detect all mutations within these genes or there may be a different gene that has not yet been discovered or tested.   An individual's cancer risk and medical management are not determined by genetic test results alone. Overall cancer risk assessment incorporates additional factors,  including personal medical history, family history, and any available genetic information that may result in a personalized plan for cancer prevention and surveillance.  RECOMMENDATIONS FOR FAMILY MEMBERS:  Women in this family might be at some increased risk of developing cancer, over the general population risk, simply due to the family history of cancer.  We recommended women in this family have a yearly mammogram beginning at age 70, or 73 years younger than the earliest onset of cancer, an annual clinical breast exam, and perform monthly breast self-exams. Women in this family should also have a gynecological exam as recommended by their primary provider. All family members should have a colonoscopy by age 88.  FOLLOW-UP: Lastly, we discussed with Sarah Cooley that cancer genetics is a rapidly advancing field and it is possible that new genetic tests will be appropriate for her and/or her family members in the future. We encouraged her to remain in contact with cancer genetics on an annual basis so we can update her personal and family histories and let her know of advances in cancer genetics that may benefit this family.   Our contact number was provided. Sarah Cooley questions were answered to her satisfaction, and she knows she is welcome to call us at anytime with additional questions or concerns.   Roma Kayser, MS, [facility] Certified Genetic Counselor [email or web address]

## 2018-09-08 NOTE — Assessment & Plan Note (Signed)
Negative genetic testing on the CancerNExt Expanded+RNA insight panel.  The CancerNext-Expanded gene panel offered by [facility] and includes sequencing and rearrangement analysis for the following 67 genes: AIP, ALK, APC*, ATM*, BAP1, BARD1, BLM, BMPR1A, BRCA1*, BRCA2*, BRIP1*, CDH1*, CDK4, CDKN1B, CDKN2A, CHEK2*, DICER1, FANCC, FH, FLCN, GALNT12, HOXB13, MAX, MEN1, MET, MLH1*, MRE11A, MSH2*, MSH6*, MUTYH*, NBN, NF1*, NF2, PALB2*, PHOX2B, PMS2*, POLD1, POLE, POT1, PRKAR1A, PTCH1, PTEN*, RAD50, RAD51C*, RAD51D*, RB1, RET, SDHA, SDHAF2, SDHB, SDHC, SDHD, SMAD4, SMARCA4, SMARCB1, SMARCE1, STK11, SUFU, TMEM127, TP53*, TSC1, TSC2, VHL and XRCC2 (sequencing and deletion/duplication); MITF (sequencing only); EPCAM and GREM1 (deletion/duplication only). DNA and RNA analyses performed for * genes. The report date is 08/22/2018.

## 2018-09-09 ENCOUNTER — Ambulatory Visit (HOSPITAL_COMMUNITY): Payer: BLUE CROSS/BLUE SHIELD

## 2018-09-09 ENCOUNTER — Encounter (HOSPITAL_COMMUNITY): Payer: BLUE CROSS/BLUE SHIELD

## 2018-09-09 ENCOUNTER — Telehealth (HOSPITAL_COMMUNITY): Payer: Self-pay

## 2018-09-09 NOTE — Telephone Encounter (Signed)
She is in alot of pain today and really don't feel like coming in.

## 2018-09-10 ENCOUNTER — Telehealth: Payer: Self-pay | Admitting: Neurology

## 2018-09-10 ENCOUNTER — Other Ambulatory Visit: Payer: Self-pay | Admitting: Neurology

## 2018-09-10 NOTE — Telephone Encounter (Signed)
[place].  At her 07/23/18 ov, she reported Topamax was not helping h/a's. [place]

## 2018-09-10 NOTE — Telephone Encounter (Signed)
Pt called stating she is currently out of topiramate (TOPAMAX) 25 MG tablet needing a refill for [facility] in [place]. Please advise

## 2018-09-11 MED ORDER — TOPIRAMATE 25 MG PO TABS: 25.0000 mg | ORAL_TABLET | ORAL | 3 refills | Status: DC

## 2018-09-11 NOTE — Telephone Encounter (Signed)
Spoke with pt.  [facility] documentation indicates Topamax was not beneficial for pt's h/a's, but she now believes it is, never tapered off of it, and would like to continue it.  She is currently taking Topamax as rx'd--25mg  qam and 50mg  qhs. Will check with Dr. Jannifer Franklin and send in rx. if ok with him.  Pt. agreeable/fim

## 2018-09-11 NOTE — Telephone Encounter (Signed)
Pt returning RN's call.

## 2018-09-11 NOTE — Addendum Note (Signed)
Addended by: Kathrynn Ducking on: 09/11/2018 01:03 PM   Modules accepted: Orders

## 2018-09-11 NOTE — Telephone Encounter (Signed)
I called the patient.  States that she just wanted a refill on her Topamax, I will send a prescription on him.  She believes that the Topamax does offer some benefit with her headache.

## 2018-09-12 ENCOUNTER — Ambulatory Visit (HOSPITAL_COMMUNITY)
Admission: RE | Admit: 2018-09-12 | Discharge: 2018-09-12 | Disposition: A | Discharge status: Home / Self Care | Payer: BLUE CROSS/BLUE SHIELD | Source: Ambulatory Visit | Attending: Physician Assistant | Admitting: Physician Assistant

## 2018-09-12 ENCOUNTER — Encounter (HOSPITAL_COMMUNITY): Payer: Self-pay

## 2018-09-12 ENCOUNTER — Encounter (HOSPITAL_COMMUNITY)
Admission: RE | Admit: 2018-09-12 | Discharge: 2018-09-12 | Disposition: A | Discharge status: Home / Self Care | Payer: BLUE CROSS/BLUE SHIELD | Source: Ambulatory Visit | Attending: Physician Assistant | Admitting: Physician Assistant

## 2018-09-12 ENCOUNTER — Encounter (HOSPITAL_BASED_OUTPATIENT_CLINIC_OR_DEPARTMENT_OTHER)
Admission: RE | Admit: 2018-09-12 | Discharge: 2018-09-12 | Disposition: A | Discharge status: Home / Self Care | Payer: BLUE CROSS/BLUE SHIELD | Source: Ambulatory Visit | Attending: Physician Assistant | Admitting: Physician Assistant

## 2018-09-12 DIAGNOSIS — R079 Chest pain, unspecified: Secondary | ICD-10-CM | POA: Insufficient documentation

## 2018-09-12 DIAGNOSIS — R0989 Other specified symptoms and signs involving the circulatory and respiratory systems: Secondary | ICD-10-CM | POA: Diagnosis not present

## 2018-09-12 LAB — NM MYOCAR MULTI W/SPECT W/WALL MOTION / EF
CHL CUP NUCLEAR SDS: 4
CHL CUP RESTING HR STRESS: 73 {beats}/min
CSEPPHR: 157 {beats}/min
Estimated workload: 6.3 METS
Exercise duration (min): 4 min
Exercise duration (sec): 30 s
LHR: 0.43
LV sys vol: 25 mL
LVDIAVOL: 65 mL (ref 46–106)
MPHR: 168 {beats}/min
Percent HR: 93 %
RPE: 17
SRS: 0
SSS: 4
TID: 0.83

## 2018-09-12 MED ORDER — SODIUM CHLORIDE 0.9% FLUSH
INTRAVENOUS | Status: AC
  Administered 2018-09-12: 10 mL via INTRAVENOUS
  Filled 2018-09-12: qty 10

## 2018-09-12 MED ORDER — TECHNETIUM TC 99M TETROFOSMIN IV KIT
10.0000 | PACK | Freq: Once | INTRAVENOUS | Status: AC | PRN
  Administered 2018-09-12: 10 via INTRAVENOUS

## 2018-09-12 MED ORDER — TECHNETIUM TC 99M TETROFOSMIN IV KIT
30.0000 | PACK | Freq: Once | INTRAVENOUS | Status: AC | PRN
  Administered 2018-09-12: 28.6 via INTRAVENOUS

## 2018-09-12 MED ORDER — REGADENOSON 0.4 MG/5ML IV SOLN
INTRAVENOUS | Status: AC
  Filled 2018-09-12: qty 5

## 2018-09-16 ENCOUNTER — Ambulatory Visit (HOSPITAL_COMMUNITY): Payer: BLUE CROSS/BLUE SHIELD | Admitting: Physical Therapy

## 2018-09-16 ENCOUNTER — Ambulatory Visit (HOSPITAL_COMMUNITY): Payer: BLUE CROSS/BLUE SHIELD

## 2018-09-16 ENCOUNTER — Encounter (HOSPITAL_COMMUNITY): Payer: Self-pay | Admitting: Physical Therapy

## 2018-09-16 ENCOUNTER — Encounter (HOSPITAL_COMMUNITY): Payer: Self-pay

## 2018-09-16 ENCOUNTER — Encounter (HOSPITAL_COMMUNITY): Payer: BLUE CROSS/BLUE SHIELD

## 2018-09-16 DIAGNOSIS — G8929 Other chronic pain: Secondary | ICD-10-CM | POA: Diagnosis not present

## 2018-09-16 DIAGNOSIS — R29898 Other symptoms and signs involving the musculoskeletal system: Secondary | ICD-10-CM | POA: Diagnosis not present

## 2018-09-16 DIAGNOSIS — M5442 Lumbago with sciatica, left side: Principal | ICD-10-CM

## 2018-09-16 DIAGNOSIS — M79622 Pain in left upper arm: Secondary | ICD-10-CM | POA: Diagnosis not present

## 2018-09-16 DIAGNOSIS — M6281 Muscle weakness (generalized): Secondary | ICD-10-CM

## 2018-09-16 DIAGNOSIS — R262 Difficulty in walking, not elsewhere classified: Secondary | ICD-10-CM

## 2018-09-16 NOTE — Therapy (Signed)
[place], [place], [address] Phone: [phone]   Fax:  [phone]  Occupational Therapy Treatment reassessent and discharge Patient Details  Name: Sarah Cooley MRN: [id number] Date of Birth: [birth year] Referring Provider (OT): Marchia Bond, MD    Encounter Date: 09/16/2018  OT End of Session - 09/16/18 1550    Visit Number  6    Number of Visits  8    Authorization Type  1) [employer] Calendar year plan (PT/OT/chiro 12 out of 30 used with 18 remaining)     Authorization - Visit Number  6    Authorization - Number of Visits  8    OT Start Time  6962   reassess and discharge   OT Stop Time  1600    OT Time Calculation (min)  42 min    Activity Tolerance  Patient tolerated treatment well    Behavior During Therapy  [facility] for tasks assessed/performed       Past Medical History:  Diagnosis Date  . Allergy    seasonal  . Anxiety   . Arthritis   . Balance problem   . Brain aneurysm    2 mm ACA region aneurysm by 09/21/15 MRA  . Breast cancer ([place])   . Breast cancer of lower-inner quadrant of right female breast ([place]) 08/26/2015  . Chronic kidney disease    acute renal failure one occasion  . Complication of anesthesia    heart rate drops and O2 sats drop  . Constipation   . Depression   . Diplopia 10/29/2017  . Dizziness 10/29/2017  . Elevated liver function tests   . Family history of adverse reaction to anesthesia    mom has n/v  . Family history of breast cancer   . Family history of colon cancer   . Fibromyalgia   . Fibromyalgia   . Gait abnormality 10/29/2017  . Genetic testing 09/08/2015   Negative genetic testing on the Breast/High Moderate Risk panel. The Breast High/Moderate Risk gene panel offered by [facility] includes sequencing and deletion/duplication analysis of the following 9 genes: ATM, BRCA1, BRCA2, CDH1, CHEK2, PALB2, PTEN, STK11, and TP53. The report date is September 08, 2015.  The Rest of  the Comprehensive Cancer panel has been reflexed and will be available in 2-3 weeks.  POLD1 c.208G>T VUS found on the Remainder of the Comprehensive cancer panel.  The Comprehensive Cancer Panel offered by [facility] includes sequencing and/or deletion duplication testing of the following 32 genes: APC, ATM, AXIN2, BARD1, BMPR1A, BRCA1, BRCA2, BRIP1, CDH1, CDK4, CDKN2A, CHEK2, EPCAM, FANCC, MLH1, MSH2, MSH6, MUTYH, NBN, PALB2, PMS2, POLD1, POLE, PTEN, RAD51C, RAD51D, SCG5/GREM1, SMAD4, STK11, TP53, VHL, and XRCC2.   The report date is 09/12/2015.   Marland Kitchen GERD (gastroesophageal reflux disease)    none recent  . H/O acute renal failure 09/22/15   admitted to [facility]  . Headache    occipital and temporal  . History of hiatal hernia   . Hyperlipemia   . Hyperlipidemia   . Hypertension   . Irritable bowel syndrome (IBS)   . MGUS (monoclonal gammopathy of unknown significance) 03/15/2016  . Neuromuscular disorder (HCC)    fibromyalgia  . Occasional tremors   . OSA (obstructive sleep apnea)    uses cpap with humidification- auto   . Personal history of radiation therapy   . PONV (postoperative nausea and vomiting)   . Renal cell carcinoma ([place])   . Tremor, essential 10/29/2017  Past Surgical History:  Procedure Laterality Date  . ABDOMINAL HYSTERECTOMY     adenomyosis  . ABDOMINAL HYSTERECTOMY    . BLADDER SURGERY    . BONE GRAFT HIP ILIAC CREST    . BREAST BIOPSY Bilateral   . BREAST LUMPECTOMY    . BREAST SURGERY    . CHOLECYSTECTOMY    . FRACTURE SURGERY Right    wrist  . KNEE ARTHROSCOPY    . KNEE ARTHROSCOPY W/ ACL RECONSTRUCTION     right  . RADIOACTIVE SEED GUIDED PARTIAL MASTECTOMY WITH AXILLARY SENTINEL LYMPH NODE BIOPSY Right 10/13/2015   Procedure: RADIOACTIVE SEED GUIDED PARTIAL MASTECTOMY WITH AXILLARY SENTINEL LYMPH NODE BIOPSY;  Surgeon: Erroll Luna, MD;  Location: [facility];  Service: General;  Laterality: Right;  . right carpal and cubital tunnel release     .  ROBOTIC ASSITED PARTIAL NEPHRECTOMY Left 06/19/2018   Procedure: XI ROBOTIC ASSITED PARTIAL NEPHRECTOMY;  Surgeon: Ardis Hughs, MD;  Location[facility];  Service: Urology;  Laterality: Left;  . WRIST SURGERY      There were no vitals filed for this visit.  Subjective Assessment - 09/16/18 1519    Subjective   S: I'm not having any difficulty with my left arm.     Special Tests  FOTO score: 80/100    Currently in Pain?  No/denies         [facility] OT Assessment - 09/16/18 1519      Assessment   Medical Diagnosis  Left arm pain    Referring Provider (OT)  Marchia Bond, MD       Precautions   Precautions  Fall      Prior Function   Level of Independence  Independent      Palpation   Palpation comment  Trace fascial restrictions in left upper arm, trapezius, and scapularis region      AROM   Overall AROM Comments   Assessed seated, er/IR adducted     AROM Assessment Site  Shoulder    Right/Left Shoulder  Left    Left Shoulder Flexion  170 Degrees   previous: 135   Left Shoulder ABduction  180 Degrees   previous: 150   Left Shoulder Internal Rotation  90 Degrees   previous: same   Left Shoulder External Rotation  78 Degrees   previous: 60     PROM   Overall PROM Comments   Assessed supine, er/IR adducted     PROM Assessment Site  Shoulder    Right/Left Shoulder  Left    Left Shoulder Flexion  170 Degrees   previous: 170   Left Shoulder ABduction  180 Degrees   previous: 160   Left Shoulder Internal Rotation  90 Degrees   previous: same   Left Shoulder External Rotation  90 Degrees   previous: same     Strength   Overall Strength Comments   Assessed seated, er/IR adducted     Strength Assessment Site  Shoulder    Right/Left Shoulder  Left    Left Shoulder Flexion  5/5   previous: 4+/5   Left Shoulder ABduction  5/5   previous: 4+/5   Left Shoulder Internal Rotation  5/5   previous: 4+/5   Left Shoulder External Rotation  5/5   previous: 4+/5               OT Treatments/Exercises (OP) - 09/16/18 1520      Exercises   Exercises  Shoulder  Shoulder Exercises: Seated   Horizontal ABduction  Theraband;10 reps    Theraband Level (Shoulder Horizontal ABduction)  Level 2 (Red)    External Rotation  Theraband;10 reps    Theraband Level (Shoulder External Rotation)  Level 2 (Red)    Internal Rotation  Theraband;10 reps    Theraband Level (Shoulder Internal Rotation)  Level 2 (Red)    Abduction  Theraband;10 reps    Theraband Level (Shoulder ABduction)  Level 2 (Red)    Diagonals  Theraband;10 reps    Theraband Level (Shoulder Diagonals)  Level 2 (Red)      Shoulder Exercises: Standing   Extension  Theraband;10 reps    Theraband Level (Shoulder Extension)  Level 2 (Red)    Row  Theraband;12 reps    Theraband Level (Shoulder Row)  Level 2 (Red)    Retraction  Theraband;12 reps    Theraband Level (Shoulder Retraction)  Level 2 (Red)      Shoulder Exercises: ROM/Strengthening   UBE (Upper Arm Bike)  Level 4 3' reverse only   pace: 4.0-4.5     Manual Therapy   Manual Therapy  Soft tissue mobilization    Manual therapy comments  Manual therapy completed separately from other skilled interventions    Myofascial Release  Myofascial release to left upper arm, deltoid, and trapezius regions to decrease pain and fascial restrictions and increase joint range of motion              OT Education - 09/16/18 1549    Education Details  red theraband strengthening for shoulder    Person(s) Educated  Patient    Methods  Explanation;Demonstration;Verbal cues;Handout    Comprehension  Returned demonstration;Verbalized understanding       OT Short Term Goals - 09/16/18 1531      OT SHORT TERM GOAL #1   Title  Pt will be educated on and independent in HEP for improving functional use of LUE during ADLs.      Time  4    Period  Weeks    Status  Achieved      OT SHORT TERM GOAL #2   Title  Pt will improve LUE A/ROM to  WNL to improve ability to perform dressing tasks.      Time  4    Period  Weeks    Status  Achieved      OT SHORT TERM GOAL #3   Title  Pt will decrease pain to 3/10 or less to improve ability to perform functional reaching activities while showering.      Time  4    Period  Weeks    Status  Achieved      OT SHORT TERM GOAL #4   Title   Pt will improve strength to 5/5 to improve ability to complete household chores.      Time  4    Period  Weeks    Status  Achieved      OT SHORT TERM GOAL #5   Title  Pt will decrease fascial restriction to trace amounts in order to be able to reach her high kitchen cabinets while cooking.     Time  4    Period  Weeks    Status  Achieved               Plan - 09/16/18 1552    Clinical Impression Statement  A: Reassessment completed this date. patient has met all therapy goals. Reports not difficulties with compelting her  daily tasks. She is using her LUE as normally as possible. Patient presents with 5/5 strength in her left shoulder and full Active and passive ROM. HEP was updated and reviewed. Discharge is recommended and patient is in agreement.     Plan  P: D/C from OT services with HEP.     Consulted and Agree with Plan of Care  Patient       Patient will benefit from skilled therapeutic intervention in order to improve the following deficits and impairments:  Decreased activity tolerance, Decreased strength, Impaired flexibility, Decreased range of motion, Pain, Increased fascial restrictions, Impaired UE functional use  Visit Diagnosis: Pain in left upper arm  Other symptoms and signs involving the musculoskeletal system    Problem List Patient Active Problem List   Diagnosis Date Noted  . Right carotid bruit 09/08/2018  . Pulmonary artery hypertension ([place]) 09/03/2018  . Chest pain 08/01/2018  . Family history of melanoma 07/31/2018  . Renal cancer, left ([place]) 07/31/2018  . Renal mass, left 06/19/2018  . Clostridium  difficile diarrhea   . Weakness   . Hypokalemia, gastrointestinal losses 05/25/2018  . Hypokalemia 05/24/2018  . Dizziness 10/29/2017  . Gait abnormality 10/29/2017  . Tremor, essential 10/29/2017  . Diplopia 10/29/2017  . Obstructive sleep apnea on CPAP 12/10/2016  . Plantar fasciitis, bilateral 12/10/2016  . Fibromyalgia 11/09/2016  . IBS (irritable bowel syndrome) 11/09/2016  . GERD (gastroesophageal reflux disease) 11/09/2016  . Anxiety and depression 11/09/2016  . MGUS (monoclonal gammopathy of unknown significance) 03/15/2016  . Genetic testing 09/08/2015  . Family history of breast cancer   . Family history of colon cancer   . Breast cancer of lower-inner quadrant of right female breast ([place]) 08/26/2015  . Headache 07/17/2015  . Hypertension 07/17/2015  . Obesity (BMI 35.0-39.9 without comorbidity) 07/17/2015    OCCUPATIONAL THERAPY DISCHARGE SUMMARY  Visits from Start of Care: 6  Current functional level related to goals / functional outcomes: See above   Remaining deficits: See above   Education / Equipment: See above Plan: Patient agrees to discharge.  Patient goals were met. Patient is being discharged due to meeting the stated rehab goals.  ?????         Ailene Ravel, OTR/L,CBIS  [phone]  09/16/2018, 4:35 PM  [facility] [address] [place], [place], [address] Phone: [phone]   Fax:  [phone]  Name: Sarah Cooley MRN: [id number] Date of Birth: 1966/08/06

## 2018-09-16 NOTE — Patient Instructions (Signed)
Complete 10-15 repetitions. Complete 1 set. 1 time a day.  Strengthening: Chest Pull - Resisted   Hold Theraband in front of body with hands about shoulder width a part. Pull band a part and back together slowly. Repeat ____ times. Complete ____ set(s) per session.. Repeat ____ session(s) per day.  http://orth.exer.us/926   Copyright  VHI. All rights reserved.   PNF Strengthening: Resisted   Standing with resistive band around each hand, bring right arm up and away, thumb back. Repeat ____ times per set. Do ____ sets per session. Do ____ sessions per day.                           Resisted External Rotation: in Neutral - Bilateral   Sit or stand, tubing in both hands, elbows at sides, bent to 90, forearms forward. Pinch shoulder blades together and rotate forearms out. Keep elbows at sides. Repeat ____ times per set. Do ____ sets per session. Do ____ sessions per day.  http://orth.exer.us/966   Copyright  VHI. All rights reserved.   PNF Strengthening: Resisted   Standing, hold resistive band above head. Bring right arm down and out from side. Repeat ____ times per set. Do ____ sets per session. Do ____ sessions per day.  http://orth.exer.us/922   Copyright  VHI. All rights reserved.

## 2018-09-16 NOTE — Therapy (Signed)
[place], [place], [address] Phone: [phone]   Fax:  [phone]  Physical Therapy Treatment  Patient Details  Name: Sarah Cooley MRN: [id number] Date of Birth: [birth year] Referring Provider (PT): Kathrynn Ducking, MD   Encounter Date: 09/16/2018  PT End of Session - 09/16/18 1524    Visit Number  7    Number of Visits  13    Date for PT Re-Evaluation  09/25/18   Minireassess 09/04/2018   Authorization Type  [employer]BS (30 visit limit PT/OT/[place] combined, 12 used at eval and pt in OT; no auth required)    Authorization Time Period  08/14/18 - 09/26/18    Authorization - Visit Number  7    Authorization - Number of Visits  9    PT Start Time  6546    PT Stop Time  1515    PT Time Calculation (min)  39 min    Activity Tolerance  Patient tolerated treatment well    Behavior During Therapy  [facility] for tasks assessed/performed       Past Medical History:  Diagnosis Date  . Allergy    seasonal  . Anxiety   . Arthritis   . Balance problem   . Brain aneurysm    2 mm ACA region aneurysm by 09/21/15 MRA  . Breast cancer ([place])   . Breast cancer of lower-inner quadrant of right female breast ([place]) 08/26/2015  . Chronic kidney disease    acute renal failure one occasion  . Complication of anesthesia    heart rate drops and O2 sats drop  . Constipation   . Depression   . Diplopia 10/29/2017  . Dizziness 10/29/2017  . Elevated liver function tests   . Family history of adverse reaction to anesthesia    mom has n/v  . Family history of breast cancer   . Family history of colon cancer   . Fibromyalgia   . Fibromyalgia   . Gait abnormality 10/29/2017  . Genetic testing 09/08/2015   Negative genetic testing on the Breast/High Moderate Risk panel. The Breast High/Moderate Risk gene panel offered by [facility] includes sequencing and deletion/duplication analysis of the following 9 genes: ATM, BRCA1, BRCA2, CDH1, CHEK2,  PALB2, PTEN, STK11, and TP53. The report date is September 08, 2015.  The Rest of the Comprehensive Cancer panel has been reflexed and will be available in 2-3 weeks.  POLD1 c.208G>T VUS found on the Remainder of the Comprehensive cancer panel.  The Comprehensive Cancer Panel offered by [facility] includes sequencing and/or deletion duplication testing of the following 32 genes: APC, ATM, AXIN2, BARD1, BMPR1A, BRCA1, BRCA2, BRIP1, CDH1, CDK4, CDKN2A, CHEK2, EPCAM, FANCC, MLH1, MSH2, MSH6, MUTYH, NBN, PALB2, PMS2, POLD1, POLE, PTEN, RAD51C, RAD51D, SCG5/GREM1, SMAD4, STK11, TP53, VHL, and XRCC2.   The report date is 09/12/2015.   Marland Kitchen GERD (gastroesophageal reflux disease)    none recent  . H/O acute renal failure 09/22/15   admitted to [facility]  . Headache    occipital and temporal  . History of hiatal hernia   . Hyperlipemia   . Hyperlipidemia   . Hypertension   . Irritable bowel syndrome (IBS)   . MGUS (monoclonal gammopathy of unknown significance) 03/15/2016  . Neuromuscular disorder (HCC)    fibromyalgia  . Occasional tremors   . OSA (obstructive sleep apnea)    uses cpap with humidification- auto   . Personal history of radiation therapy   . PONV (postoperative nausea  and vomiting)   . Renal cell carcinoma ([place])   . Tremor, essential 10/29/2017    Past Surgical History:  Procedure Laterality Date  . ABDOMINAL HYSTERECTOMY     adenomyosis  . ABDOMINAL HYSTERECTOMY    . BLADDER SURGERY    . BONE GRAFT HIP ILIAC CREST    . BREAST BIOPSY Bilateral   . BREAST LUMPECTOMY    . BREAST SURGERY    . CHOLECYSTECTOMY    . FRACTURE SURGERY Right    wrist  . KNEE ARTHROSCOPY    . KNEE ARTHROSCOPY W/ ACL RECONSTRUCTION     right  . RADIOACTIVE SEED GUIDED PARTIAL MASTECTOMY WITH AXILLARY SENTINEL LYMPH NODE BIOPSY Right 10/13/2015   Procedure: RADIOACTIVE SEED GUIDED PARTIAL MASTECTOMY WITH AXILLARY SENTINEL LYMPH NODE BIOPSY;  Surgeon: Erroll Luna, MD;  Location: [facility];  Service:  General;  Laterality: Right;  . right carpal and cubital tunnel release     . ROBOTIC ASSITED PARTIAL NEPHRECTOMY Left 06/19/2018   Procedure: XI ROBOTIC ASSITED PARTIAL NEPHRECTOMY;  Surgeon: Ardis Hughs, MD;  [facility];  Service: Urology;  Laterality: Left;  . WRIST SURGERY      There were no vitals filed for this visit.  Subjective Assessment - 09/16/18 1439    Subjective  Patient reported that she has had a lot of back pain today. She stated she's been sore and that she was particularly sore following joint mobilizations previously.     Pertinent History  06/19/18 Lt partial nephrectomy, breast cancer 2016, pulmonary HTN, fibromyalgia, sleep apnea    Limitations  Walking;Other (comment);Standing;Sitting;House hold activities    Patient Stated Goals  to have less pain and be able to sleep through the night    Currently in Pain?  Yes    Pain Score  6     Pain Location  Back    Pain Orientation  Lower    Pain Descriptors / Indicators  Aching    Pain Type  Chronic pain    Pain Onset  More than a month ago                       [facility] Adult PT Treatment/Exercise - 09/16/18 0001      Lumbar Exercises: Stretches   Lower Trunk Rotation  10 seconds;5 reps    Standing Extension  10 reps    Standing Extension Limitations  at counter, 10 sec holds    Prone on Elbows Stretch  10 seconds    Prone on Elbows Stretch Limitations  10 reps    Press Ups Limitations  10x 5 seconds      Lumbar Exercises: Standing   Functional Squats  10 reps;3 seconds    Functional Squats Limitations  chair taps    Other Standing Lumbar Exercises  Tandem stance on foam 2 x 30'' each LE      Manual Therapy   Manual Therapy  Soft tissue mobilization    Manual therapy comments  Manual therapy completed separately from other skilled interventions    Soft tissue mobilization  To bilateral latissimus dorsi muscle, superficially             PT Education - 09/16/18 1524     Education Details  Discussed purpose and technique of interventions throughout session.     Person(s) Educated  Patient    Methods  Explanation    Comprehension  Verbalized understanding       PT Short Term Goals - 09/04/18  [place] #1   Title  Patient will be independent with HEP, updated PRN, to improve LE strength to improve posture and reduce LBP with mobility.    Time  2    Period  Weeks    Status  Achieved        PT Long Term Goals - 09/04/18 1006      PT LONG TERM GOAL #1   Title  Patient will improve MMT to 5/5 thorughout bil LE to demonstrate significant improvement in functional strength to improve activity tolerance and endurance.    Time  4    Period  Weeks    Status  On-going      PT LONG TERM GOAL #2   Title  Patient will improve lumbar ROM to WNL's for all planes and be pain free throughout to demosntrate improved mobility and position tolerance.    Time  4    Period  Weeks    Status  On-going      PT LONG TERM GOAL #3   Title  Patient will deny symptoms radiating beyond knees to demonstrate centralizaiton of radicular symptoms in bil LE's to indicate reduce sciatic nerve comrpession and reduce patient's pain.     Time  4    Period  Weeks    Status  Partially Met      PT LONG TERM GOAL #4   Title  Patient will reports being able to sleep through the night for 1 week straight without and increase in pain to improve QOL and overal health/wellness.    Time  4    Period  Weeks    Status  On-going            Plan - 09/16/18 1526    Clinical Impression Statement  This session continued with established plan of care. Progressed patient with balance exercises by having patient perform tandem stance on foam this session. Also, modified manual therapy to be soft tissue mobilization to patient's bilateral latissimus dorsi muscles to address pain in low back up to shoulders. Patient denied any increase in pain following this.     Rehab  Potential  Good    PT Frequency  2x / week    PT Duration  6 weeks    PT Treatment/Interventions  ADLs/Self Care Home Management;Aquatic Therapy;Cryotherapy;Electrical Stimulation;Moist Heat;Traction;DME Instruction;Gait training;Stair training;Functional mobility training;Therapeutic activities;Therapeutic exercise;Balance training;Neuromuscular re-education;Patient/family education;Manual techniques;Passive range of motion;Taping;Spinal Manipulations;Joint Manipulations    PT Next Visit Plan  Follow-up on response to soft tissue mobilization. Continue to progress core and hip strengthening. Advance balance challenges with tandem, SLS, and compliant surfaces. Continue extension postures/stretches for pain relief and manual PRN.    PT Home Exercise Plan  Eval: prone on elbows, piriformis stretch, LTR, bridge; 09/05/18 standing lumbar extension    Consulted and Agree with Plan of Care  Patient       Patient will benefit from skilled therapeutic intervention in order to improve the following deficits and impairments:  Decreased endurance, Decreased range of motion, Decreased strength, Impaired sensation, Pain, Postural dysfunction, Difficulty walking, Cardiopulmonary status limiting activity, Decreased balance, Decreased mobility, Decreased activity tolerance, Hypomobility, Obesity  Visit Diagnosis: Chronic bilateral low back pain with left-sided sciatica  Muscle weakness (generalized)  Difficulty in walking, not elsewhere classified     Problem List Patient Active Problem List   Diagnosis Date Noted  . Right carotid bruit 09/08/2018  . Pulmonary artery hypertension ([place]) 09/03/2018  . Chest  pain 08/01/2018  . Family history of melanoma 07/31/2018  . Renal cancer, left ([place]) 07/31/2018  . Renal mass, left 06/19/2018  . Clostridium difficile diarrhea   . Weakness   . Hypokalemia, gastrointestinal losses 05/25/2018  . Hypokalemia 05/24/2018  . Dizziness 10/29/2017  . Gait abnormality  10/29/2017  . Tremor, essential 10/29/2017  . Diplopia 10/29/2017  . Obstructive sleep apnea on CPAP 12/10/2016  . Plantar fasciitis, bilateral 12/10/2016  . Fibromyalgia 11/09/2016  . IBS (irritable bowel syndrome) 11/09/2016  . GERD (gastroesophageal reflux disease) 11/09/2016  . Anxiety and depression 11/09/2016  . MGUS (monoclonal gammopathy of unknown significance) 03/15/2016  . Genetic testing 09/08/2015  . Family history of breast cancer   . Family history of colon cancer   . Breast cancer of lower-inner quadrant of right female breast ([place]) 08/26/2015  . Headache 07/17/2015  . Hypertension 07/17/2015  . Obesity (BMI 35.0-39.9 without comorbidity) 07/17/2015   Clarene Critchley PT, DPT 3:28 PM, 09/16/18 [facility] [address] [place], [place], [address] Phone: [phone]   Fax:  [phone]  Name: Sarah Cooley MRN: [id number] Date of Birth: October 15, 1966

## 2018-09-17 DIAGNOSIS — I10 Essential (primary) hypertension: Secondary | ICD-10-CM | POA: Diagnosis not present

## 2018-09-17 DIAGNOSIS — A6920 Lyme disease, unspecified: Secondary | ICD-10-CM | POA: Diagnosis not present

## 2018-09-22 ENCOUNTER — Telehealth (HOSPITAL_COMMUNITY): Payer: Self-pay | Admitting: Occupational Therapy

## 2018-09-22 NOTE — Telephone Encounter (Signed)
Sarah Cooley called stating her BCBS expired 09/21/18 and she requested to be put on hold until Jan. NF 09/22/18

## 2018-09-23 ENCOUNTER — Ambulatory Visit (HOSPITAL_COMMUNITY): Payer: BLUE CROSS/BLUE SHIELD

## 2018-09-23 ENCOUNTER — Encounter (HOSPITAL_COMMUNITY): Payer: BLUE CROSS/BLUE SHIELD | Admitting: Occupational Therapy

## 2018-09-23 ENCOUNTER — Encounter (HOSPITAL_COMMUNITY): Payer: BLUE CROSS/BLUE SHIELD

## 2018-09-25 ENCOUNTER — Encounter (HOSPITAL_COMMUNITY): Payer: BLUE CROSS/BLUE SHIELD

## 2018-09-25 ENCOUNTER — Encounter (HOSPITAL_COMMUNITY): Payer: BLUE CROSS/BLUE SHIELD | Admitting: Occupational Therapy

## 2018-09-30 ENCOUNTER — Encounter (HOSPITAL_COMMUNITY): Payer: BLUE CROSS/BLUE SHIELD | Admitting: Occupational Therapy

## 2018-09-30 ENCOUNTER — Encounter (HOSPITAL_COMMUNITY): Payer: BLUE CROSS/BLUE SHIELD

## 2018-10-02 ENCOUNTER — Encounter (HOSPITAL_COMMUNITY): Payer: BLUE CROSS/BLUE SHIELD | Admitting: Occupational Therapy

## 2018-10-02 ENCOUNTER — Encounter (HOSPITAL_COMMUNITY): Payer: BLUE CROSS/BLUE SHIELD

## 2018-10-09 ENCOUNTER — Encounter (HOSPITAL_COMMUNITY): Payer: BLUE CROSS/BLUE SHIELD

## 2018-11-03 ENCOUNTER — Encounter: Payer: Self-pay | Admitting: Internal Medicine

## 2018-11-03 ENCOUNTER — Ambulatory Visit (INDEPENDENT_AMBULATORY_CARE_PROVIDER_SITE_OTHER): Payer: BLUE CROSS/BLUE SHIELD | Admitting: Internal Medicine

## 2018-11-03 VITALS — BP 129/84 | HR 72 | Temp 98.1°F | Wt 223.0 lb

## 2018-11-03 DIAGNOSIS — R269 Unspecified abnormalities of gait and mobility: Secondary | ICD-10-CM | POA: Diagnosis not present

## 2018-11-03 DIAGNOSIS — R768 Other specified abnormal immunological findings in serum: Secondary | ICD-10-CM

## 2018-11-03 DIAGNOSIS — M1990 Unspecified osteoarthritis, unspecified site: Secondary | ICD-10-CM | POA: Diagnosis not present

## 2018-11-03 DIAGNOSIS — K589 Irritable bowel syndrome without diarrhea: Secondary | ICD-10-CM

## 2018-11-03 DIAGNOSIS — M797 Fibromyalgia: Secondary | ICD-10-CM | POA: Diagnosis not present

## 2018-11-03 DIAGNOSIS — Z923 Personal history of irradiation: Secondary | ICD-10-CM

## 2018-11-03 DIAGNOSIS — A6920 Lyme disease, unspecified: Secondary | ICD-10-CM

## 2018-11-03 DIAGNOSIS — Z853 Personal history of malignant neoplasm of breast: Secondary | ICD-10-CM

## 2018-11-03 DIAGNOSIS — R251 Tremor, unspecified: Secondary | ICD-10-CM

## 2018-11-03 DIAGNOSIS — Z85528 Personal history of other malignant neoplasm of kidney: Secondary | ICD-10-CM

## 2018-11-03 DIAGNOSIS — Z9221 Personal history of antineoplastic chemotherapy: Secondary | ICD-10-CM

## 2018-11-03 DIAGNOSIS — I671 Cerebral aneurysm, nonruptured: Secondary | ICD-10-CM

## 2018-11-03 DIAGNOSIS — Z9181 History of falling: Secondary | ICD-10-CM

## 2018-11-03 DIAGNOSIS — F329 Major depressive disorder, single episode, unspecified: Secondary | ICD-10-CM

## 2018-11-03 DIAGNOSIS — F419 Anxiety disorder, unspecified: Secondary | ICD-10-CM

## 2018-11-03 DIAGNOSIS — Z905 Acquired absence of kidney: Secondary | ICD-10-CM

## 2018-11-03 DIAGNOSIS — Z8719 Personal history of other diseases of the digestive system: Secondary | ICD-10-CM

## 2018-11-03 NOTE — Patient Instructions (Signed)
  Ibuprofen 400mg  plus tylenol 500mg  combined once or twice a day for arthritis

## 2018-11-03 NOTE — Progress Notes (Signed)
RFV: initial appt for positive lyme disease  Patient ID: Sarah Cooley, female   DOB: September 26, 1966, 53 y.o.   MRN: [id number]  HPI Sarah Cooley is a 53yo F who reports having felt unwell with constellation of osteoarthritis, myalgias, fatigue in 2004 - dr zeminski for rheumatology referral, ruled out everything? Had some osteoarthritis, and fibromyalgia, had depression and anxiety and ibs at the time  2015 her health worsening, with balance problems, had repeat falls. Oct 2016 dx with breast ca s/p right lumpectomy with radiation and chemo in Jan 2017. She was seen by Neurologist - Sarah Cooley  x 2, to evaluate gait disturbance.  Found to have small aneurysm in the setting of acute onset HA. Now seeing dr Sarah Cooley. Now off of her anxiety meds - gait issues/ falling and ongoing tremors. Thought to have essential tremor. Nerve conduction study - which is clean  In the past year, she was Dx with kidney cancer- had nephrectomy in august 2019. Followed by Recent hospitalization for not being abel to breath " dx with anxiety attack" -  She is Finding it difficult to find the right diagnosis. Thus, PCP - did draw lyme serology. IgG showed 5 bands, and IgM negative; she was treated with 30 d of doxycycline in november, in retrospect thought she improved alittle bit. Wondering if she should have further abtx. She heard someone who has had been treated for a year.  Now back to seeing rheumatology - seeing dr Sarah Cooley. - given diagnosis of fibromyalgia- in august, inflammatory markers were okay.  She reports to have Missed a lot of work this year.   Hx of cdifficile - due to diarrhea, in august 2019- tx'd by dr fields  Lived in [place] fall 1991-92 - [place]-  Now lives in farm in [place] up to 2 years ago Previously worked as a Psychologist, sport and exercise    Outpatient Encounter Medications as of 11/03/2018  Medication Sig  . dicyclomine (BENTYL) 20 MG tablet Take 20 mg by mouth as needed.   Marland Kitchen  DILT-XR 120 MG 24 hr capsule Take 120 mg by mouth daily.   Marland Kitchen FLUoxetine (PROZAC) 20 MG capsule Take 60 mg by mouth daily.  Marland Kitchen losartan (COZAAR) 25 MG tablet Take 25 mg by mouth daily.  . pramipexole (MIRAPEX) 0.125 MG tablet One tablet at dinnertime and 2 at bedtime  . pravastatin (PRAVACHOL) 10 MG tablet Take 10 mg by mouth daily.  Marland Kitchen topiramate (TOPAMAX) 25 MG tablet Take 1 tablet (25 mg total) by mouth See admin instructions. Take one in the morning and 2 in the evening  . traMADol (ULTRAM) 50 MG tablet Take 1 tablet (50 mg total) by mouth every 6 (six) hours as needed.  . Vitamin D, Ergocalciferol, (DRISDOL) 50000 units CAPS capsule Take 50,000 Units by mouth every 7 (seven) days. Take 1 tablet (50,000 units) by mouth every Thursday  . calcium carbonate (TUMS - DOSED IN MG ELEMENTAL CALCIUM) 500 MG chewable tablet Chew 1 tablet (200 mg of elemental calcium total) by mouth 2 (two) times daily with breakfast and lunch. (Patient not taking: Reported on 11/03/2018)  . furosemide (LASIX) 20 MG tablet Take 1 tablet (20 mg total) by mouth daily as needed for edema.  . hydrOXYzine (ATARAX/VISTARIL) 25 MG tablet Take 25 mg by mouth every 6 (six) hours as needed.  Marland Kitchen OVER THE COUNTER MEDICATION Take 1 capsule by mouth daily as needed. Diphenhydramate Over The Counter Anti-emetic   No facility-administered encounter medications on file as  of 11/03/2018.      Patient Active Problem List   Diagnosis Date Noted  . Right carotid bruit 09/08/2018  . Pulmonary artery hypertension ([place]) 09/03/2018  . Chest pain 08/01/2018  . Family history of melanoma 07/31/2018  . Renal cancer, left (Sarah Cooley) 07/31/2018  . Renal mass, left 06/19/2018  . Clostridium difficile diarrhea   . Weakness   . Hypokalemia, gastrointestinal losses 05/25/2018  . Hypokalemia 05/24/2018  . Dizziness 10/29/2017  . Gait abnormality 10/29/2017  . Tremor, essential 10/29/2017  . Diplopia 10/29/2017  . Obstructive sleep apnea on CPAP  12/10/2016  . Plantar fasciitis, bilateral 12/10/2016  . Fibromyalgia 11/09/2016  . IBS (irritable bowel syndrome) 11/09/2016  . GERD (gastroesophageal reflux disease) 11/09/2016  . Anxiety and depression 11/09/2016  . MGUS (monoclonal gammopathy of unknown significance) 03/15/2016  . Genetic testing 09/08/2015  . Family history of breast cancer   . Family history of colon cancer   . Breast cancer of lower-inner quadrant of right female breast (Sarah Cooley) 08/26/2015  . Headache 07/17/2015  . Hypertension 07/17/2015  . Obesity (BMI 35.0-39.9 without comorbidity) 07/17/2015     Health Maintenance Due  Topic Date Due  . INFLUENZA VACCINE  05/22/2018    Social History   Tobacco Use  . Smoking status: Never Smoker  . Smokeless tobacco: Never Used  Substance Use Topics  . Alcohol use: Never    Frequency: Never    Comment: maybe one a year  . Drug use: Never  family history includes Autoimmune disease in her mother; Breast cancer in her paternal aunt and sister; COPD in her maternal uncle; Colon cancer in her paternal uncle, paternal uncle, and paternal uncle; Eczema in her mother; Heart disease in her father and maternal aunt; Hypertension in her father and mother; Lung cancer in her paternal aunt and paternal grandfather; Lupus in her sister; Melanoma (age of onset: 36) in her father; Mental illness in her mother and son; Parkinson's disease in her mother; Parkinsonism in her paternal aunt; Polycystic ovary syndrome in an other family member; Psoriasis in her father; Skin cancer in her maternal uncle.  Review of Systems Per hpi, arthralgia, temporal headache, gait instability, tremor. Otherwise 12 point ros is negative   BP 129/84   Pulse 72   Temp 98.1 F (36.7 C) (Oral)   Wt 223 lb (101.2 kg)   BMI 33.66 kg/m   Did not examine  Lab Results  Component Value Date   LABRPR Non Reactive 10/29/2017    CBC Lab Results  Component Value Date   WBC 6.4 08/01/2018   RBC 4.39  08/01/2018   HGB 13.5 08/01/2018   HCT 40.6 08/01/2018   PLT 285 08/01/2018   MCV 92.5 08/01/2018   MCH 30.8 08/01/2018   MCHC 33.3 08/01/2018   RDW 12.7 08/01/2018   LYMPHSABS 2.0 08/01/2018   MONOABS 0.6 08/01/2018   EOSABS 0.1 08/01/2018    BMET Lab Results  Component Value Date   NA 140 08/01/2018   K 3.4 (L) 08/01/2018   CL 109 08/01/2018   CO2 21 (L) 08/01/2018   GLUCOSE 95 08/01/2018   BUN 12 08/01/2018   CREATININE 0.79 08/01/2018   CALCIUM 9.3 08/01/2018   GFRNONAA >60 08/01/2018   GFRAA >60 08/01/2018    Assessment and Plan  Discussed with patient that differrent presentation of lyme disease and the phenomenon of post lyme sequelae which is not well understood in terms of further management. Discussed that there has not been any proof  that further abtx would make any meaningful difference to her symptoms despite having heard secondhand from others/acquintances that long term abtx are needed.  Since she has already received 30 d course of doxycycline. No further abtx are needed at this time  Neuro lyme disease often presents as cranial nerve deficit but unsure if it would present as gait instability. Will reach out to dr Sarah Cooley  Who is working up gait instability to see if they are planning on doing LP and if so, would consider adding B. burgdorffi ab and PCR.   Spent 45 min with patient in greater than 50% in counseling about lyme disease

## 2018-11-18 ENCOUNTER — Encounter (HOSPITAL_COMMUNITY): Payer: Self-pay | Admitting: Physical Therapy

## 2018-11-18 NOTE — Therapy (Signed)
[name] [place], [place], [address] Phone: [phone]   Fax:  [phone]  Patient Details  Name: Sarah Cooley MRN: [id number] Date of Birth: 1966-08-03 Referring Provider:  No ref. provider found  Encounter Date: 11/18/2018   PHYSICAL THERAPY DISCHARGE SUMMARY  Visits from Start of Care: 7  Current functional level related to goals / functional outcomes: Unable to fully assess as patient did not return to physical therapy for re-assessment.     Remaining deficits: Unable to fully assess as patient did not return to physical therapy for re-assessment.     Education / Equipment: Patient had been educated on HEP Plan: Patient agrees to discharge.  Patient goals were partially met. Patient is being discharged due to not returning since the last visit.  ?????         Clarene Critchley PT, DPT 3:46 PM, 11/18/18 [place], [place], [address] Phone: [phone]   Fax:  [phone]

## 2018-11-19 ENCOUNTER — Ambulatory Visit: Payer: BLUE CROSS/BLUE SHIELD | Admitting: Cardiology

## 2018-11-19 DIAGNOSIS — I272 Pulmonary hypertension, unspecified: Secondary | ICD-10-CM | POA: Diagnosis not present

## 2018-11-19 DIAGNOSIS — A6920 Lyme disease, unspecified: Secondary | ICD-10-CM | POA: Diagnosis not present

## 2018-11-24 ENCOUNTER — Inpatient Hospital Stay (HOSPITAL_COMMUNITY): Payer: BLUE CROSS/BLUE SHIELD | Attending: Hematology

## 2018-11-24 DIAGNOSIS — Z79899 Other long term (current) drug therapy: Secondary | ICD-10-CM | POA: Diagnosis not present

## 2018-11-24 DIAGNOSIS — I10 Essential (primary) hypertension: Secondary | ICD-10-CM | POA: Insufficient documentation

## 2018-11-24 DIAGNOSIS — Z905 Acquired absence of kidney: Secondary | ICD-10-CM | POA: Insufficient documentation

## 2018-11-24 DIAGNOSIS — Z853 Personal history of malignant neoplasm of breast: Secondary | ICD-10-CM | POA: Insufficient documentation

## 2018-11-24 DIAGNOSIS — Z803 Family history of malignant neoplasm of breast: Secondary | ICD-10-CM | POA: Insufficient documentation

## 2018-11-24 DIAGNOSIS — Z923 Personal history of irradiation: Secondary | ICD-10-CM | POA: Insufficient documentation

## 2018-11-24 DIAGNOSIS — Z171 Estrogen receptor negative status [ER-]: Secondary | ICD-10-CM | POA: Diagnosis not present

## 2018-11-24 DIAGNOSIS — Z800 Family history of malignant neoplasm of digestive organs: Secondary | ICD-10-CM | POA: Diagnosis not present

## 2018-11-24 DIAGNOSIS — C642 Malignant neoplasm of left kidney, except renal pelvis: Secondary | ICD-10-CM | POA: Insufficient documentation

## 2018-11-24 DIAGNOSIS — Z801 Family history of malignant neoplasm of trachea, bronchus and lung: Secondary | ICD-10-CM | POA: Diagnosis not present

## 2018-11-24 DIAGNOSIS — R0602 Shortness of breath: Secondary | ICD-10-CM

## 2018-11-24 DIAGNOSIS — M25559 Pain in unspecified hip: Secondary | ICD-10-CM

## 2018-11-24 LAB — COMPREHENSIVE METABOLIC PANEL
ALBUMIN: 3.8 g/dL (ref 3.5–5.0)
ALK PHOS: 55 U/L (ref 38–126)
ALT: 21 U/L (ref 0–44)
ANION GAP: 9 (ref 5–15)
AST: 19 U/L (ref 15–41)
BILIRUBIN TOTAL: 0.4 mg/dL (ref 0.3–1.2)
BUN: 21 mg/dL — ABNORMAL HIGH (ref 6–20)
CO2: 20 mmol/L — ABNORMAL LOW (ref 22–32)
Calcium: 8.8 mg/dL — ABNORMAL LOW (ref 8.9–10.3)
Chloride: 108 mmol/L (ref 98–111)
Creatinine, Ser: 0.86 mg/dL (ref 0.44–1.00)
GFR calc Af Amer: >60 mL/min (ref >60)
GLUCOSE: 121 mg/dL — AB (ref 70–99)
Potassium: 3.4 mmol/L — ABNORMAL LOW (ref 3.5–5.1)
Sodium: 137 mmol/L (ref 135–145)
TOTAL PROTEIN: 7.2 g/dL (ref 6.5–8.1)

## 2018-11-24 LAB — CBC WITH DIFFERENTIAL/PLATELET
Abs Immature Granulocytes: 0.02 10*3/uL (ref 0.00–0.07)
BASOS ABS: 0 10*3/uL (ref 0.0–0.1)
Basophils Relative: 0 %
EOS ABS: 0.2 10*3/uL (ref 0.0–0.5)
EOS PCT: 3 %
HEMATOCRIT: 39.3 % (ref 36.0–46.0)
HEMOGLOBIN: 12.7 g/dL (ref 12.0–15.0)
Immature Granulocytes: 0 %
LYMPHS ABS: 1.7 10*3/uL (ref 0.7–4.0)
LYMPHS PCT: 28 %
MCH: 29.6 pg (ref 26.0–34.0)
MCHC: 32.3 g/dL (ref 30.0–36.0)
MCV: 91.6 fL (ref 80.0–100.0)
MONO ABS: 0.5 10*3/uL (ref 0.1–1.0)
MONOS PCT: 8 %
Neutro Abs: 3.7 10*3/uL (ref 1.7–7.7)
Neutrophils Relative %: 61 %
Platelets: 215 10*3/uL (ref 150–400)
RBC: 4.29 MIL/uL (ref 3.87–5.11)
RDW: 12.8 % (ref 11.5–15.5)
WBC: 6.2 10*3/uL (ref 4.0–10.5)
nRBC: 0 % (ref 0.0–0.2)

## 2018-11-24 LAB — LACTATE DEHYDROGENASE: LDH: 162 U/L (ref 98–192)

## 2018-12-01 ENCOUNTER — Other Ambulatory Visit (HOSPITAL_COMMUNITY): Payer: Self-pay | Admitting: Nurse Practitioner

## 2018-12-01 ENCOUNTER — Ambulatory Visit (HOSPITAL_COMMUNITY): Payer: BLUE CROSS/BLUE SHIELD

## 2018-12-01 ENCOUNTER — Other Ambulatory Visit: Payer: Self-pay

## 2018-12-01 ENCOUNTER — Encounter (HOSPITAL_COMMUNITY): Payer: Self-pay | Admitting: Hematology

## 2018-12-01 ENCOUNTER — Inpatient Hospital Stay (HOSPITAL_BASED_OUTPATIENT_CLINIC_OR_DEPARTMENT_OTHER): Payer: BLUE CROSS/BLUE SHIELD | Admitting: Hematology

## 2018-12-01 VITALS — BP 125/80 | HR 75 | Temp 98.1°F | Resp 16 | Wt 233.2 lb

## 2018-12-01 DIAGNOSIS — Z923 Personal history of irradiation: Secondary | ICD-10-CM | POA: Diagnosis not present

## 2018-12-01 DIAGNOSIS — C50311 Malignant neoplasm of lower-inner quadrant of right female breast: Secondary | ICD-10-CM

## 2018-12-01 DIAGNOSIS — Z800 Family history of malignant neoplasm of digestive organs: Secondary | ICD-10-CM | POA: Diagnosis not present

## 2018-12-01 DIAGNOSIS — D472 Monoclonal gammopathy: Secondary | ICD-10-CM

## 2018-12-01 DIAGNOSIS — Z853 Personal history of malignant neoplasm of breast: Secondary | ICD-10-CM | POA: Diagnosis not present

## 2018-12-01 DIAGNOSIS — Z803 Family history of malignant neoplasm of breast: Secondary | ICD-10-CM | POA: Diagnosis not present

## 2018-12-01 DIAGNOSIS — Z171 Estrogen receptor negative status [ER-]: Secondary | ICD-10-CM

## 2018-12-01 DIAGNOSIS — Z801 Family history of malignant neoplasm of trachea, bronchus and lung: Secondary | ICD-10-CM | POA: Diagnosis not present

## 2018-12-01 DIAGNOSIS — C642 Malignant neoplasm of left kidney, except renal pelvis: Secondary | ICD-10-CM | POA: Diagnosis not present

## 2018-12-01 DIAGNOSIS — Z79899 Other long term (current) drug therapy: Secondary | ICD-10-CM | POA: Diagnosis not present

## 2018-12-01 DIAGNOSIS — D0511 Intraductal carcinoma in situ of right breast: Secondary | ICD-10-CM

## 2018-12-01 DIAGNOSIS — Z905 Acquired absence of kidney: Secondary | ICD-10-CM | POA: Diagnosis not present

## 2018-12-01 DIAGNOSIS — I10 Essential (primary) hypertension: Secondary | ICD-10-CM | POA: Diagnosis not present

## 2018-12-01 NOTE — Assessment & Plan Note (Addendum)
1.  Right breast high-grade DCIS: - Status post right lumpectomy and sentinel lymph node biopsy on 10/13/2015 by Dr. Brantley Stage.  0.4 cm high-grade DCIS, 0/1 lymph nodes positive, ER/PR negative, PT ISP N0. -He underwent radiation therapy in the adjuvant setting. -On 05/01/2017 she underwent left breast biopsy UOQ-fibroadenoma.  Left breast UIQ biopsy showed fibrocystic change. -Today's physical examination did not show any palpable masses.  Lumpectomy scar at the inferior margin of area of is well-healed. -Mammogram on 03/24/2018 was BI-RADS Category 2.  Recent genetic testing was negative. -We will schedule her for mammogram in June.  2.  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 -Bone scan on 08/07/2018 was negative. -She has follow-up with her urologist for a CT scan of the abdomen.  3.  Elevated kappa light chains: - SPEP on 07/24/2018 was negative.  Free kappa light chains were mildly elevated at 27 and ratio of 1.64. - Bone marrow biopsy on 02/29/2016 shows mildly hypocellular bone marrow with mild polytypic plasmacytosis (5 to 10%). -We plan to repeat her free light chain ratio, SPEP and immunofixation prior to next visit in 6 months.

## 2018-12-01 NOTE — Patient Instructions (Signed)
[facility] Discharge Instructions     Thank you for choosing [facility] at [facility] to provide your oncology and hematology care.  To afford each patient quality time with our provider, please arrive at least 15 minutes before your scheduled appointment time.   If you have a lab appointment with the Cancer Center please come in thru the  Main Entrance and check in at the main information desk  You need to re-schedule your appointment should you arrive 10 or more minutes late.  We strive to give you quality time with our providers, and arriving late affects you and other patients whose appointments are after yours.  Also, if you no show three or more times for appointments you may be dismissed from the clinic at the providers discretion.     Again, thank you for choosing [facility].  Our hope is that these requests will decrease the amount of time that you wait before being seen by our physicians.       _____________________________________________________________  Should you have questions after your visit to [facility], please contact our office at [phone] between the hours of 8:00 a.m. and 4:30 p.m.  Voicemails left after 4:00 p.m. will not be returned until the following business day.  For prescription refill requests, have your pharmacy contact our office and allow 72 hours.    Cancer Center Support Programs:   > Cancer Support Group  2nd Tuesday of the month 1pm-2pm, Journey Room    

## 2018-12-01 NOTE — Progress Notes (Signed)
Sarah Cooley, [place] [id number]   CLINIC:  Medical Oncology/Hematology  PCP:  Sarah Squibb, MD [address] [place] [address] [phone]   REASON FOR VISIT: Follow-up for breast cancer of lower-inner quadrant of right female breast and history of left renal cell cancer and MGUS  CURRENT THERAPY: Observation   BRIEF ONCOLOGIC HISTORY:  Oncology History   Stage 0: Right breast DCIS, high-grade, ER/PR negative Clear Cell RCC     Breast cancer of lower-inner quadrant of right female breast ([place])   08/19/2015 Breast US    Masslike asymmetry within the lower inner quadrant of the right breast, at posterior depth, with associated microcalcifications which are new.     08/23/2015 Initial Biopsy    AT LEAST HIGH DUCTAL CARCINOMA IN SITU    08/23/2015 Receptors her2    Estrogen Receptor: 0%, NEGATIVE Progesterone Receptor: 0%, NEGATIVE    08/26/2015 Initial Diagnosis    Breast cancer of lower-inner quadrant of right female breast ([place])    08/31/2015 Procedure    [facility] negative.  Genes tested include: ATM, BRCA1, BRCA2, CDH1, CHEK2, PALB2, PTEN, & TP53.     10/13/2015 Pathologic Stage    pTis, pN0: Stage 0     10/13/2015 Surgery    Right lumpectomy & SLNB ([name]). High grade DCIS, 0.4 cm. Negative margins.  1 right axillary SLN neg. ER/PR repeated and both remain negative.     11/17/2015 - 12/21/2015 Radiation Therapy    Treated at [facility] [place] Right breast: Total dose 50 Gy in 25 fractions. 3-D tangents; 6 MV photons.     08/22/2018 Genetic Testing    Negative genetic testing on the CancerNExt Expanded+RNA insight panel.  The CancerNext-Expanded gene panel offered by [facility] and includes sequencing and rearrangement analysis for the following 67 genes: AIP, ALK, APC*, ATM*, BAP1, BARD1, BLM, BMPR1A, BRCA1*, BRCA2*, BRIP1*, CDH1*, CDK4, CDKN1B, CDKN2A, CHEK2*, DICER1, FANCC, FH, FLCN, GALNT12, HOXB13, MAX,  MEN1, MET, MLH1*, MRE11A, MSH2*, MSH6*, MUTYH*, NBN, NF1*, NF2, PALB2*, PHOX2B, PMS2*, POLD1, POLE, POT1, PRKAR1A, PTCH1, PTEN*, RAD50, RAD51C*, RAD51D*, RB1, RET, SDHA, SDHAF2, SDHB, SDHC, SDHD, SMAD4, SMARCA4, SMARCB1, SMARCE1, STK11, SUFU, TMEM127, TP53*, TSC1, TSC2, VHL and XRCC2 (sequencing and deletion/duplication); MITF (sequencing only); EPCAM and GREM1 (deletion/duplication only). DNA and RNA analyses performed for * genes. The report date is 08/22/2018.     Renal cancer, left ([place])   07/31/2018 Initial Diagnosis    Renal cancer, left ([place])    08/22/2018 Genetic Testing    Negative genetic testing on the CancerNExt Expanded+RNA insight panel.  The CancerNext-Expanded gene panel offered by [facility] and includes sequencing and rearrangement analysis for the following 67 genes: AIP, ALK, APC*, ATM*, BAP1, BARD1, BLM, BMPR1A, BRCA1*, BRCA2*, BRIP1*, CDH1*, CDK4, CDKN1B, CDKN2A, CHEK2*, DICER1, FANCC, FH, FLCN, GALNT12, HOXB13, MAX, MEN1, MET, MLH1*, MRE11A, MSH2*, MSH6*, MUTYH*, NBN, NF1*, NF2, PALB2*, PHOX2B, PMS2*, POLD1, POLE, POT1, PRKAR1A, PTCH1, PTEN*, RAD50, RAD51C*, RAD51D*, RB1, RET, SDHA, SDHAF2, SDHB, SDHC, SDHD, SMAD4, SMARCA4, SMARCB1, SMARCE1, STK11, SUFU, TMEM127, TP53*, TSC1, TSC2, VHL and XRCC2 (sequencing and deletion/duplication); MITF (sequencing only); EPCAM and GREM1 (deletion/duplication only). DNA and RNA analyses performed for * genes. The report date is 08/22/2018.      CANCER STAGING: Cancer Staging Breast cancer of lower-inner quadrant of right female breast [facility]) Staging form: Breast, AJCC 7th Edition - Clinical stage from 08/31/2015: Stage 0 (Tis (DCIS), N0, M0) - Unsigned - Pathologic stage from 10/13/2015:  Stage 0 (Tis (DCIS), N0, cM0) - Signed by Sarah Bouche, NP on 02/09/2016    INTERVAL HISTORY:  Sarah Cooley 53 y.o. female returns for routine follow-up for breast cancer, history of renal cell cancer, and MGUS. She is here today with her husband  and doing well. She reports occasional constipation and nausea. She denies any fevers, chills, night sweats, or unexplained weight loss. Denies any nausea, vomiting, or diarrhea. Denies any new pains. Had not noticed any recent bleeding such as epistaxis, hematuria or hematochezia. Denies recent chest pain on exertion, shortness of breath on minimal exertion, pre-syncopal episodes, or palpitations. Denies any numbness or tingling in hands or feet. Denies any recent fevers, infections, or recent hospitalizations. Patient reports appetite at 75% and energy level at 25%. She has no problems maintaining her weight at this time.    REVIEW OF SYSTEMS:  Review of Systems  Gastrointestinal: Positive for constipation and nausea.  All other systems reviewed and are negative.    PAST MEDICAL/SURGICAL HISTORY:  Past Medical History:  Diagnosis Date  . Allergy    seasonal  . Anxiety   . Arthritis   . Balance problem   . Brain aneurysm    2 mm ACA region aneurysm by 09/21/15 MRA  . Breast cancer ([place])   . Breast cancer of lower-inner quadrant of right female breast ([place]) 08/26/2015  . Chronic kidney disease    acute renal failure one occasion  . Complication of anesthesia    heart rate drops and O2 sats drop  . Constipation   . Depression   . Diplopia 10/29/2017  . Dizziness 10/29/2017  . Elevated liver function tests   . Family history of adverse reaction to anesthesia    mom has n/v  . Family history of breast cancer   . Family history of colon cancer   . Fibromyalgia   . Fibromyalgia   . Gait abnormality 10/29/2017  . Genetic testing 09/08/2015   Negative genetic testing on the Breast/High Moderate Risk panel. The Breast High/Moderate Risk gene panel offered by [facility] includes sequencing and deletion/duplication analysis of the following 9 genes: ATM, BRCA1, BRCA2, CDH1, CHEK2, PALB2, PTEN, STK11, and TP53. The report date is September 08, 2015.  The Rest of the Comprehensive Cancer panel has  been reflexed and will be available in 2-3 weeks.  POLD1 c.208G>T VUS found on the Remainder of the Comprehensive cancer panel.  The Comprehensive Cancer Panel offered by [facility] includes sequencing and/or deletion duplication testing of the following 32 genes: APC, ATM, AXIN2, BARD1, BMPR1A, BRCA1, BRCA2, BRIP1, CDH1, CDK4, CDKN2A, CHEK2, EPCAM, FANCC, MLH1, MSH2, MSH6, MUTYH, NBN, PALB2, PMS2, POLD1, POLE, PTEN, RAD51C, RAD51D, SCG5/GREM1, SMAD4, STK11, TP53, VHL, and XRCC2.   The report date is 09/12/2015.   Marland Kitchen GERD (gastroesophageal reflux disease)    none recent  . H/O acute renal failure 09/22/15   admitted to [facility]  . Headache    occipital and temporal  . History of hiatal hernia   . Hyperlipemia   . Hyperlipidemia   . Hypertension   . Irritable bowel syndrome (IBS)   . MGUS (monoclonal gammopathy of unknown significance) 03/15/2016  . Neuromuscular disorder ([facility])    fibromyalgia  . Occasional tremors   . OSA (obstructive sleep apnea)    uses cpap with humidification- auto   . Personal history of radiation therapy   . PONV (postoperative nausea and vomiting)   . Renal cell carcinoma ([place])   . Tremor, essential 10/29/2017  Past Surgical History:  Procedure Laterality Date  . ABDOMINAL HYSTERECTOMY     adenomyosis  . ABDOMINAL HYSTERECTOMY    . BLADDER SURGERY    . BONE GRAFT HIP ILIAC CREST    . BREAST BIOPSY Bilateral   . BREAST LUMPECTOMY    . BREAST SURGERY    . CHOLECYSTECTOMY    . FRACTURE SURGERY Right    wrist  . KNEE ARTHROSCOPY    . KNEE ARTHROSCOPY W/ ACL RECONSTRUCTION     right  . RADIOACTIVE SEED GUIDED PARTIAL MASTECTOMY WITH AXILLARY SENTINEL LYMPH NODE BIOPSY Right 10/13/2015   Procedure: RADIOACTIVE SEED GUIDED PARTIAL MASTECTOMY WITH AXILLARY SENTINEL LYMPH NODE BIOPSY;  Surgeon: Erroll Luna, MD;  Location: [facility];  Service: General;  Laterality: Right;  . right carpal and cubital tunnel release     . ROBOTIC ASSITED PARTIAL NEPHRECTOMY Left  06/19/2018   Procedure: XI ROBOTIC ASSITED PARTIAL NEPHRECTOMY;  Surgeon: Ardis Hughs, MD;  Location: [facility];  Service: Urology;  Laterality: Left;  . WRIST SURGERY       SOCIAL HISTORY:  Social History   Socioeconomic History  . Marital status: Married    Spouse name: Psychologist, clinical  . Number of children: 2  . Years of education: 33  . Highest education level: Not on file  Occupational History  . Occupation: physician office  Social Needs  . Financial resource strain: Not on file  . Food insecurity:    Worry: Not on file    Inability: Not on file  . Transportation needs:    Medical: Not on file    Non-medical: Not on file  Tobacco Use  . Smoking status: Never Smoker  . Smokeless tobacco: Never Used  Substance and Sexual Activity  . Alcohol use: Never    Frequency: Never    Comment: maybe one a year  . Drug use: Never  . Sexual activity: Yes    Birth control/protection: Surgical  Lifestyle  . Physical activity:    Days per week: Not on file    Minutes per session: Not on file  . Stress: Not on file  Relationships  . Social connections:    Talks on phone: Not on file    Gets together: Not on file    Attends religious service: Not on file    Active member of club or organization: Not on file    Attends meetings of clubs or organizations: Not on file    Relationship status: Not on file  . Intimate partner violence:    Fear of current or ex partner: Not on file    Emotionally abused: Not on file    Physically abused: Not on file    Forced sexual activity: Not on file  Other Topics Concern  . Not on file  Social History Narrative   ** Merged History Encounter **       Lives with husband Husband on disability Children grown and moved worries about family illness- mother and sister Caffeine use:      FAMILY HISTORY:  Family History  Problem Relation Age of Onset  . Hypertension Mother   . Autoimmune disease Mother        lichen planus  . Eczema Mother     . Parkinson's disease Mother   . Mental illness Mother        bipolar  . Heart disease Maternal Aunt   . Lung cancer Paternal Aunt   . Breast cancer Paternal Aunt  dx in his 52s  . Parkinsonism Paternal Aunt   . Lung cancer Paternal Grandfather   . Colon cancer Paternal Uncle        dx in his 37s  . Colon cancer Paternal Uncle        dx in his 18s  . Melanoma Father 36  . Psoriasis Father   . Hypertension Father   . Heart disease Father   . Breast cancer Sister        dx in her 83s  . Lupus Sister   . COPD Maternal Uncle   . Skin cancer Maternal Uncle   . Colon cancer Paternal Uncle        dx in his 81s  . Mental illness Son        bipolar  . Polycystic ovary syndrome Other     CURRENT MEDICATIONS:  Outpatient Encounter Medications as of 12/01/2018  Medication Sig  . calcium carbonate (TUMS - DOSED IN MG ELEMENTAL CALCIUM) 500 MG chewable tablet Chew 1 tablet (200 mg of elemental calcium total) by mouth 2 (two) times daily with breakfast and lunch.  . cyclobenzaprine (FLEXERIL) 10 MG tablet TAKE 1 TABLET BY MOUTH THREE TIMES DAILY FOR MUSCLE SPASM  . DILT-XR 120 MG 24 hr capsule Take 120 mg by mouth daily.   Marland Kitchen doxycycline (VIBRA-TABS) 100 MG tablet Take 100 mg by mouth 2 (two) times daily.  Marland Kitchen FLUoxetine (PROZAC) 20 MG capsule Take 60 mg by mouth daily.  Marland Kitchen HYDROcodone-acetaminophen (NORCO) 7.5-325 MG tablet Take 1 tablet by mouth 3 (three) times daily as needed. for pain  . losartan (COZAAR) 25 MG tablet Take 25 mg by mouth daily.  Marland Kitchen OVER THE COUNTER MEDICATION Take 1 capsule by mouth daily as needed. Diphenhydramate Over The Counter Anti-emetic  . pramipexole (MIRAPEX) 0.125 MG tablet One tablet at dinnertime and 2 at bedtime  . pravastatin (PRAVACHOL) 10 MG tablet Take 10 mg by mouth daily.  Marland Kitchen topiramate (TOPAMAX) 25 MG tablet Take 1 tablet (25 mg total) by mouth See admin instructions. Take one in the morning and 2 in the evening  . Vitamin D, Ergocalciferol,  (DRISDOL) 50000 units CAPS capsule Take 50,000 Units by mouth every 7 (seven) days. Take 1 tablet (50,000 units) by mouth every Thursday  . dicyclomine (BENTYL) 20 MG tablet Take 20 mg by mouth as needed.   . furosemide (LASIX) 20 MG tablet Take 1 tablet (20 mg total) by mouth daily as needed for edema. (Patient not taking: Reported on 12/01/2018)  . [DISCONTINUED] hydrOXYzine (ATARAX/VISTARIL) 25 MG tablet Take 25 mg by mouth every 6 (six) hours as needed.  . [DISCONTINUED] traMADol (ULTRAM) 50 MG tablet Take 1 tablet (50 mg total) by mouth every 6 (six) hours as needed.   No facility-administered encounter medications on file as of 12/01/2018.     ALLERGIES:  Allergies  Allergen Reactions  . Ramipril Nausea And Vomiting and Other (See Comments)    Ramipril--Caused Pancreatitis  . Shrimp [Shellfish Allergy] Anaphylaxis and Swelling  . Minocycline Hcl Hives  . Altace [Ramipril] Nausea And Vomiting and Nausea Only    Drug induced pancreatitis   . Lyrica [Pregabalin] Nausea And Vomiting  . Other     ADHESIVE GLUE USED FOR INCISIONS  . Adhesive [Tape] Rash    Please use paper tape Patient is allergic to many adhesives   . Bactrim [Sulfamethoxazole-Trimethoprim] Other (See Comments)    May have caused kidney issues  . Drixoral [Brompheniramine-Pseudoeph] Rash  . Erythromycin  Other (See Comments)    GI- Upset / severe abdominal pain  . Minocin [Minocycline Hcl] Nausea And Vomiting and Rash  . Septra [Sulfamethoxazole-Trimethoprim] Rash  . Sinutab [Chlorphen-Pseudoephed-Apap] Rash     PHYSICAL EXAM:  ECOG Performance status: 1  Vitals:   12/01/18 1400  BP: 125/80  Pulse: 75  Resp: 16  Temp: 98.1 F (36.7 C)  SpO2: 99%   Filed Weights   12/01/18 1400  Weight: 233 lb 4 oz (105.8 kg)    Physical Exam Constitutional:      Appearance: Normal appearance. She is normal weight.  Cardiovascular:     Rate and Rhythm: Normal rate and regular rhythm.     Heart sounds: Normal  heart sounds.  Pulmonary:     Effort: Pulmonary effort is normal.     Breath sounds: Normal breath sounds.  Abdominal:     General: Abdomen is flat.     Palpations: Abdomen is soft.  Musculoskeletal: Normal range of motion.  Skin:    General: Skin is warm and dry.  Neurological:     Mental Status: She is alert and oriented to person, place, and time. Mental status is at baseline.  Psychiatric:        Mood and Affect: Mood normal.        Behavior: Behavior normal.        Thought Content: Thought content normal.        Judgment: Judgment normal.   Breast: No palpable masses, no skin changes or nipple discharge, no adenopathy.   LABORATORY DATA:  I have reviewed the labs as listed.  CBC    Component Value Date/Time   WBC 6.2 11/24/2018 1216   RBC 4.29 11/24/2018 1216   HGB 12.7 11/24/2018 1216   HGB 15.5 08/31/2015 1217   HCT 39.3 11/24/2018 1216   HCT 45.4 08/31/2015 1217   PLT 215 11/24/2018 1216   PLT 253 08/31/2015 1217   MCV 91.6 11/24/2018 1216   MCV 95.0 08/31/2015 1217   MCH 29.6 11/24/2018 1216   MCHC 32.3 11/24/2018 1216   RDW 12.8 11/24/2018 1216   RDW 13.5 08/31/2015 1217   LYMPHSABS 1.7 11/24/2018 1216   LYMPHSABS 2.3 08/31/2015 1217   MONOABS 0.5 11/24/2018 1216   MONOABS 0.5 08/31/2015 1217   EOSABS 0.2 11/24/2018 1216   EOSABS 0.1 08/31/2015 1217   BASOSABS 0.0 11/24/2018 1216   BASOSABS 0.0 08/31/2015 1217   CMP Latest Ref Rng & Units 11/24/2018 08/01/2018 07/24/2018  Glucose 70 - 99 mg/dL 121(H) 95 129(H)  BUN 6 - 20 mg/dL 21(H) 12 14  Creatinine 0.44 - 1.00 mg/dL 0.86 0.79 0.77  Sodium 135 - 145 mmol/L 137 140 139  Potassium 3.5 - 5.1 mmol/L 3.4(L) 3.4(L) 3.4(L)  Chloride 98 - 111 mmol/L 108 109 109  CO2 22 - 32 mmol/L 20(L) 21(L) 22  Calcium 8.9 - 10.3 mg/dL 8.8(L) 9.3 9.0  Total Protein 6.5 - 8.1 g/dL 7.2 8.1 7.7  Total Bilirubin 0.3 - 1.2 mg/dL 0.4 0.6 0.6  Alkaline Phos 38 - 126 U/L 55 65 61  AST 15 - 41 U/L _0 ALT 0 - 44 U/L _1 DIAGNOSTIC IMAGING:  I have independently reviewed the scans and discussed with the patient.   I have reviewed Francene Finders, NP's note and agree with the documentation.  I personally performed a face-to-face visit, made revisions and my assessment and plan is as follows.  ASSESSMENT & PLAN:   Breast cancer of lower-inner quadrant of right female breast ([place]) 1.  Right breast high-grade DCIS: - Status post right lumpectomy and sentinel lymph node biopsy on 10/13/2015 by Dr. Brantley Stage.  0.4 cm high-grade DCIS, 0/1 lymph nodes positive, ER/PR negative, PT ISP N0. -He underwent radiation therapy in the adjuvant setting. -On 05/01/2017 she underwent left breast biopsy UOQ-fibroadenoma.  Left breast UIQ biopsy showed fibrocystic change. -Today's physical examination did not show any palpable masses.  Lumpectomy scar at the inferior margin of area of is well-healed. -Mammogram on 03/24/2018 was BI-RADS Category 2.  Recent genetic testing was negative. -We will schedule her for mammogram in June.  2.  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 -Bone scan on 08/07/2018 was negative. -She has follow-up with her urologist for a CT scan of the abdomen.  3.  Elevated kappa light chains: - SPEP on 07/24/2018 was negative.  Free kappa light chains were mildly elevated at 27 and ratio of 1.64. - Bone marrow biopsy on 02/29/2016 shows mildly hypocellular bone marrow with mild polytypic plasmacytosis (5 to 10%). -We plan to repeat her free light chain ratio, SPEP and immunofixation prior to next visit in 6 months.      Orders placed this encounter:  Orders Placed This Encounter  Procedures  . MM Digital Diagnostic Bilat  . Lactate dehydrogenase  . Protein electrophoresis, serum  . Kappa/lambda light chains  . CBC with Differential/Platelet  . Comprehensive metabolic  panel  . IgG, IgA, IgM      Derek Jack, MD [place] [phone]

## 2018-12-02 ENCOUNTER — Ambulatory Visit: Payer: BLUE CROSS/BLUE SHIELD | Admitting: Neurology

## 2018-12-02 ENCOUNTER — Encounter: Payer: Self-pay | Admitting: Neurology

## 2018-12-02 DIAGNOSIS — M545 Low back pain, unspecified: Secondary | ICD-10-CM

## 2018-12-02 DIAGNOSIS — G8929 Other chronic pain: Secondary | ICD-10-CM

## 2018-12-02 HISTORY — DX: Low back pain, unspecified: M54.50

## 2018-12-02 NOTE — Progress Notes (Signed)
Reason for visit: Chronic low back pain, fibromyalgia  Sarah Cooley is an 53 y.o. female  History of present illness:  Sarah Cooley is a 53 year old right-handed white female with a history of chronic arthralgias, chronic low back pain, and chronic fatigue.  The patient has undergone EMG and nerve conduction study of the legs that were unremarkable.  She has been on Lyrica, gabapentin, Cymbalta, and Flexeril in the past without tolerance or benefit with her low back pain.  The patient indicates that the back pain is more severe when she is lying down in bed.  The patient has been found to have a blood test positive for Lyme disease, the test in January 2018 was negative.  The patient has been seen through infectious disease, the patient is on her second 30-day course of doxycycline, the first course transiently improved her arthralgias.  The patient returns for an evaluation.  Past Medical History:  Diagnosis Date  . Allergy    seasonal  . Anxiety   . Arthritis   . Balance problem   . Brain aneurysm    2 mm ACA region aneurysm by 09/21/15 MRA  . Breast cancer ([place])   . Breast cancer of lower-inner quadrant of right female breast ([place]) 08/26/2015  . Chronic kidney disease    acute renal failure one occasion  . Chronic low back pain 12/02/2018  . Complication of anesthesia    heart rate drops and O2 sats drop  . Constipation   . Depression   . Diplopia 10/29/2017  . Dizziness 10/29/2017  . Elevated liver function tests   . Family history of adverse reaction to anesthesia    mom has n/v  . Family history of breast cancer   . Family history of colon cancer   . Fibromyalgia   . Fibromyalgia   . Gait abnormality 10/29/2017  . Genetic testing 09/08/2015   Negative genetic testing on the Breast/High Moderate Risk panel. The Breast High/Moderate Risk gene panel offered by [facility] includes sequencing and deletion/duplication analysis of the following 9 genes: ATM, BRCA1, BRCA2, CDH1,  CHEK2, PALB2, PTEN, STK11, and TP53. The report date is September 08, 2015.  The Rest of the Comprehensive Cancer panel has been reflexed and will be available in 2-3 weeks.  POLD1 c.208G>T VUS found on the Remainder of the Comprehensive cancer panel.  The Comprehensive Cancer Panel offered by [facility] includes sequencing and/or deletion duplication testing of the following 32 genes: APC, ATM, AXIN2, BARD1, BMPR1A, BRCA1, BRCA2, BRIP1, CDH1, CDK4, CDKN2A, CHEK2, EPCAM, FANCC, MLH1, MSH2, MSH6, MUTYH, NBN, PALB2, PMS2, POLD1, POLE, PTEN, RAD51C, RAD51D, SCG5/GREM1, SMAD4, STK11, TP53, VHL, and XRCC2.   The report date is 09/12/2015.   Marland Kitchen GERD (gastroesophageal reflux disease)    none recent  . H/O acute renal failure 09/22/15   admitted to [facility]  . Headache    occipital and temporal  . History of hiatal hernia   . Hyperlipemia   . Hyperlipidemia   . Hypertension   . Irritable bowel syndrome (IBS)   . MGUS (monoclonal gammopathy of unknown significance) 03/15/2016  . Neuromuscular disorder (HCC)    fibromyalgia  . Occasional tremors   . OSA (obstructive sleep apnea)    uses cpap with humidification- auto   . Personal history of radiation therapy   . PONV (postoperative nausea and vomiting)   . Renal cell carcinoma ([place])   . Tremor, essential 10/29/2017    Past Surgical History:  Procedure Laterality Date  .  ABDOMINAL HYSTERECTOMY     adenomyosis  . ABDOMINAL HYSTERECTOMY    . BLADDER SURGERY    . BONE GRAFT HIP ILIAC CREST    . BREAST BIOPSY Bilateral   . BREAST LUMPECTOMY    . BREAST SURGERY    . CHOLECYSTECTOMY    . FRACTURE SURGERY Right    wrist  . KNEE ARTHROSCOPY    . KNEE ARTHROSCOPY W/ ACL RECONSTRUCTION     right  . RADIOACTIVE SEED GUIDED PARTIAL MASTECTOMY WITH AXILLARY SENTINEL LYMPH NODE BIOPSY Right 10/13/2015   Procedure: RADIOACTIVE SEED GUIDED PARTIAL MASTECTOMY WITH AXILLARY SENTINEL LYMPH NODE BIOPSY;  Surgeon: Erroll Luna, MD;  Location: [facility];   Service: General;  Laterality: Right;  . right carpal and cubital tunnel release     . ROBOTIC ASSITED PARTIAL NEPHRECTOMY Left 06/19/2018   Procedure: XI ROBOTIC ASSITED PARTIAL NEPHRECTOMY;  Surgeon: Ardis Hughs, MD;  Location: [facility];  Service: Urology;  Laterality: Left;  . WRIST SURGERY      Family History  Problem Relation Age of Onset  . Hypertension Mother   . Autoimmune disease Mother        lichen planus  . Eczema Mother   . Parkinson's disease Mother   . Mental illness Mother        bipolar  . Heart disease Maternal Aunt   . Lung cancer Paternal Aunt   . Breast cancer Paternal Aunt        dx in his 44s  . Parkinsonism Paternal Aunt   . Lung cancer Paternal Grandfather   . Colon cancer Paternal Uncle        dx in his 63s  . Colon cancer Paternal Uncle        dx in his 26s  . Melanoma Father 21  . Psoriasis Father   . Hypertension Father   . Heart disease Father   . Breast cancer Sister        dx in her 104s  . Lupus Sister   . COPD Maternal Uncle   . Skin cancer Maternal Uncle   . Colon cancer Paternal Uncle        dx in his 28s  . Mental illness Son        bipolar  . Polycystic ovary syndrome Other     Social history:  reports that she has never smoked. She has never used smokeless tobacco. She reports that she does not drink alcohol or use drugs.    Allergies  Allergen Reactions  . Ramipril Nausea And Vomiting and Other (See Comments)    Ramipril--Caused Pancreatitis  . Shrimp [Shellfish Allergy] Anaphylaxis and Swelling  . Minocycline Hcl Hives  . Altace [Ramipril] Nausea And Vomiting and Nausea Only    Drug induced pancreatitis   . Lyrica [Pregabalin] Nausea And Vomiting  . Other     ADHESIVE GLUE USED FOR INCISIONS  . Adhesive [Tape] Rash    Please use paper tape Patient is allergic to many adhesives   . Bactrim [Sulfamethoxazole-Trimethoprim] Other (See Comments)    May have caused kidney issues  . Drixoral [Brompheniramine-Pseudoeph]  Rash  . Erythromycin Other (See Comments)    GI- Upset / severe abdominal pain  . Minocin [Minocycline Hcl] Nausea And Vomiting and Rash  . Septra [Sulfamethoxazole-Trimethoprim] Rash  . Sinutab [Chlorphen-Pseudoephed-Apap] Rash    Medications:  Prior to Admission medications   Medication Sig Start Date End Date Taking? Authorizing Provider  calcium carbonate (TUMS - DOSED IN MG ELEMENTAL  CALCIUM) 500 MG chewable tablet Chew 1 tablet (200 mg of elemental calcium total) by mouth 2 (two) times daily with breakfast and lunch. 05/29/18  Yes Johnson, Clanford L, MD  cyclobenzaprine (FLEXERIL) 10 MG tablet TAKE 1 TABLET BY MOUTH THREE TIMES DAILY FOR MUSCLE SPASM 11/20/18  Yes [provider]  dicyclomine (BENTYL) 20 MG tablet Take 20 mg by mouth as needed.    Yes [provider]  DILT-XR 120 MG 24 hr capsule Take 120 mg by mouth daily.  08/12/18  Yes [provider]  doxycycline (VIBRA-TABS) 100 MG tablet Take 100 mg by mouth 2 (two) times daily. 11/19/18  Yes [provider]  FLUoxetine (PROZAC) 20 MG capsule Take 60 mg by mouth daily. 06/19/18  Yes [provider]  HYDROcodone-acetaminophen (NORCO) 7.5-325 MG tablet Take 1 tablet by mouth 3 (three) times daily as needed. for pain 11/21/18  Yes [provider]  losartan (COZAAR) 25 MG tablet Take 25 mg by mouth daily. 06/10/18  Yes [provider]  OVER THE COUNTER MEDICATION Take 1 capsule by mouth daily as needed. Diphenhydramate Over The Counter Anti-emetic   Yes [provider]  pramipexole (MIRAPEX) 0.125 MG tablet One tablet at dinnertime and 2 at bedtime 07/23/18  Yes Kathrynn Ducking, MD  pravastatin (PRAVACHOL) 10 MG tablet Take 10 mg by mouth daily.   Yes [provider]  topiramate (TOPAMAX) 25 MG tablet Take 1 tablet (25 mg total) by mouth See admin instructions. Take one in the morning and 2 in the evening 09/11/18  Yes Kathrynn Ducking, MD  Vitamin D,  Ergocalciferol, (DRISDOL) 50000 units CAPS capsule Take 50,000 Units by mouth every 7 (seven) days. Take 1 tablet (50,000 units) by mouth every Thursday   Yes [provider]  furosemide (LASIX) 20 MG tablet Take 1 tablet (20 mg total) by mouth daily as needed for edema. Patient not taking: Reported on 12/01/2018 06/11/18 09/09/18  Arnoldo Lenis, MD    ROS:  Out of a complete 14 system review of symptoms, the patient complains only of the following symptoms, and all other reviewed systems are negative.  Fatigue Hearing loss, ringing in the ears Blurred vision Palpitations of the heart Flushing Abdominal pain, constipation, nausea Insomnia Joint pain, back pain, aching muscles, walking difficulty, neck pain, neck stiffness Weakness, tremors Decreased concentration, depression, anxiety  Blood pressure 128/86, pulse 73, height 5' 8.25" (1.734 m), weight 235 lb 5 oz (106.7 kg), SpO2 96 %.  Physical Exam  General: The patient is alert and cooperative at the time of the examination.  The patient is markedly obese.  Neuromuscular: The patient is able to flex the low back to about 90 degrees.  Skin: No significant peripheral edema is noted.   Neurologic Exam  Mental status: The patient is alert and oriented x 3 at the time of the examination. The patient has apparent normal recent and remote memory, with an apparently normal attention span and concentration ability.   Cranial nerves: Facial symmetry is present. Speech is normal, no aphasia or dysarthria is noted. Extraocular movements are full. Visual fields are full.  Motor: The patient has good strength in all 4 extremities.  Sensory examination: Soft touch sensation is symmetric on the face, arms, and legs.  Coordination: The patient has good finger-nose-finger and heel-to-shin bilaterally.  Gait and station: The patient has a normal gait. Tandem gait is slightly unsteady. Romberg is unsteady, the patient has a  tendency to lean backwards. No  drift is seen.  Reflexes: Deep tendon reflexes are symmetric.   Assessment/Plan:  1.  Obesity  2.  Fibromyalgia  3.  Chronic low back pain, radiation to the legs, left greater than right  4.  Lyme disease  The patient is on antibiotics for her Lyme disease.  The patient has been seen through infectious disease, they did not recommend lumbar puncture.  The patient will be sent for MRI of the lumbar spine, if there are structural abnormalities, the patient may be considered for an epidural steroid injection.  The patient will follow-up in 6 months.  Jill Alexanders MD 12/02/2018 8:37 AM  [facility] [address] [place], [place] [address]  Phone [phone] Fax [phone]

## 2018-12-03 ENCOUNTER — Telehealth: Payer: Self-pay | Admitting: Neurology

## 2018-12-03 NOTE — Telephone Encounter (Signed)
lvm for pt to call back about scheduling mri  [employer] Auth: [id number] (exp. 12/03/18 to 01/01/19)

## 2018-12-03 NOTE — Telephone Encounter (Signed)
I spoke to the patient she is scheduled for 12/30/18 at [facility].

## 2018-12-03 NOTE — Telephone Encounter (Signed)
Pt returned Emily's call

## 2018-12-10 DIAGNOSIS — Z0289 Encounter for other administrative examinations: Secondary | ICD-10-CM

## 2018-12-15 DIAGNOSIS — H5010 Unspecified exotropia: Secondary | ICD-10-CM | POA: Diagnosis not present

## 2018-12-19 ENCOUNTER — Encounter: Payer: Self-pay | Admitting: Cardiology

## 2018-12-19 ENCOUNTER — Ambulatory Visit (INDEPENDENT_AMBULATORY_CARE_PROVIDER_SITE_OTHER): Payer: BLUE CROSS/BLUE SHIELD | Admitting: Cardiology

## 2018-12-19 VITALS — BP 130/80 | HR 87 | Ht 68.25 in | Wt 232.0 lb

## 2018-12-19 DIAGNOSIS — E782 Mixed hyperlipidemia: Secondary | ICD-10-CM | POA: Diagnosis not present

## 2018-12-19 DIAGNOSIS — I10 Essential (primary) hypertension: Secondary | ICD-10-CM | POA: Diagnosis not present

## 2018-12-19 NOTE — Progress Notes (Signed)
Clinical Summary Sarah Cooley is a 53 y.o.female     1. HTN - admission 06/2015 with severe HTN, mild trop elevation to 0.19. EKG was nonacute. Lexiscan at the time showed no ischemia, echo LVEF 60-65% with no WMAs.  - compliant with meds    2. Hyperlipidemia - she is compliant with statin, labs followed by pcp    3. Dilated main pulmonary artery - noted on 12/2017 CT chest - prior echo in 2016 unable to measure PASP, inadequate TR jet. Normal RV. 07/2018 echo LVEF 55-60%, no WMAs, normal RV, no TR  - no other supportive signs of pulmonary HTN   4. OSA on cpap  5. Chest pain - 08/2018 nuclear stress no ischeiima.  No recent symptoms. She does have a histoyr of fibromyalgia  6. Lyme disease - on doxycycline   7. Palpitations - short burts, 4-5 times a week - limited caffeine - better with dilt, have essentially resovled since restarting    Past Medical History:  Diagnosis Date  . Allergy    seasonal  . Anxiety   . Arthritis   . Balance problem   . Brain aneurysm    2 mm ACA region aneurysm by 09/21/15 MRA  . Breast cancer ([place])   . Breast cancer of lower-inner quadrant of right female breast ([place]) 08/26/2015  . Chronic kidney disease    acute renal failure one occasion  . Chronic low back pain 12/02/2018  . Complication of anesthesia    heart rate drops and O2 sats drop  . Constipation   . Depression   . Diplopia 10/29/2017  . Dizziness 10/29/2017  . Elevated liver function tests   . Family history of adverse reaction to anesthesia    mom has n/v  . Family history of breast cancer   . Family history of colon cancer   . Fibromyalgia   . Fibromyalgia   . Gait abnormality 10/29/2017  . Genetic testing 09/08/2015   Negative genetic testing on the Breast/High Moderate Risk panel. The Breast High/Moderate Risk gene panel offered by [facility] includes sequencing and deletion/duplication analysis of the following 9 genes: ATM, BRCA1, BRCA2, CDH1, CHEK2,  PALB2, PTEN, STK11, and TP53. The report date is September 08, 2015.  The Rest of the Comprehensive Cancer panel has been reflexed and will be available in 2-3 weeks.  POLD1 c.208G>T VUS found on the Remainder of the Comprehensive cancer panel.  The Comprehensive Cancer Panel offered by [facility] includes sequencing and/or deletion duplication testing of the following 32 genes: APC, ATM, AXIN2, BARD1, BMPR1A, BRCA1, BRCA2, BRIP1, CDH1, CDK4, CDKN2A, CHEK2, EPCAM, FANCC, MLH1, MSH2, MSH6, MUTYH, NBN, PALB2, PMS2, POLD1, POLE, PTEN, RAD51C, RAD51D, SCG5/GREM1, SMAD4, STK11, TP53, VHL, and XRCC2.   The report date is 09/12/2015.   Marland Kitchen GERD (gastroesophageal reflux disease)    none recent  . H/O acute renal failure 09/22/15   admitted to [facility]  . Headache    occipital and temporal  . History of hiatal hernia   . Hyperlipemia   . Hyperlipidemia   . Hypertension   . Irritable bowel syndrome (IBS)   . MGUS (monoclonal gammopathy of unknown significance) 03/15/2016  . Neuromuscular disorder (HCC)    fibromyalgia  . Occasional tremors   . OSA (obstructive sleep apnea)    uses cpap with humidification- auto   . Personal history of radiation therapy   . PONV (postoperative nausea and vomiting)   . Renal cell carcinoma ([place])   . Tremor,  essential 10/29/2017     Allergies  Allergen Reactions  . Ramipril Nausea And Vomiting and Other (See Comments)    Ramipril--Caused Pancreatitis  . Shrimp [Shellfish Allergy] Anaphylaxis and Swelling  . Minocycline Hcl Hives  . Altace [Ramipril] Nausea And Vomiting and Nausea Only    Drug induced pancreatitis   . Lyrica [Pregabalin] Nausea And Vomiting  . Other     ADHESIVE GLUE USED FOR INCISIONS  . Adhesive [Tape] Rash    Please use paper tape Patient is allergic to many adhesives   . Bactrim [Sulfamethoxazole-Trimethoprim] Other (See Comments)    May have caused kidney issues  . Drixoral [Brompheniramine-Pseudoeph] Rash  . Erythromycin Other (See  Comments)    GI- Upset / severe abdominal pain  . Minocin [Minocycline Hcl] Nausea And Vomiting and Rash  . Septra [Sulfamethoxazole-Trimethoprim] Rash  . Sinutab [Chlorphen-Pseudoephed-Apap] Rash     Current Outpatient Medications  Medication Sig Dispense Refill  . calcium carbonate (TUMS - DOSED IN MG ELEMENTAL CALCIUM) 500 MG chewable tablet Chew 1 tablet (200 mg of elemental calcium total) by mouth 2 (two) times daily with breakfast and lunch.    . cyclobenzaprine (FLEXERIL) 10 MG tablet TAKE 1 TABLET BY MOUTH THREE TIMES DAILY FOR MUSCLE SPASM    . dicyclomine (BENTYL) 20 MG tablet Take 20 mg by mouth as needed.     Marland Kitchen DILT-XR 120 MG 24 hr capsule Take 120 mg by mouth daily.   0  . doxycycline (VIBRA-TABS) 100 MG tablet Take 100 mg by mouth 2 (two) times daily.    Marland Kitchen FLUoxetine (PROZAC) 20 MG capsule Take 60 mg by mouth daily.  12  . furosemide (LASIX) 20 MG tablet Take 1 tablet (20 mg total) by mouth daily as needed for edema. (Patient not taking: Reported on 12/01/2018) 90 tablet 3  . HYDROcodone-acetaminophen (NORCO) 7.5-325 MG tablet Take 1 tablet by mouth 3 (three) times daily as needed. for pain    . losartan (COZAAR) 25 MG tablet Take 25 mg by mouth daily.  0  . OVER THE COUNTER MEDICATION Take 1 capsule by mouth daily as needed. Diphenhydramate Over The Counter Anti-emetic    . pramipexole (MIRAPEX) 0.125 MG tablet One tablet at dinnertime and 2 at bedtime 90 tablet 3  . pravastatin (PRAVACHOL) 10 MG tablet Take 10 mg by mouth daily.    Marland Kitchen topiramate (TOPAMAX) 25 MG tablet Take 1 tablet (25 mg total) by mouth See admin instructions. Take one in the morning and 2 in the evening 270 tablet 3  . Vitamin D, Ergocalciferol, (DRISDOL) 50000 units CAPS capsule Take 50,000 Units by mouth every 7 (seven) days. Take 1 tablet (50,000 units) by mouth every Thursday     No current facility-administered medications for this visit.      Past Surgical History:  Procedure Laterality Date  .  ABDOMINAL HYSTERECTOMY     adenomyosis  . ABDOMINAL HYSTERECTOMY    . BLADDER SURGERY    . BONE GRAFT HIP ILIAC CREST    . BREAST BIOPSY Bilateral   . BREAST LUMPECTOMY    . BREAST SURGERY    . CHOLECYSTECTOMY    . FRACTURE SURGERY Right    wrist  . KNEE ARTHROSCOPY    . KNEE ARTHROSCOPY W/ ACL RECONSTRUCTION     right  . RADIOACTIVE SEED GUIDED PARTIAL MASTECTOMY WITH AXILLARY SENTINEL LYMPH NODE BIOPSY Right 10/13/2015   Procedure: RADIOACTIVE SEED GUIDED PARTIAL MASTECTOMY WITH AXILLARY SENTINEL LYMPH NODE BIOPSY;  Surgeon: Erroll Luna,  MD;  Location: [facility];  Service: General;  Laterality: Right;  . right carpal and cubital tunnel release     . ROBOTIC ASSITED PARTIAL NEPHRECTOMY Left 06/19/2018   Procedure: XI ROBOTIC ASSITED PARTIAL NEPHRECTOMY;  Surgeon: Ardis Hughs, MD;  Location: [facility];  Service: Urology;  Laterality: Left;  . WRIST SURGERY       Allergies  Allergen Reactions  . Ramipril Nausea And Vomiting and Other (See Comments)    Ramipril--Caused Pancreatitis  . Shrimp [Shellfish Allergy] Anaphylaxis and Swelling  . Minocycline Hcl Hives  . Altace [Ramipril] Nausea And Vomiting and Nausea Only    Drug induced pancreatitis   . Lyrica [Pregabalin] Nausea And Vomiting  . Other     ADHESIVE GLUE USED FOR INCISIONS  . Adhesive [Tape] Rash    Please use paper tape Patient is allergic to many adhesives   . Bactrim [Sulfamethoxazole-Trimethoprim] Other (See Comments)    May have caused kidney issues  . Drixoral [Brompheniramine-Pseudoeph] Rash  . Erythromycin Other (See Comments)    GI- Upset / severe abdominal pain  . Minocin [Minocycline Hcl] Nausea And Vomiting and Rash  . Septra [Sulfamethoxazole-Trimethoprim] Rash  . Sinutab [Chlorphen-Pseudoephed-Apap] Rash      Family History  Problem Relation Age of Onset  . Hypertension Mother   . Autoimmune disease Mother        lichen planus  . Eczema Mother   . Parkinson's disease Mother   . Mental  illness Mother        bipolar  . Heart disease Maternal Aunt   . Lung cancer Paternal Aunt   . Breast cancer Paternal Aunt        dx in his 45s  . Parkinsonism Paternal Aunt   . Lung cancer Paternal Grandfather   . Colon cancer Paternal Uncle        dx in his 31s  . Colon cancer Paternal Uncle        dx in his 84s  . Melanoma Father 54  . Psoriasis Father   . Hypertension Father   . Heart disease Father   . Breast cancer Sister        dx in her 20s  . Lupus Sister   . COPD Maternal Uncle   . Skin cancer Maternal Uncle   . Colon cancer Paternal Uncle        dx in his 4s  . Mental illness Son        bipolar  . Polycystic ovary syndrome Other      Social History Ms. Skufca reports that she has never smoked. She has never used smokeless tobacco. Ms. Hon reports no history of alcohol use.   Review of Systems CONSTITUTIONAL: +fatigue  HEENT: Eyes: No visual loss, blurred vision, double vision or yellow sclerae.No hearing loss, sneezing, congestion, runny nose or sore throat.  SKIN: No rash or itching.  CARDIOVASCULAR: per hpi RESPIRATORY: No shortness of breath, cough or sputum.  GASTROINTESTINAL: No anorexia, nausea, vomiting or diarrhea. No abdominal pain or blood.  GENITOURINARY: No burning on urination, no polyuria NEUROLOGICAL: No headache, dizziness, syncope, paralysis, ataxia, numbness or tingling in the extremities. No change in bowel or bladder control.  MUSCULOSKELETAL: No muscle, back pain, joint pain or stiffness.  LYMPHATICS: No enlarged nodes. No history of splenectomy.  PSYCHIATRIC: No history of depression or anxiety.  ENDOCRINOLOGIC: No reports of sweating, cold or heat intolerance. No polyuria or polydipsia.  Marland Kitchen   Physical Examination Vitals:   12/19/18  1459  BP: 130/80  Pulse: 87  SpO2: 99%   Vitals:   12/19/18 1459  Weight: 232 lb (105.2 kg)  Height: 5' 8.25" (1.734 m)    Gen: resting comfortably, no acute distress HEENT: no scleral  icterus, pupils equal round and reactive, no palptable cervical adenopathy,  CV: RRR, no m/r/g, no jvd Resp: Clear to auscultation bilaterally GI: abdomen is soft, non-tender, non-distended, normal bowel sounds, no hepatosplenomegaly MSK: extremities are warm, no edema.  Skin: warm, no rash Neuro:  no focal deficits Psych: appropriate affect   Diagnostic Studies 06/2015 echo Study Conclusions  - Left ventricle: The cavity size was normal. Wall thickness was normal. Systolic function was normal. The estimated ejection fraction was in the range of 60% to 65%. Wall motion was normal; there were no regional wall motion abnormalities. Left ventricular diastolic function parameters were normal. - Aortic valve: Valve area (VTI): 2.67 cm^2. Valve area (Vmax): 3.29 cm^2. - Atrial septum: No defect or patent foramen ovale was identified. - Technically adequate study.  06/2015 nuclear stress test  There was no ST segment deviation noted during stress after Lexiscan injection  The study is normal. There are no perfusion defects consistent with ischemia or prior infarct.  This is a low risk study.  Nuclear stress EF: 63%.   07/2018 echo Study Conclusions  - Left ventricle: The cavity size was normal. Systolic function was   normal. The estimated ejection fraction was in the range of 55%   to 60%. Wall motion was normal; there were no regional wall   motion abnormalities. - Aortic valve: Transvalvular velocity was within the normal range.   There was no stenosis. There was no regurgitation. - Mitral valve: Transvalvular velocity was within the normal range.   There was no evidence for stenosis. There was no regurgitation. - Right ventricle: The cavity size was normal. Wall thickness was   normal. Systolic function was normal. - Atrial septum: No defect or patent foramen ovale was identified   by color flow Doppler. - Tricuspid valve: There was no  regurgitation.   08/2018 carotid US IMPRESSION: 1. No significant carotid bifurcation plaque or stenosis. 2.  Antegrade bilateral vertebral arterial flow.   08/2018 nuclear stress  Blood pressure demonstrated a hypertensive response to exercise.  There was no ST segment deviation noted during stress.  The study is normal.  This is a low risk study.  The left ventricular ejection fraction is normal (55-65%).   Normal resting and stress perfusion. No ischemia or infarction EF 61%   Assessment and Plan   1. HTN - bp at goal, continue current meds  2. Hyperlipidemia - continue statin, request pcp labs  3. Dilated pulmonary artery - incidental findings on CT - echo without significant TR, cannot measure PASP. RV is normal in size and function arguing againt significant pulmonary HTN if present. Would monitor at this time, don't have strong indication for futher testing at this time, dont' see doing a RHC based on this isolated finding alone. Continue to manage her OSA.    F/u 6 months  Arnoldo Lenis, M.D.

## 2018-12-19 NOTE — Patient Instructions (Signed)
Medication Instructions:  Continue all current medications.   Follow-Up: Your physician wants you to follow up in: 6 months.  You will receive a reminder letter in the mail one-two months in advance.  If you don't receive a letter, please call our office to schedule the follow up appointment   Any Other Special Instructions Will Be Listed Below (If Applicable).  If you need a refill on your cardiac medications before your next appointment, please call your pharmacy.  

## 2018-12-25 ENCOUNTER — Other Ambulatory Visit (HOSPITAL_COMMUNITY): Payer: Self-pay | Admitting: Urology

## 2018-12-25 ENCOUNTER — Ambulatory Visit (HOSPITAL_COMMUNITY)
Admission: RE | Admit: 2018-12-25 | Discharge: 2018-12-25 | Disposition: A | Discharge status: Home / Self Care | Payer: BLUE CROSS/BLUE SHIELD | Source: Ambulatory Visit | Attending: Urology | Admitting: Urology

## 2018-12-25 DIAGNOSIS — Z853 Personal history of malignant neoplasm of breast: Secondary | ICD-10-CM | POA: Diagnosis not present

## 2018-12-25 DIAGNOSIS — Z8553 Personal history of malignant neoplasm of renal pelvis: Secondary | ICD-10-CM | POA: Diagnosis not present

## 2018-12-25 DIAGNOSIS — C642 Malignant neoplasm of left kidney, except renal pelvis: Secondary | ICD-10-CM | POA: Diagnosis not present

## 2018-12-29 DIAGNOSIS — C642 Malignant neoplasm of left kidney, except renal pelvis: Secondary | ICD-10-CM | POA: Diagnosis not present

## 2018-12-29 NOTE — Telephone Encounter (Signed)
Patient called in canceled her appt due to finical reason.

## 2018-12-30 ENCOUNTER — Other Ambulatory Visit: Payer: BLUE CROSS/BLUE SHIELD

## 2019-01-02 ENCOUNTER — Other Ambulatory Visit: Payer: Self-pay | Admitting: Neurology

## 2019-01-05 ENCOUNTER — Other Ambulatory Visit: Payer: Self-pay | Admitting: Neurology

## 2019-01-28 DIAGNOSIS — R509 Fever, unspecified: Secondary | ICD-10-CM | POA: Diagnosis not present

## 2019-01-28 DIAGNOSIS — R110 Nausea: Secondary | ICD-10-CM | POA: Diagnosis not present

## 2019-01-28 DIAGNOSIS — M797 Fibromyalgia: Secondary | ICD-10-CM | POA: Diagnosis not present

## 2019-01-28 DIAGNOSIS — I10 Essential (primary) hypertension: Secondary | ICD-10-CM | POA: Diagnosis not present

## 2019-01-28 DIAGNOSIS — E782 Mixed hyperlipidemia: Secondary | ICD-10-CM | POA: Diagnosis not present

## 2019-02-01 ENCOUNTER — Other Ambulatory Visit: Payer: Self-pay | Admitting: Neurology

## 2019-02-04 DIAGNOSIS — E559 Vitamin D deficiency, unspecified: Secondary | ICD-10-CM | POA: Diagnosis not present

## 2019-02-04 DIAGNOSIS — Z6834 Body mass index (BMI) 34.0-34.9, adult: Secondary | ICD-10-CM | POA: Diagnosis not present

## 2019-02-04 DIAGNOSIS — I272 Pulmonary hypertension, unspecified: Secondary | ICD-10-CM | POA: Diagnosis not present

## 2019-02-04 DIAGNOSIS — M791 Myalgia, unspecified site: Secondary | ICD-10-CM | POA: Diagnosis not present

## 2019-02-04 DIAGNOSIS — M545 Low back pain: Secondary | ICD-10-CM | POA: Diagnosis not present

## 2019-02-04 DIAGNOSIS — E785 Hyperlipidemia, unspecified: Secondary | ICD-10-CM | POA: Diagnosis not present

## 2019-02-04 DIAGNOSIS — A6920 Lyme disease, unspecified: Secondary | ICD-10-CM | POA: Diagnosis not present

## 2019-02-09 ENCOUNTER — Other Ambulatory Visit: Payer: Self-pay | Admitting: Internal Medicine

## 2019-02-09 DIAGNOSIS — R29818 Other symptoms and signs involving the nervous system: Secondary | ICD-10-CM

## 2019-02-09 DIAGNOSIS — E221 Hyperprolactinemia: Secondary | ICD-10-CM

## 2019-02-13 ENCOUNTER — Other Ambulatory Visit: Payer: Self-pay

## 2019-02-13 ENCOUNTER — Ambulatory Visit (HOSPITAL_COMMUNITY)
Admission: RE | Admit: 2019-02-13 | Discharge: 2019-02-13 | Disposition: A | Discharge status: Home / Self Care | Payer: BLUE CROSS/BLUE SHIELD | Source: Ambulatory Visit | Attending: Internal Medicine | Admitting: Internal Medicine

## 2019-02-13 DIAGNOSIS — E221 Hyperprolactinemia: Secondary | ICD-10-CM

## 2019-02-13 DIAGNOSIS — R29818 Other symptoms and signs involving the nervous system: Secondary | ICD-10-CM

## 2019-02-13 DIAGNOSIS — R251 Tremor, unspecified: Secondary | ICD-10-CM | POA: Diagnosis not present

## 2019-02-13 LAB — POCT I-STAT CREATININE: Creatinine, Ser: 0.8 mg/dL (ref 0.44–1.00)

## 2019-02-13 MED ORDER — GADOBUTROL 1 MMOL/ML IV SOLN
10.0000 mL | Freq: Once | INTRAVENOUS | Status: AC | PRN
  Administered 2019-02-13: 14:00:00 10 mL via INTRAVENOUS

## 2019-03-06 ENCOUNTER — Other Ambulatory Visit: Payer: Self-pay | Admitting: Neurology

## 2019-03-27 ENCOUNTER — Encounter (HOSPITAL_COMMUNITY): Payer: BLUE CROSS/BLUE SHIELD

## 2019-03-31 ENCOUNTER — Ambulatory Visit (HOSPITAL_COMMUNITY): Admission: RE | Admit: 2019-03-31 | Payer: BC Managed Care – PPO | Source: Ambulatory Visit

## 2019-03-31 ENCOUNTER — Ambulatory Visit (HOSPITAL_COMMUNITY): Payer: BC Managed Care – PPO

## 2019-03-31 ENCOUNTER — Ambulatory Visit (HOSPITAL_COMMUNITY)
Admission: RE | Admit: 2019-03-31 | Discharge: 2019-03-31 | Disposition: A | Discharge status: Home / Self Care | Payer: BC Managed Care – PPO | Source: Ambulatory Visit | Attending: Nurse Practitioner | Admitting: Nurse Practitioner

## 2019-03-31 ENCOUNTER — Other Ambulatory Visit: Payer: Self-pay

## 2019-03-31 DIAGNOSIS — C50311 Malignant neoplasm of lower-inner quadrant of right female breast: Secondary | ICD-10-CM | POA: Diagnosis not present

## 2019-03-31 DIAGNOSIS — Z171 Estrogen receptor negative status [ER-]: Secondary | ICD-10-CM | POA: Diagnosis not present

## 2019-03-31 DIAGNOSIS — R928 Other abnormal and inconclusive findings on diagnostic imaging of breast: Secondary | ICD-10-CM | POA: Diagnosis not present

## 2019-05-25 ENCOUNTER — Inpatient Hospital Stay (HOSPITAL_COMMUNITY): Payer: BC Managed Care – PPO | Attending: Hematology

## 2019-05-25 ENCOUNTER — Other Ambulatory Visit: Payer: Self-pay

## 2019-05-25 DIAGNOSIS — C50311 Malignant neoplasm of lower-inner quadrant of right female breast: Secondary | ICD-10-CM | POA: Diagnosis not present

## 2019-05-25 DIAGNOSIS — D472 Monoclonal gammopathy: Secondary | ICD-10-CM | POA: Diagnosis not present

## 2019-05-25 DIAGNOSIS — Z171 Estrogen receptor negative status [ER-]: Secondary | ICD-10-CM | POA: Insufficient documentation

## 2019-05-25 LAB — COMPREHENSIVE METABOLIC PANEL
ALT: 32 U/L (ref 0–44)
AST: 23 U/L (ref 15–41)
Albumin: 3.7 g/dL (ref 3.5–5.0)
Alkaline Phosphatase: 60 U/L (ref 38–126)
Anion gap: 9 (ref 5–15)
BUN: 21 mg/dL — ABNORMAL HIGH (ref 6–20)
CO2: 23 mmol/L (ref 22–32)
Calcium: 9 mg/dL (ref 8.9–10.3)
Chloride: 108 mmol/L (ref 98–111)
Creatinine, Ser: 0.85 mg/dL (ref 0.44–1.00)
GFR calc Af Amer: >60 mL/min (ref >60)
GFR calc non Af Amer: >60 mL/min (ref >60)
Glucose, Bld: 121 mg/dL — ABNORMAL HIGH (ref 70–99)
Potassium: 3.7 mmol/L (ref 3.5–5.1)
Sodium: 140 mmol/L (ref 135–145)
Total Bilirubin: 0.4 mg/dL (ref 0.3–1.2)
Total Protein: 6.8 g/dL (ref 6.5–8.1)

## 2019-05-25 LAB — CBC WITH DIFFERENTIAL/PLATELET
Abs Immature Granulocytes: 0.03 10*3/uL (ref 0.00–0.07)
Basophils Absolute: 0 10*3/uL (ref 0.0–0.1)
Basophils Relative: 0 %
Eosinophils Absolute: 0.2 10*3/uL (ref 0.0–0.5)
Eosinophils Relative: 3 %
HCT: 39.3 % (ref 36.0–46.0)
Hemoglobin: 12.7 g/dL (ref 12.0–15.0)
Immature Granulocytes: 1 %
Lymphocytes Relative: 28 %
Lymphs Abs: 1.7 10*3/uL (ref 0.7–4.0)
MCH: 30.2 pg (ref 26.0–34.0)
MCHC: 32.3 g/dL (ref 30.0–36.0)
MCV: 93.6 fL (ref 80.0–100.0)
Monocytes Absolute: 0.5 10*3/uL (ref 0.1–1.0)
Monocytes Relative: 9 %
Neutro Abs: 3.5 10*3/uL (ref 1.7–7.7)
Neutrophils Relative %: 59 %
Platelets: 198 10*3/uL (ref 150–400)
RBC: 4.2 MIL/uL (ref 3.87–5.11)
RDW: 13.1 % (ref 11.5–15.5)
WBC: 5.9 10*3/uL (ref 4.0–10.5)
nRBC: 0 % (ref 0.0–0.2)

## 2019-05-25 LAB — LACTATE DEHYDROGENASE: LDH: 172 U/L (ref 98–192)

## 2019-05-26 LAB — IGG, IGA, IGM
IgA: 423 mg/dL — ABNORMAL HIGH (ref 87–352)
IgG (Immunoglobin G), Serum: 986 mg/dL (ref 586–1602)
IgM (Immunoglobulin M), Srm: 62 mg/dL (ref 26–217)

## 2019-05-26 LAB — KAPPA/LAMBDA LIGHT CHAINS
Kappa free light chain: 36.6 mg/L — ABNORMAL HIGH (ref 3.3–19.4)
Kappa, lambda light chain ratio: 2.19 — ABNORMAL HIGH (ref 0.26–1.65)
Lambda free light chains: 16.7 mg/L (ref 5.7–26.3)

## 2019-05-27 DIAGNOSIS — Z0271 Encounter for disability determination: Secondary | ICD-10-CM

## 2019-05-27 LAB — PROTEIN ELECTROPHORESIS, SERUM
A/G Ratio: 1.3 (ref 0.7–1.7)
Albumin ELP: 3.8 g/dL (ref 2.9–4.4)
Alpha-1-Globulin: 0.2 g/dL (ref 0.0–0.4)
Alpha-2-Globulin: 0.7 g/dL (ref 0.4–1.0)
Beta Globulin: 1.2 g/dL (ref 0.7–1.3)
Gamma Globulin: 0.8 g/dL (ref 0.4–1.8)
Globulin, Total: 2.9 g/dL (ref 2.2–3.9)
Total Protein ELP: 6.7 g/dL (ref 6.0–8.5)

## 2019-05-31 DIAGNOSIS — J0100 Acute maxillary sinusitis, unspecified: Secondary | ICD-10-CM | POA: Diagnosis not present

## 2019-05-31 DIAGNOSIS — J029 Acute pharyngitis, unspecified: Secondary | ICD-10-CM | POA: Diagnosis not present

## 2019-06-01 ENCOUNTER — Ambulatory Visit (HOSPITAL_COMMUNITY): Payer: BLUE CROSS/BLUE SHIELD | Admitting: Hematology

## 2019-06-02 ENCOUNTER — Ambulatory Visit: Payer: BC Managed Care – PPO | Admitting: Neurology

## 2019-06-02 ENCOUNTER — Encounter: Payer: Self-pay | Admitting: Neurology

## 2019-06-02 ENCOUNTER — Other Ambulatory Visit: Payer: Self-pay

## 2019-06-02 VITALS — BP 141/90 | HR 74 | Temp 98.7°F | Ht 68.25 in | Wt 242.0 lb

## 2019-06-02 DIAGNOSIS — M5442 Lumbago with sciatica, left side: Secondary | ICD-10-CM

## 2019-06-02 DIAGNOSIS — G8929 Other chronic pain: Secondary | ICD-10-CM | POA: Diagnosis not present

## 2019-06-02 DIAGNOSIS — M5441 Lumbago with sciatica, right side: Secondary | ICD-10-CM

## 2019-06-02 MED ORDER — TOPIRAMATE 50 MG PO TABS: ORAL_TABLET | ORAL | 3 refills | Status: DC

## 2019-06-02 NOTE — Progress Notes (Signed)
Reason for visit: Fibromyalgia, headache, tremor  Sarah Cooley is an 53 y.o. female  History of present illness:  Sarah Cooley is a 53 year old right-handed white female with a history of obesity, and a history of fibromyalgia since 2004.  The patient has been seen by at least 2 different rheumatology physicians and had been given the diagnosis of fibromyalgia.  The patient recently was treated for Lyme disease, she had a total of 2-1/2 months of doxycycline.  The patient continues to have ongoing pain in the joints of the arms and legs and has ongoing fatigue issues, she is not sleeping well.  She has not responded to medication such as gabapentin, Lyrica, and Cymbalta previously.  She is on Topamax for headaches with some benefit, but she is still having 3-4 headaches a week.  She is tolerating the Topamax well at the 75 mg daily dose.  MRI of the lumbar spine was ordered on the last visit but never done because of financial concerns.  In the past, the patient has tried CBD oil without much benefit.  She returns to this office for an evaluation.  Past Medical History:  Diagnosis Date  . Allergy    seasonal  . Anxiety   . Arthritis   . Balance problem   . Brain aneurysm    2 mm ACA region aneurysm by 09/21/15 MRA  . Breast cancer ([place])   . Breast cancer of lower-inner quadrant of right female breast ([place]) 08/26/2015  . Chronic kidney disease    acute renal failure one occasion  . Chronic low back pain 12/02/2018  . Complication of anesthesia    heart rate drops and O2 sats drop  . Constipation   . Depression   . Diplopia 10/29/2017  . Dizziness 10/29/2017  . Elevated liver function tests   . Family history of adverse reaction to anesthesia    mom has n/v  . Family history of breast cancer   . Family history of colon cancer   . Fibromyalgia   . Fibromyalgia   . Gait abnormality 10/29/2017  . Genetic testing 09/08/2015   Negative genetic testing on the Breast/High Moderate Risk  panel. The Breast High/Moderate Risk gene panel offered by [facility] includes sequencing and deletion/duplication analysis of the following 9 genes: ATM, BRCA1, BRCA2, CDH1, CHEK2, PALB2, PTEN, STK11, and TP53. The report date is September 08, 2015.  The Rest of the Comprehensive Cancer panel has been reflexed and will be available in 2-3 weeks.  POLD1 c.208G>T VUS found on the Remainder of the Comprehensive cancer panel.  The Comprehensive Cancer Panel offered by [facility] includes sequencing and/or deletion duplication testing of the following 32 genes: APC, ATM, AXIN2, BARD1, BMPR1A, BRCA1, BRCA2, BRIP1, CDH1, CDK4, CDKN2A, CHEK2, EPCAM, FANCC, MLH1, MSH2, MSH6, MUTYH, NBN, PALB2, PMS2, POLD1, POLE, PTEN, RAD51C, RAD51D, SCG5/GREM1, SMAD4, STK11, TP53, VHL, and XRCC2.   The report date is 09/12/2015.   Marland Kitchen GERD (gastroesophageal reflux disease)    none recent  . H/O acute renal failure 09/22/15   admitted to [facility]  . Headache    occipital and temporal  . History of hiatal hernia   . Hyperlipemia   . Hyperlipidemia   . Hypertension   . Irritable bowel syndrome (IBS)   . MGUS (monoclonal gammopathy of unknown significance) 03/15/2016  . Neuromuscular disorder ([facility])    fibromyalgia  . Occasional tremors   . OSA (obstructive sleep apnea)    uses cpap with humidification- auto   .  Personal history of radiation therapy   . PONV (postoperative nausea and vomiting)   . Renal cell carcinoma ([place])   . Tremor, essential 10/29/2017    Past Surgical History:  Procedure Laterality Date  . ABDOMINAL HYSTERECTOMY     adenomyosis  . ABDOMINAL HYSTERECTOMY    . BLADDER SURGERY    . BONE GRAFT HIP ILIAC CREST    . BREAST BIOPSY Bilateral   . BREAST LUMPECTOMY    . BREAST SURGERY    . CHOLECYSTECTOMY    . FRACTURE SURGERY Right    wrist  . KNEE ARTHROSCOPY    . KNEE ARTHROSCOPY W/ ACL RECONSTRUCTION     right  . RADIOACTIVE SEED GUIDED PARTIAL MASTECTOMY WITH AXILLARY SENTINEL LYMPH NODE BIOPSY  Right 10/13/2015   Procedure: RADIOACTIVE SEED GUIDED PARTIAL MASTECTOMY WITH AXILLARY SENTINEL LYMPH NODE BIOPSY;  Surgeon: Erroll Luna, MD;  Location: [facility];  Service: General;  Laterality: Right;  . right carpal and cubital tunnel release     . ROBOTIC ASSITED PARTIAL NEPHRECTOMY Left 06/19/2018   Procedure: XI ROBOTIC ASSITED PARTIAL NEPHRECTOMY;  Surgeon: Ardis Hughs, MD;  Location: [facility];  Service: Urology;  Laterality: Left;  . WRIST SURGERY      Family History  Problem Relation Age of Onset  . Hypertension Mother   . Autoimmune disease Mother        lichen planus  . Eczema Mother   . Parkinson's disease Mother   . Mental illness Mother        bipolar  . Heart disease Maternal Aunt   . Lung cancer Paternal Aunt   . Breast cancer Paternal Aunt        dx in his 1s  . Parkinsonism Paternal Aunt   . Lung cancer Paternal Grandfather   . Colon cancer Paternal Uncle        dx in his 84s  . Colon cancer Paternal Uncle        dx in his 77s  . Melanoma Father 38  . Psoriasis Father   . Hypertension Father   . Heart disease Father   . Breast cancer Sister        dx in her 49s  . Lupus Sister   . COPD Maternal Uncle   . Skin cancer Maternal Uncle   . Colon cancer Paternal Uncle        dx in his 72s  . Mental illness Son        bipolar  . Polycystic ovary syndrome Other     Social history:  reports that she has never smoked. She has never used smokeless tobacco. She reports that she does not drink alcohol or use drugs.    Allergies  Allergen Reactions  . Ramipril Nausea And Vomiting and Other (See Comments)    Ramipril--Caused Pancreatitis  . Shrimp [Shellfish Allergy] Anaphylaxis and Swelling  . Minocycline Hcl Hives  . Altace [Ramipril] Nausea And Vomiting and Nausea Only    Drug induced pancreatitis   . Lyrica [Pregabalin] Nausea And Vomiting  . Other     ADHESIVE GLUE USED FOR INCISIONS  . Adhesive [Tape] Rash    Please use paper tape Patient is  allergic to many adhesives   . Bactrim [Sulfamethoxazole-Trimethoprim] Other (See Comments)    May have caused kidney issues  . Drixoral [Brompheniramine-Pseudoeph] Rash  . Erythromycin Other (See Comments)    GI- Upset / severe abdominal pain  . Minocin [Minocycline Hcl] Nausea And Vomiting and Rash  .  Septra [Sulfamethoxazole-Trimethoprim] Rash  . Sinutab [Chlorphen-Pseudoephed-Apap] Rash    Medications:  Prior to Admission medications   Medication Sig Start Date End Date Taking? Authorizing Provider  calcium carbonate (TUMS - DOSED IN MG ELEMENTAL CALCIUM) 500 MG chewable tablet Chew 1 tablet (200 mg of elemental calcium total) by mouth 2 (two) times daily with breakfast and lunch. 05/29/18  Yes Johnson, Clanford L, MD  cyclobenzaprine (FLEXERIL) 10 MG tablet TAKE 1 TABLET BY MOUTH THREE TIMES DAILY FOR MUSCLE SPASM 11/20/18  Yes [provider]  DILT-XR 120 MG 24 hr capsule Take 120 mg by mouth daily.  08/12/18  Yes [provider]  FLUoxetine (PROZAC) 20 MG capsule Take 60 mg by mouth daily. 06/19/18  Yes [provider]  HYDROcodone-acetaminophen (NORCO) 7.5-325 MG tablet Take 1 tablet by mouth 3 (three) times daily as needed. for pain 11/21/18  Yes [provider]  losartan (COZAAR) 25 MG tablet Take 25 mg by mouth daily. 06/10/18  Yes [provider]  OVER THE COUNTER MEDICATION Take 1 capsule by mouth daily as needed. Diphenhydramate Over The Counter Anti-emetic   Yes [provider]  pramipexole (MIRAPEX) 0.125 MG tablet TAKE ONE TABLET BY MOUTH DAILY AT DINNERTIME AND 2 AT BEDTIME 03/09/19  Yes Kathrynn Ducking, MD  pravastatin (PRAVACHOL) 10 MG tablet Take 10 mg by mouth daily.   Yes [provider]  topiramate (TOPAMAX) 25 MG tablet Take 1 tablet (25 mg total) by mouth See admin instructions. Take one in the morning and 2 in the evening 09/11/18  Yes Kathrynn Ducking, MD  Vitamin D, Ergocalciferol, (DRISDOL) 50000 units  CAPS capsule Take 50,000 Units by mouth every 7 (seven) days. Take 1 tablet (50,000 units) by mouth every Thursday   Yes [provider]  furosemide (LASIX) 20 MG tablet Take 1 tablet (20 mg total) by mouth daily as needed for edema. 06/11/18 12/19/18  Arnoldo Lenis, MD    ROS:  Out of a complete 14 system review of symptoms, the patient complains only of the following symptoms, and all other reviewed systems are negative.  Muscle and joint pain Headache Tremor Insomnia, fatigue Restless legs  Blood pressure (!) 141/90, pulse 74, temperature 98.7 F (37.1 C), height 5' 8.25" (1.734 m), weight 242 lb (109.8 kg).  Physical Exam  General: The patient is alert and cooperative at the time of the examination.  The patient is markedly obese.  Skin: No significant peripheral edema is noted.   Neurologic Exam  Mental status: The patient is alert and oriented x 3 at the time of the examination. The patient has apparent normal recent and remote memory, with an apparently normal attention span and concentration ability.   Cranial nerves: Facial symmetry is present. Speech is normal, no aphasia or dysarthria is noted. Extraocular movements are full. Visual fields are full.  Motor: The patient has good strength in all 4 extremities.  Sensory examination: Soft touch sensation is symmetric on the face, arms, and legs.  Coordination: The patient has good finger-nose-finger and heel-to-shin bilaterally.  Gait and station: The patient has a normal gait. Tandem gait is slightly unsteady. Romberg is negative. No drift is seen.  Reflexes: Deep tendon reflexes are symmetric.   Assessment/Plan:  1.  Fibromyalgia  2.  Restless leg syndrome  3.  Intractable headache  4.  History of Lyme disease, status post antibiotic therapy  The patient will be increased on the Topamax: 200 mg at night for 2 weeks and  go to 50 mg in the morning and 100 mg in the evening.  A prescription for  the 50 mg Topamax tablets were sent in.  The patient will be set up again for the MRI of the lumbar spine.  She will follow-up here in 6 months.  I do not believe that the Lyme disease is the current cause of her muscle and joint pain, these symptoms clearly predates her Lyme disease infection by many years.  Jill Alexanders MD 06/02/2019 4:03 PM  [facility] [address] [place], [place] [address]  Phone [phone] Fax [phone]

## 2019-06-02 NOTE — Patient Instructions (Signed)
We will go up on the Topamax to 100 mg at night for 2 weeks, then take 50 mg in the morning and 100 mg in the evening.

## 2019-06-03 ENCOUNTER — Telehealth: Payer: Self-pay | Admitting: Neurology

## 2019-06-03 NOTE — Telephone Encounter (Signed)
Patient returned my call she is scheduled at [facility] for 06/10/19. No to the covid questions.

## 2019-06-03 NOTE — Telephone Encounter (Signed)
unable to leave vmail the mail box is full  [employer] Auth: [id number] 9exp. 06/03/19 to 11/29/19)

## 2019-06-10 ENCOUNTER — Ambulatory Visit: Payer: BC Managed Care – PPO

## 2019-06-10 ENCOUNTER — Other Ambulatory Visit: Payer: Self-pay

## 2019-06-10 DIAGNOSIS — G8929 Other chronic pain: Secondary | ICD-10-CM | POA: Diagnosis not present

## 2019-06-10 DIAGNOSIS — M5441 Lumbago with sciatica, right side: Secondary | ICD-10-CM

## 2019-06-10 DIAGNOSIS — M5442 Lumbago with sciatica, left side: Secondary | ICD-10-CM

## 2019-06-14 ENCOUNTER — Telehealth: Payer: Self-pay | Admitting: Neurology

## 2019-06-14 NOTE — Telephone Encounter (Signed)
I called the patient, unable leave a message.  The MRI of the low back is relatively unremarkable, no surgically minimal issues.  Low back pain may be related to underlying history of fibromyalgia.  She is to let us know if she requires further medication adjustments for pain management.   MRI lumbar 06/12/19:  IMPRESSION:   MRI lumbar spine (without) demonstrating: - Mild disc bulging at L1-2, L3-4, L4-5, L5-S1. - No spinal stenosis or foraminal narrowing.

## 2019-06-15 DIAGNOSIS — H5111 Convergence insufficiency: Secondary | ICD-10-CM | POA: Diagnosis not present

## 2019-06-15 DIAGNOSIS — H5010 Unspecified exotropia: Secondary | ICD-10-CM | POA: Diagnosis not present

## 2019-06-15 NOTE — Telephone Encounter (Signed)
I was able to reach the pt and advise on MRI results. Pt verbalized understanding and had no further questions at this time. Pt will call back if further meds for pain medication is needed.

## 2019-06-19 ENCOUNTER — Other Ambulatory Visit: Payer: Self-pay | Admitting: Neurology

## 2019-06-23 ENCOUNTER — Inpatient Hospital Stay (HOSPITAL_COMMUNITY): Payer: BC Managed Care – PPO | Attending: Hematology | Admitting: Hematology

## 2019-06-23 ENCOUNTER — Other Ambulatory Visit: Payer: Self-pay

## 2019-06-23 ENCOUNTER — Encounter (HOSPITAL_COMMUNITY): Payer: Self-pay | Admitting: Hematology

## 2019-06-23 VITALS — BP 119/81 | HR 75 | Temp 97.1°F | Resp 20 | Wt 240.0 lb

## 2019-06-23 DIAGNOSIS — Z171 Estrogen receptor negative status [ER-]: Secondary | ICD-10-CM | POA: Diagnosis not present

## 2019-06-23 DIAGNOSIS — F411 Generalized anxiety disorder: Secondary | ICD-10-CM | POA: Diagnosis not present

## 2019-06-23 DIAGNOSIS — E559 Vitamin D deficiency, unspecified: Secondary | ICD-10-CM | POA: Diagnosis not present

## 2019-06-23 DIAGNOSIS — A6920 Lyme disease, unspecified: Secondary | ICD-10-CM | POA: Diagnosis not present

## 2019-06-23 DIAGNOSIS — D472 Monoclonal gammopathy: Secondary | ICD-10-CM

## 2019-06-23 DIAGNOSIS — I272 Pulmonary hypertension, unspecified: Secondary | ICD-10-CM | POA: Diagnosis not present

## 2019-06-23 DIAGNOSIS — Z853 Personal history of malignant neoplasm of breast: Secondary | ICD-10-CM | POA: Insufficient documentation

## 2019-06-23 DIAGNOSIS — C642 Malignant neoplasm of left kidney, except renal pelvis: Secondary | ICD-10-CM | POA: Insufficient documentation

## 2019-06-23 DIAGNOSIS — C50311 Malignant neoplasm of lower-inner quadrant of right female breast: Secondary | ICD-10-CM | POA: Diagnosis not present

## 2019-06-23 NOTE — Assessment & Plan Note (Signed)
1.  Right breast high-grade DCIS: -Status post right lumpectomy and sentinel lymph node biopsy on 10/03/2015 by Dr. Brantley Stage.  0.4 cm high-grade DCIS, 0/1 lymph node positive, ER/PR negative. -She underwent radiation therapy in the adjuvant setting. -On 05/01/2017 she underwent left breast biopsy upper outer quadrant-fibroadenoma.  Left breast upper inner quadrant biopsy showed fibrocystic change. - Today's physical examination does not show any palpable masses.  Stable lumpectomy scar in the inferior margin of the areola. - Mammogram on 03/31/2019 was BI-RADS Category 2.  Genetic testing was negative. - Will monitor with yearly mammograms.  2.  Elevated free light chain ratio: - She had elevated free kappa light chains with elevated ratio. -Most recent blood work on 05/25/2019 shows SPEP negative.  Free light chain ratio was elevated at 2.19 and kappa light chains at 36.6. -We will plan to repeat these labs in 6 months.  Hemoglobin creatinine and calcium were normal.  3.  Left kidney clear-cell renal cell carcinoma: -Status post partial nephrectomy on 06/19/2018, RCC, clear cell type, grade 2, 1.5 cm, pT1a, negative margins. -Bone scan on 08/07/2018 was negative. - She follows up with her urologist.

## 2019-06-23 NOTE — Progress Notes (Signed)
[place],  [address]   CLINIC:  Medical Oncology/Hematology  PCP:  Celene Squibb, MD [address] [place] [address] [phone]   REASON FOR VISIT: Follow-up for breast cancer of lower-inner quadrant of right female breast and history of left renal cell cancer and MGUS  CURRENT THERAPY: Observation   BRIEF ONCOLOGIC HISTORY:  Oncology History Overview Note  Stage 0: Right breast DCIS, high-grade, ER/PR negative Clear Cell RCC   Breast cancer of lower-inner quadrant of right female breast ([place])  08/19/2015 Breast US   Masslike asymmetry within the lower inner quadrant of the right breast, at posterior depth, with associated microcalcifications which are new.    08/23/2015 Initial Biopsy   AT LEAST HIGH DUCTAL CARCINOMA IN SITU   08/23/2015 Receptors her2   Estrogen Receptor: 0%, NEGATIVE Progesterone Receptor: 0%, NEGATIVE   08/26/2015 Initial Diagnosis   Breast cancer of lower-inner quadrant of right female breast ([place])   08/31/2015 Procedure   [facility] negative.  Genes tested include: ATM, BRCA1, BRCA2, CDH1, CHEK2, PALB2, PTEN, & TP53.    10/13/2015 Pathologic Stage   pTis, pN0: Stage 0    10/13/2015 Surgery   Right lumpectomy & SLNB ([name]). High grade DCIS, 0.4 cm. Negative margins.  1 right axillary SLN neg. ER/PR repeated and both remain negative.    11/17/2015 - 12/21/2015 Radiation Therapy   Treated at [facility] Right breast: Total dose 50 Gy in 25 fractions. 3-D tangents; 6 MV photons.    08/22/2018 Genetic Testing   Negative genetic testing on the CancerNExt Expanded+RNA insight panel.  The CancerNext-Expanded gene panel offered by [facility] and includes sequencing and rearrangement analysis for the following 67 genes: AIP, ALK, APC*, ATM*, BAP1, BARD1, BLM, BMPR1A, BRCA1*, BRCA2*, BRIP1*, CDH1*, CDK4, CDKN1B, CDKN2A, CHEK2*, DICER1, FANCC, FH, FLCN, GALNT12, HOXB13, MAX, MEN1, MET,  MLH1*, MRE11A, MSH2*, MSH6*, MUTYH*, NBN, NF1*, NF2, PALB2*, PHOX2B, PMS2*, POLD1, POLE, POT1, PRKAR1A, PTCH1, PTEN*, RAD50, RAD51C*, RAD51D*, RB1, RET, SDHA, SDHAF2, SDHB, SDHC, SDHD, SMAD4, SMARCA4, SMARCB1, SMARCE1, STK11, SUFU, TMEM127, TP53*, TSC1, TSC2, VHL and XRCC2 (sequencing and deletion/duplication); MITF (sequencing only); EPCAM and GREM1 (deletion/duplication only). DNA and RNA analyses performed for * genes. The report date is 08/22/2018.   Renal cancer, left ([place])  07/31/2018 Initial Diagnosis   Renal cancer, left ([place])   08/22/2018 Genetic Testing   Negative genetic testing on the CancerNExt Expanded+RNA insight panel.  The CancerNext-Expanded gene panel offered by [facility] and includes sequencing and rearrangement analysis for the following 67 genes: AIP, ALK, APC*, ATM*, BAP1, BARD1, BLM, BMPR1A, BRCA1*, BRCA2*, BRIP1*, CDH1*, CDK4, CDKN1B, CDKN2A, CHEK2*, DICER1, FANCC, FH, FLCN, GALNT12, HOXB13, MAX, MEN1, MET, MLH1*, MRE11A, MSH2*, MSH6*, MUTYH*, NBN, NF1*, NF2, PALB2*, PHOX2B, PMS2*, POLD1, POLE, POT1, PRKAR1A, PTCH1, PTEN*, RAD50, RAD51C*, RAD51D*, RB1, RET, SDHA, SDHAF2, SDHB, SDHC, SDHD, SMAD4, SMARCA4, SMARCB1, SMARCE1, STK11, SUFU, TMEM127, TP53*, TSC1, TSC2, VHL and XRCC2 (sequencing and deletion/duplication); MITF (sequencing only); EPCAM and GREM1 (deletion/duplication only). DNA and RNA analyses performed for * genes. The report date is 08/22/2018.      CANCER STAGING: Cancer Staging Breast cancer of lower-inner quadrant of right female breast [facility]) Staging form: Breast, AJCC 7th Edition - Clinical stage from 08/31/2015: Stage 0 (Tis (DCIS), N0, M0) - Unsigned - Pathologic stage from 10/13/2015: Stage 0 (Tis (DCIS), N0, cM0) - Signed by Holley Bouche, NP on 02/09/2016    INTERVAL HISTORY:  [name] 53 y.o.  female seen for follow-up of left breast cancer as well as elevated free light chain ratio.  Appetite and energy levels are at 50%.  Generalized pains  in the muscles and joints have been stable and chronic.  Denies new onset bone pains.  Denies any bleeding per rectum or melena.  Denies any nausea, vomiting, diarrhea or constipation.  No change in bowel habits reported.  No hematuria noted.  Occasional dizziness is stable.   REVIEW OF SYSTEMS:  Review of Systems  Neurological: Positive for dizziness.  Psychiatric/Behavioral: Positive for depression and sleep disturbance. The patient is nervous/anxious.   All other systems reviewed and are negative.    PAST MEDICAL/SURGICAL HISTORY:  Past Medical History:  Diagnosis Date   Allergy    seasonal   Anxiety    Arthritis    Balance problem    Brain aneurysm    2 mm ACA region aneurysm by 09/21/15 MRA   Breast cancer [facility])    Breast cancer of lower-inner quadrant of right female breast ([place]) 08/26/2015   Chronic kidney disease    acute renal failure one occasion   Chronic low back pain 02/15/8340   Complication of anesthesia    heart rate drops and O2 sats drop   Constipation    Depression    Diplopia 10/29/2017   Dizziness 10/29/2017   Elevated liver function tests    Family history of adverse reaction to anesthesia    mom has n/v   Family history of breast cancer    Family history of colon cancer    Fibromyalgia    Fibromyalgia    Gait abnormality 10/29/2017   Genetic testing 09/08/2015   Negative genetic testing on the Breast/High Moderate Risk panel. The Breast High/Moderate Risk gene panel offered by [facility] includes sequencing and deletion/duplication analysis of the following 9 genes: ATM, BRCA1, BRCA2, CDH1, CHEK2, PALB2, PTEN, STK11, and TP53. The report date is September 08, 2015.  The Rest of the Comprehensive Cancer panel has been reflexed and will be available in 2-3 weeks.  POLD1 c.208G>T VUS found on the Remainder of the Comprehensive cancer panel.  The Comprehensive Cancer Panel offered by [facility] includes sequencing and/or deletion duplication testing of  the following 32 genes: APC, ATM, AXIN2, BARD1, BMPR1A, BRCA1, BRCA2, BRIP1, CDH1, CDK4, CDKN2A, CHEK2, EPCAM, FANCC, MLH1, MSH2, MSH6, MUTYH, NBN, PALB2, PMS2, POLD1, POLE, PTEN, RAD51C, RAD51D, SCG5/GREM1, SMAD4, STK11, TP53, VHL, and XRCC2.   The report date is 09/12/2015.    GERD (gastroesophageal reflux disease)    none recent   H/O acute renal failure 09/22/15   admitted to [facility]   Headache    occipital and temporal   History of hiatal hernia    Hyperlipemia    Hyperlipidemia    Hypertension    Irritable bowel syndrome (IBS)    MGUS (monoclonal gammopathy of unknown significance) 03/15/2016   Neuromuscular disorder (HCC)    fibromyalgia   Occasional tremors    OSA (obstructive sleep apnea)    uses cpap with humidification- auto    Personal history of radiation therapy    PONV (postoperative nausea and vomiting)    Renal cell carcinoma (HCC)    Tremor, essential 10/29/2017   Past Surgical History:  Procedure Laterality Date   ABDOMINAL HYSTERECTOMY     adenomyosis   ABDOMINAL HYSTERECTOMY     BLADDER SURGERY     BONE GRAFT HIP ILIAC CREST     BREAST BIOPSY Bilateral    BREAST LUMPECTOMY  BREAST SURGERY     CHOLECYSTECTOMY     FRACTURE SURGERY Right    wrist   KNEE ARTHROSCOPY     KNEE ARTHROSCOPY W/ ACL RECONSTRUCTION     right   RADIOACTIVE SEED GUIDED PARTIAL MASTECTOMY WITH AXILLARY SENTINEL LYMPH NODE BIOPSY Right 10/13/2015   Procedure: RADIOACTIVE SEED GUIDED PARTIAL MASTECTOMY WITH AXILLARY SENTINEL LYMPH NODE BIOPSY;  Surgeon: Erroll Luna, MD;  Location: [facility];  Service: General;  Laterality: Right;   right carpal and cubital tunnel release      ROBOTIC ASSITED PARTIAL NEPHRECTOMY Left 06/19/2018   Procedure: XI ROBOTIC ASSITED PARTIAL NEPHRECTOMY;  Surgeon: Ardis Hughs, MD;  Location: [facility];  Service: Urology;  Laterality: Left;   WRIST SURGERY       SOCIAL HISTORY:  Social History   Socioeconomic  History   Marital status: Married    Spouse name: [name]   Number of children: 2   Years of education: 12   Highest education level: Not on file  Occupational History   Occupation: physician office  Social Needs   Financial resource strain: Not on file   Food insecurity    Worry: Not on file    Inability: Not on file   Transportation needs    Medical: Not on file    Non-medical: Not on file  Tobacco Use   Smoking status: Never Smoker   Smokeless tobacco: Never Used  Substance and Sexual Activity   Alcohol use: Never    Frequency: Never    Comment: maybe one a year   Drug use: Never   Sexual activity: Yes    Birth control/protection: Surgical  Lifestyle   Physical activity    Days per week: Not on file    Minutes per session: Not on file   Stress: Not on file  Relationships   Social connections    Talks on phone: Not on file    Gets together: Not on file    Attends religious service: Not on file    Active member of club or organization: Not on file    Attends meetings of clubs or organizations: Not on file    Relationship status: Not on file   Intimate partner violence    Fear of current or ex partner: Not on file    Emotionally abused: Not on file    Physically abused: Not on file    Forced sexual activity: Not on file  Other Topics Concern   Not on file  Social History Narrative   ** Merged History Encounter **       Lives with husband Husband on disability Children grown and moved worries about family illness- mother and sister Caffeine use:      FAMILY HISTORY:  Family History  Problem Relation Age of Onset   Hypertension Mother    Autoimmune disease Mother        lichen planus   Eczema Mother    Parkinson's disease Mother    Mental illness Mother        bipolar   Heart disease Maternal Aunt    Lung cancer Paternal Aunt    Breast cancer Paternal Aunt        dx in his 35s   Parkinsonism Paternal Aunt    Lung cancer  Paternal Grandfather    Colon cancer Paternal Uncle        dx in his 76s   Colon cancer Paternal Uncle        dx in  his 45s   Melanoma Father 84   Psoriasis Father    Hypertension Father    Heart disease Father    Breast cancer Sister        dx in her 72s   Lupus Sister    COPD Maternal Uncle    Skin cancer Maternal Uncle    Colon cancer Paternal Uncle        dx in his 71s   Mental illness Son        bipolar   Polycystic ovary syndrome Other     CURRENT MEDICATIONS:  Outpatient Encounter Medications as of 06/23/2019  Medication Sig   DILT-XR 120 MG 24 hr capsule Take 120 mg by mouth daily.    FLUoxetine (PROZAC) 20 MG capsule Take 60 mg by mouth daily.   HYDROcodone-acetaminophen (NORCO) 7.5-325 MG tablet Take 1 tablet by mouth 3 (three) times daily as needed. for pain   losartan (COZAAR) 25 MG tablet Take 25 mg by mouth daily.   pramipexole (MIRAPEX) 0.125 MG tablet TAKE 1 TABLET BY MOUTH DAILY AT DINNERTIME AND TAKE TWO TABLETS BY MOUTH AT BEDTIME   pravastatin (PRAVACHOL) 10 MG tablet Take 10 mg by mouth daily.   topiramate (TOPAMAX) 50 MG tablet 2 tablets at night for 2 weeks, then take one in the morning and 2 at night   Vitamin D, Ergocalciferol, (DRISDOL) 50000 units CAPS capsule Take 50,000 Units by mouth every 7 (seven) days. Take 1 tablet (50,000 units) by mouth every Thursday   calcium carbonate (TUMS - DOSED IN MG ELEMENTAL CALCIUM) 500 MG chewable tablet Chew 1 tablet (200 mg of elemental calcium total) by mouth 2 (two) times daily with breakfast and lunch. (Patient not taking: Reported on 06/23/2019)   cyclobenzaprine (FLEXERIL) 10 MG tablet TAKE 1 TABLET BY MOUTH THREE TIMES DAILY FOR MUSCLE SPASM   furosemide (LASIX) 20 MG tablet Take 20 mg by mouth daily. PRN   OVER THE COUNTER MEDICATION Take 1 capsule by mouth daily as needed. Diphenhydramate Over The Counter Anti-emetic   [DISCONTINUED] furosemide (LASIX) 20 MG tablet Take 1 tablet (20 mg  total) by mouth daily as needed for edema. (Patient not taking: Reported on 06/23/2019)   [DISCONTINUED] pramipexole (MIRAPEX) 0.125 MG tablet TAKE ONE TABLET BY MOUTH DAILY AT DINNERTIME AND 2 AT BEDTIME   No facility-administered encounter medications on file as of 06/23/2019.     ALLERGIES:  Allergies  Allergen Reactions   Ramipril Nausea And Vomiting and Other (See Comments)    Ramipril--Caused Pancreatitis   Shrimp [Shellfish Allergy] Anaphylaxis and Swelling   Minocycline Hcl Hives   Altace [Ramipril] Nausea And Vomiting and Nausea Only    Drug induced pancreatitis    Lyrica [Pregabalin] Nausea And Vomiting   Other     ADHESIVE GLUE USED FOR INCISIONS   Adhesive [Tape] Rash    Please use paper tape Patient is allergic to many adhesives    Bactrim [Sulfamethoxazole-Trimethoprim] Other (See Comments)    May have caused kidney issues   Drixoral [Brompheniramine-Pseudoeph] Rash   Erythromycin Other (See Comments)    GI- Upset / severe abdominal pain   Minocin [Minocycline Hcl] Nausea And Vomiting and Rash   Septra [Sulfamethoxazole-Trimethoprim] Rash   Sinutab [Chlorphen-Pseudoephed-Apap] Rash     PHYSICAL EXAM:  ECOG Performance status: 1  Vitals:   06/23/19 0819  BP: 119/81  Pulse: 75  Resp: 20  Temp: (!) 97.1 F (36.2 C)  SpO2: 97%   Filed Weights   06/23/19 0819  Weight: 240 lb (108.9 kg)    Physical Exam Constitutional:      Appearance: Normal appearance. She is normal weight.  Cardiovascular:     Rate and Rhythm: Normal rate and regular rhythm.     Heart sounds: Normal heart sounds.  Pulmonary:     Effort: Pulmonary effort is normal.     Breath sounds: Normal breath sounds.  Abdominal:     General: Abdomen is flat.     Palpations: Abdomen is soft.  Musculoskeletal: Normal range of motion.  Skin:    General: Skin is warm and dry.  Neurological:     Mental Status: She is alert and oriented to person, place, and time. Mental status is at  baseline.  Psychiatric:        Mood and Affect: Mood normal.        Behavior: Behavior normal.        Thought Content: Thought content normal.        Judgment: Judgment normal.   Breast: No palpable masses, no skin changes or nipple discharge, no adenopathy.   LABORATORY DATA:  I have reviewed the labs as listed.  CBC    Component Value Date/Time   WBC 5.9 05/25/2019 1116   RBC 4.20 05/25/2019 1116   HGB 12.7 05/25/2019 1116   HGB 15.5 08/31/2015 1217   HCT 39.3 05/25/2019 1116   HCT 45.4 08/31/2015 1217   PLT 198 05/25/2019 1116   PLT 253 08/31/2015 1217   MCV 93.6 05/25/2019 1116   MCV 95.0 08/31/2015 1217   MCH 30.2 05/25/2019 1116   MCHC 32.3 05/25/2019 1116   RDW 13.1 05/25/2019 1116   RDW 13.5 08/31/2015 1217   LYMPHSABS 1.7 05/25/2019 1116   LYMPHSABS 2.3 08/31/2015 1217   MONOABS 0.5 05/25/2019 1116   MONOABS 0.5 08/31/2015 1217   EOSABS 0.2 05/25/2019 1116   EOSABS 0.1 08/31/2015 1217   BASOSABS 0.0 05/25/2019 1116   BASOSABS 0.0 08/31/2015 1217   CMP Latest Ref Rng & Units 05/25/2019 02/13/2019 11/24/2018  Glucose 70 - 99 mg/dL 121(H) - 121(H)  BUN 6 - 20 mg/dL 21(H) - 21(H)  Creatinine 0.44 - 1.00 mg/dL 0.85 0.80 0.86  Sodium 135 - 145 mmol/L 140 - 137  Potassium 3.5 - 5.1 mmol/L 3.7 - 3.4(L)  Chloride 98 - 111 mmol/L 108 - 108  CO2 22 - 32 mmol/L 23 - 20(L)  Calcium 8.9 - 10.3 mg/dL 9.0 - 8.8(L)  Total Protein 6.5 - 8.1 g/dL 6.8 - 7.2  Total Bilirubin 0.3 - 1.2 mg/dL 0.4 - 0.4  Alkaline Phos 38 - 126 U/L 60 - 55  AST 15 - 41 U/L 23 - 19  ALT 0 - 44 U/L 32 - 21       DIAGNOSTIC IMAGING:  I have independently reviewed the scans and discussed with the patient.     ASSESSMENT & PLAN:   Breast cancer of lower-inner quadrant of right female breast ([place]) 1.  Right breast high-grade DCIS: -Status post right lumpectomy and sentinel lymph node biopsy on 10/03/2015 by Dr. Brantley Stage.  0.4 cm high-grade DCIS, 0/1 lymph node positive, ER/PR negative. -She  underwent radiation therapy in the adjuvant setting. -On 05/01/2017 she underwent left breast biopsy upper outer quadrant-fibroadenoma.  Left breast upper inner quadrant biopsy showed fibrocystic change. - Today's physical examination does not show any palpable masses.  Stable lumpectomy scar in the inferior margin of the areola. - Mammogram on 03/31/2019 was BI-RADS Category 2.  Genetic testing was  negative. - Will monitor with yearly mammograms.  2.  Elevated free light chain ratio: - She had elevated free kappa light chains with elevated ratio. -Most recent blood work on 05/25/2019 shows SPEP negative.  Free light chain ratio was elevated at 2.19 and kappa light chains at 36.6. -We will plan to repeat these labs in 6 months.  Hemoglobin creatinine and calcium were normal.  3.  Left kidney clear-cell renal cell carcinoma: -Status post partial nephrectomy on 06/19/2018, RCC, clear cell type, grade 2, 1.5 cm, pT1a, negative margins. -Bone scan on 08/07/2018 was negative. - She follows up with her urologist.   Total time spent is 25 minutes with more than 50% of the time spent face-to-face discussing lab results, counseling and coordination of care.  Orders placed this encounter:  Orders Placed This Encounter  Procedures   CBC with Differential/Platelet   Comprehensive metabolic panel   Protein electrophoresis, serum   Kappa/lambda light chains   Lactate dehydrogenase   Immunofixation electrophoresis      Derek Jack, MD [place] [phone]

## 2019-06-23 NOTE — Patient Instructions (Addendum)
[facility] at [facility] Discharge Instructions  You were seen today by Dr. Katragadda. He went over your recent lab results. He will see you back in 6 months for labs and follow up.   Thank you for choosing [facility] at [facility] to provide your oncology and hematology care.  To afford each patient quality time with our provider, please arrive at least 15 minutes before your scheduled appointment time.   If you have a lab appointment with the Cancer Center please come in thru the  Main Entrance and check in at the main information desk  You need to re-schedule your appointment should you arrive 10 or more minutes late.  We strive to give you quality time with our providers, and arriving late affects you and other patients whose appointments are after yours.  Also, if you no show three or more times for appointments you may be dismissed from the clinic at the providers discretion.     Again, thank you for choosing [facility].  Our hope is that these requests will decrease the amount of time that you wait before being seen by our physicians.       _____________________________________________________________  Should you have questions after your visit to [facility], please contact our office at [phone] between the hours of 8:00 a.m. and 4:30 p.m.  Voicemails left after 4:00 p.m. will not be returned until the following business day.  For prescription refill requests, have your pharmacy contact our office and allow 72 hours.    Cancer Center Support Programs:   > Cancer Support Group  2nd Tuesday of the month 1pm-2pm, Journey Room    

## 2019-07-01 DIAGNOSIS — L859 Epidermal thickening, unspecified: Secondary | ICD-10-CM | POA: Diagnosis not present

## 2019-07-01 DIAGNOSIS — L817 Pigmented purpuric dermatosis: Secondary | ICD-10-CM | POA: Diagnosis not present

## 2019-07-01 DIAGNOSIS — D485 Neoplasm of uncertain behavior of skin: Secondary | ICD-10-CM | POA: Diagnosis not present

## 2019-08-13 DIAGNOSIS — G4733 Obstructive sleep apnea (adult) (pediatric): Secondary | ICD-10-CM | POA: Diagnosis not present

## 2019-08-18 ENCOUNTER — Other Ambulatory Visit: Payer: Self-pay | Admitting: Neurology

## 2019-08-18 DIAGNOSIS — G5601 Carpal tunnel syndrome, right upper limb: Secondary | ICD-10-CM | POA: Diagnosis not present

## 2019-08-18 DIAGNOSIS — G5602 Carpal tunnel syndrome, left upper limb: Secondary | ICD-10-CM | POA: Diagnosis not present

## 2019-08-18 DIAGNOSIS — G5621 Lesion of ulnar nerve, right upper limb: Secondary | ICD-10-CM | POA: Diagnosis not present

## 2019-08-18 DIAGNOSIS — M1812 Unilateral primary osteoarthritis of first carpometacarpal joint, left hand: Secondary | ICD-10-CM | POA: Diagnosis not present

## 2019-08-18 DIAGNOSIS — G5622 Lesion of ulnar nerve, left upper limb: Secondary | ICD-10-CM | POA: Diagnosis not present

## 2019-08-19 ENCOUNTER — Other Ambulatory Visit: Payer: Self-pay | Admitting: Podiatry

## 2019-08-19 ENCOUNTER — Other Ambulatory Visit: Payer: Self-pay

## 2019-08-19 ENCOUNTER — Ambulatory Visit (INDEPENDENT_AMBULATORY_CARE_PROVIDER_SITE_OTHER): Payer: BC Managed Care – PPO

## 2019-08-19 ENCOUNTER — Ambulatory Visit: Payer: BC Managed Care – PPO | Admitting: Podiatry

## 2019-08-19 ENCOUNTER — Encounter: Payer: Self-pay | Admitting: Podiatry

## 2019-08-19 VITALS — BP 154/88 | HR 87 | Resp 16

## 2019-08-19 DIAGNOSIS — M79672 Pain in left foot: Secondary | ICD-10-CM | POA: Diagnosis not present

## 2019-08-19 DIAGNOSIS — M779 Enthesopathy, unspecified: Secondary | ICD-10-CM

## 2019-08-19 DIAGNOSIS — M79671 Pain in right foot: Secondary | ICD-10-CM | POA: Diagnosis not present

## 2019-08-19 DIAGNOSIS — M25571 Pain in right ankle and joints of right foot: Secondary | ICD-10-CM

## 2019-08-19 NOTE — Progress Notes (Signed)
   Subjective:    Patient ID: Sarah Cooley, female    DOB: 1966-05-22, 53 y.o.   MRN: [id number]  HPI    Review of Systems  All other systems reviewed and are negative.      Objective:   Physical Exam        Assessment & Plan:

## 2019-08-20 NOTE — Progress Notes (Signed)
Subjective:   Patient ID: Sarah Cooley, female   DOB: 53 y.o.   MRN: [id number]   HPI Patient presents complaining of generalized pain in both feet and states the right foot has become very tender in the ankle and the forefoot.  States this is been ongoing and that she does have a number of other health problems that could be contributory to the pain she experiences.  Patient does not smoke and would like to be more active   Review of Systems  All other systems reviewed and are negative.       Objective:  Physical Exam Vitals signs and nursing note reviewed.  Constitutional:      Appearance: She is well-developed.  Pulmonary:     Effort: Pulmonary effort is normal.  Musculoskeletal: Normal range of motion.  Skin:    General: Skin is warm.  Neurological:     Mental Status: She is alert.     Neurovascular status intact muscle strength found to be adequate range of motion within normal limits.  Patient is noted to have quite a bit of discomfort in the sinus tarsi right and also is noted to have inflammation pain of the second MPJ right and generalized pain in both feet.  Patient has good digital perfusion well oriented x3     Assessment:  Acute sinus tarsitis right along with inflammatory capsulitis second MPJ right and generalized foot pain bilateral     Plan:  H&P conditions reviewed x-rays reviewed.  Today I did a sterile prep and injected the sinus tarsi right 3 mg Kenalog 5 mg Xylocaine and then aspirated the second MPJ getting out a small amount of clear fluid injected quarter cc dexamethasone Kenalog and applied padding.  Discussed long-term orthotics and educating her on these I will see her back again in several weeks to see response to the first treatments  X-rays indicate that there is some probable arthritis around the second MPJ right but everything else looks healthy at the current time

## 2019-08-26 ENCOUNTER — Other Ambulatory Visit: Payer: Self-pay | Admitting: Urology

## 2019-08-26 DIAGNOSIS — D3000 Benign neoplasm of unspecified kidney: Secondary | ICD-10-CM

## 2019-08-27 DIAGNOSIS — H903 Sensorineural hearing loss, bilateral: Secondary | ICD-10-CM | POA: Diagnosis not present

## 2019-08-31 DIAGNOSIS — L814 Other melanin hyperpigmentation: Secondary | ICD-10-CM | POA: Diagnosis not present

## 2019-08-31 DIAGNOSIS — L821 Other seborrheic keratosis: Secondary | ICD-10-CM | POA: Diagnosis not present

## 2019-08-31 DIAGNOSIS — D1801 Hemangioma of skin and subcutaneous tissue: Secondary | ICD-10-CM | POA: Diagnosis not present

## 2019-08-31 DIAGNOSIS — B078 Other viral warts: Secondary | ICD-10-CM | POA: Diagnosis not present

## 2019-09-02 DIAGNOSIS — M25511 Pain in right shoulder: Secondary | ICD-10-CM | POA: Diagnosis not present

## 2019-09-09 ENCOUNTER — Other Ambulatory Visit: Payer: Self-pay

## 2019-09-09 ENCOUNTER — Ambulatory Visit: Payer: BC Managed Care – PPO | Admitting: Podiatry

## 2019-09-09 ENCOUNTER — Encounter: Payer: Self-pay | Admitting: Podiatry

## 2019-09-09 DIAGNOSIS — A6920 Lyme disease, unspecified: Secondary | ICD-10-CM | POA: Diagnosis not present

## 2019-09-09 DIAGNOSIS — M779 Enthesopathy, unspecified: Secondary | ICD-10-CM | POA: Diagnosis not present

## 2019-09-09 DIAGNOSIS — M25571 Pain in right ankle and joints of right foot: Secondary | ICD-10-CM | POA: Diagnosis not present

## 2019-09-09 DIAGNOSIS — M545 Low back pain: Secondary | ICD-10-CM | POA: Diagnosis not present

## 2019-09-09 DIAGNOSIS — N3001 Acute cystitis with hematuria: Secondary | ICD-10-CM | POA: Diagnosis not present

## 2019-09-09 DIAGNOSIS — Z6834 Body mass index (BMI) 34.0-34.9, adult: Secondary | ICD-10-CM | POA: Diagnosis not present

## 2019-09-09 DIAGNOSIS — G4733 Obstructive sleep apnea (adult) (pediatric): Secondary | ICD-10-CM | POA: Diagnosis not present

## 2019-09-10 NOTE — Progress Notes (Signed)
Subjective:   Patient ID: Sarah Cooley, female   DOB: 53 y.o.   MRN: [id number]   HPI Patient presents stating she still getting pain in her joint of her right foot and states it is improved but it is still a problem and the ankle feels better   ROS      Objective:  Physical Exam  Neurovascular status intact with significant improvement sinus tarsitis right with a arthritic second MPJ right with pain still noted with mild to moderate improvement but a lot of pressure against the joint surface     Assessment:  Inflammatory capsulitis improving right second MPJ but arthritis is a long-term problem with improved sinus tarsitis right     Plan:  H&P reviewed condition and I recommended orthotics to offload weight off this joint surface and she is casted for functional orthotics today.  I discussed at 1 point we may need to resect arthritic bone with possible implant but will get a hold off on that and try to use conservative treatment before considering surgery

## 2019-09-15 NOTE — Progress Notes (Signed)
Referring Provider: Celene Squibb, MD Primary Care Physician:  Celene Squibb, MD Primary GI Physician: Dr. Oneida Alar  Chief Complaint  Patient presents with  . hfu    from 2019 for CDIFF. No diarrhea in few months  . Nausea    every morning    HPI:   Sarah Cooley is a 53 y.o. female presenting today with a GI history of IBS with alternating constipation and diarrhea, hiatal hernia, and GERD.  Other PMHx as per below.  She is presenting today for hospital follow-up from 2019.   We last saw patient during hospitalization in August 2019 for diarrhea and change in bowel habits.  Admitted to intermittent constipation and diarrhea for many years currently having flares lasting 2-3 weeks.  Reported recent EGD with biopsies, no celiac disease or H. pylori.  She was having 8 watery BMs daily.  Imodium would cause no BM for 3 to 4 days.  Also with intermittent nausea with rare vomiting.  Had lost 30 pounds in the last few months.  Giardia stool testing negative.  She is found to have positive C. difficile antigen but negative toxin.  C. difficile PCR positive.  As she presented with significant diarrhea and had prior exposure antibiotics, she was treated for CDI with oral vancomycin.  Patient had clinical improvement of her diarrhea while inpatient and was advised to follow-up in office in 3-4 months.   Today she states she had resolution of diarrhea after vancomycin. Reports she has had a couple episodes of diarrhea since but nothing like it was when she had C. diff.  Had kidney cancer with left partial nephrectomy in 2019. This past October she tested positive for Lyme disease. She was treated twice with dosycycline. 3 months ago, she feels all of her health problems started worsening again.   3 months ago, she had 1 day of watery diarrhea. Associated abdominal pain and nausea. Took 1-2 weeks for abdominal pain to subside. Felt like someone had "punched" her in her lower abdomen. No current abdominal  pain. Last week had hematuria and suprapubic pain with urination. PCP treated with cipro x 7 days for UTI. Continues with daily nausea, worse in the morning. Nausea had resolved prior to 3 months ago. Has lost about 8 lbs in the last 3 months. Has decreased appetite. Also with worsening indigestion. Was taking tums a lot. Feels this has calmed down somewhat. No vomiting. No dysphagia. Reports acid reflux with "hot acid" coming up into her throat a couple times a week. This is chronic. Also with epigastric burning intermittently. No NSAIDs. Tries to limit soda and fried foods.   BMs usually daily. Now she has not had a BM in 3 days. Consistency varies. Most of the time, it is soft and formed. Occasionally harder or looser. No brbpr or melena.   Last colonoscopy within the last 4 years with [facility]. No polyps. Thinks she was supposed to repeat in 10 years.  EGD within the last 3 years with [facility]. Had biopsies. Reports she has had H. Pylori in the past and was treated with PrevPac. This was in her 81s.   Past Medical History:  Diagnosis Date  . Allergy    seasonal  . Anxiety   . Arthritis   . Balance problem   . Brain aneurysm    2 mm ACA region aneurysm by 09/21/15 MRA  . Breast cancer ([place])   . Breast cancer of lower-inner quadrant of right female  breast ([place]) 08/26/2015  . Chronic kidney disease    acute renal failure one occasion  . Chronic low back pain 12/02/2018  . Complication of anesthesia    heart rate drops and O2 sats drop  . Constipation   . Depression   . Diplopia 10/29/2017  . Dizziness 10/29/2017  . Elevated liver function tests   . Family history of adverse reaction to anesthesia    mom has n/v  . Family history of breast cancer   . Family history of colon cancer   . Fibromyalgia   . Fibromyalgia   . Gait abnormality 10/29/2017  . Genetic testing 09/08/2015   Negative genetic testing on the Breast/High Moderate Risk panel. The Breast High/Moderate Risk gene panel offered  by [facility] includes sequencing and deletion/duplication analysis of the following 9 genes: ATM, BRCA1, BRCA2, CDH1, CHEK2, PALB2, PTEN, STK11, and TP53. The report date is September 08, 2015.  The Rest of the Comprehensive Cancer panel has been reflexed and will be available in 2-3 weeks.  POLD1 c.208G>T VUS found on the Remainder of the Comprehensive cancer panel.  The Comprehensive Cancer Panel offered by [facility] includes sequencing and/or deletion duplication testing of the following 32 genes: APC, ATM, AXIN2, BARD1, BMPR1A, BRCA1, BRCA2, BRIP1, CDH1, CDK4, CDKN2A, CHEK2, EPCAM, FANCC, MLH1, MSH2, MSH6, MUTYH, NBN, PALB2, PMS2, POLD1, POLE, PTEN, RAD51C, RAD51D, SCG5/GREM1, SMAD4, STK11, TP53, VHL, and XRCC2.   The report date is 09/12/2015.   Marland Kitchen GERD (gastroesophageal reflux disease)    none recent  . H/O acute renal failure 09/22/15   admitted to [facility]  . Headache    occipital and temporal  . History of hiatal hernia   . Hyperlipemia   . Hyperlipidemia   . Hypertension   . Irritable bowel syndrome (IBS)   . MGUS (monoclonal gammopathy of unknown significance) 03/15/2016  . Neuromuscular disorder (HCC)    fibromyalgia  . Occasional tremors   . OSA (obstructive sleep apnea)    uses cpap with humidification- auto   . Personal history of radiation therapy   . PONV (postoperative nausea and vomiting)   . Renal cell carcinoma ([place])   . Tremor, essential 10/29/2017    Past Surgical History:  Procedure Laterality Date  . ABDOMINAL HYSTERECTOMY     adenomyosis  . ABDOMINAL HYSTERECTOMY    . BLADDER SURGERY    . BONE GRAFT HIP ILIAC CREST    . BREAST BIOPSY Bilateral   . BREAST LUMPECTOMY    . BREAST SURGERY    . CHOLECYSTECTOMY    . FRACTURE SURGERY Right    wrist  . KNEE ARTHROSCOPY    . KNEE ARTHROSCOPY W/ ACL RECONSTRUCTION     right  . RADIOACTIVE SEED GUIDED PARTIAL MASTECTOMY WITH AXILLARY SENTINEL LYMPH NODE BIOPSY Right 10/13/2015   Procedure: RADIOACTIVE SEED GUIDED  PARTIAL MASTECTOMY WITH AXILLARY SENTINEL LYMPH NODE BIOPSY;  Surgeon: Erroll Luna, MD;  Location: [place];  Service: General;  Laterality: Right;  . right carpal and cubital tunnel release     . ROBOTIC ASSITED PARTIAL NEPHRECTOMY Left 06/19/2018   Procedure: XI ROBOTIC ASSITED PARTIAL NEPHRECTOMY;  Surgeon: Ardis Hughs, MD;  Location: [facility];  Service: Urology;  Laterality: Left;  . WRIST SURGERY      Current Outpatient Medications  Medication Sig Dispense Refill  . calcium carbonate (TUMS - DOSED IN MG ELEMENTAL CALCIUM) 500 MG chewable tablet Chew 1 tablet (200 mg of elemental calcium total) by mouth 2 (two) times daily with  breakfast and lunch.    Marland Kitchen DILT-XR 120 MG 24 hr capsule Take 120 mg by mouth daily.   0  . FLUoxetine (PROZAC) 20 MG capsule Take 60 mg by mouth daily.  12  . furosemide (LASIX) 20 MG tablet Take 20 mg by mouth daily. PRN    . HYDROcodone-acetaminophen (NORCO) 7.5-325 MG tablet Take 1 tablet by mouth 3 (three) times daily as needed. for pain    . losartan (COZAAR) 25 MG tablet Take 25 mg by mouth daily.  0  . OVER THE COUNTER MEDICATION Take 1 capsule by mouth daily as needed. Diphenhydramate Over The Counter Anti-emetic    . pramipexole (MIRAPEX) 0.125 MG tablet TAKE ONE TABLET BY MOUTH DAILY AT DINNERTIME AND TAKE TWO TABLETS BY MOUTH AT BEDTIME 90 tablet 0  . pravastatin (PRAVACHOL) 10 MG tablet Take 10 mg by mouth daily.    Marland Kitchen topiramate (TOPAMAX) 50 MG tablet 2 tablets at night for 2 weeks, then take one in the morning and 2 at night 90 tablet 3  . Vitamin D, Ergocalciferol, (DRISDOL) 50000 units CAPS capsule Take 50,000 Units by mouth every 7 (seven) days. Take 1 tablet (50,000 units) by mouth every Thursday    . cyclobenzaprine (FLEXERIL) 10 MG tablet TAKE 1 TABLET BY MOUTH THREE TIMES DAILY FOR MUSCLE SPASM     No current facility-administered medications for this visit.     Allergies as of 09/16/2019 - Review Complete 09/16/2019  Allergen Reaction  Noted  . Ramipril Nausea And Vomiting and Other (See Comments) 02/12/2012  . Shrimp [shellfish allergy] Anaphylaxis and Swelling 02/12/2012  . Minocycline hcl Hives 02/12/2012  . Altace [ramipril] Nausea And Vomiting and Nausea Only 01/18/2018  . Lyrica [pregabalin] Nausea And Vomiting 01/18/2018  . Other  07/23/2018  . Adhesive [tape] Rash 10/11/2015  . Bactrim [sulfamethoxazole-trimethoprim] Other (See Comments) 10/11/2015  . Drixoral [brompheniramine-pseudoeph] Rash 02/12/2012  . Erythromycin Other (See Comments) 02/12/2012  . Minocin [minocycline hcl] Nausea And Vomiting and Rash 01/18/2018  . Septra [sulfamethoxazole-trimethoprim] Rash 01/18/2018  . Sinutab [chlorphen-pseudoephed-apap] Rash 02/12/2012    Family History  Problem Relation Age of Onset  . Hypertension Mother   . Autoimmune disease Mother        lichen planus  . Eczema Mother   . Parkinson's disease Mother   . Mental illness Mother        bipolar  . Heart disease Maternal Aunt   . Lung cancer Paternal Aunt   . Breast cancer Paternal Aunt        dx in his 63s  . Parkinsonism Paternal Aunt   . Lung cancer Paternal Grandfather   . Colon cancer Paternal Uncle        dx in his 61s  . Colon cancer Paternal Uncle        dx in his 56s  . Melanoma Father 107  . Psoriasis Father   . Hypertension Father   . Heart disease Father   . Breast cancer Sister        dx in her 38s  . Lupus Sister   . COPD Maternal Uncle   . Skin cancer Maternal Uncle   . Colon cancer Paternal Uncle        dx in his 93s  . Mental illness Son        bipolar  . Polycystic ovary syndrome Other     Social History   Socioeconomic History  . Marital status: Married    Spouse name: Psychologist, clinical  .  Number of children: 2  . Years of education: 69  . Highest education level: Not on file  Occupational History  . Occupation: physician office  Social Needs  . Financial resource strain: Not on file  . Food insecurity    Worry: Not on file     Inability: Not on file  . Transportation needs    Medical: Not on file    Non-medical: Not on file  Tobacco Use  . Smoking status: Never Smoker  . Smokeless tobacco: Never Used  Substance and Sexual Activity  . Alcohol use: Never    Frequency: Never    Comment: maybe one a year  . Drug use: Never  . Sexual activity: Yes    Birth control/protection: Surgical  Lifestyle  . Physical activity    Days per week: Not on file    Minutes per session: Not on file  . Stress: Not on file  Relationships  . Social Herbalist on phone: Not on file    Gets together: Not on file    Attends religious service: Not on file    Active member of club or organization: Not on file    Attends meetings of clubs or organizations: Not on file    Relationship status: Not on file  Other Topics Concern  . Not on file  Social History Narrative   ** Merged History Encounter **       Lives with husband Husband on disability Children grown and moved worries about family illness- mother and sister Caffeine use:      Review of Systems: Gen: Denies fever, chills, lightheadedness, dizziness, or feeling like she will pass out. CV: Denies chest pain or heart palpitations Resp: Denies dyspnea at rest or cough GI: See HPI Derm: Denies rash Psych: Denies depression, anxiety.  Symptoms are well controlled with medication. Heme: Easy bruising  Physical Exam: BP 124/85   Pulse 83   Temp (!) 96.9 F (36.1 C) (Oral)   Ht _0  (1.727 m)   Wt 234 lb 6.4 oz (106.3 kg)   BMI 35.64 kg/m  General:   Alert and oriented. No distress noted. Pleasant and cooperative.  Head:  Normocephalic and atraumatic. Eyes:  Conjuctiva clear without scleral icterus. Heart:  S1, S2 present without murmurs appreciated. Lungs:  Clear to auscultation bilaterally. No wheezes, rales, or rhonchi. No distress.  Abdomen:  +BS, soft, non-distended.  Minimal tenderness to palpation in the epigastric area.  No rebound or  guarding. No HSM or masses noted. Msk:  Symmetrical without gross deformities. Normal posture. Extremities:  Without edema. Neurologic:  Alert and  oriented x4 Psych: Normal mood and affect.

## 2019-09-16 ENCOUNTER — Ambulatory Visit: Payer: BC Managed Care – PPO | Admitting: Gastroenterology

## 2019-09-16 ENCOUNTER — Other Ambulatory Visit: Payer: Self-pay

## 2019-09-16 ENCOUNTER — Encounter: Payer: Self-pay | Admitting: Gastroenterology

## 2019-09-16 VITALS — BP 124/85 | HR 83 | Temp 96.9°F | Ht 68.0 in | Wt 234.4 lb

## 2019-09-16 DIAGNOSIS — K5900 Constipation, unspecified: Secondary | ICD-10-CM

## 2019-09-16 DIAGNOSIS — A0472 Enterocolitis due to Clostridium difficile, not specified as recurrent: Secondary | ICD-10-CM | POA: Diagnosis not present

## 2019-09-16 DIAGNOSIS — K219 Gastro-esophageal reflux disease without esophagitis: Secondary | ICD-10-CM

## 2019-09-16 DIAGNOSIS — R110 Nausea: Secondary | ICD-10-CM

## 2019-09-16 MED ORDER — PANTOPRAZOLE SODIUM 40 MG PO TBEC: 40.0000 mg | DELAYED_RELEASE_TABLET | Freq: Every day | ORAL | 5 refills | Status: DC

## 2019-09-16 NOTE — Patient Instructions (Addendum)
I am sending in Protonix 40 mg to your pharmacy.  You should take this once daily 30 minutes before breakfast.  Please follow a GERD diet.  Avoid fried, fatty, greasy, spicy, and citrus foods.  Avoid caffeine, carbonated beverages, and chocolate.  Do not eat within 3 hours of laying down.  Sleep with the head of your bed elevated at 6 inches.  See handout below.  Use MiraLAX 1 capful daily as needed for constipation.   We will plan to see you back in 3 months.  Call if you have questions or concerns prior.  Aliene Altes, PA-C [facility]     Food Choices for Gastroesophageal Reflux Disease, Adult When you have gastroesophageal reflux disease (GERD), the foods you eat and your eating habits are very important. Choosing the right foods can help ease your discomfort. Think about working with a nutrition specialist (dietitian) to help you make good choices. What are tips for following this plan?  Meals  Choose healthy foods that are low in fat, such as fruits, vegetables, whole grains, low-fat dairy products, and lean meat, fish, and poultry.  Eat small meals often instead of 3 large meals a day. Eat your meals slowly, and in a place where you are relaxed. Avoid bending over or lying down until 2-3 hours after eating.  Avoid eating meals 2-3 hours before bed.  Avoid drinking a lot of liquid with meals.  Cook foods using methods other than frying. Bake, grill, or broil food instead.  Avoid or limit: ? Chocolate. ? Peppermint or spearmint. ? Alcohol. ? Pepper. ? Black and decaffeinated coffee. ? Black and decaffeinated tea. ? Bubbly (carbonated) soft drinks. ? Caffeinated energy drinks and soft drinks.  Limit high-fat foods such as: ? Fatty meat or fried foods. ? Whole milk, cream, butter, or ice cream. ? Nuts and nut butters. ? Pastries, donuts, and sweets made with butter or shortening.  Avoid foods that cause symptoms. These foods may be different for  everyone. Common foods that cause symptoms include: ? Tomatoes. ? Oranges, lemons, and limes. ? Peppers. ? Spicy food. ? Onions and garlic. ? Vinegar. Lifestyle  Maintain a healthy weight. Ask your doctor what weight is healthy for you. If you need to lose weight, work with your doctor to do so safely.  Exercise for at least 30 minutes for 5 or more days each week, or as told by your doctor.  Wear loose-fitting clothes.  Do not smoke. If you need help quitting, ask your doctor.  Sleep with the head of your bed higher than your feet. Use a wedge under the mattress or blocks under the bed frame to raise the head of the bed. Summary  When you have gastroesophageal reflux disease (GERD), food and lifestyle choices are very important in easing your symptoms.  Eat small meals often instead of 3 large meals a day. Eat your meals slowly, and in a place where you are relaxed.  Limit high-fat foods such as fatty meat or fried foods.  Avoid bending over or lying down until 2-3 hours after eating.  Avoid peppermint and spearmint, caffeine, alcohol, and chocolate. This information is not intended to replace advice given to you by your health care provider. Make sure you discuss any questions you have with your health care provider. Document Released: 04/08/2012 Document Revised: 01/29/2019 Document Reviewed: 11/13/2016 Elsevier Patient Education  2020 [employer].

## 2019-09-17 DIAGNOSIS — K5900 Constipation, unspecified: Secondary | ICD-10-CM | POA: Insufficient documentation

## 2019-09-17 NOTE — Assessment & Plan Note (Addendum)
Daily nausea without vomiting, worse in the mornings for the last 3 months with associated worsening indigestion, GERD symptoms, and epigastric burning.  She has been taking Tums frequently to control her reflux symptoms.  Not currently on a PPI.  She is also had decreased appetite, likely secondary to nausea with associated 8 pound weight loss over the last 3 months.  Denies dysphagia, melena, or bright red blood per rectum.  No NSAIDs. She reports history of H. pylori in her 44s, treated with Prevpac, and last EGD within the last 3 years at [place] with biopsies negative for celiac disease and H. Pylori. I suspect her nausea is likely secondary to her indigestion and GERD symptoms, possible gastritis.   Start Protonix 40 mg daily 30 minutes before breakfast. Counseled on a GERD lifestyle and diet.  Handout provided. Request records from [place]. Follow-up in 3 months.

## 2019-09-17 NOTE — Assessment & Plan Note (Addendum)
Symptoms not ideally controlled.  Patient has been using Tums frequently to manage her indigestion and acid reflux symptoms.  She has associated daily nausea that is worse in the morning without vomiting and intermittent epigastric burning.  Also reports decreased appetite, likely secondary to nausea, and 8 pound weight loss over the last 3 months.  Denies dysphagia, bright red blood per rectum, melena, or regular NSAID use.  Reports history of H. pylori in her 60s, treated with Prevpac.  Reports last EGD within the last 3 years at [place] with biopsies negative for celiac disease and H. pylori. I suspect her nausea is likely secondary to her worsening reflux symptoms as they are worse in the morning. She may also have gastritis.   Start Protonix 40 mg daily 30 minutes before breakfast. Counseled on a GERD diet and lifestyle.  Handout provided. Request records from [place]. Follow-up in 3 months.

## 2019-09-17 NOTE — Assessment & Plan Note (Addendum)
52 year old female with history of C. difficile infection in August 2019.  She was treated with vancomycin and had resolution of diarrhea.  Over the last year, she has had a few intermittent episodes of diarrhea but nothing that has been persistent.  3 months ago, she reports 1 day of watery diarrhea with associated abdominal pain that lasted 1-2 weeks and nausea.  Since then, she has been on 3 rounds of antibiotics, doxycycline x2 for Lyme's disease and a course of Cipro for recent UTI; however, BMs have returned to normal and she is without any abdominal pain.  She does continue with daily nausea, worse in the morning; however, I suspect this is likely related to worsening GERD or possible gastritis as discussed above.  Suspect her acute episode of diarrhea and abdominal pain were likely an acute viral gastroenteritis.  Advise she continue to monitor for return of diarrhea.

## 2019-09-17 NOTE — Assessment & Plan Note (Signed)
BMs are typically daily.  Consistency varies.  Most of the time, BMs are soft and formed but at times may be harder or looser.  Currently without a BM for the last 3 days.  Denies BRBPR, melena, or abdominal pain.  Reports colonoscopy within the last 4 years at [place] without polyps.  Advise she add MiraLAX 1 capful daily as needed for constipation. Request [facility] records from [facility]. Follow-up in 3 months.

## 2019-09-18 ENCOUNTER — Other Ambulatory Visit: Payer: Self-pay | Admitting: Neurology

## 2019-09-22 ENCOUNTER — Other Ambulatory Visit: Payer: Self-pay

## 2019-09-22 MED ORDER — PRAMIPEXOLE DIHYDROCHLORIDE 0.125 MG PO TABS: ORAL_TABLET | ORAL | 3 refills | Status: DC

## 2019-09-24 DIAGNOSIS — H9313 Tinnitus, bilateral: Secondary | ICD-10-CM | POA: Diagnosis not present

## 2019-09-24 DIAGNOSIS — H903 Sensorineural hearing loss, bilateral: Secondary | ICD-10-CM | POA: Diagnosis not present

## 2019-09-26 DIAGNOSIS — M545 Low back pain: Secondary | ICD-10-CM | POA: Diagnosis not present

## 2019-09-26 DIAGNOSIS — N3001 Acute cystitis with hematuria: Secondary | ICD-10-CM | POA: Diagnosis not present

## 2019-09-28 ENCOUNTER — Other Ambulatory Visit: Payer: Self-pay | Admitting: Internal Medicine

## 2019-09-28 ENCOUNTER — Telehealth: Payer: Self-pay | Admitting: Gastroenterology

## 2019-09-28 ENCOUNTER — Other Ambulatory Visit (HOSPITAL_COMMUNITY): Payer: Self-pay | Admitting: Internal Medicine

## 2019-09-28 ENCOUNTER — Encounter: Payer: Self-pay | Admitting: Gastroenterology

## 2019-09-28 DIAGNOSIS — N3001 Acute cystitis with hematuria: Secondary | ICD-10-CM

## 2019-09-28 DIAGNOSIS — Z85528 Personal history of other malignant neoplasm of kidney: Secondary | ICD-10-CM

## 2019-09-28 NOTE — Telephone Encounter (Signed)
Received and reviewed last colonoscopy and EGD reports from [facility].  Colonoscopy with propofol dated 05/14/2016 for screening purposes.  Impression with entire examined colon normal on direct and retroflexion views.  Recommendations to repeat colonoscopy in 10 years.  EGD with propofol dated 03/24/2018 for epigastric abdominal pain, suspected gastroesophageal reflux disease, abdominal bloating.  Impression: Normal esophagus, Z-line regular, erythematous mucosa in the gastric body biopsied, erythematous mucosa in the antrum biopsied, normal examined duodenum biopsied. Pathology: Duodenal biopsy with small bowel mucosa with no significant pathologic changes.  Negative for celiac disease.  Giardia lambila organisms not present.  Gastric body and gastric antrum biopsies with chronic inactive gastritis.  Negative for H. pylori.  No further recommendations at this time.  Requested records for [place] to have on file and will have them scanned into patient's chart.

## 2019-09-30 ENCOUNTER — Ambulatory Visit (HOSPITAL_COMMUNITY)
Admission: RE | Admit: 2019-09-30 | Discharge: 2019-09-30 | Disposition: A | Discharge status: Home / Self Care | Payer: BC Managed Care – PPO | Source: Ambulatory Visit | Attending: Internal Medicine | Admitting: Internal Medicine

## 2019-09-30 ENCOUNTER — Other Ambulatory Visit: Payer: Self-pay

## 2019-09-30 ENCOUNTER — Other Ambulatory Visit (HOSPITAL_COMMUNITY): Payer: Self-pay | Admitting: Internal Medicine

## 2019-09-30 DIAGNOSIS — Z85528 Personal history of other malignant neoplasm of kidney: Secondary | ICD-10-CM | POA: Diagnosis not present

## 2019-09-30 DIAGNOSIS — N3001 Acute cystitis with hematuria: Secondary | ICD-10-CM | POA: Diagnosis not present

## 2019-09-30 DIAGNOSIS — N289 Disorder of kidney and ureter, unspecified: Secondary | ICD-10-CM | POA: Diagnosis not present

## 2019-09-30 DIAGNOSIS — R319 Hematuria, unspecified: Secondary | ICD-10-CM | POA: Diagnosis not present

## 2019-09-30 MED ORDER — IOHEXOL 300 MG/ML  SOLN
150.0000 mL | Freq: Once | INTRAMUSCULAR | Status: AC | PRN
  Administered 2019-09-30: 09:00:00 125 mL via INTRAVENOUS

## 2019-10-02 DIAGNOSIS — M542 Cervicalgia: Secondary | ICD-10-CM | POA: Diagnosis not present

## 2019-10-19 DIAGNOSIS — Z0271 Encounter for disability determination: Secondary | ICD-10-CM

## 2019-10-22 ENCOUNTER — Other Ambulatory Visit: Payer: Self-pay | Admitting: Neurology

## 2019-10-23 DIAGNOSIS — G459 Transient cerebral ischemic attack, unspecified: Secondary | ICD-10-CM

## 2019-10-23 HISTORY — DX: Transient cerebral ischemic attack, unspecified: G45.9

## 2019-10-29 ENCOUNTER — Other Ambulatory Visit: Payer: Self-pay

## 2019-10-29 ENCOUNTER — Ambulatory Visit: Payer: 59 | Admitting: Orthotics

## 2019-10-29 DIAGNOSIS — M25571 Pain in right ankle and joints of right foot: Secondary | ICD-10-CM

## 2019-10-29 DIAGNOSIS — M779 Enthesopathy, unspecified: Secondary | ICD-10-CM

## 2019-10-29 NOTE — Progress Notes (Signed)
Patient came in today to pick up custom made foot orthotics.  The goals were accomplished and the patient reported no dissatisfaction with said orthotics.  Patient was advised of breakin period and how to report any issues. 

## 2019-12-14 ENCOUNTER — Ambulatory Visit: Payer: BC Managed Care – PPO | Admitting: Neurology

## 2019-12-16 ENCOUNTER — Ambulatory Visit: Payer: BC Managed Care – PPO | Admitting: Gastroenterology

## 2019-12-21 ENCOUNTER — Encounter: Payer: Self-pay | Admitting: Podiatry

## 2019-12-21 ENCOUNTER — Ambulatory Visit (INDEPENDENT_AMBULATORY_CARE_PROVIDER_SITE_OTHER): Payer: 59

## 2019-12-21 ENCOUNTER — Other Ambulatory Visit: Payer: Self-pay

## 2019-12-21 ENCOUNTER — Ambulatory Visit (INDEPENDENT_AMBULATORY_CARE_PROVIDER_SITE_OTHER): Payer: 59 | Admitting: Podiatry

## 2019-12-21 ENCOUNTER — Other Ambulatory Visit: Payer: Self-pay | Admitting: Podiatry

## 2019-12-21 VITALS — Temp 97.4°F

## 2019-12-21 DIAGNOSIS — M7671 Peroneal tendinitis, right leg: Secondary | ICD-10-CM

## 2019-12-21 DIAGNOSIS — M779 Enthesopathy, unspecified: Secondary | ICD-10-CM

## 2019-12-23 ENCOUNTER — Inpatient Hospital Stay (HOSPITAL_COMMUNITY): Payer: 59 | Attending: Hematology

## 2019-12-23 DIAGNOSIS — Z85528 Personal history of other malignant neoplasm of kidney: Secondary | ICD-10-CM | POA: Diagnosis not present

## 2019-12-23 DIAGNOSIS — Z800 Family history of malignant neoplasm of digestive organs: Secondary | ICD-10-CM | POA: Diagnosis not present

## 2019-12-23 DIAGNOSIS — Z808 Family history of malignant neoplasm of other organs or systems: Secondary | ICD-10-CM | POA: Diagnosis not present

## 2019-12-23 DIAGNOSIS — Z86000 Personal history of in-situ neoplasm of breast: Secondary | ICD-10-CM | POA: Insufficient documentation

## 2019-12-23 DIAGNOSIS — Z801 Family history of malignant neoplasm of trachea, bronchus and lung: Secondary | ICD-10-CM | POA: Diagnosis not present

## 2019-12-23 DIAGNOSIS — Z803 Family history of malignant neoplasm of breast: Secondary | ICD-10-CM | POA: Diagnosis not present

## 2019-12-23 DIAGNOSIS — Z923 Personal history of irradiation: Secondary | ICD-10-CM | POA: Insufficient documentation

## 2019-12-23 DIAGNOSIS — Z90710 Acquired absence of both cervix and uterus: Secondary | ICD-10-CM | POA: Insufficient documentation

## 2019-12-23 DIAGNOSIS — R779 Abnormality of plasma protein, unspecified: Secondary | ICD-10-CM | POA: Diagnosis not present

## 2019-12-23 DIAGNOSIS — Z905 Acquired absence of kidney: Secondary | ICD-10-CM | POA: Diagnosis not present

## 2019-12-23 DIAGNOSIS — C50311 Malignant neoplasm of lower-inner quadrant of right female breast: Secondary | ICD-10-CM

## 2019-12-23 DIAGNOSIS — D472 Monoclonal gammopathy: Secondary | ICD-10-CM

## 2019-12-23 LAB — CBC WITH DIFFERENTIAL/PLATELET
Abs Immature Granulocytes: 0.03 10*3/uL (ref 0.00–0.07)
Basophils Absolute: 0 10*3/uL (ref 0.0–0.1)
Basophils Relative: 0 %
Eosinophils Absolute: 0.2 10*3/uL (ref 0.0–0.5)
Eosinophils Relative: 3 %
HCT: 42.7 % (ref 36.0–46.0)
Hemoglobin: 13.8 g/dL (ref 12.0–15.0)
Immature Granulocytes: 0 %
Lymphocytes Relative: 22 %
Lymphs Abs: 1.8 10*3/uL (ref 0.7–4.0)
MCH: 31.2 pg (ref 26.0–34.0)
MCHC: 32.3 g/dL (ref 30.0–36.0)
MCV: 96.6 fL (ref 80.0–100.0)
Monocytes Absolute: 0.6 10*3/uL (ref 0.1–1.0)
Monocytes Relative: 7 %
Neutro Abs: 5.5 10*3/uL (ref 1.7–7.7)
Neutrophils Relative %: 68 %
Platelets: 209 10*3/uL (ref 150–400)
RBC: 4.42 MIL/uL (ref 3.87–5.11)
RDW: 12.7 % (ref 11.5–15.5)
WBC: 8.1 10*3/uL (ref 4.0–10.5)
nRBC: 0 % (ref 0.0–0.2)

## 2019-12-23 LAB — COMPREHENSIVE METABOLIC PANEL
ALT: 25 U/L (ref 0–44)
AST: 18 U/L (ref 15–41)
Albumin: 3.9 g/dL (ref 3.5–5.0)
Alkaline Phosphatase: 59 U/L (ref 38–126)
Anion gap: 9 (ref 5–15)
BUN: 21 mg/dL — ABNORMAL HIGH (ref 6–20)
CO2: 22 mmol/L (ref 22–32)
Calcium: 8.8 mg/dL — ABNORMAL LOW (ref 8.9–10.3)
Chloride: 108 mmol/L (ref 98–111)
Creatinine, Ser: 0.78 mg/dL (ref 0.44–1.00)
GFR calc Af Amer: >60 mL/min (ref >60)
GFR calc non Af Amer: >60 mL/min (ref >60)
Glucose, Bld: 132 mg/dL — ABNORMAL HIGH (ref 70–99)
Potassium: 3.4 mmol/L — ABNORMAL LOW (ref 3.5–5.1)
Sodium: 139 mmol/L (ref 135–145)
Total Bilirubin: 0.5 mg/dL (ref 0.3–1.2)
Total Protein: 7.5 g/dL (ref 6.5–8.1)

## 2019-12-23 LAB — LACTATE DEHYDROGENASE: LDH: 148 U/L (ref 98–192)

## 2019-12-24 ENCOUNTER — Other Ambulatory Visit: Payer: Self-pay | Admitting: Neurology

## 2019-12-24 LAB — KAPPA/LAMBDA LIGHT CHAINS
Kappa free light chain: 25.5 mg/L — ABNORMAL HIGH (ref 3.3–19.4)
Kappa, lambda light chain ratio: 1.37 (ref 0.26–1.65)
Lambda free light chains: 18.6 mg/L (ref 5.7–26.3)

## 2019-12-25 ENCOUNTER — Other Ambulatory Visit (HOSPITAL_COMMUNITY): Payer: Self-pay | Admitting: *Deleted

## 2019-12-25 DIAGNOSIS — Z171 Estrogen receptor negative status [ER-]: Secondary | ICD-10-CM

## 2019-12-25 DIAGNOSIS — C50311 Malignant neoplasm of lower-inner quadrant of right female breast: Secondary | ICD-10-CM

## 2019-12-25 DIAGNOSIS — D472 Monoclonal gammopathy: Secondary | ICD-10-CM

## 2019-12-25 LAB — PROTEIN ELECTROPHORESIS, SERUM
A/G Ratio: 1.3 (ref 0.7–1.7)
Albumin ELP: 3.8 g/dL (ref 2.9–4.4)
Alpha-1-Globulin: 0.2 g/dL (ref 0.0–0.4)
Alpha-2-Globulin: 0.7 g/dL (ref 0.4–1.0)
Beta Globulin: 1.2 g/dL (ref 0.7–1.3)
Gamma Globulin: 0.7 g/dL (ref 0.4–1.8)
Globulin, Total: 2.9 g/dL (ref 2.2–3.9)
Total Protein ELP: 6.7 g/dL (ref 6.0–8.5)

## 2019-12-28 ENCOUNTER — Ambulatory Visit (INDEPENDENT_AMBULATORY_CARE_PROVIDER_SITE_OTHER): Payer: 59 | Admitting: Neurology

## 2019-12-28 ENCOUNTER — Encounter: Payer: Self-pay | Admitting: Neurology

## 2019-12-28 VITALS — BP 123/81 | HR 77 | Temp 97.5°F | Ht 68.0 in | Wt 240.0 lb

## 2019-12-28 DIAGNOSIS — R519 Headache, unspecified: Secondary | ICD-10-CM

## 2019-12-28 DIAGNOSIS — M797 Fibromyalgia: Secondary | ICD-10-CM

## 2019-12-28 DIAGNOSIS — G250 Essential tremor: Secondary | ICD-10-CM

## 2019-12-28 MED ORDER — TOPIRAMATE 50 MG PO TABS: ORAL_TABLET | ORAL | 1 refills | Status: DC

## 2019-12-28 NOTE — Patient Instructions (Addendum)
It was great to meet you today :) Let's try an increase in Topamax 100 mg twice daily Let us know how you are doing in a few weeks See you back in 6 months

## 2019-12-28 NOTE — Progress Notes (Signed)
PATIENT: Sarah Cooley DOB: 1966-02-25  REASON FOR VISIT: follow up HISTORY FROM: patient  HISTORY OF PRESENT ILLNESS: Today 12/28/19  Sarah Cooley is a 54 year old female with history of obesity, and fibromyalgia.  She has history of Lyme's disease.  She is treated with Topamax for headaches.  MRI of the lumbar spine was relatively unremarkable, no surgically amenable issues.  Topamax has been helpful, of recent, feels she has been having more headaches, maybe 6 times a week, will take Tylenol, if really bad will take Norco.  Headache is occipitally, wraps around bilaterally.  Denies photophobia, phonophobia, nausea.  Has had history of migraines with aura, but these are different headaches.  She still has back pain, pain on the left leg, at night the left leg will jerk.  She has tremors, both hands, right leg, and her head.  Has a strong family history of tremor.  At night, may feel vibration in her body.  She has chronic double vision, wears prisms, last eye exam was stable, corrected as much as possible.  She sees an orthopedic, for pinched cervical nerve, has had epidural injection with good benefit.  She has monoclonal gammopathy, sees the cancer center.  She presents today for follow-up unaccompanied.  HISTORY 06/02/2019 Sarah Cooley: Sarah Cooley is a 54 year old right-handed white female with a history of obesity, and a history of fibromyalgia since 2004.  The patient has been seen by at least 2 different rheumatology physicians and had been given the diagnosis of fibromyalgia.  The patient recently was treated for Lyme disease, she had a total of 2-1/2 months of doxycycline.  The patient continues to have ongoing pain in the joints of the arms and legs and has ongoing fatigue issues, she is not sleeping well.  She has not responded to medication such as gabapentin, Lyrica, and Cymbalta previously.  She is on Topamax for headaches with some benefit, but she is still having 3-4 headaches a  week.  She is tolerating the Topamax well at the 75 mg daily dose.  MRI of the lumbar spine was ordered on the last visit but never done because of financial concerns.  In the past, the patient has tried CBD oil without much benefit.  She returns to this office for an evaluation.   REVIEW OF SYSTEMS: Out of a complete 14 system review of symptoms, the patient complains only of the following symptoms, and all other reviewed systems are negative.  Headache, back pain  ALLERGIES: Allergies  Allergen Reactions  . Ramipril Nausea And Vomiting and Other (See Comments)    Ramipril--Caused Pancreatitis  . Shrimp [Shellfish Allergy] Anaphylaxis and Swelling  . Minocycline Hcl Hives  . Other Rash    ADHESIVE GLUE USED FOR INCISIONS  Anti-inflammatory medicine - pt stated, "Upsets my GI tract" Please use paper tape Please use paper tape Please use paper tape Patient is allergic to many adhesives   . Altace [Ramipril] Nausea And Vomiting and Nausea Only    Drug induced pancreatitis   . Lyrica [Pregabalin] Nausea And Vomiting  . Adhesive [Tape] Rash    Please use paper tape Patient is allergic to many adhesives   . Bactrim [Sulfamethoxazole-Trimethoprim] Other (See Comments)    May have caused kidney issues  . Drixoral [Brompheniramine-Pseudoeph] Rash  . Erythromycin Other (See Comments)    GI- Upset / severe abdominal pain  . Minocin [Minocycline Hcl] Nausea And Vomiting and Rash  . Septra [Sulfamethoxazole-Trimethoprim] Rash  . Sinutab [Chlorphen-Pseudoephed-Apap] Rash  HOME MEDICATIONS: Outpatient Medications Prior to Visit  Medication Sig Dispense Refill  . calcium carbonate (TUMS - DOSED IN MG ELEMENTAL CALCIUM) 500 MG chewable tablet Chew 1 tablet (200 mg of elemental calcium total) by mouth 2 (two) times daily with breakfast and lunch.    Marland Kitchen DILT-XR 120 MG 24 hr capsule Take 120 mg by mouth daily.   0  . FLUoxetine (PROZAC) 20 MG capsule Take 60 mg by mouth daily.  12  .  furosemide (LASIX) 20 MG tablet Take 20 mg by mouth daily. PRN    . HYDROcodone-acetaminophen (NORCO) 7.5-325 MG tablet Take 1 tablet by mouth 3 (three) times daily as needed. for pain    . losartan (COZAAR) 25 MG tablet Take 25 mg by mouth daily.  0  . OVER THE COUNTER MEDICATION Take 1 capsule by mouth daily as needed. Diphenhydramate Over The Counter Anti-emetic    . pantoprazole (PROTONIX) 40 MG tablet Take 1 tablet (40 mg total) by mouth daily before breakfast. 30 tablet 5  . pramipexole (MIRAPEX) 0.125 MG tablet TAKE ONE TABLET BY MOUTH DAILY AT DINNERTIME AND TAKE TWO TABLETS BY MOUTH AT BEDTIME 90 tablet 3  . pravastatin (PRAVACHOL) 10 MG tablet Take 10 mg by mouth daily.    . Vitamin D, Ergocalciferol, (DRISDOL) 50000 units CAPS capsule Take 50,000 Units by mouth every 7 (seven) days. Take 1 tablet (50,000 units) by mouth every Thursday    . topiramate (TOPAMAX) 50 MG tablet TAKE 1 TABLET BY MOUTH EVERY MORNING AND TAKE 2 TABLETS BY MOUTH EVERY EVENING 90 tablet 0   No facility-administered medications prior to visit.    PAST MEDICAL HISTORY: Past Medical History:  Diagnosis Date  . Allergy    seasonal  . Anxiety   . Arthritis   . Balance problem   . Brain aneurysm    2 mm ACA region aneurysm by 09/21/15 MRA  . Breast cancer ([place])   . Breast cancer of lower-inner quadrant of right female breast ([place]) 08/26/2015  . Chronic kidney disease    acute renal failure one occasion  . Chronic low back pain 12/02/2018  . Complication of anesthesia    heart rate drops and O2 sats drop  . Constipation   . Depression   . Diplopia 10/29/2017  . Dizziness 10/29/2017  . Elevated liver function tests   . Family history of adverse reaction to anesthesia    mom has n/v  . Family history of breast cancer   . Family history of colon cancer   . Fibromyalgia   . Fibromyalgia   . Gait abnormality 10/29/2017  . Genetic testing 09/08/2015   Negative genetic testing on the Breast/High Moderate Risk  panel. The Breast High/Moderate Risk gene panel offered by [facility] includes sequencing and deletion/duplication analysis of the following 9 genes: ATM, BRCA1, BRCA2, CDH1, CHEK2, PALB2, PTEN, STK11, and TP53. The report date is September 08, 2015.  The Rest of the Comprehensive Cancer panel has been reflexed and will be available in 2-3 weeks.  POLD1 c.208G>T VUS found on the Remainder of the Comprehensive cancer panel.  The Comprehensive Cancer Panel offered by [facility] includes sequencing and/or deletion duplication testing of the following 32 genes: APC, ATM, AXIN2, BARD1, BMPR1A, BRCA1, BRCA2, BRIP1, CDH1, CDK4, CDKN2A, CHEK2, EPCAM, FANCC, MLH1, MSH2, MSH6, MUTYH, NBN, PALB2, PMS2, POLD1, POLE, PTEN, RAD51C, RAD51D, SCG5/GREM1, SMAD4, STK11, TP53, VHL, and XRCC2.   The report date is 09/12/2015.   Marland Kitchen GERD (gastroesophageal reflux disease)  none recent  . H/O acute renal failure 09/22/15   admitted to [facility]  . Headache    occipital and temporal  . History of hiatal hernia   . Hyperlipemia   . Hyperlipidemia   . Hypertension   . Irritable bowel syndrome (IBS)   . Lyme disease   . MGUS (monoclonal gammopathy of unknown significance) 03/15/2016  . Neuromuscular disorder (HCC)    fibromyalgia  . Occasional tremors   . OSA (obstructive sleep apnea)    uses cpap with humidification- auto   . Personal history of radiation therapy   . PONV (postoperative nausea and vomiting)   . Renal cell carcinoma ([place])   . Tremor, essential 10/29/2017    PAST SURGICAL HISTORY: Past Surgical History:  Procedure Laterality Date  . ABDOMINAL HYSTERECTOMY     adenomyosis  . ABDOMINAL HYSTERECTOMY    . BLADDER SURGERY    . BONE GRAFT HIP ILIAC CREST    . BREAST BIOPSY Bilateral   . BREAST LUMPECTOMY    . BREAST SURGERY    . CHOLECYSTECTOMY    . COLONOSCOPY WITH PROPOFOL  05/14/2016   [facility]; entire examined colon normal.  Recommendations to repeat colonoscopy in 10 years for screening purposes.   . ESOPHAGOGASTRODUODENOSCOPY (EGD) WITH PROPOFOL  03/24/2018   [facility]; normal esophagus, erythematous mucosa in the gastric body and antrum s/p biopsy, normal examined duodenum s/p biopsy.  Pathology with benign duodenal biopsy.  Stomach biopsies with chronic inactive gastritis, negative for H. pylori.  . FRACTURE SURGERY Right    wrist  . KNEE ARTHROSCOPY    . KNEE ARTHROSCOPY W/ ACL RECONSTRUCTION     right  . RADIOACTIVE SEED GUIDED PARTIAL MASTECTOMY WITH AXILLARY SENTINEL LYMPH NODE BIOPSY Right 10/13/2015   Procedure: RADIOACTIVE SEED GUIDED PARTIAL MASTECTOMY WITH AXILLARY SENTINEL LYMPH NODE BIOPSY;  Surgeon: Erroll Luna, MD;  Location: [place];  Service: General;  Laterality: Right;  . right carpal and cubital tunnel release     . ROBOTIC ASSITED PARTIAL NEPHRECTOMY Left 06/19/2018   Procedure: XI ROBOTIC ASSITED PARTIAL NEPHRECTOMY;  Surgeon: Ardis Hughs, MD;  Location: [facility];  Service: Urology;  Laterality: Left;  . WRIST SURGERY      FAMILY HISTORY: Family History  Problem Relation Age of Onset  . Hypertension Mother   . Autoimmune disease Mother        lichen planus  . Eczema Mother   . Parkinson's disease Mother   . Mental illness Mother        bipolar  . Heart disease Maternal Aunt   . Lung cancer Paternal Aunt   . Breast cancer Paternal Aunt        dx in his 40s  . Parkinsonism Paternal Aunt   . Lung cancer Paternal Grandfather   . Colon cancer Paternal Uncle        dx in his 66s  . Colon cancer Paternal Uncle        dx in his 56s  . Melanoma Father 47  . Psoriasis Father   . Hypertension Father   . Heart disease Father   . Breast cancer Sister        dx in her 21s  . Lupus Sister   . COPD Maternal Uncle   . Skin cancer Maternal Uncle   . Colon cancer Paternal Uncle        dx in his 70s  . Mental illness Son        bipolar  .  Polycystic ovary syndrome Other     SOCIAL HISTORY: Social History   Socioeconomic History  . Marital status:  Married    Spouse name: Psychologist, clinical  . Number of children: 2  . Years of education: 79  . Highest education level: Not on file  Occupational History  . Occupation: physician office  Tobacco Use  . Smoking status: Never Smoker  . Smokeless tobacco: Never Used  Substance and Sexual Activity  . Alcohol use: Not Currently    Comment: maybe one a year  . Drug use: Never  . Sexual activity: Yes    Birth control/protection: Surgical  Other Topics Concern  . Not on file  Social History Narrative   ** Merged History Encounter **       Lives with husband Husband on disability Children grown and moved worries about family illness- mother and sister Caffeine use:     Social Determinants of Health   Financial Resource Strain:   . Difficulty of Paying Living Expenses: Not on file  Food Insecurity:   . Worried About Charity fundraiser in the Last Year: Not on file  . Ran Out of Food in the Last Year: Not on file  Transportation Needs:   . Lack of Transportation (Medical): Not on file  . Lack of Transportation (Non-Medical): Not on file  Physical Activity:   . Days of Exercise per Week: Not on file  . Minutes of Exercise per Session: Not on file  Stress:   . Feeling of Stress : Not on file  Social Connections:   . Frequency of Communication with Friends and Family: Not on file  . Frequency of Social Gatherings with Friends and Family: Not on file  . Attends Religious Services: Not on file  . Active Member of Clubs or Organizations: Not on file  . Attends Archivist Meetings: Not on file  . Marital Status: Not on file  Intimate Partner Violence:   . Fear of Current or Ex-Partner: Not on file  . Emotionally Abused: Not on file  . Physically Abused: Not on file  . Sexually Abused: Not on file   PHYSICAL EXAM  Vitals:   12/28/19 0923  BP: 123/81  Pulse: 77  Temp: (!) 97.5 F (36.4 C)  TempSrc: Oral  Weight: 240 lb (108.9 kg)  Height: 5' 8"  (1.727 m)   Body mass  index is 36.49 kg/m.  Generalized: Well developed, in no acute distress   Neurological examination  Mentation: Alert oriented to time, place, history taking. Follows all commands speech and language fluent Cranial nerve II-XII: Pupils were equal round reactive to light. Extraocular movements were full, visual field were full on confrontational test. Facial sensation and strength were normal. Head turning and shoulder shrug were normal and symmetric. Motor: Good strength of all extremities Sensory: Sensory testing is intact to soft touch on all 4 extremities. No evidence of extinction is noted.  Coordination: Cerebellar testing reveals good finger-nose-finger and heel-to-shin bilaterally.  Gait and station: Gait is normal. Tandem gait is slightly unsteady Reflexes: Deep tendon reflexes are symmetric  DIAGNOSTIC DATA (LABS, IMAGING, TESTING) - I reviewed patient records, labs, notes, testing and imaging myself where available.  Lab Results  Component Value Date   WBC 8.1 12/23/2019   HGB 13.8 12/23/2019   HCT 42.7 12/23/2019   MCV 96.6 12/23/2019   PLT 209 12/23/2019      Component Value Date/Time   NA 139 12/23/2019 0913   NA 138 08/31/2015 1217  K 3.4 (L) 12/23/2019 0913   K 2.6 (LL) 08/31/2015 1217   CL 108 12/23/2019 0913   CO2 22 12/23/2019 0913   CO2 27 08/31/2015 1217   GLUCOSE 132 (H) 12/23/2019 0913   GLUCOSE 114 08/31/2015 1217   BUN 21 (H) 12/23/2019 0913   BUN 10.3 08/31/2015 1217   CREATININE 0.78 12/23/2019 0913   CREATININE 0.8 08/31/2015 1217   CALCIUM 8.8 (L) 12/23/2019 0913   CALCIUM 9.7 08/31/2015 1217   PROT 7.5 12/23/2019 0913   PROT 7.8 08/31/2015 1217   ALBUMIN 3.9 12/23/2019 0913   ALBUMIN 3.9 08/31/2015 1217   AST 18 12/23/2019 0913   AST 24 08/31/2015 1217   ALT 25 12/23/2019 0913   ALT 37 08/31/2015 1217   ALKPHOS 59 12/23/2019 0913   ALKPHOS 70 08/31/2015 1217   BILITOT 0.5 12/23/2019 0913   BILITOT 0.64 08/31/2015 1217   GFRNONAA >60  12/23/2019 0913   GFRAA >60 12/23/2019 0913   No results found for: CHOL, HDL, LDLCALC, LDLDIRECT, TRIG, CHOLHDL Lab Results  Component Value Date   HGBA1C 5.9 (H) 05/26/2018   Lab Results  Component Value Date   [id number] 701 10/29/2017   Lab Results  Component Value Date   TSH 0.946 05/25/2018      ASSESSMENT AND PLAN 54 y.o. year old female  has a past medical history of Allergy, Anxiety, Arthritis, Balance problem, Brain aneurysm, Breast cancer ([place]), Breast cancer of lower-inner quadrant of right female breast ([place]) (08/26/2015), Chronic kidney disease, Chronic low back pain (7/79/3903), Complication of anesthesia, Constipation, Depression, Diplopia (10/29/2017), Dizziness (10/29/2017), Elevated liver function tests, Family history of adverse reaction to anesthesia, Family history of breast cancer, Family history of colon cancer, Fibromyalgia, Fibromyalgia, Gait abnormality (10/29/2017), Genetic testing (09/08/2015), GERD (gastroesophageal reflux disease), H/O acute renal failure (09/22/15), Headache, History of hiatal hernia, Hyperlipemia, Hyperlipidemia, Hypertension, Irritable bowel syndrome (IBS), Lyme disease, MGUS (monoclonal gammopathy of unknown significance) (03/15/2016), Neuromuscular disorder ([place]), Occasional tremors, OSA (obstructive sleep apnea), Personal history of radiation therapy, PONV (postoperative nausea and vomiting), Renal cell carcinoma ([place]), and Tremor, essential (10/29/2017). here with:  1.  Fibromyalgia 2.  Restless leg syndrome 3.  Intractable headache 4.  History of Lyme disease, status post antibiotic therapy 5.  Tremor   Today, we mostly focused on her headaches, Topamax has been helpful, we will increase to 100 mg twice a day.  She does not describe migraine features.  She has previously been on gabapentin, Lyrica, and Cymbalta, without benefit or tolerability.  If needed, we could possibly try nortriptyline, or baclofen for intractable headache.  These may  also be helpful for her other chronic conditions.  Topamax, may also be helpful for tremor.  Fortunately, she has excellent follow-up with her primary doctor.  She will follow-up here in 6 months or sooner if needed.  I spent 25 minutes with the patient. 50% of this time was spent discussing her plan of care.   Butler Denmark, AGNP-C, DNP 12/28/2019, 12:00 PM [facility] [address], [place], [place] [address] [phone]

## 2019-12-28 NOTE — Progress Notes (Signed)
Subjective:   Patient ID: Sarah Cooley, female   DOB: 54 y.o.   MRN: [id number]   HPI Patient presents with a lot of pain in the outside of the right foot stating it is been hurting and making it hard to walk and its been going on for several weeks.  It wakes up at night orthotics do not seem to be making a difference currently   ROS      Objective:  Physical Exam  Neurovascular status intact with patient found to have inflammation pain of the lateral side right foot base of fifth metatarsal with fluid buildup at the insertion of the tendon into the base of fifth metatarsal     Assessment:  Inflammatory changes with inflammation of the peroneal tendon base of fifth metatarsal     Plan:  Standard prep did H&P and then injected the tendon 3 mg Dexasone Kenalog 5 mg Xylocaine advised on immobilizations reappoint as needed  X-rays were negative for signs of fracture or bony pathology in this area

## 2019-12-29 ENCOUNTER — Other Ambulatory Visit (HOSPITAL_COMMUNITY): Payer: Self-pay | Admitting: *Deleted

## 2019-12-29 DIAGNOSIS — D472 Monoclonal gammopathy: Secondary | ICD-10-CM

## 2019-12-29 DIAGNOSIS — Z171 Estrogen receptor negative status [ER-]: Secondary | ICD-10-CM

## 2019-12-29 DIAGNOSIS — C50311 Malignant neoplasm of lower-inner quadrant of right female breast: Secondary | ICD-10-CM

## 2019-12-30 ENCOUNTER — Inpatient Hospital Stay (HOSPITAL_BASED_OUTPATIENT_CLINIC_OR_DEPARTMENT_OTHER): Payer: 59 | Admitting: Nurse Practitioner

## 2019-12-30 ENCOUNTER — Inpatient Hospital Stay (HOSPITAL_COMMUNITY): Payer: 59

## 2019-12-30 ENCOUNTER — Other Ambulatory Visit: Payer: Self-pay

## 2019-12-30 ENCOUNTER — Encounter (HOSPITAL_COMMUNITY): Payer: Self-pay | Admitting: Nurse Practitioner

## 2019-12-30 VITALS — BP 125/77 | HR 77 | Temp 97.3°F | Resp 16 | Wt 237.8 lb

## 2019-12-30 DIAGNOSIS — Z171 Estrogen receptor negative status [ER-]: Secondary | ICD-10-CM | POA: Diagnosis not present

## 2019-12-30 DIAGNOSIS — C50311 Malignant neoplasm of lower-inner quadrant of right female breast: Secondary | ICD-10-CM

## 2019-12-30 DIAGNOSIS — Z86000 Personal history of in-situ neoplasm of breast: Secondary | ICD-10-CM | POA: Diagnosis not present

## 2019-12-30 NOTE — Patient Instructions (Addendum)
[place] at [facility]  Discharge Instructions: Follow up in 5 months with labs and mammogram   You saw Francene Finders, NP, today. _______________________________________________________________  Thank you for choosing [place] at [facility] to provide your oncology and hematology care.  To afford each patient quality time with our providers, please arrive at least 15 minutes before your scheduled appointment.  You need to re-schedule your appointment if you arrive 10 or more minutes late.  We strive to give you quality time with our providers, and arriving late affects you and other patients whose appointments are after yours.  Also, if you no show three or more times for appointments you may be dismissed from the clinic.  Again, thank you for choosing [place] at [place] hope is that these requests will allow you access to exceptional care and in a timely manner. _______________________________________________________________  If you have questions after your visit, please contact our office at [phone] between the hours of 8:30 a.m. and 5:00 p.m. Voicemails left after 4:30 p.m. will not be returned until the following business day. _______________________________________________________________  For prescription refill requests, have your pharmacy contact our office. _______________________________________________________________  Recommendations made by the consultant and any test results will be sent to your referring physician. _______________________________________________________________

## 2019-12-30 NOTE — Assessment & Plan Note (Signed)
1.  Right breast high-grade DCIS: -Status post right lumpectomy and sentinel lymph node biopsy on 10/03/2015 by Dr. Brantley Stage.  0.4 cm high-grade DCIS, 0/1 lymph nodes positive, ER/PR negative. -She underwent radiation therapy in the adjuvant setting. -On 05/01/2017 she underwent left breast biopsy upper outer quadrant-fibroadenoma.  Left breast upper inner quadrant biopsy showed fibrocystic change. -Today's physical examination did not show any palpable masses.  Stable lumpectomy scar in the inferior margin of the areola. -Last mammogram on 03/31/2019 was BI-RADS Category 2 benign. -Genetic testing was negative. -Labs done on 12/23/2019 were WNL. -She will follow-up after her mammogram in June.  2.  Elevated free light chain ratio: -She had elevated free kappa light chains with elevated ratio. -Blood work on 05/25/2019 showed SPEP negative.  Free light chain ratio was elevated at 2.19.  Kappa light chains 36.6 -Blood work on 12/30/2019 showed SPEP negative.  Free light chain ratio was decreased at 1.37.  Kappa light chains 25.5. -Hemoglobin, creatinine and calcium were normal. -We will repeat these labs at her next visit.  3.  Left kidney clear-cell renal cell carcinoma: -Status post partial nephrectomy on 06/19/2018, RCC, clear cell type, grade 2, 1.5 cm, pT1a, negative margins. -Bone scan on 08/07/2018 was negative. -She will follow-up with her urologist.

## 2019-12-30 NOTE — Progress Notes (Signed)
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    08/23/2015 Initial Biopsy   AT LEAST HIGH DUCTAL CARCINOMA IN SITU   08/23/2015 Receptors her2   Estrogen Receptor: 0%, NEGATIVE Progesterone Receptor: 0%, NEGATIVE   08/26/2015 Initial Diagnosis   Breast cancer of lower-inner quadrant of right female breast ([place])   08/31/2015 Procedure   Gene[facility] negative.  Genes tested include: ATM, BRCA1, BRCA2, CDH1, CHEK2, PALB2, PTEN, & TP53.    10/13/2015 Pathologic Stage   pTis, pN0: Stage 0    10/13/2015 Surgery   Right lumpectomy & SLNB ([name]). High grade DCIS, 0.4 cm. Negative margins.  1 right axillary SLN neg. ER/PR repeated and both remain negative.    11/17/2015 - 12/21/2015 Radiation Therapy   Treated at [facility] [place] Right breast: Total dose 50 Gy in 25 fractions. 3-D tangents; 6 MV photons.    08/22/2018 Genetic Testing   Negative genetic testing on the CancerNExt Expanded+RNA insight panel.  The CancerNext-Expanded gene panel offered by [facility] and includes sequencing and rearrangement analysis for the following 67 genes: AIP, ALK, APC*, ATM*, BAP1, BARD1, BLM, BMPR1A, BRCA1*, BRCA2*, BRIP1*, CDH1*, CDK4, CDKN1B, CDKN2A, CHEK2*, DICER1, FANCC, FH, FLCN, GALNT12, HOXB13, MAX, MEN1, MET, MLH1*, MRE11A, MSH2*, MSH6*, MUTYH*, NBN, NF1*, NF2, PALB2*, PHOX2B, PMS2*, POLD1, POLE, POT1,  PRKAR1A, PTCH1, PTEN*, RAD50, RAD51C*, RAD51D*, RB1, RET, SDHA, SDHAF2, SDHB, SDHC, SDHD, SMAD4, SMARCA4, SMARCB1, SMARCE1, STK11, SUFU, TMEM127, TP53*, TSC1, TSC2, VHL and XRCC2 (sequencing and deletion/duplication); MITF (sequencing only); EPCAM and GREM1 (deletion/duplication only). DNA and RNA analyses performed for * genes. The report date is 08/22/2018.   Renal cancer, left ([place])  07/31/2018 Initial Diagnosis   Renal cancer, left ([place])   08/22/2018 Genetic Testing   Negative genetic testing on the CancerNExt Expanded+RNA insight panel.  The CancerNext-Expanded gene panel offered by [facility] and includes sequencing and rearrangement analysis for the following 67 genes: AIP, ALK, APC*, ATM*, BAP1, BARD1, BLM, BMPR1A, BRCA1*, BRCA2*, BRIP1*, CDH1*, CDK4, CDKN1B, CDKN2A, CHEK2*, DICER1, FANCC, FH, FLCN, GALNT12, HOXB13, MAX, MEN1, MET, MLH1*, MRE11A, MSH2*, MSH6*, MUTYH*, NBN, NF1*, NF2, PALB2*, PHOX2B, PMS2*, POLD1, POLE, POT1, PRKAR1A, PTCH1, PTEN*, RAD50, RAD51C*, RAD51D*, RB1, RET, SDHA, SDHAF2, SDHB, SDHC, SDHD, SMAD4, SMARCA4, SMARCB1, SMARCE1, STK11, SUFU, TMEM127, TP53*, TSC1, TSC2, VHL and XRCC2 (sequencing and deletion/duplication); MITF (sequencing only); EPCAM and GREM1 (deletion/duplication only). DNA and RNA analyses performed for * genes. The report date is 08/22/2018.      CANCER STAGING: Cancer Staging Breast cancer of lower-inner quadrant of right female breast [facility]) Staging form: Breast, AJCC 7th Edition - Clinical stage from 08/31/2015: Stage 0 (Tis (DCIS), N0, M0) - Unsigned - Pathologic stage from 10/13/2015: Stage 0 (Tis (DCIS), N0, cM0) - Signed by Holley Bouche, NP on 02/09/2016    INTERVAL HISTORY:  Sarah Cooley 54 y.o. female returns for routine follow-up for breast cancer.  Patient reports she has been doing well since  her last visit.  She denies any new lumps or bumps present.  She denies any new bone pain.  She does report she occasionally has dizziness and  headaches.  And has problems sleeping at night. Denies any nausea, vomiting, or diarrhea. Denies any new pains. Had not noticed any recent bleeding such as epistaxis, hematuria or hematochezia. Denies recent chest pain on exertion, shortness of breath on minimal exertion, pre-syncopal episodes, or palpitations. Denies any numbness or tingling in hands or feet. Denies any recent fevers, infections, or recent hospitalizations. Patient reports appetite at 75% and energy level at 0%.  She is eating well maintain her weight at this time.    REVIEW OF SYSTEMS:  Review of Systems  Cardiovascular: Positive for leg swelling.  Neurological: Positive for dizziness and headaches.  Psychiatric/Behavioral: Positive for sleep disturbance.  All other systems reviewed and are negative.    PAST MEDICAL/SURGICAL HISTORY:  Past Medical History:  Diagnosis Date  . Allergy    seasonal  . Anxiety   . Arthritis   . Balance problem   . Brain aneurysm    2 mm ACA region aneurysm by 09/21/15 MRA  . Breast cancer ([place])   . Breast cancer of lower-inner quadrant of right female breast ([place]) 08/26/2015  . Chronic kidney disease    acute renal failure one occasion  . Chronic low back pain 12/02/2018  . Complication of anesthesia    heart rate drops and O2 sats drop  . Constipation   . Depression   . Diplopia 10/29/2017  . Dizziness 10/29/2017  . Elevated liver function tests   . Family history of adverse reaction to anesthesia    mom has n/v  . Family history of breast cancer   . Family history of colon cancer   . Fibromyalgia   . Fibromyalgia   . Gait abnormality 10/29/2017  . Genetic testing 09/08/2015   Negative genetic testing on the Breast/High Moderate Risk panel. The Breast High/Moderate Risk gene panel offered by [facility] includes sequencing and deletion/duplication analysis of the following 9 genes: ATM, BRCA1, BRCA2, CDH1, CHEK2, PALB2, PTEN, STK11, and TP53. The report date is September 08, 2015.  The  Rest of the Comprehensive Cancer panel has been reflexed and will be available in 2-3 weeks.  POLD1 c.208G>T VUS found on the Remainder of the Comprehensive cancer panel.  The Comprehensive Cancer Panel offered by [facility] includes sequencing and/or deletion duplication testing of the following 32 genes: APC, ATM, AXIN2, BARD1, BMPR1A, BRCA1, BRCA2, BRIP1, CDH1, CDK4, CDKN2A, CHEK2, EPCAM, FANCC, MLH1, MSH2, MSH6, MUTYH, NBN, PALB2, PMS2, POLD1, POLE, PTEN, RAD51C, RAD51D, SCG5/GREM1, SMAD4, STK11, TP53, VHL, and XRCC2.   The report date is 09/12/2015.   Marland Kitchen GERD (gastroesophageal reflux disease)    none recent  . H/O acute renal failure 09/22/15   admitted to [facility]  . Headache    occipital and temporal  . History of hiatal hernia   . Hyperlipemia   . Hyperlipidemia   . Hypertension   . Irritable bowel syndrome (IBS)   . Lyme disease   . MGUS (monoclonal gammopathy of unknown significance) 03/15/2016  . Neuromuscular disorder (HCC)    fibromyalgia  . Occasional tremors   . OSA (obstructive sleep apnea)    uses cpap with humidification- auto   . Personal history of radiation therapy   . PONV (postoperative nausea and vomiting)   . Renal cell carcinoma ([place])   . Tremor, essential 10/29/2017   Past Surgical History:  Procedure  Laterality Date  . ABDOMINAL HYSTERECTOMY     adenomyosis  . ABDOMINAL HYSTERECTOMY    . BLADDER SURGERY    . BONE GRAFT HIP ILIAC CREST    . BREAST BIOPSY Bilateral   . BREAST LUMPECTOMY    . BREAST SURGERY    . CHOLECYSTECTOMY    . COLONOSCOPY WITH PROPOFOL  05/14/2016   [facility]; entire examined colon normal.  Recommendations to repeat colonoscopy in 10 years for screening purposes.  . ESOPHAGOGASTRODUODENOSCOPY (EGD) WITH PROPOFOL  03/24/2018   [facility]; normal esophagus, erythematous mucosa in the gastric body and antrum s/p biopsy, normal examined duodenum s/p biopsy.  Pathology with benign duodenal biopsy.  Stomach biopsies with chronic inactive  gastritis, negative for H. pylori.  . FRACTURE SURGERY Right    wrist  . KNEE ARTHROSCOPY    . KNEE ARTHROSCOPY W/ ACL RECONSTRUCTION     right  . RADIOACTIVE SEED GUIDED PARTIAL MASTECTOMY WITH AXILLARY SENTINEL LYMPH NODE BIOPSY Right 10/13/2015   Procedure: RADIOACTIVE SEED GUIDED PARTIAL MASTECTOMY WITH AXILLARY SENTINEL LYMPH NODE BIOPSY;  Surgeon: Erroll Luna, MD;  Location: [facility];  Service: General;  Laterality: Right;  . right carpal and cubital tunnel release     . ROBOTIC ASSITED PARTIAL NEPHRECTOMY Left 06/19/2018   Procedure: XI ROBOTIC ASSITED PARTIAL NEPHRECTOMY;  Surgeon: Ardis Hughs, MD;  Location: [facility];  Service: Urology;  Laterality: Left;  . WRIST SURGERY       SOCIAL HISTORY:  Social History   Socioeconomic History  . Marital status: Married    Spouse name: Psychologist, clinical  . Number of children: 2  . Years of education: 1  . Highest education level: Not on file  Occupational History  . Occupation: physician office  Tobacco Use  . Smoking status: Never Smoker  . Smokeless tobacco: Never Used  Substance and Sexual Activity  . Alcohol use: Not Currently    Comment: maybe one a year  . Drug use: Never  . Sexual activity: Yes    Birth control/protection: Surgical  Other Topics Concern  . Not on file  Social History Narrative   ** Merged History Encounter **       Lives with husband Husband on disability Children grown and moved worries about family illness- mother and sister Caffeine use:     Social Determinants of Health   Financial Resource Strain:   . Difficulty of Paying Living Expenses: Not on file  Food Insecurity:   . Worried About Charity fundraiser in the Last Year: Not on file  . Ran Out of Food in the Last Year: Not on file  Transportation Needs:   . Lack of Transportation (Medical): Not on file  . Lack of Transportation (Non-Medical): Not on file  Physical Activity:   . Days of Exercise per Week: Not on file  . Minutes of  Exercise per Session: Not on file  Stress:   . Feeling of Stress : Not on file  Social Connections:   . Frequency of Communication with Friends and Family: Not on file  . Frequency of Social Gatherings with Friends and Family: Not on file  . Attends Religious Services: Not on file  . Active Member of Clubs or Organizations: Not on file  . Attends Archivist Meetings: Not on file  . Marital Status: Not on file  Intimate Partner Violence:   . Fear of Current or Ex-Partner: Not on file  . Emotionally Abused: Not on file  . Physically  Abused: Not on file  . Sexually Abused: Not on file    FAMILY HISTORY:  Family History  Problem Relation Age of Onset  . Hypertension Mother   . Autoimmune disease Mother        lichen planus  . Eczema Mother   . Parkinson's disease Mother   . Mental illness Mother        bipolar  . Heart disease Maternal Aunt   . Lung cancer Paternal Aunt   . Breast cancer Paternal Aunt        dx in his 81s  . Parkinsonism Paternal Aunt   . Lung cancer Paternal Grandfather   . Colon cancer Paternal Uncle        dx in his 22s  . Colon cancer Paternal Uncle        dx in his 40s  . Melanoma Father 68  . Psoriasis Father   . Hypertension Father   . Heart disease Father   . Breast cancer Sister        dx in her 64s  . Lupus Sister   . COPD Maternal Uncle   . Skin cancer Maternal Uncle   . Colon cancer Paternal Uncle        dx in his 34s  . Mental illness Son        bipolar  . Polycystic ovary syndrome Other     CURRENT MEDICATIONS:  Outpatient Encounter Medications as of 12/30/2019  Medication Sig  . calcium carbonate (TUMS - DOSED IN MG ELEMENTAL CALCIUM) 500 MG chewable tablet Chew 1 tablet (200 mg of elemental calcium total) by mouth 2 (two) times daily with breakfast and lunch.  Marland Kitchen DILT-XR 120 MG 24 hr capsule Take 120 mg by mouth daily.   Marland Kitchen FLUoxetine (PROZAC) 20 MG capsule Take 60 mg by mouth daily.  . furosemide (LASIX) 20 MG tablet Take  20 mg by mouth daily. PRN  . HYDROcodone-acetaminophen (NORCO) 7.5-325 MG tablet Take 1 tablet by mouth 3 (three) times daily as needed. for pain  . losartan (COZAAR) 25 MG tablet Take 25 mg by mouth daily.  Marland Kitchen OVER THE COUNTER MEDICATION Take 1 capsule by mouth daily as needed. Diphenhydramate Over The Counter Anti-emetic  . pantoprazole (PROTONIX) 40 MG tablet Take 1 tablet (40 mg total) by mouth daily before breakfast.  . pramipexole (MIRAPEX) 0.125 MG tablet TAKE ONE TABLET BY MOUTH DAILY AT DINNERTIME AND TAKE TWO TABLETS BY MOUTH AT BEDTIME  . pravastatin (PRAVACHOL) 10 MG tablet Take 10 mg by mouth daily.  Marland Kitchen topiramate (TOPAMAX) 50 MG tablet Take 2 twice daily (Patient taking differently: Take 100 mg by mouth 2 (two) times daily. Take 2 twice daily)  . Vitamin D, Ergocalciferol, (DRISDOL) 50000 units CAPS capsule Take 50,000 Units by mouth every 7 (seven) days. Take 1 tablet (50,000 units) by mouth every Thursday   No facility-administered encounter medications on file as of 12/30/2019.    ALLERGIES:  Allergies  Allergen Reactions  . Ramipril Nausea And Vomiting and Other (See Comments)    Ramipril--Caused Pancreatitis  . Shrimp [Shellfish Allergy] Anaphylaxis and Swelling  . Minocycline Hcl Hives  . Other Rash    ADHESIVE GLUE USED FOR INCISIONS  Anti-inflammatory medicine - pt stated, "Upsets my GI tract" Please use paper tape Please use paper tape Please use paper tape Patient is allergic to many adhesives   . Altace [Ramipril] Nausea And Vomiting and Nausea Only    Drug induced pancreatitis   . Lyrica [  Pregabalin] Nausea And Vomiting  . Adhesive [Tape] Rash    Please use paper tape Patient is allergic to many adhesives   . Bactrim [Sulfamethoxazole-Trimethoprim] Other (See Comments)    May have caused kidney issues  . Drixoral [Brompheniramine-Pseudoeph] Rash  . Erythromycin Other (See Comments)    GI- Upset / severe abdominal pain  . Minocin [Minocycline Hcl] Nausea  And Vomiting and Rash  . Septra [Sulfamethoxazole-Trimethoprim] Rash  . Sinutab [Chlorphen-Pseudoephed-Apap] Rash     PHYSICAL EXAM:  ECOG Performance status: 1  Vitals:   12/30/19 0819  BP: 125/77  Pulse: 77  Resp: 16  Temp: (!) 97.3 F (36.3 C)  SpO2: 100%   Filed Weights   12/30/19 0819  Weight: 237 lb 12.8 oz (107.9 kg)    Physical Exam Constitutional:      Appearance: She is obese.  Cardiovascular:     Rate and Rhythm: Normal rate and regular rhythm.     Heart sounds: Normal heart sounds.  Pulmonary:     Effort: Pulmonary effort is normal.     Breath sounds: Normal breath sounds.  Abdominal:     General: Bowel sounds are normal.     Palpations: Abdomen is soft.  Musculoskeletal:        General: Normal range of motion.  Skin:    General: Skin is warm.  Neurological:     Mental Status: She is alert. Mental status is at baseline.  Psychiatric:        Mood and Affect: Mood normal.        Behavior: Behavior normal.        Thought Content: Thought content normal.        Judgment: Judgment normal.   Breast: No palpable masses, no skin changes or nipple discharge, no adenopathy. Lumpectomy scar on right breast within normal limits  LABORATORY DATA:  I have reviewed the labs as listed.  CBC    Component Value Date/Time   WBC 8.1 12/23/2019 0913   RBC 4.42 12/23/2019 0913   HGB 13.8 12/23/2019 0913   HGB 15.5 08/31/2015 1217   HCT 42.7 12/23/2019 0913   HCT 45.4 08/31/2015 1217   PLT 209 12/23/2019 0913   PLT 253 08/31/2015 1217   MCV 96.6 12/23/2019 0913   MCV 95.0 08/31/2015 1217   MCH 31.2 12/23/2019 0913   MCHC 32.3 12/23/2019 0913   RDW 12.7 12/23/2019 0913   RDW 13.5 08/31/2015 1217   LYMPHSABS 1.8 12/23/2019 0913   LYMPHSABS 2.3 08/31/2015 1217   MONOABS 0.6 12/23/2019 0913   MONOABS 0.5 08/31/2015 1217   EOSABS 0.2 12/23/2019 0913   EOSABS 0.1 08/31/2015 1217   BASOSABS 0.0 12/23/2019 0913   BASOSABS 0.0 08/31/2015 1217   CMP Latest  Ref Rng & Units 12/23/2019 05/25/2019 02/13/2019  Glucose 70 - 99 mg/dL 132(H) 121(H) -  BUN 6 - 20 mg/dL 21(H) 21(H) -  Creatinine 0.44 - 1.00 mg/dL 0.78 0.85 0.80  Sodium 135 - 145 mmol/L 139 140 -  Potassium 3.5 - 5.1 mmol/L 3.4(L) 3.7 -  Chloride 98 - 111 mmol/L 108 108 -  CO2 22 - 32 mmol/L 22 23 -  Calcium 8.9 - 10.3 mg/dL 8.8(L) 9.0 -  Total Protein 6.5 - 8.1 g/dL 7.5 6.8 -  Total Bilirubin 0.3 - 1.2 mg/dL 0.5 0.4 -  Alkaline Phos 38 - 126 U/L 59 60 -  AST 15 - 41 U/L 18 23 -  ALT 0 - 44 U/L 25 32 -  I personally performed a face-to-face visit.  All questions were answered to patient's stated satisfaction. Encouraged patient to call with any new concerns or questions before his next visit to the cancer center and we can certain see him sooner, if needed.     ASSESSMENT & PLAN:   Breast cancer of lower-inner quadrant of right female breast ([place]) 1.  Right breast high-grade DCIS: -Status post right lumpectomy and sentinel lymph node biopsy on 10/03/2015 by Dr. Brantley Stage.  0.4 cm high-grade DCIS, 0/1 lymph nodes positive, ER/PR negative. -She underwent radiation therapy in the adjuvant setting. -On 05/01/2017 she underwent left breast biopsy upper outer quadrant-fibroadenoma.  Left breast upper inner quadrant biopsy showed fibrocystic change. -Today's physical examination did not show any palpable masses.  Stable lumpectomy scar in the inferior margin of the areola. -Last mammogram on 03/31/2019 was BI-RADS Category 2 benign. -Genetic testing was negative. -Labs done on 12/23/2019 were WNL. -She will follow-up after her mammogram in June.  2.  Elevated free light chain ratio: -She had elevated free kappa light chains with elevated ratio. -Blood work on 05/25/2019 showed SPEP negative.  Free light chain ratio was elevated at 2.19.  Kappa light chains 36.6 -Blood work on 12/30/2019 showed SPEP negative.  Free light chain ratio was decreased at 1.37.  Kappa light chains 25.5. -Hemoglobin,  creatinine and calcium were normal. -We will repeat these labs at her next visit.  3.  Left kidney clear-cell renal cell carcinoma: -Status post partial nephrectomy on 06/19/2018, RCC, clear cell type, grade 2, 1.5 cm, pT1a, negative margins. -Bone scan on 08/07/2018 was negative. -She will follow-up with her urologist.      Orders placed this encounter:  Orders Placed This Encounter  Procedures  . MM DIAG BREAST TOMO BILATERAL  . Lactate dehydrogenase  . Protein electrophoresis, serum  . Kappa/lambda light chains  . IgG, IgA, IgM  . Vitamin B12  . VITAMIN D 25 Hydroxy (Vit-D Deficiency, Fractures)  . Folate  . CBC with Differential/Platelet  . Comprehensive metabolic panel      Francene Finders, FNP-C [place] [phone]

## 2019-12-31 NOTE — Progress Notes (Signed)
I have read the note, and I agree with the clinical assessment and plan.  Dyonna Jaspers K Tyren Dugar   

## 2020-01-01 LAB — IMMUNOFIXATION ELECTROPHORESIS
IgA: 439 mg/dL — ABNORMAL HIGH (ref 87–352)
IgG (Immunoglobin G), Serum: 839 mg/dL (ref 586–1602)
IgM (Immunoglobulin M), Srm: 76 mg/dL (ref 26–217)
Total Protein ELP: 7.5 g/dL (ref 6.0–8.5)

## 2020-01-15 ENCOUNTER — Other Ambulatory Visit: Payer: Self-pay | Admitting: Neurology

## 2020-01-18 ENCOUNTER — Ambulatory Visit (INDEPENDENT_AMBULATORY_CARE_PROVIDER_SITE_OTHER): Payer: 59 | Admitting: Podiatry

## 2020-01-18 ENCOUNTER — Other Ambulatory Visit: Payer: Self-pay

## 2020-01-18 ENCOUNTER — Telehealth: Payer: Self-pay | Admitting: *Deleted

## 2020-01-18 ENCOUNTER — Encounter: Payer: Self-pay | Admitting: Podiatry

## 2020-01-18 VITALS — Temp 97.9°F

## 2020-01-18 DIAGNOSIS — M7671 Peroneal tendinitis, right leg: Secondary | ICD-10-CM | POA: Diagnosis not present

## 2020-01-18 DIAGNOSIS — T148XXA Other injury of unspecified body region, initial encounter: Secondary | ICD-10-CM

## 2020-01-18 DIAGNOSIS — M79671 Pain in right foot: Secondary | ICD-10-CM

## 2020-01-18 DIAGNOSIS — M25571 Pain in right ankle and joints of right foot: Secondary | ICD-10-CM

## 2020-01-18 NOTE — Telephone Encounter (Signed)
Orders to K. Mavrakis, CMA for pre-cert, faxed to [place].

## 2020-01-20 NOTE — Progress Notes (Signed)
Subjective:   Patient ID: Sarah Cooley, female   DOB: 54 y.o.   MRN: [id number]   HPI Patient presents stating she still getting a lot of pain in the outside of her right foot and so far has not responded to numerous conservative treatments including immobilization anti-inflammatory physical therapy   ROS      Objective:  Physical Exam  Neurovascular status intact with exquisite discomfort of the peroneal tendon at its insertion into the base of the fifth metatarsal and slightly proximal with possible dysfunction of the tendon and fusiform swelling     Assessment:  Possibility for peroneal tendon right with swelling inflammation and pain at its insertion and failure to respond conservatively     Plan:  Reviewed condition recommended MRI due to the intensity of discomfort failure to respond to immobilization physical therapy anti-inflammatories and patient will be scheduled for MRI with see results and decide whether or not surgical intervention will be necessary in this case

## 2020-01-20 NOTE — Telephone Encounter (Signed)
Valery:  Faxed over paperwork to patient's insurance company [facility]) for a Prior Authorization.  Waiting for a response.  Thank you, Rolly Pancake, CMA (AAMA)

## 2020-01-26 ENCOUNTER — Other Ambulatory Visit: Payer: Self-pay

## 2020-01-26 ENCOUNTER — Encounter (HOSPITAL_COMMUNITY): Payer: Self-pay | Admitting: Emergency Medicine

## 2020-01-26 ENCOUNTER — Emergency Department (HOSPITAL_COMMUNITY)
Admission: EM | Admit: 2020-01-26 | Discharge: 2020-01-27 | Disposition: A | Discharge status: Home / Self Care | Payer: 59 | Attending: Emergency Medicine | Admitting: Emergency Medicine

## 2020-01-26 ENCOUNTER — Emergency Department (HOSPITAL_COMMUNITY): Payer: 59

## 2020-01-26 DIAGNOSIS — Y999 Unspecified external cause status: Secondary | ICD-10-CM | POA: Insufficient documentation

## 2020-01-26 DIAGNOSIS — R519 Headache, unspecified: Secondary | ICD-10-CM | POA: Insufficient documentation

## 2020-01-26 DIAGNOSIS — Y92410 Unspecified street and highway as the place of occurrence of the external cause: Secondary | ICD-10-CM | POA: Insufficient documentation

## 2020-01-26 DIAGNOSIS — I10 Essential (primary) hypertension: Secondary | ICD-10-CM | POA: Diagnosis not present

## 2020-01-26 DIAGNOSIS — M549 Dorsalgia, unspecified: Secondary | ICD-10-CM | POA: Diagnosis not present

## 2020-01-26 DIAGNOSIS — Z79899 Other long term (current) drug therapy: Secondary | ICD-10-CM | POA: Diagnosis not present

## 2020-01-26 DIAGNOSIS — M542 Cervicalgia: Secondary | ICD-10-CM | POA: Diagnosis not present

## 2020-01-26 DIAGNOSIS — G44319 Acute post-traumatic headache, not intractable: Secondary | ICD-10-CM

## 2020-01-26 DIAGNOSIS — Y93I9 Activity, other involving external motion: Secondary | ICD-10-CM | POA: Insufficient documentation

## 2020-01-26 MED ORDER — METHOCARBAMOL 500 MG PO TABS: 500.0000 mg | ORAL_TABLET | Freq: Three times a day (TID) | ORAL | 0 refills | Status: DC | PRN
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
## 2020-01-26 NOTE — Discharge Instructions (Signed)
You are being prescribed a medication which may make you sleepy. For 24 hours after one dose please do not drive, operate heavy machinery, care for a small child with out another adult present, or perform any activities that may cause harm to you or someone else if you were to fall asleep or be impaired.

## 2020-01-27 ENCOUNTER — Ambulatory Visit: Payer: Medicaid Other | Admitting: Gastroenterology

## 2020-01-29 ENCOUNTER — Telehealth: Payer: Self-pay | Admitting: Family Medicine
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
## 2020-02-02 ENCOUNTER — Encounter: Payer: Self-pay | Admitting: Family Medicine

## 2020-02-02 ENCOUNTER — Telehealth: Payer: Self-pay | Admitting: *Deleted

## 2020-02-02 ENCOUNTER — Telehealth (INDEPENDENT_AMBULATORY_CARE_PROVIDER_SITE_OTHER): Payer: 59 | Admitting: Family Medicine

## 2020-02-02 DIAGNOSIS — S0990XA Unspecified injury of head, initial encounter: Secondary | ICD-10-CM

## 2020-02-02 NOTE — Patient Instructions (Addendum)
Good to talk to you today.  Fish oil 2 grams daily  Vitamin D 50000 iu weekly  CoQ10 200mg  daily.

## 2020-02-02 NOTE — Telephone Encounter (Signed)
[place] for patient to see if their MRI of the Right Foot WO Contrast was approved.  This was approved, but was on an automated system that did not give me a reference number for this.

## 2020-02-12 ENCOUNTER — Ambulatory Visit
Admission: RE | Admit: 2020-02-12 | Discharge: 2020-02-12 | Disposition: A | Discharge status: Home / Self Care | Payer: 59 | Source: Ambulatory Visit | Attending: Podiatry | Admitting: Podiatry

## 2020-02-12 ENCOUNTER — Other Ambulatory Visit: Payer: Self-pay

## 2020-02-12 DIAGNOSIS — M25571 Pain in right ankle and joints of right foot: Secondary | ICD-10-CM

## 2020-02-12 DIAGNOSIS — M7671 Peroneal tendinitis, right leg: Secondary | ICD-10-CM

## 2020-02-12 DIAGNOSIS — M79671 Pain in right foot: Secondary | ICD-10-CM

## 2020-02-12 DIAGNOSIS — T148XXA Other injury of unspecified body region, initial encounter: Secondary | ICD-10-CM

## 2020-02-15 ENCOUNTER — Telehealth: Payer: Self-pay | Admitting: *Deleted

## 2020-02-15 ENCOUNTER — Ambulatory Visit: Payer: 59 | Admitting: Family Medicine

## 2020-02-15 ENCOUNTER — Other Ambulatory Visit (HOSPITAL_COMMUNITY): Payer: Self-pay | Admitting: Internal Medicine

## 2020-02-15 ENCOUNTER — Other Ambulatory Visit: Payer: Self-pay | Admitting: Internal Medicine

## 2020-02-15 DIAGNOSIS — R310 Gross hematuria: Secondary | ICD-10-CM

## 2020-02-15 DIAGNOSIS — R32 Unspecified urinary incontinence: Secondary | ICD-10-CM

## 2020-02-15 NOTE — Telephone Encounter (Signed)
Pt states she had MRI on Friday at 7:30am and would like to know the results since this has been going on for 8 weeks.

## 2020-02-15 NOTE — Telephone Encounter (Signed)
Dr. Paulla Dolly states pt has a stress fracture and tendonitis, make sure pt has a cam boot and get in office 1-2 weeks. I informed pt of Dr. Mellody Drown review of results and orders. Pt states the cam walker causes discomfort just like her athletic shoes. I told pt the cam walker was to be used in conjunction with resting, so she would need to cut her activities to just ADL until seen in office.

## 2020-02-19 ENCOUNTER — Ambulatory Visit (INDEPENDENT_AMBULATORY_CARE_PROVIDER_SITE_OTHER): Payer: 59 | Admitting: Podiatry

## 2020-02-19 ENCOUNTER — Other Ambulatory Visit: Payer: Self-pay

## 2020-02-19 ENCOUNTER — Encounter: Payer: Self-pay | Admitting: Podiatry

## 2020-02-19 DIAGNOSIS — S92301A Fracture of unspecified metatarsal bone(s), right foot, initial encounter for closed fracture: Secondary | ICD-10-CM

## 2020-02-19 NOTE — Progress Notes (Signed)
Subjective:   Patient ID: Sarah Cooley, female   DOB: 54 y.o.   MRN: [id number]   HPI Patient presents stating the side of my foot is really hurting and I understand I have a fracture and I cannot wear that big boot I need to do boot as it is years old and no longer hold my foot proper   ROS      Objective:  Physical Exam  Neurovascular status intact with discomfort in the fifth metatarsal cuboid bone very painful lateral side right F2     Assessment:  Probable fracture of the cuboid fifth metatarsal right     Plan:  H&P conditions reviewed and explained and at this point short air fracture walker dispensed and if we cannot get relief of this over the next few months are going to have to consider bone stimulator.  Patient will be seen back to recheck and is encouraged to call with questions prior to see back in 8 weeks with review of MRI done today

## 2020-02-23 ENCOUNTER — Other Ambulatory Visit (HOSPITAL_COMMUNITY): Payer: Self-pay | Admitting: Internal Medicine

## 2020-02-23 DIAGNOSIS — M8430XA Stress fracture, unspecified site, initial encounter for fracture: Secondary | ICD-10-CM

## 2020-02-25 ENCOUNTER — Other Ambulatory Visit: Payer: Self-pay | Admitting: Urology

## 2020-02-25 DIAGNOSIS — C642 Malignant neoplasm of left kidney, except renal pelvis: Secondary | ICD-10-CM

## 2020-02-26 ENCOUNTER — Ambulatory Visit: Payer: 59 | Admitting: Podiatry

## 2020-03-02 ENCOUNTER — Other Ambulatory Visit: Payer: Self-pay

## 2020-03-02 ENCOUNTER — Ambulatory Visit (HOSPITAL_COMMUNITY)
Admission: RE | Admit: 2020-03-02 | Discharge: 2020-03-02 | Disposition: A | Discharge status: Home / Self Care | Payer: 59 | Source: Ambulatory Visit | Attending: Internal Medicine | Admitting: Internal Medicine

## 2020-03-02 DIAGNOSIS — R310 Gross hematuria: Secondary | ICD-10-CM | POA: Diagnosis not present

## 2020-03-02 DIAGNOSIS — R32 Unspecified urinary incontinence: Secondary | ICD-10-CM | POA: Diagnosis present

## 2020-03-02 LAB — POCT I-STAT CREATININE: Creatinine, Ser: 0.8 mg/dL (ref 0.44–1.00)

## 2020-03-02 MED ORDER — IOHEXOL 300 MG/ML  SOLN
100.0000 mL | Freq: Once | INTRAMUSCULAR | Status: AC | PRN
  Administered 2020-03-02: 100 mL via INTRAVENOUS
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
## 2020-03-03 ENCOUNTER — Ambulatory Visit (INDEPENDENT_AMBULATORY_CARE_PROVIDER_SITE_OTHER): Payer: 59 | Admitting: Neurology

## 2020-03-03 ENCOUNTER — Encounter: Payer: Self-pay | Admitting: Neurology

## 2020-03-03 VITALS — BP 139/93 | HR 58 | Temp 97.1°F | Ht 68.0 in | Wt 236.0 lb

## 2020-03-03 DIAGNOSIS — I671 Cerebral aneurysm, nonruptured: Secondary | ICD-10-CM | POA: Insufficient documentation

## 2020-03-03 DIAGNOSIS — R519 Headache, unspecified: Secondary | ICD-10-CM | POA: Diagnosis not present

## 2020-03-03 NOTE — Patient Instructions (Signed)
It was nice to see you today  I will order MRA of the head to re-evaluate your aneurysm  Please follow back up with Dr. Tamala Julian for your concussion  See you back in 3 months

## 2020-03-03 NOTE — Progress Notes (Signed)
I have read the note, and I agree with the clinical assessment and plan.  Carliyah Cotterman K Jordi Kamm   

## 2020-03-09 ENCOUNTER — Other Ambulatory Visit (HOSPITAL_COMMUNITY): Payer: Self-pay | Admitting: Internal Medicine

## 2020-03-09 ENCOUNTER — Telehealth: Payer: Self-pay | Admitting: Neurology

## 2020-03-09 DIAGNOSIS — R059 Cough, unspecified: Secondary | ICD-10-CM

## 2020-03-09 NOTE — Telephone Encounter (Signed)
no to the covid questions MRA Head wo contrast Micco: [id number] (exp. 03/09/20 to 06/07/20) patient is scheduled at [facility] for 03/29/20.

## 2020-03-13 ENCOUNTER — Other Ambulatory Visit: Payer: 59

## 2020-03-20 ENCOUNTER — Other Ambulatory Visit: Payer: Self-pay | Admitting: Gastroenterology

## 2020-03-20 DIAGNOSIS — K219 Gastro-esophageal reflux disease without esophagitis: Secondary | ICD-10-CM

## 2020-03-20 DIAGNOSIS — R110 Nausea: Secondary | ICD-10-CM

## 2020-03-23 ENCOUNTER — Other Ambulatory Visit: Payer: 59

## 2020-03-23 ENCOUNTER — Other Ambulatory Visit (HOSPITAL_COMMUNITY): Payer: Self-pay | Admitting: Nurse Practitioner

## 2020-03-23 DIAGNOSIS — Z9889 Other specified postprocedural states: Secondary | ICD-10-CM

## 2020-03-24 ENCOUNTER — Other Ambulatory Visit: Payer: Self-pay

## 2020-03-24 ENCOUNTER — Ambulatory Visit (HOSPITAL_COMMUNITY)
Admission: RE | Admit: 2020-03-24 | Discharge: 2020-03-24 | Disposition: A | Discharge status: Home / Self Care | Payer: 59 | Source: Ambulatory Visit | Attending: Internal Medicine | Admitting: Internal Medicine

## 2020-03-24 DIAGNOSIS — R05 Cough: Secondary | ICD-10-CM | POA: Diagnosis present

## 2020-03-24 DIAGNOSIS — R059 Cough, unspecified: Secondary | ICD-10-CM

## 2020-03-26 NOTE — Progress Notes (Signed)
Referring Provider: Celene Squibb, MD Primary Care Physician:  Celene Squibb, MD Primary GI Physician: Dr. Oneida Alar (Dr. Gala Romney following for now)  Chief Complaint  Patient presents with   c diff    f/u. had diarrhea few weeks ago from stomach infection. Reports recently had aug x 10 days for pna and gave diarrhea    Gastroesophageal Reflux    f/u.     HPI:   Sarah Cooley is a 54 y.o. female with GI history of  IBS with alternating constipation and diarrhea, C. Diff in 2019 treated with oral vancomycin with resolution of diarrhea, hiatal hernia, and GERD.  Additional past medical history as per below.  Presenting today for follow-up of Nausea without vomiting, GERD, and constipation.   Last seen in our office 09/16/2019. Reported she had been doing well until about 3 months ago when she had 1 day of watery diarrhea, abdominal pain and nausea. Abdominal pain resolved after 1-2 weeks. Continues with nausea, worse in the am, associated 8 lb weight loss with decreased appetite. Worsening indigestion/reflux using a lot of tums. Also noted epigastric burning intermittently. No NSAIDs. Reported history of H. Pylori in her 68s treated with PrevPac. Usually had BMs daily but had not had a BM in 3 days. Stool consistency varied from soft/formed (typically) with occasional hard or loose stool. No brbpr or melena. Reported prior TCS/EGD with [facility]. She was started on Protonix 40 mg daily, counseled on GERD diet, advised to add MiraLAX 17g daily as needed for constipation. We requested prior records from [facility].   Received records: Colonoscopy 05/14/2016 with normal exam.  Recommended repeat colonoscopy in 2027. EGD 03/24/2018 for epigastric pain, GERD, bloating.  Impression with normal esophagus, erythematous mucosa in the gastric body s/p biopsied, erythematous mucosa in the antrum biopsied, normal examined duodenum biopsied.  Duodenal biopsies benign, negative for celiac disease.  Gastric biopsies  with chronic active gastritis, negative for H. Pylori.  Patient canceled follow-up in February and April.  Today: Had a "stomach bug" 2-3 weeks ago with diarrhea x 3 days. Husband also had same symptoms. This resolved. Was treated with Augmentin x 10 days in May which caused diarrhea and yeast infection. Diarrhea again resolved. 2 days ago, she had watery diarrhea out of the blue. None since. Had not eaten out. Nothing spicy or fried prior. Within the last year, she has been treated for UTI twice and pneumonia once and is concerned about C. difficile. BMs now daily, soft, and formed. No blood in the stool or black stool.   N/V has resolved. No abdominal pain. GERD is much better with Protonix. No breakthrough symptoms. No dysphagia. Reports food/liquids going down the wrong way at times. 1-2 times a week. Feels it is progressing over time. Didn't know she had pneumonia in May. Had no symptoms.    Past Medical History:  Diagnosis Date   Allergy    seasonal   Anxiety    Arthritis    Balance problem    Brain aneurysm    2 mm ACA region aneurysm by 09/21/15 MRA   Breast cancer [facility])    Breast cancer of lower-inner quadrant of right female breast ([place]) 08/26/2015   Chronic kidney disease    acute renal failure one occasion   Chronic low back pain 8/56/3149   Complication of anesthesia    heart rate drops and O2 sats drop   Constipation    Depression    Diplopia 10/29/2017  Dizziness 10/29/2017   Elevated liver function tests    Family history of adverse reaction to anesthesia    mom has n/v   Family history of breast cancer    Family history of colon cancer    Fibromyalgia    Fibromyalgia    Gait abnormality 10/29/2017   Genetic testing 09/08/2015   Negative genetic testing on the Breast/High Moderate Risk panel. The Breast High/Moderate Risk gene panel offered by [facility] includes sequencing and deletion/duplication analysis of the following 9 genes: ATM, BRCA1,  BRCA2, CDH1, CHEK2, PALB2, PTEN, STK11, and TP53. The report date is September 08, 2015.  The Rest of the Comprehensive Cancer panel has been reflexed and will be available in 2-3 weeks.  POLD1 c.208G>T VUS found on the Remainder of the Comprehensive cancer panel.  The Comprehensive Cancer Panel offered by [facility] includes sequencing and/or deletion duplication testing of the following 32 genes: APC, ATM, AXIN2, BARD1, BMPR1A, BRCA1, BRCA2, BRIP1, CDH1, CDK4, CDKN2A, CHEK2, EPCAM, FANCC, MLH1, MSH2, MSH6, MUTYH, NBN, PALB2, PMS2, POLD1, POLE, PTEN, RAD51C, RAD51D, SCG5/GREM1, SMAD4, STK11, TP53, VHL, and XRCC2.   The report date is 09/12/2015.    GERD (gastroesophageal reflux disease)    none recent   H/O acute renal failure 09/22/15   admitted to [facility]   Headache    occipital and temporal   History of hiatal hernia    Hyperlipemia    Hyperlipidemia    Hypertension    Irritable bowel syndrome (IBS)    Lyme disease    MGUS (monoclonal gammopathy of unknown significance) 03/15/2016   Neuromuscular disorder (HCC)    fibromyalgia   Occasional tremors    OSA (obstructive sleep apnea)    uses cpap with humidification- auto    Personal history of radiation therapy    PONV (postoperative nausea and vomiting)    Renal cell carcinoma (HCC)    Tremor, essential 10/29/2017    Past Surgical History:  Procedure Laterality Date   ABDOMINAL HYSTERECTOMY     adenomyosis   ABDOMINAL HYSTERECTOMY     BLADDER SURGERY     BONE GRAFT HIP ILIAC CREST     BREAST BIOPSY Bilateral    BREAST LUMPECTOMY     BREAST SURGERY     CHOLECYSTECTOMY     COLONOSCOPY WITH PROPOFOL  05/14/2016   [facility]; entire examined colon normal.  Recommendations to repeat colonoscopy in 10 years for screening purposes.   ESOPHAGOGASTRODUODENOSCOPY (EGD) WITH PROPOFOL  03/24/2018   [facility]; normal esophagus, erythematous mucosa in the gastric body and antrum s/p biopsy, normal examined duodenum  s/p biopsy.  Pathology with benign duodenal biopsy.  Stomach biopsies with chronic inactive gastritis, negative for H. pylori.   FRACTURE SURGERY Right    wrist   KNEE ARTHROSCOPY     KNEE ARTHROSCOPY W/ ACL RECONSTRUCTION     right   RADIOACTIVE SEED GUIDED PARTIAL MASTECTOMY WITH AXILLARY SENTINEL LYMPH NODE BIOPSY Right 10/13/2015   Procedure: RADIOACTIVE SEED GUIDED PARTIAL MASTECTOMY WITH AXILLARY SENTINEL LYMPH NODE BIOPSY;  Surgeon: Erroll Luna, MD;  Location: [facility];  Service: General;  Laterality: Right;   right carpal and cubital tunnel release      ROBOTIC ASSITED PARTIAL NEPHRECTOMY Left 06/19/2018   Procedure: XI ROBOTIC ASSITED PARTIAL NEPHRECTOMY;  Surgeon: Ardis Hughs, MD;  Location: [facility];  Service: Urology;  Laterality: Left;   WRIST SURGERY      Current Outpatient Medications  Medication Sig Dispense Refill   calcium carbonate (TUMS -  DOSED IN MG ELEMENTAL CALCIUM) 500 MG chewable tablet Chew 1 tablet (200 mg of elemental calcium total) by mouth 2 (two) times daily with breakfast and lunch.     clonazePAM (KLONOPIN) 0.5 MG tablet Take 0.5 mg by mouth daily as needed.     cyclobenzaprine (FLEXERIL) 10 MG tablet Take 10 mg by mouth 3 (three) times daily.     DILT-XR 120 MG 24 hr capsule Take 120 mg by mouth daily.   0   FLUoxetine (PROZAC) 20 MG capsule Take 60 mg by mouth daily.  12   furosemide (LASIX) 20 MG tablet Take 20 mg by mouth daily. PRN     HYDROcodone-acetaminophen (NORCO) 7.5-325 MG tablet Take 1 tablet by mouth 3 (three) times daily as needed. for pain     losartan (COZAAR) 25 MG tablet Take 25 mg by mouth daily.  0   meloxicam (MOBIC) 7.5 MG tablet Take 7.5 mg by mouth 2 (two) times daily.     OVER THE COUNTER MEDICATION Take 1 capsule by mouth daily as needed. Diphenhydramate Over The Counter Anti-emetic     pantoprazole (PROTONIX) 40 MG tablet TAKE ONE TABLET BY MOUTH EVERY MORNING PRIOR TO BREAKFAST 30 tablet 5   pramipexole  (MIRAPEX) 0.125 MG tablet TAKE 1 TABLET BY MOUTH DAILY AT DINNERTIME AND TAKE 2 TABLETS BY MOUTH AT BEDTIME 90 tablet 5   pravastatin (PRAVACHOL) 10 MG tablet Take 10 mg by mouth daily.     topiramate (TOPAMAX) 50 MG tablet Take 2 twice daily (Patient taking differently: Take 100 mg by mouth 2 (two) times daily. Take 2 twice daily) 360 tablet 1   Vitamin D, Ergocalciferol, (DRISDOL) 50000 units CAPS capsule Take 50,000 Units by mouth every 7 (seven) days. Take 1 tablet (50,000 units) by mouth every Thursday     No current facility-administered medications for this visit.    Allergies as of 03/28/2020 - Review Complete 03/28/2020  Allergen Reaction Noted   Ramipril Nausea And Vomiting and Other (See Comments) 02/12/2012   Minocycline hcl Hives 02/12/2012   Other Rash 10/11/2015   Altace [ramipril] Nausea And Vomiting and Nausea Only 01/18/2018   Lyrica [pregabalin] Nausea And Vomiting 01/18/2018   Adhesive [tape] Rash 10/11/2015   Bactrim [sulfamethoxazole-trimethoprim] Other (See Comments) 10/11/2015   Drixoral [brompheniramine-pseudoeph] Rash 02/12/2012   Erythromycin Other (See Comments) 02/12/2012   Minocin [minocycline hcl] Nausea And Vomiting and Rash 01/18/2018   Septra [sulfamethoxazole-trimethoprim] Rash 01/18/2018   Sinutab [chlorphen-pseudoephed-apap] Rash 02/12/2012    Family History  Problem Relation Age of Onset   Hypertension Mother    Autoimmune disease Mother        lichen planus   Eczema Mother    Parkinson's disease Mother    Mental illness Mother        bipolar   Heart disease Maternal Aunt    Lung cancer Paternal Aunt    Breast cancer Paternal Aunt        dx in his 48s   Parkinsonism Paternal 74    Lung cancer Paternal Grandfather    Colon cancer Paternal Uncle        dx in his 85s   Colon cancer Paternal Uncle        dx in his 32s   Melanoma Father 42   Psoriasis Father    Hypertension Father    Heart disease Father     Breast cancer Sister        dx in her 44s   Lupus Sister  COPD Maternal Uncle    Skin cancer Maternal Uncle    Colon cancer Paternal Uncle        dx in his 64s   Mental illness Son        bipolar   Polycystic ovary syndrome Other     Social History   Socioeconomic History   Marital status: Married    Spouse name: [name]   Number of children: 2   Years of education: 12   Highest education level: Not on file  Occupational History   Occupation: physician office  Tobacco Use   Smoking status: Never Smoker   Smokeless tobacco: Never Used  Substance and Sexual Activity   Alcohol use: Not Currently    Comment: maybe one a year   Drug use: Never   Sexual activity: Yes    Birth control/protection: Surgical  Other Topics Concern   Not on file  Social History Narrative   ** Merged History Encounter **       Lives with husband Husband on disability Children grown and moved worries about family illness- mother and sister Caffeine use:     Social Determinants of Radio broadcast assistant Strain:    Difficulty of Paying Living Expenses:   Food Insecurity:    Worried About Charity fundraiser in the Last Year:    Arboriculturist in the Last Year:   Transportation Needs:    Film/video editor (Medical):    Lack of Transportation (Non-Medical):   Physical Activity:    Days of Exercise per Week:    Minutes of Exercise per Session:   Stress:    Feeling of Stress :   Social Connections:    Frequency of Communication with Friends and Family:    Frequency of Social Gatherings with Friends and Family:    Attends Religious Services:    Active Member of Clubs or Organizations:    Attends Music therapist:    Marital Status:     Review of Systems: Gen: Denies fever, chills, cold or flulike symptoms, presyncope, syncope. CV: Denies chest pain. Intermittent palpations chronically. Following with cardiology.  Resp: Denies  dyspnea.  Aside from coughing after feeling foods or liquids have gone down the wrong way, she has occasional cough related to allergies. GI: See HPI Derm: Denies rash  Heme: See HPI  Physical Exam: BP 131/82    Pulse 91    Temp (!) 97.1 F (36.2 C) (Oral)    Ht 5' 8"  (1.727 m)    Wt 241 lb 6.4 oz (109.5 kg)    BMI 36.70 kg/m  General:   Alert and oriented. No distress noted. Pleasant and cooperative.  Head:  Normocephalic and atraumatic. Eyes:  Conjuctiva clear without scleral icterus. Heart:  S1, S2 present without murmurs appreciated. Lungs:  Clear to auscultation bilaterally. No wheezes, rales, or rhonchi. No distress.  Abdomen:  +BS, soft, non-tender and non-distended. No rebound or guarding. No HSM or masses noted. Msk:  Symmetrical without gross deformities. Normal posture. Extremities:  Without edema. Neurologic:  Alert and  oriented x4 Psych:  Normal mood and affect.

## 2020-03-28 ENCOUNTER — Other Ambulatory Visit: Payer: Self-pay

## 2020-03-28 ENCOUNTER — Encounter: Payer: Self-pay | Admitting: Gastroenterology

## 2020-03-28 ENCOUNTER — Ambulatory Visit (INDEPENDENT_AMBULATORY_CARE_PROVIDER_SITE_OTHER): Payer: 59 | Admitting: Gastroenterology

## 2020-03-28 VITALS — BP 131/82 | HR 91 | Temp 97.1°F | Ht 68.0 in | Wt 241.4 lb

## 2020-03-28 DIAGNOSIS — R1310 Dysphagia, unspecified: Secondary | ICD-10-CM

## 2020-03-28 DIAGNOSIS — K219 Gastro-esophageal reflux disease without esophagitis: Secondary | ICD-10-CM | POA: Diagnosis not present

## 2020-03-28 DIAGNOSIS — R059 Cough, unspecified: Secondary | ICD-10-CM

## 2020-03-28 DIAGNOSIS — R197 Diarrhea, unspecified: Secondary | ICD-10-CM | POA: Insufficient documentation

## 2020-03-28 NOTE — Assessment & Plan Note (Signed)
Patient reports long history of dysphagia with sensations of foods and liquids going down the wrong way followed by a lot of coughing.  Feels symptoms have been progressing over time currently occurring 1-2 times a week.  Interestingly, she tells me she was treated for pneumonia in May 2021 for which she was unaware of as she did not have any symptoms.  At this time, will refer her to SLP for MBSS due to possible aspiration.  No symptoms to suggest esophageal dysphagia.

## 2020-03-28 NOTE — Assessment & Plan Note (Addendum)
Well-controlled on Protonix 40 mg daily.  Previously with associated morning nausea which has also resolved.  She does report dysphagia with feeling foods and liquids go down the wrong way followed by a lot of coughing.  Denies any sensation of foods getting hung in her esophagus.  No other alarm symptoms.  Continue Protonix 40 mg daily 30 minutes before breakfast. Refer to SLP for MBSS due to dysphagia with possible aspiration. Follow-up in 6 months.

## 2020-03-28 NOTE — Progress Notes (Signed)
Cc'ed to pcp °

## 2020-03-28 NOTE — Assessment & Plan Note (Addendum)
History of C. difficile in 2019 treated with oral vancomycin with resolution of diarrhea.  She has had a few intermittent episodes of diarrhea since then.  She was treated with Augmentin in May 2021 for pneumonia which caused diarrhea.  Diarrhea resolved when she had a "stomach bug" 2 to 3 weeks ago with diarrhea for 3 days.  Husband had similar symptoms.  2 days ago, had watery diarrhea of the flu.  Since then, stools have been soft and formed with 1 BM daily.  Patient was concerned due to 3 rounds of antibiotics within the last year for UTI x2 and pneumonia x1.  Advised that she can continue to monitor for any return of persistent diarrhea and let me know. If this occurs, we would pursue stool studies.  Otherwise, no need for evaluation at this time as diarrhea has resolved.

## 2020-03-28 NOTE — Progress Notes (Unsigned)
mo

## 2020-03-28 NOTE — Patient Instructions (Signed)
We will refer your to speech language pathology for a modified barium swallow to help evaluate possible aspiration.   Continue Protonix 40 mg daily 30 minutes before breakfast.   We will see you back in 6 months. Call with questions or concerns prior.   Aliene Altes, PA-C [facility]

## 2020-03-29 ENCOUNTER — Ambulatory Visit: Payer: 59

## 2020-03-29 DIAGNOSIS — I671 Cerebral aneurysm, nonruptured: Secondary | ICD-10-CM | POA: Diagnosis not present

## 2020-03-31 ENCOUNTER — Telehealth: Payer: Self-pay

## 2020-03-31 NOTE — Telephone Encounter (Signed)
Pt verified by name and DOB, results given per provider, pt voiced understanding all question answered. 

## 2020-03-31 NOTE — Telephone Encounter (Signed)
-----   Message from Suzzanne Cloud, NP sent at 03/31/2020  1:29 PM EDT ----- MRA was unremarkable. The previous 2 mm aneurysm was not clearly visualized in the study. I suppose we could re-check in a few years for comparsion.   IMPRESSION:   Unremarkable MRA head (without). Previously visualized 13mm anterior communicating artery aneurysm is not clearly visualized in the current study.

## 2020-04-04 ENCOUNTER — Other Ambulatory Visit (HOSPITAL_COMMUNITY): Payer: Self-pay | Admitting: Specialist

## 2020-04-04 DIAGNOSIS — R1312 Dysphagia, oropharyngeal phase: Secondary | ICD-10-CM

## 2020-04-05 ENCOUNTER — Other Ambulatory Visit: Payer: Self-pay

## 2020-04-05 ENCOUNTER — Ambulatory Visit (HOSPITAL_COMMUNITY)
Admission: RE | Admit: 2020-04-05 | Discharge: 2020-04-05 | Disposition: A | Discharge status: Home / Self Care | Payer: 59 | Source: Ambulatory Visit | Attending: Internal Medicine | Admitting: Internal Medicine

## 2020-04-05 ENCOUNTER — Ambulatory Visit (HOSPITAL_COMMUNITY)
Admission: RE | Admit: 2020-04-05 | Discharge: 2020-04-05 | Disposition: A | Discharge status: Home / Self Care | Payer: 59 | Source: Ambulatory Visit | Attending: Nurse Practitioner | Admitting: Nurse Practitioner

## 2020-04-05 DIAGNOSIS — Z9889 Other specified postprocedural states: Secondary | ICD-10-CM

## 2020-04-05 DIAGNOSIS — M8430XA Stress fracture, unspecified site, initial encounter for fracture: Secondary | ICD-10-CM

## 2020-04-05 DIAGNOSIS — C50311 Malignant neoplasm of lower-inner quadrant of right female breast: Secondary | ICD-10-CM | POA: Insufficient documentation

## 2020-04-05 DIAGNOSIS — Z171 Estrogen receptor negative status [ER-]: Secondary | ICD-10-CM | POA: Insufficient documentation

## 2020-04-11 ENCOUNTER — Ambulatory Visit (HOSPITAL_COMMUNITY)
Admission: RE | Admit: 2020-04-11 | Discharge: 2020-04-11 | Disposition: A | Discharge status: Home / Self Care | Payer: 59 | Source: Ambulatory Visit | Attending: Gastroenterology | Admitting: Gastroenterology

## 2020-04-11 ENCOUNTER — Encounter (HOSPITAL_COMMUNITY): Payer: Self-pay | Admitting: Speech Pathology

## 2020-04-11 ENCOUNTER — Other Ambulatory Visit: Payer: Self-pay

## 2020-04-11 ENCOUNTER — Ambulatory Visit (HOSPITAL_COMMUNITY): Payer: 59 | Attending: Gastroenterology | Admitting: Speech Pathology

## 2020-04-11 DIAGNOSIS — R1312 Dysphagia, oropharyngeal phase: Secondary | ICD-10-CM | POA: Diagnosis not present
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
## 2020-04-26 ENCOUNTER — Other Ambulatory Visit: Payer: Self-pay

## 2020-04-26 ENCOUNTER — Encounter: Payer: Self-pay | Admitting: *Deleted

## 2020-04-26 ENCOUNTER — Ambulatory Visit (INDEPENDENT_AMBULATORY_CARE_PROVIDER_SITE_OTHER): Payer: 59 | Admitting: Cardiology

## 2020-04-26 ENCOUNTER — Encounter: Payer: Self-pay | Admitting: Cardiology

## 2020-04-26 VITALS — BP 130/84 | HR 85 | Ht 68.0 in | Wt 238.2 lb

## 2020-04-26 DIAGNOSIS — E782 Mixed hyperlipidemia: Secondary | ICD-10-CM | POA: Diagnosis not present

## 2020-04-26 DIAGNOSIS — R002 Palpitations: Secondary | ICD-10-CM

## 2020-04-26 DIAGNOSIS — I10 Essential (primary) hypertension: Secondary | ICD-10-CM

## 2020-04-26 DIAGNOSIS — G4730 Sleep apnea, unspecified: Secondary | ICD-10-CM | POA: Diagnosis not present

## 2020-04-26 MED ORDER — DILTIAZEM HCL ER COATED BEADS 180 MG PO CP24: 180.0000 mg | ORAL_CAPSULE | Freq: Every day | ORAL | 1 refills | Status: DC

## 2020-04-26 NOTE — Patient Instructions (Signed)
Your physician wants you to follow-up in: [place] will receive a reminder letter in the mail two months in advance. If you don't receive a letter, please call our office to schedule the follow-up appointment.  Your physician has recommended you make the following change in your medication:   INCREASE DILTIAZEM 180 MG DAILY   You have been referred to PULMONARY   Thank you for choosing [facility]!!

## 2020-04-26 NOTE — Progress Notes (Signed)
Clinical Summary Sarah Cooley is a 54 y.o.female seen today for follow up of the following medical problems.   1. HTN -compliant with meds   2. Hyperlipidemia - compliant with statin - most recent labs with pcp   3. Dilated main pulmonary artery - noted on 12/2017 CT chest - prior echo in 2016 unable to measure PASP, inadequate TR jet. Normal RV. 07/2018 echo LVEF 55-60%, no WMAs, normal RV, no TR - no other supportive signs of pulmonary HTN   4. OSA on cpap - using cpap machcine. - has not been reevaluated in several years  5. Chest pain - 08/2018 nuclear stress no ischeiima.  No recent symptoms. She does have a histoyr of fibromyalgia  - rare chest pains at times, no specific pattern.    6. Palpitations - palpitations occur 3-5 times, no specific trigger. Just a few seconds - limiting caffeine intake, no EtOH.    8.  Left kidney clear-cell renal cell carcinoma: -Status post partial nephrectomy on 06/19/2018     SH: applying for disability. WOrked as prior Psychologist, sport and exercise.  Past Medical History:  Diagnosis Date  . Allergy    seasonal  . Anxiety   . Arthritis   . Balance problem   . Brain aneurysm    2 mm ACA region aneurysm by 09/21/15 MRA  . Breast cancer ([place])   . Breast cancer of lower-inner quadrant of right female breast ([place]) 08/26/2015  . Chronic kidney disease    acute renal failure one occasion  . Chronic low back pain 12/02/2018  . Complication of anesthesia    heart rate drops and O2 sats drop  . Constipation   . Depression   . Diplopia 10/29/2017  . Dizziness 10/29/2017  . Elevated liver function tests   . Family history of adverse reaction to anesthesia    mom has n/v  . Family history of breast cancer   . Family history of colon cancer   . Fibromyalgia   . Fibromyalgia   . Gait abnormality 10/29/2017  . Genetic testing 09/08/2015   Negative genetic testing on the Breast/High Moderate Risk panel. The Breast High/Moderate Risk  gene panel offered by [facility] includes sequencing and deletion/duplication analysis of the following 9 genes: ATM, BRCA1, BRCA2, CDH1, CHEK2, PALB2, PTEN, STK11, and TP53. The report date is September 08, 2015.  The Rest of the Comprehensive Cancer panel has been reflexed and will be available in 2-3 weeks.  POLD1 c.208G>T VUS found on the Remainder of the Comprehensive cancer panel.  The Comprehensive Cancer Panel offered by [facility] includes sequencing and/or deletion duplication testing of the following 32 genes: APC, ATM, AXIN2, BARD1, BMPR1A, BRCA1, BRCA2, BRIP1, CDH1, CDK4, CDKN2A, CHEK2, EPCAM, FANCC, MLH1, MSH2, MSH6, MUTYH, NBN, PALB2, PMS2, POLD1, POLE, PTEN, RAD51C, RAD51D, SCG5/GREM1, SMAD4, STK11, TP53, VHL, and XRCC2.   The report date is 09/12/2015.   Marland Kitchen GERD (gastroesophageal reflux disease)    none recent  . H/O acute renal failure 09/22/15   admitted to [facility]  . Headache    occipital and temporal  . History of hiatal hernia   . Hyperlipemia   . Hyperlipidemia   . Hypertension   . Irritable bowel syndrome (IBS)   . Lyme disease   . MGUS (monoclonal gammopathy of unknown significance) 03/15/2016  . Neuromuscular disorder (HCC)    fibromyalgia  . Occasional tremors   . OSA (obstructive sleep apnea)    uses cpap with humidification- auto   .  Personal history of radiation therapy   . PONV (postoperative nausea and vomiting)   . Renal cell carcinoma ([place])   . Tremor, essential 10/29/2017     Allergies  Allergen Reactions  . Ramipril Nausea And Vomiting and Other (See Comments)    Ramipril--Caused Pancreatitis  . Minocycline Hcl Hives  . Other Rash    ADHESIVE GLUE USED FOR INCISIONS  Anti-inflammatory medicine - pt stated, "Upsets my GI tract" Please use paper tape Please use paper tape Please use paper tape Patient is allergic to many adhesives   . Altace [Ramipril] Nausea And Vomiting and Nausea Only    Drug induced pancreatitis   . Lyrica [Pregabalin] Nausea  And Vomiting  . Adhesive [Tape] Rash    Please use paper tape Patient is allergic to many adhesives   . Bactrim [Sulfamethoxazole-Trimethoprim] Other (See Comments)    May have caused kidney issues  . Drixoral [Brompheniramine-Pseudoeph] Rash  . Erythromycin Other (See Comments)    GI- Upset / severe abdominal pain  . Minocin [Minocycline Hcl] Nausea And Vomiting and Rash  . Septra [Sulfamethoxazole-Trimethoprim] Rash  . Sinutab [Chlorphen-Pseudoephed-Apap] Rash     Current Outpatient Medications  Medication Sig Dispense Refill  . calcium carbonate (TUMS - DOSED IN MG ELEMENTAL CALCIUM) 500 MG chewable tablet Chew 1 tablet (200 mg of elemental calcium total) by mouth 2 (two) times daily with breakfast and lunch.    . clonazePAM (KLONOPIN) 0.5 MG tablet Take 0.5 mg by mouth daily as needed.    . cyclobenzaprine (FLEXERIL) 10 MG tablet Take 10 mg by mouth 3 (three) times daily.    Marland Kitchen DILT-XR 120 MG 24 hr capsule Take 120 mg by mouth daily.   0  . FLUoxetine (PROZAC) 20 MG capsule Take 60 mg by mouth daily.  12  . furosemide (LASIX) 20 MG tablet Take 20 mg by mouth daily. PRN    . HYDROcodone-acetaminophen (NORCO) 7.5-325 MG tablet Take 1 tablet by mouth 3 (three) times daily as needed. for pain    . losartan (COZAAR) 25 MG tablet Take 25 mg by mouth daily.  0  . meloxicam (MOBIC) 7.5 MG tablet Take 7.5 mg by mouth 2 (two) times daily.    Marland Kitchen OVER THE COUNTER MEDICATION Take 1 capsule by mouth daily as needed. Diphenhydramate Over The Counter Anti-emetic    . pantoprazole (PROTONIX) 40 MG tablet TAKE ONE TABLET BY MOUTH EVERY MORNING PRIOR TO BREAKFAST 30 tablet 5  . pramipexole (MIRAPEX) 0.125 MG tablet TAKE 1 TABLET BY MOUTH DAILY AT DINNERTIME AND TAKE 2 TABLETS BY MOUTH AT BEDTIME 90 tablet 5  . pravastatin (PRAVACHOL) 10 MG tablet Take 10 mg by mouth daily.    Marland Kitchen topiramate (TOPAMAX) 50 MG tablet Take 2 twice daily (Patient taking differently: Take 100 mg by mouth 2 (two) times daily.  Take 2 twice daily) 360 tablet 1  . Vitamin D, Ergocalciferol, (DRISDOL) 50000 units CAPS capsule Take 50,000 Units by mouth every 7 (seven) days. Take 1 tablet (50,000 units) by mouth every Thursday     No current facility-administered medications for this visit.     Past Surgical History:  Procedure Laterality Date  . ABDOMINAL HYSTERECTOMY     adenomyosis  . ABDOMINAL HYSTERECTOMY    . BLADDER SURGERY    . BONE GRAFT HIP ILIAC CREST    . BREAST BIOPSY Bilateral   . BREAST LUMPECTOMY    . BREAST SURGERY    . CHOLECYSTECTOMY    . COLONOSCOPY WITH  PROPOFOL  05/14/2016   [facility]; entire examined colon normal.  Recommendations to repeat colonoscopy in 10 years for screening purposes.  . ESOPHAGOGASTRODUODENOSCOPY (EGD) WITH PROPOFOL  03/24/2018   [facility]; normal esophagus, erythematous mucosa in the gastric body and antrum s/p biopsy, normal examined duodenum s/p biopsy.  Pathology with benign duodenal biopsy.  Stomach biopsies with chronic inactive gastritis, negative for H. pylori.  . FRACTURE SURGERY Right    wrist  . KNEE ARTHROSCOPY    . KNEE ARTHROSCOPY W/ ACL RECONSTRUCTION     right  . RADIOACTIVE SEED GUIDED PARTIAL MASTECTOMY WITH AXILLARY SENTINEL LYMPH NODE BIOPSY Right 10/13/2015   Procedure: RADIOACTIVE SEED GUIDED PARTIAL MASTECTOMY WITH AXILLARY SENTINEL LYMPH NODE BIOPSY;  Surgeon: Erroll Luna, MD;  Location: [facility];  Service: General;  Laterality: Right;  . right carpal and cubital tunnel release     . ROBOTIC ASSITED PARTIAL NEPHRECTOMY Left 06/19/2018   Procedure: XI ROBOTIC ASSITED PARTIAL NEPHRECTOMY;  Surgeon: Ardis Hughs, MD;  Location: [facility];  Service: Urology;  Laterality: Left;  . WRIST SURGERY       Allergies  Allergen Reactions  . Ramipril Nausea And Vomiting and Other (See Comments)    Ramipril--Caused Pancreatitis  . Minocycline Hcl Hives  . Other Rash    ADHESIVE GLUE USED FOR INCISIONS  Anti-inflammatory medicine - pt stated,  "Upsets my GI tract" Please use paper tape Please use paper tape Please use paper tape Patient is allergic to many adhesives   . Altace [Ramipril] Nausea And Vomiting and Nausea Only    Drug induced pancreatitis   . Lyrica [Pregabalin] Nausea And Vomiting  . Adhesive [Tape] Rash    Please use paper tape Patient is allergic to many adhesives   . Bactrim [Sulfamethoxazole-Trimethoprim] Other (See Comments)    May have caused kidney issues  . Drixoral [Brompheniramine-Pseudoeph] Rash  . Erythromycin Other (See Comments)    GI- Upset / severe abdominal pain  . Minocin [Minocycline Hcl] Nausea And Vomiting and Rash  . Septra [Sulfamethoxazole-Trimethoprim] Rash  . Sinutab [Chlorphen-Pseudoephed-Apap] Rash      Family History  Problem Relation Age of Onset  . Hypertension Mother   . Autoimmune disease Mother        lichen planus  . Eczema Mother   . Parkinson's disease Mother   . Mental illness Mother        bipolar  . Heart disease Maternal Aunt   . Lung cancer Paternal Aunt   . Breast cancer Paternal Aunt        dx in his 23s  . Parkinsonism Paternal Aunt   . Lung cancer Paternal Grandfather   . Colon cancer Paternal Uncle        dx in his 58s  . Colon cancer Paternal Uncle        dx in his 41s  . Melanoma Father 41  . Psoriasis Father   . Hypertension Father   . Heart disease Father   . Breast cancer Sister        dx in her 65s  . Lupus Sister   . COPD Maternal Uncle   . Skin cancer Maternal Uncle   . Colon cancer Paternal Uncle        dx in his 50s  . Mental illness Son        bipolar  . Polycystic ovary syndrome Other      Social History Sarah Cooley reports that she has never smoked. She has never used  smokeless tobacco. Sarah Cooley reports previous alcohol use.   Review of Systems CONSTITUTIONAL: No weight loss, fever, chills, weakness or fatigue.  HEENT: Eyes: No visual loss, blurred vision, Cooley vision or yellow sclerae.No hearing loss, sneezing,  congestion, runny nose or sore throat.  SKIN: No rash or itching.  CARDIOVASCULAR: per hpi RESPIRATORY: No shortness of breath, cough or sputum.  GASTROINTESTINAL: No anorexia, nausea, vomiting or diarrhea. No abdominal pain or blood.  GENITOURINARY: No burning on urination, no polyuria NEUROLOGICAL: No headache, dizziness, syncope, paralysis, ataxia, numbness or tingling in the extremities. No change in bowel or bladder control.  MUSCULOSKELETAL: No muscle, back pain, joint pain or stiffness.  LYMPHATICS: No enlarged nodes. No history of splenectomy.  PSYCHIATRIC: No history of depression or anxiety.  ENDOCRINOLOGIC: No reports of sweating, cold or heat intolerance. No polyuria or polydipsia.  Marland Kitchen   Physical Examination Today's Vitals   04/26/20 1606  BP: 130/84  Pulse: 85  SpO2: 94%  Weight: 238 lb 3.2 oz (108 kg)  Height: 5' 8"  (1.727 m)   Body mass index is 36.22 kg/m.  Gen: resting comfortably, no acute distress HEENT: no scleral icterus, pupils equal round and reactive, no palptable cervical adenopathy,  CV: RRR, no m/r/g, no jvd Resp: Clear to auscultation bilaterally GI: abdomen is soft, non-tender, non-distended, normal bowel sounds, no hepatosplenomegaly MSK: extremities are warm, no edema.  Skin: warm, no rash Neuro:  no focal deficits Psych: appropriate affect   Diagnostic Studies 06/2015 echo Study Conclusions  - Left ventricle: The cavity size was normal. Wall thickness was normal. Systolic function was normal. The estimated ejection fraction was in the range of 60% to 65%. Wall motion was normal; there were no regional wall motion abnormalities. Left ventricular diastolic function parameters were normal. - Aortic valve: Valve area (VTI): 2.67 cm^2. Valve area (Vmax): 3.29 cm^2. - Atrial septum: No defect or patent foramen ovale was identified. - Technically adequate study.  06/2015 nuclear stress test  There was no ST segment deviation noted  during stress after Lexiscan injection  The study is normal. There are no perfusion defects consistent with ischemia or prior infarct.  This is a low risk study.  Nuclear stress EF: 63%.   07/2018 echo Study Conclusions  - Left ventricle: The cavity size was normal. Systolic function was normal. The estimated ejection fraction was in the range of 55% to 60%. Wall motion was normal; there were no regional wall motion abnormalities. - Aortic valve: Transvalvular velocity was within the normal range. There was no stenosis. There was no regurgitation. - Mitral valve: Transvalvular velocity was within the normal range. There was no evidence for stenosis. There was no regurgitation. - Right ventricle: The cavity size was normal. Wall thickness was normal. Systolic function was normal. - Atrial septum: No defect or patent foramen ovale was identified by color flow Doppler. - Tricuspid valve: There was no regurgitation.   08/2018 carotid US IMPRESSION: 1. No significant carotid bifurcation plaque or stenosis. 2. Antegrade bilateral vertebral arterial flow.   08/2018 nuclear stress  Blood pressure demonstrated a hypertensive response to exercise.  There was no ST segment deviation noted during stress.  The study is normal.  This is a low risk study.  The left ventricular ejection fraction is normal (55-65%).  Normal resting and stress perfusion. No ischemia or infarction EF 61%    Assessment and Plan  1. HTN - at goal, due to palpitations we are increasing her dilt. Follow bp's  2. Hyperlipidemia -  request pcp labs, continue statin  3. Palpitations - some ongoing symptoms, short induration, would suggest likely benign ectopy - increase dilt to 158m daily - if significant progression of symptoms would plan for heart monitor\ - EKG today shows NSR  4. OSA - has not been reevaluated in several years, refer to [facility]   JArnoldo Lenis M.D

## 2020-04-29 ENCOUNTER — Ambulatory Visit (INDEPENDENT_AMBULATORY_CARE_PROVIDER_SITE_OTHER): Payer: 59

## 2020-04-29 ENCOUNTER — Encounter: Payer: Self-pay | Admitting: Podiatry

## 2020-04-29 ENCOUNTER — Other Ambulatory Visit: Payer: Self-pay

## 2020-04-29 ENCOUNTER — Ambulatory Visit (INDEPENDENT_AMBULATORY_CARE_PROVIDER_SITE_OTHER): Payer: 59 | Admitting: Podiatry

## 2020-04-29 DIAGNOSIS — S92301A Fracture of unspecified metatarsal bone(s), right foot, initial encounter for closed fracture: Secondary | ICD-10-CM | POA: Diagnosis not present

## 2020-05-02 NOTE — Progress Notes (Signed)
Subjective:   Patient ID: Sarah Cooley, female   DOB: 54 y.o.   MRN: [id number]   HPI Patient states still having some discomfort but seems to be improving and has been wearing boot fairly consistently   ROS      Objective:  Physical Exam  Neurovascular status intact with significant diminishment of discomfort lateral side right foot with pain still present only upon deep palpation     Assessment:  Fracture of the base of the fifth metatarsal right which appears to be healing but still having moderate swelling discomfort     Plan:  Reviewed x-ray encourage patient to continue with ice therapy anti-inflammatories and gradual reduction of the boot over the next 4 to 6 weeks.  Hopefully this will solve uneventfully and she understands if it does not heal ultimately it may require fixation with possible bone graft  X-ray indicates there appears to be healing of the fifth metatarsal right that is gradually recovering

## 2020-05-25 ENCOUNTER — Inpatient Hospital Stay (HOSPITAL_COMMUNITY): Payer: 59 | Attending: Hematology

## 2020-05-25 ENCOUNTER — Other Ambulatory Visit: Payer: Self-pay

## 2020-05-25 DIAGNOSIS — C50311 Malignant neoplasm of lower-inner quadrant of right female breast: Secondary | ICD-10-CM | POA: Insufficient documentation

## 2020-05-25 LAB — CBC WITH DIFFERENTIAL/PLATELET
Abs Immature Granulocytes: 0.01 10*3/uL (ref 0.00–0.07)
Basophils Absolute: 0 10*3/uL (ref 0.0–0.1)
Basophils Relative: 0 %
Eosinophils Absolute: 0.2 10*3/uL (ref 0.0–0.5)
Eosinophils Relative: 3 %
HCT: 40.6 % (ref 36.0–46.0)
Hemoglobin: 13.3 g/dL (ref 12.0–15.0)
Immature Granulocytes: 0 %
Lymphocytes Relative: 23 %
Lymphs Abs: 1.2 10*3/uL (ref 0.7–4.0)
MCH: 31.2 pg (ref 26.0–34.0)
MCHC: 32.8 g/dL (ref 30.0–36.0)
MCV: 95.3 fL (ref 80.0–100.0)
Monocytes Absolute: 0.5 10*3/uL (ref 0.1–1.0)
Monocytes Relative: 9 %
Neutro Abs: 3.3 10*3/uL (ref 1.7–7.7)
Neutrophils Relative %: 65 %
Platelets: 204 10*3/uL (ref 150–400)
RBC: 4.26 MIL/uL (ref 3.87–5.11)
RDW: 12.5 % (ref 11.5–15.5)
WBC: 5.1 10*3/uL (ref 4.0–10.5)
nRBC: 0 % (ref 0.0–0.2)

## 2020-05-25 LAB — VITAMIN D 25 HYDROXY (VIT D DEFICIENCY, FRACTURES): Vit D, 25-Hydroxy: 49.04 ng/mL (ref 30–100)

## 2020-05-25 LAB — FOLATE: Folate: 9.7 ng/mL (ref >5.9)

## 2020-05-25 LAB — COMPREHENSIVE METABOLIC PANEL
ALT: 48 U/L — ABNORMAL HIGH (ref 0–44)
AST: 27 U/L (ref 15–41)
Albumin: 4.1 g/dL (ref 3.5–5.0)
Alkaline Phosphatase: 62 U/L (ref 38–126)
Anion gap: 11 (ref 5–15)
BUN: 19 mg/dL (ref 6–20)
CO2: 21 mmol/L — ABNORMAL LOW (ref 22–32)
Calcium: 9.4 mg/dL (ref 8.9–10.3)
Chloride: 107 mmol/L (ref 98–111)
Creatinine, Ser: 0.89 mg/dL (ref 0.44–1.00)
GFR calc Af Amer: >60 mL/min (ref >60)
GFR calc non Af Amer: >60 mL/min (ref >60)
Glucose, Bld: 115 mg/dL — ABNORMAL HIGH (ref 70–99)
Potassium: 3.6 mmol/L (ref 3.5–5.1)
Sodium: 139 mmol/L (ref 135–145)
Total Bilirubin: 0.4 mg/dL (ref 0.3–1.2)
Total Protein: 7.5 g/dL (ref 6.5–8.1)

## 2020-05-25 LAB — VITAMIN B12: Vitamin B-12: 237 pg/mL (ref 180–914)

## 2020-05-25 LAB — LACTATE DEHYDROGENASE: LDH: 184 U/L (ref 98–192)

## 2020-05-26 LAB — IGG: IgG (Immunoglobin G), Serum: 917 mg/dL (ref 586–1602)

## 2020-05-26 LAB — PROTEIN ELECTROPHORESIS, SERUM
A/G Ratio: 1.2 (ref 0.7–1.7)
Albumin ELP: 3.7 g/dL (ref 2.9–4.4)
Alpha-1-Globulin: 0.2 g/dL (ref 0.0–0.4)
Alpha-2-Globulin: 0.8 g/dL (ref 0.4–1.0)
Beta Globulin: 1.2 g/dL (ref 0.7–1.3)
Gamma Globulin: 1.1 g/dL (ref 0.4–1.8)
Globulin, Total: 3.2 g/dL (ref 2.2–3.9)
Total Protein ELP: 6.9 g/dL (ref 6.0–8.5)

## 2020-05-26 LAB — KAPPA/LAMBDA LIGHT CHAINS
Kappa free light chain: 33.7 mg/L — ABNORMAL HIGH (ref 3.3–19.4)
Kappa, lambda light chain ratio: 1.77 — ABNORMAL HIGH (ref 0.26–1.65)
Lambda free light chains: 19 mg/L (ref 5.7–26.3)

## 2020-05-26 LAB — IGA: IgA: 462 mg/dL — ABNORMAL HIGH (ref 87–352)

## 2020-05-26 LAB — IGG, IGA, IGM
IgA: 454 mg/dL — ABNORMAL HIGH (ref 87–352)
IgG (Immunoglobin G), Serum: 919 mg/dL (ref 586–1602)
IgM (Immunoglobulin M), Srm: 76 mg/dL (ref 26–217)

## 2020-05-26 LAB — IGM: IgM (Immunoglobulin M), Srm: 72 mg/dL (ref 26–217)

## 2020-06-01 ENCOUNTER — Ambulatory Visit (INDEPENDENT_AMBULATORY_CARE_PROVIDER_SITE_OTHER): Payer: 59 | Admitting: Neurology

## 2020-06-01 ENCOUNTER — Ambulatory Visit (HOSPITAL_COMMUNITY): Payer: 59 | Admitting: Nurse Practitioner

## 2020-06-01 ENCOUNTER — Encounter: Payer: Self-pay | Admitting: Neurology

## 2020-06-01 VITALS — BP 118/80 | HR 88 | Ht 68.0 in | Wt 238.0 lb

## 2020-06-01 DIAGNOSIS — R519 Headache, unspecified: Secondary | ICD-10-CM | POA: Diagnosis not present

## 2020-06-01 DIAGNOSIS — G250 Essential tremor: Secondary | ICD-10-CM

## 2020-06-01 DIAGNOSIS — I671 Cerebral aneurysm, nonruptured: Secondary | ICD-10-CM | POA: Diagnosis not present

## 2020-06-01 DIAGNOSIS — M545 Low back pain, unspecified: Secondary | ICD-10-CM

## 2020-06-01 DIAGNOSIS — G8929 Other chronic pain: Secondary | ICD-10-CM

## 2020-06-01 MED ORDER — TOPIRAMATE 50 MG PO TABS: ORAL_TABLET | ORAL | 1 refills | Status: AC

## 2020-06-01 NOTE — Progress Notes (Signed)
I have read the note, and I agree with the clinical assessment and plan.  Eleanore Junio K Jerid Catherman   

## 2020-06-01 NOTE — Patient Instructions (Signed)
We can try a slight increase in Topamax 100 mg in the morning 150 mg in the evening I will check on the ESI, let you know  Great idea to get a sleep study! See you back in 6 months
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
## 2020-06-08 ENCOUNTER — Inpatient Hospital Stay (HOSPITAL_BASED_OUTPATIENT_CLINIC_OR_DEPARTMENT_OTHER): Payer: 59 | Admitting: Nurse Practitioner

## 2020-06-08 DIAGNOSIS — Z171 Estrogen receptor negative status [ER-]: Secondary | ICD-10-CM

## 2020-06-08 DIAGNOSIS — D472 Monoclonal gammopathy: Secondary | ICD-10-CM | POA: Diagnosis not present

## 2020-06-08 DIAGNOSIS — C50311 Malignant neoplasm of lower-inner quadrant of right female breast: Secondary | ICD-10-CM | POA: Diagnosis not present

## 2020-06-08 NOTE — Assessment & Plan Note (Signed)
1.  Right breast high-grade DCIS: -Status post right lumpectomy and sentinel lymph node biopsy on 10/03/2015 by Dr. Brantley Stage.  0.4 cm high-grade DCIS, 0/1 lymph nodes positive, ER/PR negative. -She underwent radiation therapy in the adjuvant setting. -On 05/01/2017 she underwent left breast biopsy upper outer quadrant-fibroadenoma.  Left breast upper inner quadrant biopsy showed fibrocystic change. -Today's physical examination did not show any palpable masses.  Stable lumpectomy scar in the inferior margin of the areola. -Last mammogram on 04/05/2020 was BI-RADS Category 2 benign. -Genetic testing was negative. -Labs done on 05/25/2020 were WNL. -She will follow-up after her mammogram in June.  2.  Elevated free light chain ratio: -She had elevated free kappa light chains with elevated ratio. -Blood work on 05/25/2019 showed SPEP negative.  Free light chain ratio was elevated at 2.19.  Kappa light chains 36.6 -Blood work on 05/25/2020 showed SPEP negative.  Free light chain ratio was 1.77.  Kappa light chains 33.7 -Hemoglobin, creatinine and calcium were normal. -We will repeat these labs at her next visit.  3.  Left kidney clear-cell renal cell carcinoma: -Status post partial nephrectomy on 06/19/2018, RCC, clear cell type, grade 2, 1.5 cm, pT1a, negative margins. -Bone scan on 08/07/2018 was negative. -She will follow-up with her urologist.

## 2020-06-08 NOTE — Progress Notes (Signed)
Early Cancer Follow up:    Sarah Squibb, MD [place] [address]   DIAGNOSIS:Breast cancer and light chain elevation    Cancer Staging Breast cancer of lower-inner quadrant of right female breast [facility]) Staging form: Breast, AJCC 7th Edition - Clinical stage from 08/31/2015: Stage 0 (Tis (DCIS), N0, M0) - Unsigned Staging comments: Staged at breast conference on 11.9.16 - Pathologic stage from 10/13/2015: Stage 0 (Tis (DCIS), N0, cM0) - Signed by Holley Bouche, NP on 02/09/2016   SUMMARY OF ONCOLOGIC HISTORY: Oncology History Overview Note  Stage 0: Right breast DCIS, high-grade, ER/PR negative Clear Cell RCC   Breast cancer of lower-inner quadrant of right female breast ([place])  08/19/2015 Breast US   Masslike asymmetry within the lower inner quadrant of the right breast, at posterior depth, with associated microcalcifications which are new.    08/23/2015 Initial Biopsy   AT LEAST HIGH DUCTAL CARCINOMA IN SITU   08/23/2015 Receptors her2   Estrogen Receptor: 0%, NEGATIVE Progesterone Receptor: 0%, NEGATIVE   08/26/2015 Initial Diagnosis   Breast cancer of lower-inner quadrant of right female breast ([place])   08/31/2015 Procedure   [facility] negative.  Genes tested include: ATM, BRCA1, BRCA2, CDH1, CHEK2, PALB2, PTEN, & TP53.    10/13/2015 Pathologic Stage   pTis, pN0: Stage 0    10/13/2015 Surgery   Right lumpectomy & SLNB ([name]). High grade DCIS, 0.4 cm. Negative margins.  1 right axillary SLN neg. ER/PR repeated and both remain negative.    11/17/2015 - 12/21/2015 Radiation Therapy   Treated at [facility]). Right breast: Total dose 50 Gy in 25 fractions. 3-D tangents; 6 MV photons.    08/22/2018 Genetic Testing   Negative genetic testing on the CancerNExt Expanded+RNA insight panel.  The CancerNext-Expanded gene panel offered by [facility] and includes sequencing and rearrangement analysis for the following 67  genes: AIP, ALK, APC*, ATM*, BAP1, BARD1, BLM, BMPR1A, BRCA1*, BRCA2*, BRIP1*, CDH1*, CDK4, CDKN1B, CDKN2A, CHEK2*, DICER1, FANCC, FH, FLCN, GALNT12, HOXB13, MAX, MEN1, MET, MLH1*, MRE11A, MSH2*, MSH6*, MUTYH*, NBN, NF1*, NF2, PALB2*, PHOX2B, PMS2*, POLD1, POLE, POT1, PRKAR1A, PTCH1, PTEN*, RAD50, RAD51C*, RAD51D*, RB1, RET, SDHA, SDHAF2, SDHB, SDHC, SDHD, SMAD4, SMARCA4, SMARCB1, SMARCE1, STK11, SUFU, TMEM127, TP53*, TSC1, TSC2, VHL and XRCC2 (sequencing and deletion/duplication); MITF (sequencing only); EPCAM and GREM1 (deletion/duplication only). DNA and RNA analyses performed for * genes. The report date is 08/22/2018.   Renal cancer, left ([place])  07/31/2018 Initial Diagnosis   Renal cancer, left ([place])   08/22/2018 Genetic Testing   Negative genetic testing on the CancerNExt Expanded+RNA insight panel.  The CancerNext-Expanded gene panel offered by [facility] and includes sequencing and rearrangement analysis for the following 67 genes: AIP, ALK, APC*, ATM*, BAP1, BARD1, BLM, BMPR1A, BRCA1*, BRCA2*, BRIP1*, CDH1*, CDK4, CDKN1B, CDKN2A, CHEK2*, DICER1, FANCC, FH, FLCN, GALNT12, HOXB13, MAX, MEN1, MET, MLH1*, MRE11A, MSH2*, MSH6*, MUTYH*, NBN, NF1*, NF2, PALB2*, PHOX2B, PMS2*, POLD1, POLE, POT1, PRKAR1A, PTCH1, PTEN*, RAD50, RAD51C*, RAD51D*, RB1, RET, SDHA, SDHAF2, SDHB, SDHC, SDHD, SMAD4, SMARCA4, SMARCB1, SMARCE1, STK11, SUFU, TMEM127, TP53*, TSC1, TSC2, VHL and XRCC2 (sequencing and deletion/duplication); MITF (sequencing only); EPCAM and GREM1 (deletion/duplication only). DNA and RNA analyses performed for * genes. The report date is 08/22/2018.     CURRENT THERAPY: Observation  INTERVAL HISTORY: Sarah Cooley 54 y.o. female returns for breast cancer. She is doing great since her last visit. She denies any new lumps or bumps present.  She denies  any new issues with bleeding. She denies any new bone pain. Denies any nausea, vomiting, or diarrhea. Denies any new pains. Had not noticed any  recent bleeding such as epistaxis, hematuria or hematochezia. Denies recent chest pain on exertion, shortness of breath on minimal exertion, pre-syncopal episodes, or palpitations. Denies any numbness or tingling in hands or feet. Denies any recent fevers, infections, or recent hospitalizations. Patient reports appetite at 100% and energy level at 75%. She is eating well and maintaining her weight at this time.    Patient Active Problem List   Diagnosis Date Noted  . Dysphagia 03/28/2020  . Diarrhea 03/28/2020  . Brain aneurysm   . Constipation 09/17/2019  . Nausea without vomiting 09/16/2019  . Chronic low back pain 12/02/2018  . Right carotid bruit 09/08/2018  . Pulmonary artery hypertension ([place]) 09/03/2018  . Chest pain 08/01/2018  . Family history of melanoma 07/31/2018  . Renal cancer, left ([place]) 07/31/2018  . Renal mass, left 06/19/2018  . Clostridium difficile diarrhea   . Weakness   . Hypokalemia, gastrointestinal losses 05/25/2018  . Hypokalemia 05/24/2018  . Dizziness 10/29/2017  . Gait abnormality 10/29/2017  . Tremor, essential 10/29/2017  . Diplopia 10/29/2017  . Carpal tunnel syndrome of right wrist 06/20/2017  . Cubital tunnel syndrome on right 06/20/2017  . Obstructive sleep apnea on CPAP 12/10/2016  . Plantar fasciitis, bilateral 12/10/2016  . Fibromyalgia 11/09/2016  . IBS (irritable bowel syndrome) 11/09/2016  . GERD (gastroesophageal reflux disease) 11/09/2016  . Anxiety and depression 11/09/2016  . MGUS (monoclonal gammopathy of unknown significance) 03/15/2016  . Genetic testing 09/08/2015  . Family history of breast cancer   . Family history of colon cancer   . Breast cancer of lower-inner quadrant of right female breast ([place]) 08/26/2015  . Headache 07/17/2015  . Hypertension 07/17/2015  . Obesity (BMI 35.0-39.9 without comorbidity) 07/17/2015    is allergic to ramipril, minocycline hcl, other, altace [ramipril], lyrica [pregabalin], adhesive  [tape], bactrim [sulfamethoxazole-trimethoprim], drixoral [brompheniramine-pseudoeph], erythromycin, minocin [minocycline hcl], septra [sulfamethoxazole-trimethoprim], and sinutab [chlorphen-pseudoephed-apap].  MEDICAL HISTORY: Past Medical History:  Diagnosis Date  . Allergy    seasonal  . Anxiety   . Arthritis   . Balance problem   . Brain aneurysm    2 mm ACA region aneurysm by 09/21/15 MRA  . Breast cancer ([place])   . Breast cancer of lower-inner quadrant of right female breast ([place]) 08/26/2015  . Chronic kidney disease    acute renal failure one occasion  . Chronic low back pain 12/02/2018  . Complication of anesthesia    heart rate drops and O2 sats drop  . Constipation   . Depression   . Diplopia 10/29/2017  . Dizziness 10/29/2017  . Elevated liver function tests   . Family history of adverse reaction to anesthesia    mom has n/v  . Family history of breast cancer   . Family history of colon cancer   . Fibromyalgia   . Fibromyalgia   . Gait abnormality 10/29/2017  . Genetic testing 09/08/2015   Negative genetic testing on the Breast/High Moderate Risk panel. The Breast High/Moderate Risk gene panel offered by [facility] includes sequencing and deletion/duplication analysis of the following 9 genes: ATM, BRCA1, BRCA2, CDH1, CHEK2, PALB2, PTEN, STK11, and TP53. The report date is September 08, 2015.  The Rest of the Comprehensive Cancer panel has been reflexed and will be available in 2-3 weeks.  POLD1 c.208G>T VUS found on the Remainder of the Comprehensive cancer panel.  The  Comprehensive Cancer Panel offered by [facility] includes sequencing and/or deletion duplication testing of the following 32 genes: APC, ATM, AXIN2, BARD1, BMPR1A, BRCA1, BRCA2, BRIP1, CDH1, CDK4, CDKN2A, CHEK2, EPCAM, FANCC, MLH1, MSH2, MSH6, MUTYH, NBN, PALB2, PMS2, POLD1, POLE, PTEN, RAD51C, RAD51D, SCG5/GREM1, SMAD4, STK11, TP53, VHL, and XRCC2.   The report date is 09/12/2015.   Marland Kitchen GERD (gastroesophageal reflux disease)     none recent  . H/O acute renal failure 09/22/15   admitted to [facility]  . Headache    occipital and temporal  . History of hiatal hernia   . Hyperlipemia   . Hyperlipidemia   . Hypertension   . Irritable bowel syndrome (IBS)   . Lyme disease   . MGUS (monoclonal gammopathy of unknown significance) 03/15/2016  . Neuromuscular disorder ([facility])    fibromyalgia  . Occasional tremors   . OSA (obstructive sleep apnea)    uses cpap with humidification- auto   . Personal history of radiation therapy   . PONV (postoperative nausea and vomiting)   . Renal cell carcinoma ([place])   . Tremor, essential 10/29/2017    SURGICAL HISTORY: Past Surgical History:  Procedure Laterality Date  . ABDOMINAL HYSTERECTOMY     adenomyosis  . ABDOMINAL HYSTERECTOMY    . BLADDER SURGERY    . BONE GRAFT HIP ILIAC CREST    . BREAST BIOPSY Bilateral   . BREAST LUMPECTOMY    . BREAST SURGERY    . CHOLECYSTECTOMY    . COLONOSCOPY WITH PROPOFOL  05/14/2016   [facility]; entire examined colon normal.  Recommendations to repeat colonoscopy in 10 years for screening purposes.  . ESOPHAGOGASTRODUODENOSCOPY (EGD) WITH PROPOFOL  03/24/2018   [facility]; normal esophagus, erythematous mucosa in the gastric body and antrum s/p biopsy, normal examined duodenum s/p biopsy.  Pathology with benign duodenal biopsy.  Stomach biopsies with chronic inactive gastritis, negative for H. pylori.  . FRACTURE SURGERY Right    wrist  . KNEE ARTHROSCOPY    . KNEE ARTHROSCOPY W/ ACL RECONSTRUCTION     right  . RADIOACTIVE SEED GUIDED PARTIAL MASTECTOMY WITH AXILLARY SENTINEL LYMPH NODE BIOPSY Right 10/13/2015   Procedure: RADIOACTIVE SEED GUIDED PARTIAL MASTECTOMY WITH AXILLARY SENTINEL LYMPH NODE BIOPSY;  Surgeon: Erroll Luna, MD;  Location: [facility];  Service: General;  Laterality: Right;  . right carpal and cubital tunnel release     . ROBOTIC ASSITED PARTIAL NEPHRECTOMY Left 06/19/2018   Procedure: XI ROBOTIC ASSITED PARTIAL  NEPHRECTOMY;  Surgeon: Ardis Hughs, MD;  Location: [facility];  Service: Urology;  Laterality: Left;  . WRIST SURGERY      SOCIAL HISTORY: Social History   Socioeconomic History  . Marital status: Married    Spouse name: Psychologist, clinical  . Number of children: 2  . Years of education: 59  . Highest education level: Not on file  Occupational History  . Occupation: physician office  Tobacco Use  . Smoking status: Never Smoker  . Smokeless tobacco: Never Used  Vaping Use  . Vaping Use: Never used  Substance and Sexual Activity  . Alcohol use: Not Currently    Comment: maybe one a year  . Drug use: Never  . Sexual activity: Yes    Birth control/protection: Surgical  Other Topics Concern  . Not on file  Social History Narrative   ** Merged History Encounter **       Lives with husband Husband on disability Children grown and moved worries about family illness- mother and sister Caffeine  use:     Social Determinants of Corporate investment banker Strain:   . Difficulty of Paying Living Expenses:   Food Insecurity:   . Worried About Programme researcher, broadcasting/film/video in the Last Year:   . Barista in the Last Year:   Transportation Needs:   . Freight forwarder (Medical):   Marland Kitchen Lack of Transportation (Non-Medical):   Physical Activity:   . Days of Exercise per Week:   . Minutes of Exercise per Session:   Stress:   . Feeling of Stress :   Social Connections:   . Frequency of Communication with Friends and Family:   . Frequency of Social Gatherings with Friends and Family:   . Attends Religious Services:   . Active Member of Clubs or Organizations:   . Attends Banker Meetings:   Marland Kitchen Marital Status:   Intimate Partner Violence:   . Fear of Current or Ex-Partner:   . Emotionally Abused:   Marland Kitchen Physically Abused:   . Sexually Abused:     FAMILY HISTORY: Family History  Problem Relation Age of Onset  . Hypertension Mother   . Autoimmune disease Mother         lichen planus  . Eczema Mother   . Parkinson's disease Mother   . Mental illness Mother        bipolar  . Heart disease Maternal Aunt   . Lung cancer Paternal Aunt   . Breast cancer Paternal Aunt        dx in his 5s  . Parkinsonism Paternal Aunt   . Lung cancer Paternal Grandfather   . Colon cancer Paternal Uncle        dx in his 9s  . Colon cancer Paternal Uncle        dx in his 42s  . Melanoma Father 44  . Psoriasis Father   . Hypertension Father   . Heart disease Father   . Breast cancer Sister        dx in her 30s  . Lupus Sister   . COPD Maternal Uncle   . Skin cancer Maternal Uncle   . Colon cancer Paternal Uncle        dx in his 42s  . Mental illness Son        bipolar  . Polycystic ovary syndrome Other     Review of Systems  All other systems reviewed and are negative.    Vital Sign: -Deferred due to telephone visit  Physical Exam -Deferred due to telephone visit -Patient is alert and oriented in the phone and in no acute distress   LABORATORY DATA:  CBC    Component Value Date/Time   WBC 5.1 05/25/2020 0947   RBC 4.26 05/25/2020 0947   HGB 13.3 05/25/2020 0947   HGB 15.5 08/31/2015 1217   HCT 40.6 05/25/2020 0947   HCT 45.4 08/31/2015 1217   PLT 204 05/25/2020 0947   PLT 253 08/31/2015 1217   MCV 95.3 05/25/2020 0947   MCV 95.0 08/31/2015 1217   MCH 31.2 05/25/2020 0947   MCHC 32.8 05/25/2020 0947   RDW 12.5 05/25/2020 0947   RDW 13.5 08/31/2015 1217   LYMPHSABS 1.2 05/25/2020 0947   LYMPHSABS 2.3 08/31/2015 1217   MONOABS 0.5 05/25/2020 0947   MONOABS 0.5 08/31/2015 1217   EOSABS 0.2 05/25/2020 0947   EOSABS 0.1 08/31/2015 1217   BASOSABS 0.0 05/25/2020 0947   BASOSABS 0.0 08/31/2015 1217    CMP  Component Value Date/Time   NA 139 05/25/2020 0947   NA 138 08/31/2015 1217   K 3.6 05/25/2020 0947   K 2.6 (LL) 08/31/2015 1217   CL 107 05/25/2020 0947   CO2 21 (L) 05/25/2020 0947   CO2 27 08/31/2015 1217   GLUCOSE 115 (H)  05/25/2020 0947   GLUCOSE 114 08/31/2015 1217   BUN 19 05/25/2020 0947   BUN 10.3 08/31/2015 1217   CREATININE 0.89 05/25/2020 0947   CREATININE 0.8 08/31/2015 1217   CALCIUM 9.4 05/25/2020 0947   CALCIUM 9.7 08/31/2015 1217   PROT 7.5 05/25/2020 0947   PROT 7.8 08/31/2015 1217   ALBUMIN 4.1 05/25/2020 0947   ALBUMIN 3.9 08/31/2015 1217   AST 27 05/25/2020 0947   AST 24 08/31/2015 1217   ALT 48 (H) 05/25/2020 0947   ALT 37 08/31/2015 1217   ALKPHOS 62 05/25/2020 0947   ALKPHOS 70 08/31/2015 1217   BILITOT 0.4 05/25/2020 0947   BILITOT 0.64 08/31/2015 1217   GFRNONAA >60 05/25/2020 0947   GFRAA >60 05/25/2020 0947   All questions were answered to patient's stated satisfaction. Encouraged patient to call with any new concerns or questions before his next visit to the cancer center and we can certain see him sooner, if needed.     ASSESSMENT and THERAPY PLAN:   Breast cancer of lower-inner quadrant of right female breast ([place]) 1.  Right breast high-grade DCIS: -Status post right lumpectomy and sentinel lymph node biopsy on 10/03/2015 by Dr. Brantley Stage.  0.4 cm high-grade DCIS, 0/1 lymph nodes positive, ER/PR negative. -She underwent radiation therapy in the adjuvant setting. -On 05/01/2017 she underwent left breast biopsy upper outer quadrant-fibroadenoma.  Left breast upper inner quadrant biopsy showed fibrocystic change. -Today's physical examination did not show any palpable masses.  Stable lumpectomy scar in the inferior margin of the areola. -Last mammogram on 04/05/2020 was BI-RADS Category 2 benign. -Genetic testing was negative. -Labs done on 05/25/2020 were WNL. -She will follow-up after her mammogram in June.  2.  Elevated free light chain ratio: -She had elevated free kappa light chains with elevated ratio. -Blood work on 05/25/2019 showed SPEP negative.  Free light chain ratio was elevated at 2.19.  Kappa light chains 36.6 -Blood work on 05/25/2020 showed SPEP negative.  Free  light chain ratio was 1.77.  Kappa light chains 33.7 -Hemoglobin, creatinine and calcium were normal. -We will repeat these labs at her next visit.  3.  Left kidney clear-cell renal cell carcinoma: -Status post partial nephrectomy on 06/19/2018, RCC, clear cell type, grade 2, 1.5 cm, pT1a, negative margins. -Bone scan on 08/07/2018 was negative. -She will follow-up with her urologist.   Orders Placed This Encounter  Procedures  . CBC with Differential/Platelet    Standing Status:   Future    Standing Expiration Date:   06/08/2021  . Comprehensive metabolic panel    Standing Status:   Future    Standing Expiration Date:   06/08/2021  . Ferritin    Standing Status:   Future    Standing Expiration Date:   06/08/2021  . Iron and TIBC    Standing Status:   Future    Standing Expiration Date:   06/08/2021  . Lactate dehydrogenase    Standing Status:   Future    Standing Expiration Date:   06/08/2021  . Protein electrophoresis, serum    Standing Status:   Future    Standing Expiration Date:   06/08/2021  . Kappa/lambda light chains  Standing Status:   Future    Standing Expiration Date:   06/08/2021  . IgG, IgA, IgM    Standing Status:   Future    Standing Expiration Date:   06/08/2021  . VITAMIN D 25 Hydroxy (Vit-D Deficiency, Fractures)    Standing Status:   Future    Standing Expiration Date:   06/08/2021  . Vitamin B12    Standing Status:   Future    Standing Expiration Date:   06/08/2021    All questions were answered. The patient knows to call the clinic with any problems, questions or concerns. We can certainly see the patient much sooner if necessary. This note was electronically signed.  I provided 29 minutes of non face-to-face telephone visit time during this encounter, and > 50% was spent counseling as documented under my assessment & plan.   Glennie Isle, NP-C 06/08/2020

## 2020-06-16 ENCOUNTER — Ambulatory Visit: Payer: 59 | Admitting: Neurology

## 2020-06-27 ENCOUNTER — Other Ambulatory Visit: Payer: Self-pay

## 2020-06-27 ENCOUNTER — Ambulatory Visit
Admission: EM | Admit: 2020-06-27 | Discharge: 2020-06-27 | Disposition: A | Discharge status: Home / Self Care | Payer: 59 | Attending: Emergency Medicine | Admitting: Emergency Medicine

## 2020-06-27 DIAGNOSIS — Z1152 Encounter for screening for COVID-19: Secondary | ICD-10-CM | POA: Diagnosis not present

## 2020-06-27 DIAGNOSIS — J0100 Acute maxillary sinusitis, unspecified: Secondary | ICD-10-CM | POA: Diagnosis not present

## 2020-06-27 MED ORDER — PREDNISONE 10 MG PO TABS
20.0000 mg | ORAL_TABLET | Freq: Every day | ORAL | 0 refills | Status: DC
Start: 2020-06-27 — End: 2020-09-29

## 2020-06-27 MED ORDER — AMOXICILLIN-POT CLAVULANATE 875-125 MG PO TABS
1.0000 | ORAL_TABLET | Freq: Two times a day (BID) | ORAL | 0 refills | Status: DC
Start: 2020-06-27 — End: 2020-09-29

## 2020-06-27 NOTE — ED Triage Notes (Signed)
Pt presents with nasal congestion that began 3 days ago

## 2020-06-27 NOTE — Discharge Instructions (Addendum)
COVID testing ordered.  It will take between 2-7 days for test results.  Someone will contact you regarding abnormal results.    In the meantime: You should remain isolated in your home for 10 days from symptom onset AND greater than 24 hours after symptoms resolution (absence of fever without the use of fever-reducing medication and improvement in respiratory symptoms), whichever is longer Get plenty of rest and push fluids Prednisone was prescribed Augmentin prescribed as directed Flonase for nasal congestion and runny nose Use medications daily for symptom relief Use OTC medications like ibuprofen or tylenol as needed fever or pain Call or go to the ED if you have any new or worsening symptoms such as fever, worsening cough, shortness of breath, chest tightness, chest pain, turning blue, changes in mental status, etc..Marland Kitchen

## 2020-06-27 NOTE — ED Provider Notes (Signed)
[place]   [id number] 06/27/20 Arrival Time: 2595   CC: COVID symptoms  SUBJECTIVE: History from: patient.  Sarah Cooley is a 54 y.o. female who presents to the urgent care for complaint of sinus pressure, sinus pain, nasal congestion and cough for the past 3 days.  Report brown nasal discharge.  Denies sick exposure to COVID, flu or strep.  Denies recent travel.  Has tried OTC medication without relief.  Denies aggravating factors.  Denies previous symptoms in the past.   Denies fever, chills, fatigue, sinus pain, rhinorrhea, sore throat, SOB, wheezing, chest pain, nausea, changes in bowel or bladder habits.     ROS: As per HPI.  All other pertinent ROS negative.      Past Medical History:  Diagnosis Date  . Allergy    seasonal  . Anxiety   . Arthritis   . Balance problem   . Brain aneurysm    2 mm ACA region aneurysm by 09/21/15 MRA  . Breast cancer ([place])   . Breast cancer of lower-inner quadrant of right female breast ([place]) 08/26/2015  . Chronic kidney disease    acute renal failure one occasion  . Chronic low back pain 12/02/2018  . Complication of anesthesia    heart rate drops and O2 sats drop  . Constipation   . Depression   . Diplopia 10/29/2017  . Dizziness 10/29/2017  . Elevated liver function tests   . Family history of adverse reaction to anesthesia    mom has n/v  . Family history of breast cancer   . Family history of colon cancer   . Fibromyalgia   . Fibromyalgia   . Gait abnormality 10/29/2017  . Genetic testing 09/08/2015   Negative genetic testing on the Breast/High Moderate Risk panel. The Breast High/Moderate Risk gene panel offered by [facility] includes sequencing and deletion/duplication analysis of the following 9 genes: ATM, BRCA1, BRCA2, CDH1, CHEK2, PALB2, PTEN, STK11, and TP53. The report date is September 08, 2015.  The Rest of the Comprehensive Cancer panel has been reflexed and will be available in 2-3 weeks.  POLD1 c.208G>T VUS found  on the Remainder of the Comprehensive cancer panel.  The Comprehensive Cancer Panel offered by [facility] includes sequencing and/or deletion duplication testing of the following 32 genes: APC, ATM, AXIN2, BARD1, BMPR1A, BRCA1, BRCA2, BRIP1, CDH1, CDK4, CDKN2A, CHEK2, EPCAM, FANCC, MLH1, MSH2, MSH6, MUTYH, NBN, PALB2, PMS2, POLD1, POLE, PTEN, RAD51C, RAD51D, SCG5/GREM1, SMAD4, STK11, TP53, VHL, and XRCC2.   The report date is 09/12/2015.   Marland Kitchen GERD (gastroesophageal reflux disease)    none recent  . H/O acute renal failure 09/22/15   admitted to [facility]  . Headache    occipital and temporal  . History of hiatal hernia   . Hyperlipemia   . Hyperlipidemia   . Hypertension   . Irritable bowel syndrome (IBS)   . Lyme disease   . MGUS (monoclonal gammopathy of unknown significance) 03/15/2016  . Neuromuscular disorder (HCC)    fibromyalgia  . Occasional tremors   . OSA (obstructive sleep apnea)    uses cpap with humidification- auto   . Personal history of radiation therapy   . PONV (postoperative nausea and vomiting)   . Renal cell carcinoma ([place])   . Tremor, essential 10/29/2017   Past Surgical History:  Procedure Laterality Date  . ABDOMINAL HYSTERECTOMY     adenomyosis  . ABDOMINAL HYSTERECTOMY    . BLADDER SURGERY    . BONE GRAFT HIP ILIAC  CREST    . BREAST BIOPSY Bilateral   . BREAST LUMPECTOMY    . BREAST SURGERY    . CHOLECYSTECTOMY    . COLONOSCOPY WITH PROPOFOL  05/14/2016   [facility]; entire examined colon normal.  Recommendations to repeat colonoscopy in 10 years for screening purposes.  . ESOPHAGOGASTRODUODENOSCOPY (EGD) WITH PROPOFOL  03/24/2018   [facility]; normal esophagus, erythematous mucosa in the gastric body and antrum s/p biopsy, normal examined duodenum s/p biopsy.  Pathology with benign duodenal biopsy.  Stomach biopsies with chronic inactive gastritis, negative for H. pylori.  . FRACTURE SURGERY Right    wrist  . KNEE ARTHROSCOPY    . KNEE ARTHROSCOPY W/  ACL RECONSTRUCTION     right  . RADIOACTIVE SEED GUIDED PARTIAL MASTECTOMY WITH AXILLARY SENTINEL LYMPH NODE BIOPSY Right 10/13/2015   Procedure: RADIOACTIVE SEED GUIDED PARTIAL MASTECTOMY WITH AXILLARY SENTINEL LYMPH NODE BIOPSY;  Surgeon: Erroll Luna, MD;  Location: [place];  Service: General;  Laterality: Right;  . right carpal and cubital tunnel release     . ROBOTIC ASSITED PARTIAL NEPHRECTOMY Left 06/19/2018   Procedure: XI ROBOTIC ASSITED PARTIAL NEPHRECTOMY;  Surgeon: Ardis Hughs, MD;  Location: [facility];  Service: Urology;  Laterality: Left;  . WRIST SURGERY     Allergies  Allergen Reactions  . Ramipril Nausea And Vomiting and Other (See Comments)    Ramipril--Caused Pancreatitis  . Minocycline Hcl Hives  . Other Rash    ADHESIVE GLUE USED FOR INCISIONS  Anti-inflammatory medicine - pt stated, "Upsets my GI tract" Please use paper tape Please use paper tape Please use paper tape Patient is allergic to many adhesives   . Altace [Ramipril] Nausea And Vomiting and Nausea Only    Drug induced pancreatitis   . Lyrica [Pregabalin] Nausea And Vomiting  . Adhesive [Tape] Rash    Please use paper tape Patient is allergic to many adhesives   . Bactrim [Sulfamethoxazole-Trimethoprim] Other (See Comments)    May have caused kidney issues  . Drixoral [Brompheniramine-Pseudoeph] Rash  . Erythromycin Other (See Comments)    GI- Upset / severe abdominal pain  . Minocin [Minocycline Hcl] Nausea And Vomiting and Rash  . Septra [Sulfamethoxazole-Trimethoprim] Rash  . Sinutab [Chlorphen-Pseudoephed-Apap] Rash   No current facility-administered medications on file prior to encounter.   Current Outpatient Medications on File Prior to Encounter  Medication Sig Dispense Refill  . clonazePAM (KLONOPIN) 0.5 MG tablet Take 0.5 mg by mouth daily as needed.    . cyclobenzaprine (FLEXERIL) 10 MG tablet Take 10 mg by mouth 3 (three) times daily.    Marland Kitchen diltiazem (CARDIZEM CD) 180 MG 24 hr  capsule Take 1 capsule (180 mg total) by mouth daily. 90 capsule 1  . FLUoxetine (PROZAC) 20 MG capsule Take 60 mg by mouth daily.  12  . furosemide (LASIX) 20 MG tablet Take 20 mg by mouth daily. PRN    . HYDROcodone-acetaminophen (NORCO) 7.5-325 MG tablet Take 1 tablet by mouth 3 (three) times daily as needed. for pain    . losartan (COZAAR) 25 MG tablet Take 25 mg by mouth daily.  0  . meloxicam (MOBIC) 7.5 MG tablet Take 7.5 mg by mouth 2 (two) times daily.    Marland Kitchen OVER THE COUNTER MEDICATION Take 1 capsule by mouth daily as needed. Diphenhydramate Over The Counter Anti-emetic    . pantoprazole (PROTONIX) 40 MG tablet TAKE ONE TABLET BY MOUTH EVERY MORNING PRIOR TO BREAKFAST 30 tablet 5  . pramipexole (MIRAPEX) 0.125  MG tablet TAKE 1 TABLET BY MOUTH DAILY AT DINNERTIME AND TAKE 2 TABLETS BY MOUTH AT BEDTIME 90 tablet 5  . pravastatin (PRAVACHOL) 10 MG tablet Take 10 mg by mouth daily.    Marland Kitchen topiramate (TOPAMAX) 50 MG tablet Take 2 in the morning, take 3 at night 440 tablet 1  . Vitamin D, Ergocalciferol, (DRISDOL) 50000 units CAPS capsule Take 50,000 Units by mouth every 7 (seven) days. Take 1 tablet (50,000 units) by mouth every Thursday     Social History   Socioeconomic History  . Marital status: Married    Spouse name: Psychologist, clinical  . Number of children: 2  . Years of education: 78  . Highest education level: Not on file  Occupational History  . Occupation: physician office  Tobacco Use  . Smoking status: Never Smoker  . Smokeless tobacco: Never Used  Vaping Use  . Vaping Use: Never used  Substance and Sexual Activity  . Alcohol use: Not Currently    Comment: maybe one a year  . Drug use: Never  . Sexual activity: Yes    Birth control/protection: Surgical  Other Topics Concern  . Not on file  Social History Narrative   ** Merged History Encounter **       Lives with husband Husband on disability Children grown and moved worries about family illness- mother and sister Caffeine  use:     Social Determinants of Health   Financial Resource Strain:   . Difficulty of Paying Living Expenses: Not on file  Food Insecurity:   . Worried About Charity fundraiser in the Last Year: Not on file  . Ran Out of Food in the Last Year: Not on file  Transportation Needs:   . Lack of Transportation (Medical): Not on file  . Lack of Transportation (Non-Medical): Not on file  Physical Activity:   . Days of Exercise per Week: Not on file  . Minutes of Exercise per Session: Not on file  Stress:   . Feeling of Stress : Not on file  Social Connections:   . Frequency of Communication with Friends and Family: Not on file  . Frequency of Social Gatherings with Friends and Family: Not on file  . Attends Religious Services: Not on file  . Active Member of Clubs or Organizations: Not on file  . Attends Archivist Meetings: Not on file  . Marital Status: Not on file  Intimate Partner Violence:   . Fear of Current or Ex-Partner: Not on file  . Emotionally Abused: Not on file  . Physically Abused: Not on file  . Sexually Abused: Not on file   Family History  Problem Relation Age of Onset  . Hypertension Mother   . Autoimmune disease Mother        lichen planus  . Eczema Mother   . Parkinson's disease Mother   . Mental illness Mother        bipolar  . Heart disease Maternal Aunt   . Lung cancer Paternal Aunt   . Breast cancer Paternal Aunt        dx in his 50s  . Parkinsonism Paternal Aunt   . Lung cancer Paternal Grandfather   . Colon cancer Paternal Uncle        dx in his 27s  . Colon cancer Paternal Uncle        dx in his 88s  . Melanoma Father 72  . Psoriasis Father   . Hypertension Father   .  Heart disease Father   . Breast cancer Sister        dx in her 28s  . Lupus Sister   . COPD Maternal Uncle   . Skin cancer Maternal Uncle   . Colon cancer Paternal Uncle        dx in his 13s  . Mental illness Son        bipolar  . Polycystic ovary syndrome  Other     OBJECTIVE:  Vitals:   06/27/20 1325  BP: (!) 130/91  Pulse: 91  Resp: 20  Temp: 98.8 F (37.1 C)  SpO2: 95%     General appearance: alert; appears fatigued, but nontoxic; speaking in full sentences and tolerating own secretions HEENT: NCAT; Ears: EACs clear, TMs pearly gray; Eyes: PERRL.  EOM grossly intact. Sinuses: nontender; Nose: nares patent without rhinorrhea, Throat: oropharynx clear, tonsils non erythematous or enlarged, uvula midline  Neck: supple without LAD Lungs: unlabored respirations, symmetrical air entry; cough: mild; no respiratory distress; CTAB Heart: regular rate and rhythm.  Radial pulses 2+ symmetrical bilaterally Skin: warm and dry Psychological: alert and cooperative; normal mood and affect  LABS:  No results found for this or any previous visit (from the past 24 hour(s)).   ASSESSMENT & PLAN:  1. Acute non-recurrent maxillary sinusitis   2. Encounter for screening for COVID-19     Meds ordered this encounter  Medications  . amoxicillin-clavulanate (AUGMENTIN) 875-125 MG tablet    Sig: Take 1 tablet by mouth every 12 (twelve) hours.    Dispense:  14 tablet    Refill:  0  . predniSONE (DELTASONE) 10 MG tablet    Sig: Take 2 tablets (20 mg total) by mouth daily.    Dispense:  15 tablet    Refill:  0    Discharge instructions  COVID testing ordered.  It will take between 2-7 days for test results.  Someone will contact you regarding abnormal results.    In the meantime: You should remain isolated in your home for 10 days from symptom onset AND greater than 24 hours after symptoms resolution (absence of fever without the use of fever-reducing medication and improvement in respiratory symptoms), whichever is longer Get plenty of rest and push fluids Prednisone was prescribed Augmentin prescribed as directed Flonase for nasal congestion and runny nose Use medications daily for symptom relief Use OTC medications like ibuprofen or  tylenol as needed fever or pain Call or go to the ED if you have any new or worsening symptoms such as fever, worsening cough, shortness of breath, chest tightness, chest pain, turning blue, changes in mental status, etc...   Reviewed expectations re: course of current medical issues. Questions answered. Outlined signs and symptoms indicating need for more acute intervention. Patient verbalized understanding. After Visit Summary given.      Note: This document was prepared using Dragon voice recognition software and may include unintentional dictation errors.    Emerson Monte, FNP 06/27/20 1420

## 2020-06-29 ENCOUNTER — Ambulatory Visit: Payer: 59 | Admitting: Neurology

## 2020-06-30 LAB — NOVEL CORONAVIRUS, NAA: SARS-CoV-2, NAA: NOT DETECTED

## 2020-07-11 ENCOUNTER — Other Ambulatory Visit: Payer: Self-pay | Admitting: Internal Medicine

## 2020-07-11 ENCOUNTER — Other Ambulatory Visit (HOSPITAL_COMMUNITY): Payer: Self-pay | Admitting: Internal Medicine

## 2020-07-11 DIAGNOSIS — G459 Transient cerebral ischemic attack, unspecified: Secondary | ICD-10-CM

## 2020-07-20 ENCOUNTER — Other Ambulatory Visit: Payer: Self-pay

## 2020-07-20 ENCOUNTER — Ambulatory Visit (HOSPITAL_COMMUNITY)
Admission: RE | Admit: 2020-07-20 | Discharge: 2020-07-20 | Disposition: A | Discharge status: Home / Self Care | Payer: 59 | Source: Ambulatory Visit | Attending: Internal Medicine | Admitting: Internal Medicine

## 2020-07-20 DIAGNOSIS — G459 Transient cerebral ischemic attack, unspecified: Secondary | ICD-10-CM | POA: Diagnosis not present

## 2020-07-24 ENCOUNTER — Other Ambulatory Visit: Payer: Self-pay | Admitting: Neurology

## 2020-07-27 ENCOUNTER — Telehealth: Payer: Self-pay | Admitting: Neurology

## 2020-07-27 NOTE — Telephone Encounter (Signed)
Records faxed to [facility] at [phone] on 07/27/2020.

## 2020-08-23 ENCOUNTER — Other Ambulatory Visit: Payer: Self-pay | Admitting: Neurology

## 2020-09-28 ENCOUNTER — Encounter: Payer: Self-pay | Admitting: Gastroenterology

## 2020-09-28 NOTE — Progress Notes (Signed)
Referring Provider: Celene Squibb, MD Primary Care Physician:  Celene Squibb, MD Primary GI Physician: Dr. Abbey Chatters  Chief Complaint  Patient presents with  . Gastroesophageal Reflux    Doing okay  . Diarrhea    Happened after eating at seafood bar beginning of November. Saw PCP, placed on flagyl for 7 days. It got better afterward but then 1 week later had 1 episode of watery diarrhea. Now having constipation- hard balls coming out. But then yesterday had a "pure liquid shot out" walking through the house, then now constipated. Has had a lot of things going on recently. Lost mom 10/22. Patient had TIA after this. Trouble sleeping at night   . Anorexia  . Weight Loss    Per pt not intended    HPI:   Sarah Cooley is a 54 y.o. female presenting today for follow-up of GERD and intermittent diarrhea. History of  IBS with alternating constipation and diarrhea, C. Diff in 2019 treated with oral vancomycin with resolution of diarrhea, hiatal hernia, and GERD. Colonoscopy 05/14/2016 with [facility] with normal exam.  Recommended repeat colonoscopy in 2027.  EGD 03/24/2018 with [facility] for epigastric pain, GERD, bloating.  Impression with normal esophagus, erythematous mucosa in the gastric body s/p biopsied, erythematous mucosa in the antrum biopsied,normal examined duodenum biopsied.  Duodenal biopsies benign, negative for celiac disease.  Gastric biopsies with chronic active gastritis, negative for H. Pylori.   Last seen in our office 03/28/2020.  Reported recently having a "stomach bug" which she and her husband have the same symptoms.  Had also been treated with Augmentin in May which caused diarrhea.  She had been on antibiotics a total of 3 times throughout the year and was concerned about recurrent C. difficile.  2 days prior to office visit, she also had watery diarrhea out of the blue.  GERD was well controlled on Protonix.  Felt the food items were going down the wrong way 1-2 times a week which  was progressive.  She was referred to [facility]LP for MBSS due to possible aspiration, continue Protonix 40 mg daily, monitor for recurrent persistent diarrhea.  Follow-up in 6 months.  She saw speech therapy 04/11/2020.  Modified barium swallow was completed.  Normal oropharyngeal swallow.  Swallow trigger with liquids is after spilling from the vallecula to the piriformis.  Trace/minimal lingual residuals after primary swallow but spontaneously cleared with second swallow.  No penetration or aspiration.  Slight prominence of cricopharyngeus which was also noted on previous MBSS in 2019.  Esophageal sweep revealed standing column of barium in distal esophagus with some retrograde movement to the level of the lower cervical esophagus but did not backflow to pharynx.  Recommended patient adhering to reflux precautions (small bites/sips, swallow x2 for each bite/sip, eat slowly, sit upright for all eating and drinking and remain upright after, HOB elevated).  Today:   Had been doing well with 1 BM daily, occasionally requiring a stool softener if her stools were on the harder side.  Had not been having any trouble with diarrhea.  The first weekend in November after eating at a seafood buffet (muscles, oysters, and clam strips), she developed acute onset explosive watery diarrhea.  Took imodium without improvement. Saw PCP who started her on flagyl x7 days and probiotic daily. Still taking probiotic. Diarrhea resolved after 5 days. Lost her appetite, mild lower abdominal pain, and constipation since being on flagyl. Can walk through the house and have incontinent episode with "  a squirt of diarrhea".  Currently, she is having a BM once a day, passing small hard balls of stools. Not eating much. Doesn't have an appetite. Associated weight loss. Documented weight loss of 12 lbs over 6 months. Patient states it was within the last month. No nausea or vomiting.  Feels full after a few bites.   Abdominal pain across lower  abdomen. Various time of the day. Not affected by meals or bowel movements.  States this pain is similar to the pain she has had with constipation in the past.    Low-volume toilet tissue hematochezia with diarrhea.  States she is having rectal burning due to wiping so much.  No known hemorrhoids. No rectal bleeding since diarrhea stopped.   GERD is well controlled. No trouble swallowing.   On xanax because not sleeping at night.   Lost friend Arsenio Katz, then lost Jyl Heinz, husband ended up in ER in September and had to have emergency surgery, then lost her mother in October, and lost her cousin a couple weeks ago. She had TIA in late September while talking on phone with sister.   Past Medical History:  Diagnosis Date  . Allergy    seasonal  . Anxiety   . Arthritis   . Balance problem   . Brain aneurysm    2 mm ACA region aneurysm by 09/21/15 MRA  . Breast cancer ([place])   . Breast cancer of lower-inner quadrant of right female breast ([place]) 08/26/2015  . Chronic kidney disease    acute renal failure one occasion  . Chronic low back pain 12/02/2018  . Complication of anesthesia    heart rate drops and O2 sats drop  . Constipation   . Depression   . Diplopia 10/29/2017  . Dizziness 10/29/2017  . Elevated liver function tests   . Family history of adverse reaction to anesthesia    mom has n/v  . Family history of breast cancer   . Family history of colon cancer   . Fibromyalgia   . Fibromyalgia   . Gait abnormality 10/29/2017  . Genetic testing 09/08/2015   Negative genetic testing on the Breast/High Moderate Risk panel. The Breast High/Moderate Risk gene panel offered by [facility] includes sequencing and deletion/duplication analysis of the following 9 genes: ATM, BRCA1, BRCA2, CDH1, CHEK2, PALB2, PTEN, STK11, and TP53. The report date is September 08, 2015.  The Rest of the Comprehensive Cancer panel has been reflexed and will be available in 2-3 weeks.  POLD1 c.208G>T VUS found on the  Remainder of the Comprehensive cancer panel.  The Comprehensive Cancer Panel offered by [facility] includes sequencing and/or deletion duplication testing of the following 32 genes: APC, ATM, AXIN2, BARD1, BMPR1A, BRCA1, BRCA2, BRIP1, CDH1, CDK4, CDKN2A, CHEK2, EPCAM, FANCC, MLH1, MSH2, MSH6, MUTYH, NBN, PALB2, PMS2, POLD1, POLE, PTEN, RAD51C, RAD51D, SCG5/GREM1, SMAD4, STK11, TP53, VHL, and XRCC2.   The report date is 09/12/2015.   Marland Kitchen GERD (gastroesophageal reflux disease)    none recent  . H/O acute renal failure 09/22/15   admitted to [facility]  . Headache    occipital and temporal  . History of hiatal hernia   . Hyperlipemia   . Hyperlipidemia   . Hypertension   . Irritable bowel syndrome (IBS)   . Lyme disease   . MGUS (monoclonal gammopathy of unknown significance) 03/15/2016  . Neuromuscular disorder (HCC)    fibromyalgia  . Nocturnal myoclonus   . Occasional tremors   . OSA (obstructive sleep apnea)  uses cpap with humidification- auto   . Personal history of radiation therapy   . PONV (postoperative nausea and vomiting)   . Renal cell carcinoma ([place])   . TIA (transient ischemic attack) 2021  . Tremor, essential 10/29/2017    Past Surgical History:  Procedure Laterality Date  . ABDOMINAL HYSTERECTOMY     adenomyosis  . ABDOMINAL HYSTERECTOMY    . BLADDER SURGERY    . BONE GRAFT HIP ILIAC CREST    . BREAST BIOPSY Bilateral   . BREAST LUMPECTOMY    . BREAST SURGERY    . CHOLECYSTECTOMY    . COLONOSCOPY WITH PROPOFOL  05/14/2016   [facility]; entire examined colon normal.  Recommendations to repeat colonoscopy in 10 years for screening purposes.  . ESOPHAGOGASTRODUODENOSCOPY (EGD) WITH PROPOFOL  03/24/2018   [facility]; normal esophagus, erythematous mucosa in the gastric body and antrum s/p biopsy, normal examined duodenum s/p biopsy.  Pathology with benign duodenal biopsy.  Stomach biopsies with chronic inactive gastritis, negative for H. pylori.  . FRACTURE SURGERY  Right    wrist  . KNEE ARTHROSCOPY    . KNEE ARTHROSCOPY W/ ACL RECONSTRUCTION     right  . RADIOACTIVE SEED GUIDED PARTIAL MASTECTOMY WITH AXILLARY SENTINEL LYMPH NODE BIOPSY Right 10/13/2015   Procedure: RADIOACTIVE SEED GUIDED PARTIAL MASTECTOMY WITH AXILLARY SENTINEL LYMPH NODE BIOPSY;  Surgeon: Erroll Luna, MD;  Location: [place];  Service: General;  Laterality: Right;  . right carpal and cubital tunnel release     . ROBOTIC ASSITED PARTIAL NEPHRECTOMY Left 06/19/2018   Procedure: XI ROBOTIC ASSITED PARTIAL NEPHRECTOMY;  Surgeon: Ardis Hughs, MD;  Location: [facility];  Service: Urology;  Laterality: Left;  . WRIST SURGERY      Current Outpatient Medications  Medication Sig Dispense Refill  . ALPRAZolam (XANAX) 1 MG tablet Take 1 mg by mouth at bedtime.    Marland Kitchen diltiazem (CARDIZEM CD) 180 MG 24 hr capsule Take 1 capsule (180 mg total) by mouth daily. 90 capsule 1  . FLUoxetine (PROZAC) 20 MG capsule Take 60 mg by mouth daily.  12  . furosemide (LASIX) 20 MG tablet Take 20 mg by mouth daily. PRN    . HYDROcodone-acetaminophen (NORCO) 7.5-325 MG tablet Take 0.5 tablets by mouth at bedtime. Or TID PRN    . losartan (COZAAR) 25 MG tablet Take 25 mg by mouth daily.  0  . meloxicam (MOBIC) 7.5 MG tablet Take 7.5 mg by mouth 2 (two) times daily.    Marland Kitchen OVER THE COUNTER MEDICATION Take 1 capsule by mouth daily as needed. Diphenhydramate Over The Counter Anti-emetic    . pantoprazole (PROTONIX) 40 MG tablet TAKE ONE TABLET BY MOUTH EVERY MORNING PRIOR TO BREAKFAST 30 tablet 5  . pramipexole (MIRAPEX) 0.125 MG tablet TAKE ONE TABLET BY MOUTH DAILY AT DINNERTIME AND TAKE TWO TABLETS BY MOUTH AT BEDTIME 90 tablet 5  . pravastatin (PRAVACHOL) 10 MG tablet Take 10 mg by mouth daily.    Marland Kitchen topiramate (TOPAMAX) 50 MG tablet Take 2 in the morning, take 3 at night 440 tablet 1  . Vitamin D, Ergocalciferol, (DRISDOL) 50000 units CAPS capsule Take 50,000 Units by mouth every 7 (seven) days. Take 1 tablet  (50,000 units) by mouth every Thursday    . Prucalopride Succinate (MOTEGRITY) 2 MG TABS Take 1 tablet (2 mg total) by mouth daily. 30 tablet 1   No current facility-administered medications for this visit.    Allergies as of 09/29/2020 - Review Complete  09/29/2020  Allergen Reaction Noted  . Ramipril Nausea And Vomiting and Other (See Comments) 02/12/2012  . Minocycline hcl Hives 02/12/2012  . Other Rash 10/11/2015  . Altace [ramipril] Nausea And Vomiting and Nausea Only 01/18/2018  . Lyrica [pregabalin] Nausea And Vomiting 01/18/2018  . Adhesive [tape] Rash 10/11/2015  . Bactrim [sulfamethoxazole-trimethoprim] Other (See Comments) 10/11/2015  . Drixoral [brompheniramine-pseudoeph] Rash 02/12/2012  . Erythromycin Other (See Comments) 02/12/2012  . Minocin [minocycline hcl] Nausea And Vomiting and Rash 01/18/2018  . Septra [sulfamethoxazole-trimethoprim] Rash 01/18/2018  . Sinutab [chlorphen-pseudoephed-apap] Rash 02/12/2012    Family History  Problem Relation Age of Onset  . Hypertension Mother   . Autoimmune disease Mother        lichen planus  . Eczema Mother   . Parkinson's disease Mother   . Mental illness Mother        bipolar  . Heart disease Maternal Aunt   . Lung cancer Paternal Aunt   . Breast cancer Paternal Aunt        dx in his 79s  . Parkinsonism Paternal Aunt   . Lung cancer Paternal Grandfather   . Colon cancer Paternal Uncle        dx in his 38s  . Colon cancer Paternal Uncle        dx in his 85s  . Melanoma Father 64  . Psoriasis Father   . Hypertension Father   . Heart disease Father   . Breast cancer Sister        dx in her 67s  . Lupus Sister   . COPD Maternal Uncle   . Skin cancer Maternal Uncle   . Colon cancer Paternal Uncle        dx in his 62s  . Mental illness Son        bipolar  . Polycystic ovary syndrome Other     Social History   Socioeconomic History  . Marital status: Married    Spouse name: Psychologist, clinical  . Number of children: 2   . Years of education: 51  . Highest education level: Not on file  Occupational History  . Occupation: physician office  Tobacco Use  . Smoking status: Never Smoker  . Smokeless tobacco: Never Used  Vaping Use  . Vaping Use: Never used  Substance and Sexual Activity  . Alcohol use: Not Currently    Comment: maybe one a year  . Drug use: Never  . Sexual activity: Yes    Birth control/protection: Surgical  Other Topics Concern  . Not on file  Social History Narrative   ** Merged History Encounter **       Lives with husband Husband on disability Children grown and moved worries about family illness- mother and sister Caffeine use:     Social Determinants of Health   Financial Resource Strain: Not on file  Food Insecurity: Not on file  Transportation Needs: Not on file  Physical Activity: Not on file  Stress: Not on file  Social Connections: Not on file    Review of Systems: Gen: Denies fever, chills, cold or flulike symptoms, presyncope, syncope. CV: Admits to intermittent pain in her right chest which she has been told is secondary to prior radiation for breast cancer.  Denies palpitations. Resp: Denies dyspnea or cough. GI: See HPI Heme: See HPI  Physical Exam: BP 129/88   Pulse 87   Temp (!) 97.2 F (36.2 C)   Ht 5' 8"  (1.727 m)   Wt 229 lb (103.9  kg)   BMI 34.82 kg/m  General:   Alert and oriented. No distress noted. Pleasant and cooperative.  Head:  Normocephalic and atraumatic. Eyes:  Conjuctiva clear without scleral icterus. Heart:  S1, S2 present without murmurs appreciated. Lungs:  Clear to auscultation bilaterally. No wheezes, rales, or rhonchi. No distress.  Abdomen:  +BS, soft, non-tender and non-distended. No rebound or guarding. No HSM or masses noted. Msk:  Symmetrical without gross deformities. Normal posture. Extremities:  Without edema. Neurologic:  Alert and  oriented x4 Psych: Normal mood and affect.

## 2020-09-29 ENCOUNTER — Encounter: Payer: Self-pay | Admitting: Gastroenterology

## 2020-09-29 ENCOUNTER — Other Ambulatory Visit: Payer: Self-pay

## 2020-09-29 ENCOUNTER — Ambulatory Visit (INDEPENDENT_AMBULATORY_CARE_PROVIDER_SITE_OTHER): Payer: 59 | Admitting: Gastroenterology

## 2020-09-29 VITALS — BP 129/88 | HR 87 | Temp 97.2°F | Ht 68.0 in | Wt 229.0 lb

## 2020-09-29 DIAGNOSIS — K219 Gastro-esophageal reflux disease without esophagitis: Secondary | ICD-10-CM | POA: Diagnosis not present

## 2020-09-29 DIAGNOSIS — K5900 Constipation, unspecified: Secondary | ICD-10-CM

## 2020-09-29 DIAGNOSIS — R630 Anorexia: Secondary | ICD-10-CM | POA: Diagnosis not present

## 2020-09-29 DIAGNOSIS — R634 Abnormal weight loss: Secondary | ICD-10-CM

## 2020-09-29 MED ORDER — MOTEGRITY 2 MG PO TABS: 2.0000 mg | ORAL_TABLET | Freq: Every day | ORAL | 1 refills | Status: DC

## 2020-09-29 NOTE — Patient Instructions (Signed)
I am sending Motegrity 2 mg to your pharmacy.  Please take this medication once daily to help your constipation.  Please let me know if Motegrity is too expensive.  Please try eating at least 4 small meals daily.  Please also add boost or Ensure twice daily to supplement your nutritional intake.  Drink enough water to keep your urine pale yellow to clear.  We will plan to see you back in 6-8 weeks.  Please call me if your constipation is not improving or your symptoms are worsening.  Aliene Altes, PA-C [facility]

## 2020-09-30 NOTE — Assessment & Plan Note (Addendum)
54 year old female with reported history of IBS with alternating constipation and diarrhea.  C. difficile in 2019 treated with vancomycin with resolution of diarrhea.  She had been doing well until first week of November when she developed acute onset watery diarrhea after eating at a seafood buffet.  Associated toilet tissue hematochezia with diarrhea with her bottom feeling "raw". She was treated empirically with Flagyl by her PCP and had resolution of diarrhea and toilet tissue hematochezia.  She has since then struggling with a loss of appetite, mild intermittent lower abdominal discomfort, and constipation.  Occasionally passing " squirts of diarrhea".  Documented 12 pound weight loss over the last 6 months which patient reports has been within the last month in the setting of this acute illness.  Colonoscopy up-to-date in 2017 with [facility] revealing normal exam, due for repeat in 2027.  Abdominal exam is benign.  Suspect patient likely had acute gastroenteritis which she is still recovering from.  Rectal bleeding likely from benign anorectal source. Abdominal pain likely secondary to constipation in the setting of IBS.  Suspect occasional low-volume liquid stools are overflow.  Notably, with loss of appetite and early satiety, she may also be dealing with postinfectious gastroparesis.  In this setting, we will try her on Motegrity to see if this will help with constipation and appetite/early satiety.  Plan: Trial Motegrity 2 mg daily.  Prescription sent to pharmacy. Advised to let us know if Motegrity is too expensive and we can try MiraLAX. Encouraged her to increase her water intake. If symptoms do not improve or if she continues to lose weight, may need to consider endoscopic evaluation. Follow-up in 6-8 weeks. Call if symptoms are not improving or are worsening prior to next visit.

## 2020-09-30 NOTE — Progress Notes (Signed)
Cc'ed to pcp °

## 2020-09-30 NOTE — Assessment & Plan Note (Addendum)
54 year old female documented 12 pound weight loss over the last 6 months which she reports is unintentional.  Notably, she was acutely ill the first week of November with significant watery diarrhea after eating at a seafood buffet.  Treated empirically with Flagyl x7 days by PCP.  Diarrhea resolved but she continues with early satiety, lack of appetite, mild lower abdominal pain, and now with constipation.  Denies nausea or vomiting.  Suspect weight loss is likely secondary to acute illness.  Ongoing early satiety and lack of appetite may be influenced by postinfectious gastroparesis.    Plan:  We will plan to treat supportively for now.  In the setting of constipation and possible postinfectious gastroparesis, we will try Motegrity 2mg  daily.  I have also advised she try eating at least 4 small meals daily, add boost or Ensure twice daily to support nutritional intake, and increase water intake.   Follow-up in 6-8 weeks.  Advise she call if symptoms do not continue to improve or if she has any worsening symptoms in the meantime.  If her symptoms do not improve and she continues to lose weight, we will need to consider endoscopic evaluation.

## 2020-09-30 NOTE — Assessment & Plan Note (Signed)
Addressed under loss of weight 

## 2020-09-30 NOTE — Assessment & Plan Note (Signed)
Well-controlled on Protonix 40 mg daily.  Advise she continue her current medications.

## 2020-10-16 ENCOUNTER — Other Ambulatory Visit: Payer: Self-pay | Admitting: Gastroenterology

## 2020-10-16 DIAGNOSIS — K219 Gastro-esophageal reflux disease without esophagitis: Secondary | ICD-10-CM

## 2020-10-16 DIAGNOSIS — R110 Nausea: Secondary | ICD-10-CM

## 2020-10-19 ENCOUNTER — Other Ambulatory Visit: Payer: Self-pay

## 2020-10-19 DIAGNOSIS — K219 Gastro-esophageal reflux disease without esophagitis: Secondary | ICD-10-CM

## 2020-10-19 DIAGNOSIS — R110 Nausea: Secondary | ICD-10-CM

## 2020-10-20 ENCOUNTER — Ambulatory Visit (HOSPITAL_COMMUNITY)
Admission: RE | Admit: 2020-10-20 | Discharge: 2020-10-20 | Disposition: A | Discharge status: Home / Self Care | Payer: 59 | Source: Ambulatory Visit | Attending: Student | Admitting: Student

## 2020-10-20 ENCOUNTER — Other Ambulatory Visit (HOSPITAL_COMMUNITY): Payer: Self-pay | Admitting: Student

## 2020-10-20 ENCOUNTER — Ambulatory Visit (INDEPENDENT_AMBULATORY_CARE_PROVIDER_SITE_OTHER): Payer: 59

## 2020-10-20 ENCOUNTER — Other Ambulatory Visit: Payer: Self-pay

## 2020-10-20 ENCOUNTER — Ambulatory Visit (INDEPENDENT_AMBULATORY_CARE_PROVIDER_SITE_OTHER): Payer: 59 | Admitting: Podiatry

## 2020-10-20 ENCOUNTER — Encounter: Payer: Self-pay | Admitting: Podiatry

## 2020-10-20 DIAGNOSIS — M7671 Peroneal tendinitis, right leg: Secondary | ICD-10-CM | POA: Diagnosis not present

## 2020-10-20 DIAGNOSIS — M25552 Pain in left hip: Secondary | ICD-10-CM | POA: Diagnosis not present

## 2020-10-20 DIAGNOSIS — M25512 Pain in left shoulder: Secondary | ICD-10-CM | POA: Diagnosis present

## 2020-10-20 DIAGNOSIS — S92301A Fracture of unspecified metatarsal bone(s), right foot, initial encounter for closed fracture: Secondary | ICD-10-CM

## 2020-10-20 DIAGNOSIS — M79642 Pain in left hand: Secondary | ICD-10-CM | POA: Insufficient documentation

## 2020-10-20 DIAGNOSIS — W19XXXA Unspecified fall, initial encounter: Secondary | ICD-10-CM | POA: Insufficient documentation

## 2020-10-20 DIAGNOSIS — M79671 Pain in right foot: Secondary | ICD-10-CM | POA: Diagnosis not present

## 2020-10-20 DIAGNOSIS — M1812 Unilateral primary osteoarthritis of first carpometacarpal joint, left hand: Secondary | ICD-10-CM | POA: Insufficient documentation

## 2020-10-20 MED ORDER — PANTOPRAZOLE SODIUM 40 MG PO TBEC: DELAYED_RELEASE_TABLET | ORAL | 5 refills | Status: DC

## 2020-10-20 MED ORDER — TRIAMCINOLONE ACETONIDE 10 MG/ML IJ SUSP
10.0000 mg | Freq: Once | INTRAMUSCULAR | Status: AC
  Administered 2020-10-20: 10 mg

## 2020-10-25 NOTE — Progress Notes (Signed)
Subjective:   Patient ID: Sarah Cooley, female   DOB: 55 y.o.   MRN: [id number]   HPI Patient presents stating she has developed inflammation again around her right foot and states she does not remember injury.  States she was doing much better this just occurred recently   ROS      Objective:  Physical Exam  Neurovascular status intact with history of fracture of the right fifth metatarsal that appears to be healing uneventfully with pain is now more proximal to this area at the insertion base of fifth metatarsal with inflammation noted     Assessment:  Closed fracture of the right fifth metatarsal shaft with possibility for inflammation or possibility for peroneal tendinitis     Plan:  H&P reviewed both conditions and x-ray.  Today I did do a careful proximal injection 3 mg dexamethasone Kenalog 5 mg Xylocaine and I discussed ice therapy and if this does not get better we may need to get an MRI and ultimately this may require fixation of the fifth metatarsal with bone graft.  At this point we will get a hold off and see how the response to this conservative treatment I did  X-rays indicate there is healing of the fifth metatarsal shaft right slight lateral opening but it appears stable

## 2020-10-26 ENCOUNTER — Other Ambulatory Visit: Payer: Self-pay | Admitting: Cardiology

## 2020-10-27 ENCOUNTER — Encounter: Payer: Self-pay | Admitting: Nurse Practitioner

## 2020-11-25 ENCOUNTER — Other Ambulatory Visit (HOSPITAL_COMMUNITY): Payer: Self-pay | Admitting: Family Medicine

## 2020-11-25 ENCOUNTER — Ambulatory Visit (HOSPITAL_COMMUNITY)
Admission: RE | Admit: 2020-11-25 | Discharge: 2020-11-25 | Disposition: A | Discharge status: Home / Self Care | Payer: 59 | Source: Ambulatory Visit | Attending: Family Medicine | Admitting: Family Medicine

## 2020-11-25 ENCOUNTER — Other Ambulatory Visit: Payer: Self-pay

## 2020-11-25 DIAGNOSIS — R1011 Right upper quadrant pain: Secondary | ICD-10-CM | POA: Diagnosis not present

## 2020-11-25 MED ORDER — IOHEXOL 9 MG/ML PO SOLN
ORAL | Status: AC
  Filled 2020-11-25: qty 1000

## 2020-11-25 MED ORDER — IOHEXOL 300 MG/ML  SOLN
100.0000 mL | Freq: Once | INTRAMUSCULAR | Status: AC | PRN
  Administered 2020-11-25: 100 mL via INTRAVENOUS

## 2020-12-05 ENCOUNTER — Ambulatory Visit: Payer: 59 | Admitting: Gastroenterology

## 2020-12-08 ENCOUNTER — Ambulatory Visit: Payer: 59 | Admitting: Neurology

## 2020-12-11 NOTE — Progress Notes (Signed)
Referring Provider: Celene Squibb, MD Primary Care Physician:  Pablo Lawrence, NP Primary GI Physician: Dr. Abbey Chatters  Chief Complaint  Patient presents with  . Abdominal Pain    Right side; PCP added Pepcid-didn't help. Had CT-didn't show anything. H.Pylori breath test negative. Mostly feeling of discomfort now  . Diarrhea    episodes  . Constipation    episodes  . Nausea    No vomiting    HPI:   Sarah Cooley is a 55 y.o. female presenting today for further evaluation of alternating constipation and diarrhea, RUQ abdominal pain, nausea, and weight loss.  History of GERD, IBS with alternating constipation and diarrhea, C. difficile in 2019 treated with vancomycin. Colonoscopy up-to-date in 2017 with [facility] with normal exam, due for repeat in 2027. Last EGD in June 2019 with [facility] for epigastric pain, GERD, bloating. Impression with normal esophagus, erythematous mucosa in the gastric body s/p biopsied, erythematous mucosa in the antrum biopsied, normal examined duodenum biopsied. Duodenal biopsies benign, negative for celiac disease. Gastric biopsies with chronic active gastritis, negative for H. Pylori.   Last seen in our office 09/30/2020.  She reported acute onset of watery diarrhea after eating at a seafood buffet the first week of November.  Associated toilet tissue hematochezia with diarrhea and her bottom feeling "raw".  She was empirically treated with Flagyl by PCP and had resolution of diarrhea and toilet tissue hematochezia. Also started a probiotic and had continued this. Since then, she had been struggling with loss of appetite, mild intermittent lower abdominal discomfort, and constipation with occasional "squirts of diarrhea".  Also with documented 12 pound weight loss over the last 6 months, but patient reported this was within the last month in the setting of acute illness.  Suspected patient likely had acute gastroenteritis.  Rectal bleeding likely benign  anorectal source.  Abdominal discomfort likely secondary to constipation in the setting of IBS.  Loss of appetite and early satiety could be secondary to postinfectious gastroparesis.  Plan to trial Motegrity 2 mg daily.  Encouraged she increase water intake and call if symptoms do not improve.  Follow-up in 6-8 weeks.  Consider endoscopic evaluation if persistent symptoms.  No communication with patient since then.  Telemedicine visit with PCP 11/21/2020 reporting another episode of severe diarrhea and abdominal pain over the weekend.  Stated she still had not felt well since prior episode of diarrhea in November.  Felt bloated in the right upper quadrant with tenderness and decreased appetite.  PCP plan to update labs and she was started on Levsin.  Famotidine 20 mg twice daily was also added.  Reviewed labs in Care Everywhere completed 11/21/2020: CBC within normal limits, CMP remarkable for slight elevation of ALT at 37, amylase and lipase within normal limits.  Later had CT A/P with contrast 11/25/2020 with no acute findings.  Evidence of partial left nephrectomy and hepatic steatosis.  Today:   Not taking Motegrity. This was too expensive. Had episode of diarrhea in the later part of of January Started Thursday and resolved later that evening. Woke up with severe right sided abdominal pain radiating to her back early Friday morning. Felt like a gallbladder attack. This was a constant, non-stop pain. Also radiated up and down her right side. Lasted about 30 minutes. Continued to feel like she had a small cantaloupe under her rib cage thereafter. Saw PCP who added famotidine BID but didn't notice improvement. GERD well controlled. Continued with abdominal pain and had  CT which was unrevealing. Felt her abdomen was stolen and constipated as she hadn't been moving her bowels after the diarrhea.   Wakes up with nausea every morning. Notices some improvement in nausea at times with eating. No vomiting.  This started with RUQ pain. Continues to feel something is in her RUQ. Has no appetite/early satiety. Abdominal pain is not as bad, more of a discomfort. No worsening with eating. No OTC NSAIDs, but she is taking meloxicam.  Also taking hydrocodone for fibromyalgia/chronic pain. Takes 1/2 at night and maybe once in the morning.    Now with alternating constipation and diarrhea. Also with new onset intermittent pencil thin stools. Diarrhea presents as 2-3 small watery BMs. Constipation presents as passing small hard stools. Will alternate pretty much every other day. Toilet tissue hematochezia if having several BMs. Has a raw sensation and notes a small amount of blood on toilet tissue. No hematochezia otherwise. No melena. Lower abdominal discomfort prior to BMs and improves thereafter. Never started the Levsin PCP prescribed.   Taking MiraLAX in the morning and Benefiber in the evening. Holds MiraLAX if she has diarrhea. Currently without BM for 3 days. Taking MiraLAX daily.   Down 2 lbs over the last 8 weeks. About 11 lbs since GI issues started in November 2021.    Past Medical History:  Diagnosis Date  . Allergy    seasonal  . Anxiety   . Arthritis   . Balance problem   . Brain aneurysm    2 mm ACA region aneurysm by 09/21/15 MRA  . Breast cancer ([place])   . Breast cancer of lower-inner quadrant of right female breast ([place]) 08/26/2015  . Chronic kidney disease    acute renal failure one occasion  . Chronic low back pain 12/02/2018  . Complication of anesthesia    heart rate drops and O2 sats drop  . Constipation   . Depression   . Diplopia 10/29/2017  . Dizziness 10/29/2017  . Elevated liver function tests   . Family history of adverse reaction to anesthesia    mom has n/v  . Family history of breast cancer   . Family history of colon cancer   . Fibromyalgia   . Fibromyalgia   . Gait abnormality 10/29/2017  . Genetic testing 09/08/2015   Negative genetic testing on the Breast/High  Moderate Risk panel. The Breast High/Moderate Risk gene panel offered by [facility] includes sequencing and deletion/duplication analysis of the following 9 genes: ATM, BRCA1, BRCA2, CDH1, CHEK2, PALB2, PTEN, STK11, and TP53. The report date is September 08, 2015.  The Rest of the Comprehensive Cancer panel has been reflexed and will be available in 2-3 weeks.  POLD1 c.208G>T VUS found on the Remainder of the Comprehensive cancer panel.  The Comprehensive Cancer Panel offered by [facility] includes sequencing and/or deletion duplication testing of the following 32 genes: APC, ATM, AXIN2, BARD1, BMPR1A, BRCA1, BRCA2, BRIP1, CDH1, CDK4, CDKN2A, CHEK2, EPCAM, FANCC, MLH1, MSH2, MSH6, MUTYH, NBN, PALB2, PMS2, POLD1, POLE, PTEN, RAD51C, RAD51D, SCG5/GREM1, SMAD4, STK11, TP53, VHL, and XRCC2.   The report date is 09/12/2015.   Marland Kitchen GERD (gastroesophageal reflux disease)    none recent  . H/O acute renal failure 09/22/15   admitted to [facility]  . Headache    occipital and temporal  . History of hiatal hernia   . Hyperlipemia   . Hyperlipidemia   . Hypertension   . Irritable bowel syndrome (IBS)   . Lyme disease   . MGUS (monoclonal  gammopathy of unknown significance) 03/15/2016  . Neuromuscular disorder (HCC)    fibromyalgia  . Nocturnal myoclonus   . Occasional tremors   . OSA (obstructive sleep apnea)    uses cpap with humidification- auto   . Personal history of radiation therapy   . PONV (postoperative nausea and vomiting)   . Renal cell carcinoma ([place])   . TIA (transient ischemic attack) 2021  . Tremor, essential 10/29/2017    Past Surgical History:  Procedure Laterality Date  . ABDOMINAL HYSTERECTOMY     adenomyosis  . ABDOMINAL HYSTERECTOMY    . BLADDER SURGERY    . BONE GRAFT HIP ILIAC CREST    . BREAST BIOPSY Bilateral   . BREAST LUMPECTOMY    . BREAST SURGERY    . CHOLECYSTECTOMY    . COLONOSCOPY WITH PROPOFOL  05/14/2016   [facility]; entire examined colon normal.  Recommendations  to repeat colonoscopy in 10 years for screening purposes.  . ESOPHAGOGASTRODUODENOSCOPY (EGD) WITH PROPOFOL  03/24/2018   [facility]; normal esophagus, erythematous mucosa in the gastric body and antrum s/p biopsy, normal examined duodenum s/p biopsy.  Pathology with benign duodenal biopsy.  Stomach biopsies with chronic inactive gastritis, negative for H. pylori.  . FRACTURE SURGERY Right    wrist  . KNEE ARTHROSCOPY    . KNEE ARTHROSCOPY W/ ACL RECONSTRUCTION     right  . RADIOACTIVE SEED GUIDED PARTIAL MASTECTOMY WITH AXILLARY SENTINEL LYMPH NODE BIOPSY Right 10/13/2015   Procedure: RADIOACTIVE SEED GUIDED PARTIAL MASTECTOMY WITH AXILLARY SENTINEL LYMPH NODE BIOPSY;  Surgeon: Erroll Luna, MD;  Location: [facility];  Service: General;  Laterality: Right;  . right carpal and cubital tunnel release     . ROBOTIC ASSITED PARTIAL NEPHRECTOMY Left 06/19/2018   Procedure: XI ROBOTIC ASSITED PARTIAL NEPHRECTOMY;  Surgeon: Ardis Hughs, MD;  Location: [facility];  Service: Urology;  Laterality: Left;  . WRIST SURGERY      Current Outpatient Medications  Medication Sig Dispense Refill  . ALPRAZolam (XANAX) 1 MG tablet Take 1 mg by mouth at bedtime.    Marland Kitchen diltiazem (CARDIZEM CD) 180 MG 24 hr capsule TAKE ONE CAPSULE BY MOUTH DAILY 90 capsule 3  . FLUoxetine (PROZAC) 20 MG capsule Take 60 mg by mouth daily.  12  . furosemide (LASIX) 20 MG tablet Take 20 mg by mouth daily. PRN    . HYDROcodone-acetaminophen (NORCO) 7.5-325 MG tablet Take 0.5 tablets by mouth at bedtime. Or TID PRN    . losartan (COZAAR) 25 MG tablet Take 25 mg by mouth daily.  0  . meloxicam (MOBIC) 7.5 MG tablet Take 7.5 mg by mouth 2 (two) times daily.    . ondansetron (ZOFRAN) 4 MG tablet Take 1 tablet (4 mg total) by mouth every 8 (eight) hours as needed for nausea or vomiting. 30 tablet 0  . OVER THE COUNTER MEDICATION Take 1 capsule by mouth daily as needed. Diphenhydramate Over The Counter Anti-emetic    . pantoprazole  (PROTONIX) 40 MG tablet Take 1 tablet (40 mg total) by mouth 2 (two) times daily. 60 tablet 3  . Polyethylene Glycol 3350 (MIRALAX PO) Take by mouth daily. Holds if diarrhea    . pramipexole (MIRAPEX) 0.125 MG tablet TAKE ONE TABLET BY MOUTH DAILY AT DINNERTIME AND TAKE TWO TABLETS BY MOUTH AT BEDTIME 90 tablet 5  . pravastatin (PRAVACHOL) 10 MG tablet Take 10 mg by mouth daily.    Marland Kitchen topiramate (TOPAMAX) 50 MG tablet Take 2 in the morning,  take 3 at night 440 tablet 1  . Vitamin D, Ergocalciferol, (DRISDOL) 50000 units CAPS capsule Take 50,000 Units by mouth every 7 (seven) days. Take 1 tablet (50,000 units) by mouth every Thursday    . Wheat Dextrin (BENEFIBER PO) Take by mouth daily.    . polyethylene glycol-electrolytes (NULYTELY) 420 g solution As directed 4000 mL 0   No current facility-administered medications for this visit.    Allergies as of 12/12/2020 - Review Complete 12/12/2020  Allergen Reaction Noted  . Ramipril Nausea And Vomiting and Other (See Comments) 02/12/2012  . Minocycline hcl Hives 02/12/2012  . Other Rash 10/11/2015  . Altace [ramipril] Nausea And Vomiting and Nausea Only 01/18/2018  . Lyrica [pregabalin] Nausea And Vomiting 01/18/2018  . Adhesive [tape] Rash 10/11/2015  . Bactrim [sulfamethoxazole-trimethoprim] Other (See Comments) 10/11/2015  . Drixoral [brompheniramine-pseudoeph] Rash 02/12/2012  . Erythromycin Other (See Comments) 02/12/2012  . Minocin [minocycline hcl] Nausea And Vomiting and Rash 01/18/2018  . Septra [sulfamethoxazole-trimethoprim] Rash 01/18/2018  . Sinutab [chlorphen-pseudoephed-apap] Rash 02/12/2012    Family History  Problem Relation Age of Onset  . Hypertension Mother   . Autoimmune disease Mother        lichen planus  . Eczema Mother   . Parkinson's disease Mother   . Mental illness Mother        bipolar  . Heart disease Maternal Aunt   . Lung cancer Paternal Aunt   . Breast cancer Paternal Aunt        dx in his 73s  .  Parkinsonism Paternal Aunt   . Lung cancer Paternal Grandfather   . Colon cancer Paternal Uncle        dx in his 65s  . Colon cancer Paternal Uncle        dx in his 67s  . Melanoma Father 4  . Psoriasis Father   . Hypertension Father   . Heart disease Father   . Breast cancer Sister        dx in her 27s  . Lupus Sister   . COPD Maternal Uncle   . Skin cancer Maternal Uncle   . Colon cancer Paternal Uncle        dx in his 31s  . Mental illness Son        bipolar  . Polycystic ovary syndrome Other     Social History   Socioeconomic History  . Marital status: Married    Spouse name: Psychologist, clinical  . Number of children: 2  . Years of education: 74  . Highest education level: Not on file  Occupational History  . Occupation: physician office  Tobacco Use  . Smoking status: Never Smoker  . Smokeless tobacco: Never Used  Vaping Use  . Vaping Use: Never used  Substance and Sexual Activity  . Alcohol use: Not Currently    Comment: maybe one a year  . Drug use: Never  . Sexual activity: Yes    Birth control/protection: Surgical  Other Topics Concern  . Not on file  Social History Narrative   ** Merged History Encounter **       Lives with husband Husband on disability Children grown and moved worries about family illness- mother and sister Caffeine use:     Social Determinants of Health   Financial Resource Strain: Not on file  Food Insecurity: Not on file  Transportation Needs: Not on file  Physical Activity: Not on file  Stress: Not on file  Social Connections: Not on file  Review of Systems: Gen: Denies fever, chills, cold or flulike symptoms, presyncope, syncope. CV: Denies chest pain or palpitations. Resp: Denies dyspnea or cough. GI: See HPI Heme: See HPI  Physical Exam: BP 127/81   Pulse 82   Temp (!) 96.3 F (35.7 C) (Temporal)   Ht 5' 8"  (1.727 m)   Wt 227 lb (103 kg)   BMI 34.52 kg/m  General:   Alert and oriented. No distress noted. Pleasant  and cooperative.  Head:  Normocephalic and atraumatic. Eyes:  Conjuctiva clear without scleral icterus. Heart:  S1, S2 present without murmurs appreciated. Lungs:  Clear to auscultation bilaterally. No wheezes, rales, or rhonchi. No distress.  Abdomen:  +BS, soft, and non-distended. Mild TTP in RUQ. No rebound or guarding. No HSM or masses noted. Msk:  Symmetrical without gross deformities. Normal posture. Extremities:  Without edema. Neurologic:  Alert and  oriented x4 Psych:  Normal mood and affect.   Assessment: 55 year old female with history of GERD, IBS with alternating constipation diarrhea, C. difficile in 2019 presenting today for further evaluation of alternating constipation and diarrhea, RUQ abdominal pain with associated nausea without vomiting, and weight loss secondary to ongoing GI issues.   Alternating constipation and diarrhea: History of IBS-mixed type; however, patient had been doing very well with soft, formed BMs daily until November 2021 when she had acute onset watery diarrhea after eating at a seafood buffet.  Since then, she had one other episode of significant watery diarrhea in late January, otherwise has been alternating between 2-3 small, watery BMs and passing small, hard stools. Now with no BM in 3 days taking MiraLAX and Benefiber daily. Also with new onset intermittent pencil thin stools as well as intermittent toilet tissue hematochezia with diarrhea which also started in November 2021.  She has lost about 11 pounds since November 2021 which I suspect is likely secondary to upper GI issues discussed below.  Last colonoscopy in 2017 with [facility] with normal exam.  Recent CT A/P with contrast 11/25/2020 with no acute findings.   Suspect patient may have had acute gastroenteritis in November 2021 with flare of IBS and possible postinfectious IBS.  May also have some baseline constipation with overflow diarrhea. Toilet tissue hematochezia may be secondary to hemorrhoids  or benign anorectal source.  With change in bowel habits and pencil thin stools, cannot rule out large colon polyps or malignancy.  She is going to need colonoscopy for further evaluation.  RUQ abdominal pain: Acute onset severe RUQ abdominal pain radiating to her back in the latter part of January.  Severe symptoms last about 30 minutes, but she continues to feel that she has a "small cantaloupe" under her right rib cage.  Not worsened with meals.  Reports no appetite/early satiety which has been a problem since what was suspected to be acute gastroenteritis in November 2021 and now with morning nausea without vomiting, sometimes improved with eating.  PCP added famotidine twice daily to daily PPI, without significant improvement. GERD well controlled. No OTC NSAIDs, but she does take Meloxicam. Recent labs 11/21/20 with CBC WNL, CMP remarkable for slight elevation of ALT at 37, amylase and lipase WNL.  CT A/P with contrast 11/25/2020 with no acute findings, history of cholecystectomy, fatty liver. Last EGD with [facility] in 2019 with erythematous mucosa in the gastric body and antrum with biopsies revealing chronic active gastritis, negative H. Pylori.  Differentials include gastritis, duodenitis, PUD, specifically query duodenal ulcer as nausea seems to improve with eating.  Doubt choledocholithiasis as ALT is only slightly elevated and no ductal dilation mentioned on CT. She is going to need EGD for further evaluation.    Plan:  1.  Stop famotidine and increase Protonix to 40 mg twice daily. 2.  Zofran 4 mg every 8 hours as needed for nausea. 3.  Update HFP. 4.  Avoid fried, fatty, greasy, spicy, citrus foods.  Avoid caffeine and carbonated beverages. 5.  Eat 4-6 small meals daily. 6.  Limit meloxicam as much as possible and avoid other NSAIDs. 7.  Stop MiraLAX and try Linzess 72 mcg daily for constipation.  8.  Continue Benefiber daily. 9.  Proceed with EGD and colonoscopy in the near future with  propofol with Dr. Abbey Chatters for further evaluation of change in bowel habits, hematochezia, RUQ abdominal pain, nausea, early satiety.The risks, benefits, and alternatives have been discussed with the patient in detail. The patient states understanding and desires to proceed. ASA III 10.  Follow-up after procedures.  Requested for 1 week progress report on efficacy of Linzess.

## 2020-12-12 ENCOUNTER — Encounter: Payer: Self-pay | Admitting: Gastroenterology

## 2020-12-12 ENCOUNTER — Other Ambulatory Visit: Payer: Self-pay

## 2020-12-12 ENCOUNTER — Ambulatory Visit (INDEPENDENT_AMBULATORY_CARE_PROVIDER_SITE_OTHER): Payer: 59 | Admitting: Gastroenterology

## 2020-12-12 ENCOUNTER — Telehealth: Payer: Self-pay | Admitting: *Deleted

## 2020-12-12 VITALS — BP 127/81 | HR 82 | Temp 96.3°F | Ht 68.0 in | Wt 227.0 lb

## 2020-12-12 DIAGNOSIS — R109 Unspecified abdominal pain: Secondary | ICD-10-CM | POA: Insufficient documentation

## 2020-12-12 DIAGNOSIS — R194 Change in bowel habit: Secondary | ICD-10-CM | POA: Insufficient documentation

## 2020-12-12 DIAGNOSIS — R110 Nausea: Secondary | ICD-10-CM

## 2020-12-12 DIAGNOSIS — K219 Gastro-esophageal reflux disease without esophagitis: Secondary | ICD-10-CM

## 2020-12-12 DIAGNOSIS — R630 Anorexia: Secondary | ICD-10-CM

## 2020-12-12 DIAGNOSIS — R6881 Early satiety: Secondary | ICD-10-CM

## 2020-12-12 DIAGNOSIS — R1010 Upper abdominal pain, unspecified: Secondary | ICD-10-CM

## 2020-12-12 DIAGNOSIS — K625 Hemorrhage of anus and rectum: Secondary | ICD-10-CM | POA: Insufficient documentation

## 2020-12-12 DIAGNOSIS — R7401 Elevation of levels of liver transaminase levels: Secondary | ICD-10-CM

## 2020-12-12 MED ORDER — PEG 3350-KCL-NA BICARB-NACL 420 G PO SOLR: ORAL | 0 refills | Status: DC

## 2020-12-12 MED ORDER — ONDANSETRON HCL 4 MG PO TABS: 4.0000 mg | ORAL_TABLET | Freq: Three times a day (TID) | ORAL | 0 refills | Status: DC | PRN

## 2020-12-12 MED ORDER — PANTOPRAZOLE SODIUM 40 MG PO TBEC: 40.0000 mg | DELAYED_RELEASE_TABLET | Freq: Two times a day (BID) | ORAL | 3 refills | Status: DC

## 2020-12-12 MED ORDER — GAVILYTE-G 236 G PO SOLR
4000.0000 mL | Freq: Once | ORAL | 0 refills | Status: AC
Start: 2020-12-12 — End: 2020-12-12

## 2020-12-12 NOTE — Patient Instructions (Addendum)
We will arrange for her to have an upper endoscopy and colonoscopy in the near future with Dr. Abbey Chatters.  Please have labs completed at [facility].  We are rechecking your liver function test. [address], [place], [place] [address]  For upper abdominal pain, nausea, lack of appetite: Increase Protonix to 40 mg twice daily 30 minutes before breakfast and dinner. Stop famotidine for now. You may use Zofran 4 mg every 8 hours as needed for nausea. Try eating 4-6 small meals daily. Avoid fried, fatty, greasy, spicy, citrus foods. Avoid caffeine and carbonated beverages. Limit Meloxicam as much as possible.   For alternating constipation and diarrhea: I suspect you are likely primarily constipated with some overflow diarrhea.  Stop MiraLAX and try try Linzess 72 mcg daily for constipation.  Take 1 capsule every morning 30 minutes before your first meal. Linzess can cause diarrhea for the first few days, but this should taper off. Continue Benefiber daily.  Please call with a progress report in 1 week.  We will follow-up with you after your procedures.  Do not hesitate to call with any questions or concerns prior.  Aliene Altes, PA-C [facility]

## 2020-12-12 NOTE — Addendum Note (Signed)
Addended by: Cheron Every on: 12/12/2020 04:45 PM   Modules accepted: Orders

## 2020-12-12 NOTE — Telephone Encounter (Signed)
Called pt. She has been scheduled for TCS/EGD with Dr. Abbey Chatters, ASA 3 on 3/22 pm appt. Aware will mail prep instructions with pre-op/covid test appt. Confirmed mailing address.

## 2020-12-13 ENCOUNTER — Encounter: Payer: Self-pay | Admitting: *Deleted

## 2020-12-13 LAB — HEPATIC FUNCTION PANEL
AG Ratio: 1.4 (calc) (ref 1.0–2.5)
ALT: 30 U/L — ABNORMAL HIGH (ref 6–29)
AST: 26 U/L (ref 10–35)
Albumin: 4.4 g/dL (ref 3.6–5.1)
Alkaline phosphatase (APISO): 70 U/L (ref 37–153)
Bilirubin, Direct: 0.1 mg/dL (ref 0.0–0.2)
Globulin: 3.2 g/dL (calc) (ref 1.9–3.7)
Indirect Bilirubin: 0.3 mg/dL (calc) (ref 0.2–1.2)
Total Bilirubin: 0.4 mg/dL (ref 0.2–1.2)
Total Protein: 7.6 g/dL (ref 6.1–8.1)

## 2020-12-18 ENCOUNTER — Encounter: Payer: Self-pay | Admitting: Gastroenterology

## 2020-12-19 ENCOUNTER — Other Ambulatory Visit (HOSPITAL_COMMUNITY): Payer: Self-pay

## 2020-12-19 DIAGNOSIS — Z171 Estrogen receptor negative status [ER-]: Secondary | ICD-10-CM

## 2020-12-19 DIAGNOSIS — D472 Monoclonal gammopathy: Secondary | ICD-10-CM

## 2020-12-19 DIAGNOSIS — C50311 Malignant neoplasm of lower-inner quadrant of right female breast: Secondary | ICD-10-CM

## 2020-12-19 NOTE — Progress Notes (Signed)
Jodi Mourning, Tivis Ringer, PA-C  [place], [place], CMA ALT slightly elevated at 30, upper limit of normal is 29. Suspect this may be secondary to fatty liver. Recent US in care everywhere with fatty liver but no other concerning findings. No bilie duct abnormalities and history of cholecystectomy.   We will continue to follow this moving forward. For now, focus on fatty liver instructions below. We will plan to repeat LFTs when I see her again in the office after EGD. If still elevated, we will complete additional workup.   Instructions for fatty liver:  Recommend 1-2# weight loss per week until ideal body weight through exercise & diet.  Low fat/cholesterol diet.   Avoid sweets, sodas, fruit juices, sweetened beverages like tea, etc.  Gradually increase exercise from 15 min daily up to 1 hr per day 5 days/week.  Limit alcohol use.   Also avoid OTC supplements and herbal teas.   Mail NAFLD handout.

## 2020-12-20 ENCOUNTER — Inpatient Hospital Stay (HOSPITAL_COMMUNITY): Payer: Medicare HMO | Attending: Hematology

## 2020-12-20 ENCOUNTER — Other Ambulatory Visit: Payer: Self-pay

## 2020-12-20 DIAGNOSIS — R269 Unspecified abnormalities of gait and mobility: Secondary | ICD-10-CM | POA: Insufficient documentation

## 2020-12-20 DIAGNOSIS — R251 Tremor, unspecified: Secondary | ICD-10-CM | POA: Diagnosis not present

## 2020-12-20 DIAGNOSIS — C50311 Malignant neoplasm of lower-inner quadrant of right female breast: Secondary | ICD-10-CM

## 2020-12-20 DIAGNOSIS — Z923 Personal history of irradiation: Secondary | ICD-10-CM | POA: Insufficient documentation

## 2020-12-20 DIAGNOSIS — M898X9 Other specified disorders of bone, unspecified site: Secondary | ICD-10-CM | POA: Diagnosis not present

## 2020-12-20 DIAGNOSIS — R519 Headache, unspecified: Secondary | ICD-10-CM | POA: Diagnosis not present

## 2020-12-20 DIAGNOSIS — Z79899 Other long term (current) drug therapy: Secondary | ICD-10-CM | POA: Diagnosis not present

## 2020-12-20 DIAGNOSIS — E538 Deficiency of other specified B group vitamins: Secondary | ICD-10-CM | POA: Diagnosis not present

## 2020-12-20 DIAGNOSIS — M545 Low back pain, unspecified: Secondary | ICD-10-CM | POA: Insufficient documentation

## 2020-12-20 DIAGNOSIS — Z85528 Personal history of other malignant neoplasm of kidney: Secondary | ICD-10-CM | POA: Diagnosis not present

## 2020-12-20 DIAGNOSIS — R100 Acute abdomen: Secondary | ICD-10-CM | POA: Insufficient documentation

## 2020-12-20 DIAGNOSIS — R634 Abnormal weight loss: Secondary | ICD-10-CM | POA: Insufficient documentation

## 2020-12-20 DIAGNOSIS — Z86000 Personal history of in-situ neoplasm of breast: Secondary | ICD-10-CM | POA: Diagnosis not present

## 2020-12-20 DIAGNOSIS — D472 Monoclonal gammopathy: Secondary | ICD-10-CM | POA: Diagnosis not present

## 2020-12-20 LAB — CBC WITH DIFFERENTIAL/PLATELET
Abs Immature Granulocytes: 0.01 10*3/uL (ref 0.00–0.07)
Basophils Absolute: 0 10*3/uL (ref 0.0–0.1)
Basophils Relative: 0 %
Eosinophils Absolute: 0.1 10*3/uL (ref 0.0–0.5)
Eosinophils Relative: 2 %
HCT: 40.6 % (ref 36.0–46.0)
Hemoglobin: 13.2 g/dL (ref 12.0–15.0)
Immature Granulocytes: 0 %
Lymphocytes Relative: 24 %
Lymphs Abs: 1.3 10*3/uL (ref 0.7–4.0)
MCH: 31.1 pg (ref 26.0–34.0)
MCHC: 32.5 g/dL (ref 30.0–36.0)
MCV: 95.8 fL (ref 80.0–100.0)
Monocytes Absolute: 0.5 10*3/uL (ref 0.1–1.0)
Monocytes Relative: 9 %
Neutro Abs: 3.4 10*3/uL (ref 1.7–7.7)
Neutrophils Relative %: 65 %
Platelets: 190 10*3/uL (ref 150–400)
RBC: 4.24 MIL/uL (ref 3.87–5.11)
RDW: 12.6 % (ref 11.5–15.5)
WBC: 5.3 10*3/uL (ref 4.0–10.5)
nRBC: 0 % (ref 0.0–0.2)

## 2020-12-20 LAB — COMPREHENSIVE METABOLIC PANEL
ALT: 27 U/L (ref 0–44)
AST: 22 U/L (ref 15–41)
Albumin: 3.9 g/dL (ref 3.5–5.0)
Alkaline Phosphatase: 62 U/L (ref 38–126)
Anion gap: 10 (ref 5–15)
BUN: 21 mg/dL — ABNORMAL HIGH (ref 6–20)
CO2: 21 mmol/L — ABNORMAL LOW (ref 22–32)
Calcium: 8.8 mg/dL — ABNORMAL LOW (ref 8.9–10.3)
Chloride: 110 mmol/L (ref 98–111)
Creatinine, Ser: 0.98 mg/dL (ref 0.44–1.00)
GFR, Estimated: >60 mL/min (ref >60)
Glucose, Bld: 98 mg/dL (ref 70–99)
Potassium: 3.3 mmol/L — ABNORMAL LOW (ref 3.5–5.1)
Sodium: 141 mmol/L (ref 135–145)
Total Bilirubin: 0.3 mg/dL (ref 0.3–1.2)
Total Protein: 7.2 g/dL (ref 6.5–8.1)

## 2020-12-20 LAB — VITAMIN D 25 HYDROXY (VIT D DEFICIENCY, FRACTURES): Vit D, 25-Hydroxy: 30.54 ng/mL (ref 30–100)

## 2020-12-20 LAB — FERRITIN: Ferritin: 59 ng/mL (ref 11–307)

## 2020-12-20 LAB — IRON AND TIBC
Iron: 71 ug/dL (ref 28–170)
Saturation Ratios: 23 % (ref 10.4–31.8)
TIBC: 303 ug/dL (ref 250–450)
UIBC: 232 ug/dL

## 2020-12-20 LAB — VITAMIN B12: Vitamin B-12: 179 pg/mL — ABNORMAL LOW (ref 180–914)

## 2020-12-20 LAB — LACTATE DEHYDROGENASE: LDH: 160 U/L (ref 98–192)

## 2020-12-21 LAB — KAPPA/LAMBDA LIGHT CHAINS
Kappa free light chain: 97.9 mg/L — ABNORMAL HIGH (ref 3.3–19.4)
Kappa, lambda light chain ratio: 5.26 — ABNORMAL HIGH (ref 0.26–1.65)
Lambda free light chains: 18.6 mg/L (ref 5.7–26.3)

## 2020-12-21 LAB — IGG, IGA, IGM
IgA: 484 mg/dL — ABNORMAL HIGH (ref 87–352)
IgG (Immunoglobin G), Serum: 899 mg/dL (ref 586–1602)
IgM (Immunoglobulin M), Srm: 75 mg/dL (ref 26–217)

## 2020-12-22 LAB — PROTEIN ELECTROPHORESIS, SERUM
A/G Ratio: 1.2 (ref 0.7–1.7)
Albumin ELP: 3.7 g/dL (ref 2.9–4.4)
Alpha-1-Globulin: 0.2 g/dL (ref 0.0–0.4)
Alpha-2-Globulin: 0.7 g/dL (ref 0.4–1.0)
Beta Globulin: 1.1 g/dL (ref 0.7–1.3)
Gamma Globulin: 1 g/dL (ref 0.4–1.8)
Globulin, Total: 3.1 g/dL (ref 2.2–3.9)
Total Protein ELP: 6.8 g/dL (ref 6.0–8.5)

## 2020-12-27 ENCOUNTER — Other Ambulatory Visit: Payer: Self-pay

## 2020-12-27 ENCOUNTER — Inpatient Hospital Stay (HOSPITAL_BASED_OUTPATIENT_CLINIC_OR_DEPARTMENT_OTHER): Payer: Medicare HMO | Admitting: Oncology

## 2020-12-27 DIAGNOSIS — Z171 Estrogen receptor negative status [ER-]: Secondary | ICD-10-CM | POA: Diagnosis not present

## 2020-12-27 DIAGNOSIS — E538 Deficiency of other specified B group vitamins: Secondary | ICD-10-CM | POA: Diagnosis not present

## 2020-12-27 DIAGNOSIS — C50311 Malignant neoplasm of lower-inner quadrant of right female breast: Secondary | ICD-10-CM

## 2020-12-27 DIAGNOSIS — D472 Monoclonal gammopathy: Secondary | ICD-10-CM
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
## 2020-12-28 ENCOUNTER — Other Ambulatory Visit (HOSPITAL_COMMUNITY): Payer: Self-pay | Admitting: Hematology

## 2020-12-28 ENCOUNTER — Encounter (HOSPITAL_COMMUNITY): Payer: Self-pay | Admitting: Oncology

## 2020-12-28 DIAGNOSIS — Z1231 Encounter for screening mammogram for malignant neoplasm of breast: Secondary | ICD-10-CM

## 2020-12-28 DIAGNOSIS — E538 Deficiency of other specified B group vitamins: Secondary | ICD-10-CM | POA: Insufficient documentation

## 2021-01-05 ENCOUNTER — Other Ambulatory Visit: Payer: Self-pay

## 2021-01-05 ENCOUNTER — Telehealth: Payer: Self-pay

## 2021-01-05 NOTE — Telephone Encounter (Signed)
Sarah Cooley at pre-service center called office and [place]. Pt is scheduled for TCS/EGD 01/10/21. EGD needs PA. [phone], ext [phone].  Pt now has [employer]. PA for EGD submitted via [employer] website. [employer]# [id number], valid 01/10/21-02/09/21. No PA needed for TCS.  Tried to call Sarah Cooley back, LMOVM to inform her of PA

## 2021-01-05 NOTE — Patient Instructions (Addendum)
Sarah Cooley  01/05/2021     @PREFPERIOPPHARMACY @   Your procedure is scheduled on Tuesday, 01/10/21.  Report to [facility] at 12:00 P.M.  Call this number if you have problems the morning of surgery:  [phone]   Remember:  Do not eat or drink after midnight.      Take these medicines the morning of surgery with A SIP OF WATER prozac, losartan, diltiazem, protonix, and zofran and xanax if needed.    Do not wear jewelry, make-up or nail polish.  Do not wear lotions, powders, or perfumes, or deodorant.  Do not shave 48 hours prior to surgery.  Men may shave face and neck.  Do not bring valuables to the hospital.  [facility] is not responsible for any belongings or valuables.  Contacts, dentures or bridgework may not be worn into surgery.  Leave your suitcase in the car.  After surgery it may be brought to your room.  For patients admitted to the hospital, discharge time will be determined by your treatment team.  Patients discharged the day of surgery will not be allowed to drive home.   Name and phone number of your driver:   driver Special instructions:  Follow instructions for diet and prep given in the office.    Please read over the following fact sheets that you were given. Anesthesia Post-op Instructions and Care and Recovery After Surgery      Colonoscopy, Adult A colonoscopy is a procedure to look at the entire large intestine. This procedure is done using a long, thin, flexible tube that has a camera on the end. You may have a colonoscopy:  As a part of normal colorectal screening.  If you have certain symptoms, such as: ? A low number of red blood cells in your blood (anemia). ? Diarrhea that does not go away. ? Pain in your abdomen. ? Blood in your stool. A colonoscopy can help screen for and diagnose medical problems, including:  Tumors.  Extra tissue that grows where mucus forms (polyps).  Inflammation.  Areas of bleeding. Tell your  health care provider about:  Any allergies you have.  All medicines you are taking, including vitamins, herbs, eye drops, creams, and over-the-counter medicines.  Any problems you or family members have had with anesthetic medicines.  Any blood disorders you have.  Any surgeries you have had.  Any medical conditions you have.  Any problems you have had with having bowel movements.  Whether you are pregnant or may be pregnant. What are the risks? Generally, this is a safe procedure. However, problems may occur, including:  Bleeding.  Damage to your intestine.  Allergic reactions to medicines given during the procedure.  Infection. This is rare. What happens before the procedure? Eating and drinking restrictions Follow instructions from your health care provider about eating or drinking restrictions, which may include:  A few days before the procedure: ? Follow a low-fiber diet. ? Avoid nuts, seeds, dried fruit, raw fruits, and vegetables.  1-3 days before the procedure: ? Eat only gelatin dessert or ice pops. ? Drink only clear liquids, such as water, clear juice, clear broth or bouillon, black coffee or tea, or clear soft drinks or sports drinks. ? Avoid liquids that contain red or purple dye.  The day of the procedure: ? Do not eat solid foods. You may continue to drink clear liquids until up to 2 hours before the procedure. ? Do not eat or drink anything starting 2 hours  before the procedure, or within the time period that your health care provider recommends. Bowel prep If you were prescribed a bowel prep to take by mouth (orally) to clean out your colon:  Take it as told by your health care provider. Starting the day before your procedure, you will need to drink a large amount of liquid medicine. The liquid will cause you to have many bowel movements of loose stool until your stool becomes almost clear or light green.  If your skin or the opening between the  buttocks (anus) gets irritated from diarrhea, you may relieve the irritation using: ? Wipes with medicine in them, such as adult wet wipes with aloe and vitamin E. ? A product to soothe skin, such as petroleum jelly.  If you vomit while drinking the bowel prep: ? Take a break for up to 60 minutes. ? Begin the bowel prep again. ? Call your health care provider if you keep vomiting or you cannot take the bowel prep without vomiting.  To clean out your colon, you may also be given: ? Laxative medicines. These help you have a bowel movement. ? Instructions for enema use. An enema is liquid medicine injected into your rectum. Medicines Ask your health care provider about:  Changing or stopping your regular medicines or supplements. This is especially important if you are taking iron supplements, diabetes medicines, or blood thinners.  Taking medicines such as aspirin and ibuprofen. These medicines can thin your blood. Do not take these medicines unless your health care provider tells you to take them.  Taking over-the-counter medicines, vitamins, herbs, and supplements. General instructions  Ask your health care provider what steps will be taken to help prevent infection. These may include washing skin with a germ-killing soap.  Plan to have someone take you home from the hospital or clinic. What happens during the procedure?  An IV will be inserted into one of your veins.  You may be given one or more of the following: ? A medicine to help you relax (sedative). ? A medicine to numb the area (local anesthetic). ? A medicine to make you fall asleep (general anesthetic). This is rarely needed.  You will lie on your side with your knees bent.  The tube will: ? Have oil or gel put on it (be lubricated). ? Be inserted into your anus. ? Be gently eased through all parts of your large intestine.  Air will be sent into your colon to keep it open. This may cause some pressure or  cramping.  Images will be taken with the camera and will appear on a screen.  A small tissue sample may be removed to be looked at under a microscope (biopsy). The tissue may be sent to a lab for testing if any signs of problems are found.  If small polyps are found, they may be removed and checked for cancer cells.  When the procedure is finished, the tube will be removed. The procedure may vary among health care providers and hospitals.   What happens after the procedure?  Your blood pressure, heart rate, breathing rate, and blood oxygen level will be monitored until you leave the hospital or clinic.  You may have a small amount of blood in your stool.  You may pass gas and have mild cramping or bloating in your abdomen. This is caused by the air that was used to open your colon during the exam.  Do not drive for 24 hours after the procedure.  It is up to you to get the results of your procedure. Ask your health care provider, or the department that is doing the procedure, when your results will be ready. Summary  A colonoscopy is a procedure to look at the entire large intestine.  Follow instructions from your health care provider about eating and drinking before the procedure.  If you were prescribed an oral bowel prep to clean out your colon, take it as told by your health care provider.  During the colonoscopy, a flexible tube with a camera on its end is inserted into the anus and then passed into the other parts of the large intestine. This information is not intended to replace advice given to you by your health care provider. Make sure you discuss any questions you have with your health care provider. Document Revised: 05/01/2019 Document Reviewed: 05/01/2019 Elsevier Patient Education  2021 [place]. Upper Endoscopy, Adult, Care After This sheet gives you information about how to care for yourself after your procedure. Your health care provider may also give you more  specific instructions. If you have problems or questions, contact your health care provider. What can I expect after the procedure? After the procedure, it is common to have:  A sore throat.  Mild stomach pain or discomfort.  Bloating.  Nausea. Follow these instructions at home:  Follow instructions from your health care provider about what to eat or drink after your procedure.  Return to your normal activities as told by your health care provider. Ask your health care provider what activities are safe for you.  Take over-the-counter and prescription medicines only as told by your health care provider.  If you were given a sedative during the procedure, it can affect you for several hours. Do not drive or operate machinery until your health care provider says that it is safe.  Keep all follow-up visits as told by your health care provider. This is important.   Contact a health care provider if you have:  A sore throat that lasts longer than one day.  Trouble swallowing. Get help right away if:  You vomit blood or your vomit looks like coffee grounds.  You have: ? A fever. ? Bloody, black, or tarry stools. ? A severe sore throat or you cannot swallow. ? Difficulty breathing. ? Severe pain in your chest or abdomen. Summary  After the procedure, it is common to have a sore throat, mild stomach discomfort, bloating, and nausea.  If you were given a sedative during the procedure, it can affect you for several hours. Do not drive or operate machinery until your health care provider says that it is safe.  Follow instructions from your health care provider about what to eat or drink after your procedure.  Return to your normal activities as told by your health care provider. This information is not intended to replace advice given to you by your health care provider. Make sure you discuss any questions you have with your health care provider. Document Revised: 10/06/2019  Document Reviewed: 03/10/2018 Elsevier Patient Education  2021 [place]. Monitored Anesthesia Care Anesthesia refers to techniques, procedures, and medicines that help a person stay safe and comfortable during a medical or dental procedure. Monitored anesthesia care, or sedation, is one type of anesthesia. Your anesthesia specialist may recommend sedation if you will be having a procedure that does not require you to be unconscious. You may have this procedure for:  Cataract surgery.  A dental procedure.  A biopsy.  A colonoscopy. During the procedure, you may receive a medicine to help you relax (sedative). There are three levels of sedation:  Mild sedation. At this level, you may feel awake and relaxed. You will be able to follow directions.  Moderate sedation. At this level, you will be sleepy. You may not remember the procedure.  Deep sedation. At this level, you will be asleep. You will not remember the procedure. The more medicine you are given, the deeper your level of sedation will be. Depending on how you respond to the procedure, the anesthesia specialist may change your level of sedation or the type of anesthesia to fit your needs. An anesthesia specialist will monitor you closely during the procedure. Tell a health care provider about:  Any allergies you have.  All medicines you are taking, including vitamins, herbs, eye drops, creams, and over-the-counter medicines.  Any problems you or family members have had with anesthetic medicines.  Any blood disorders you have.  Any surgeries you have had.  Any medical conditions you have, such as sleep apnea.  Whether you are pregnant or may be pregnant.  Whether you use cigarettes, alcohol, or drugs.  Any use of steroids, whether by mouth or as a cream. What are the risks? Generally, this is a safe procedure. However, problems may occur, including:  Getting too much medicine (oversedation).  Nausea.  Allergic  reaction to medicines.  Trouble breathing. If this happens, a breathing tube may be used to help with breathing. It will be removed when you are awake and breathing on your own.  Heart trouble.  Lung trouble.  Confusion that gets better with time (emergence delirium). What happens before the procedure? Staying hydrated Follow instructions from your health care provider about hydration, which may include:  Up to 2 hours before the procedure - you may continue to drink clear liquids, such as water, clear fruit juice, black coffee, and plain tea. Eating and drinking restrictions Follow instructions from your health care provider about eating and drinking, which may include:  8 hours before the procedure - stop eating heavy meals or foods, such as meat, fried foods, or fatty foods.  6 hours before the procedure - stop eating light meals or foods, such as toast or cereal.  6 hours before the procedure - stop drinking milk or drinks that contain milk.  2 hours before the procedure - stop drinking clear liquids. Medicines Ask your health care provider about:  Changing or stopping your regular medicines. This is especially important if you are taking diabetes medicines or blood thinners.  Taking medicines such as aspirin and ibuprofen. These medicines can thin your blood. Do not take these medicines unless your health care provider tells you to take them.  Taking over-the-counter medicines, vitamins, herbs, and supplements. Tests and exams  You will have a physical exam.  You may have blood tests done to show: ? How well your kidneys and liver are working. ? How well your blood can clot. General instructions  Plan to have a responsible adult take you home from the hospital or clinic.  If you will be going home right after the procedure, plan to have a responsible adult care for you for the time you are told. This is important. What happens during the procedure?  Your blood  pressure, heart rate, breathing, level of pain, and overall condition will be monitored.  An IV will be inserted into one of your veins.  You will be given medicines as needed to  keep you comfortable during the procedure. This may mean changing the level of sedation. ? Depending on your age or the procedure, the sedative may be given:  As a pill that you will swallow or as a pill that is inserted into the rectum.  As an injection into the vein or muscle.  As a spray through the nose.  The procedure will be performed.  Your breathing, heart rate, and blood pressure will be monitored during the procedure.  When the procedure is over, the medicine will be stopped. The procedure may vary among health care providers and hospitals.   What happens after the procedure?  Your blood pressure, heart rate, breathing rate, and blood oxygen level will be monitored until you leave the hospital or clinic.  You may feel sleepy, clumsy, or nauseous.  You may feel forgetful about what happened after the procedure.  You may vomit.  You may continue to get IV fluids.  Do not drive or operate machinery until your health care provider says that it is safe. Summary  Monitored anesthesia care is used to keep a patient comfortable during short procedures.  Tell your health care provider about any allergies or health conditions you have and about all the medicines you are taking.  Before the procedure, follow instructions about when to stop eating and drinking and about changing or stopping any medicines.  Your blood pressure, heart rate, breathing rate, and blood oxygen level will be monitored until you leave the hospital or clinic.  Plan to have a responsible adult take you home from the hospital or clinic. This information is not intended to replace advice given to you by your health care provider. Make sure you discuss any questions you have with your health care provider. Document Revised:  06/23/2020 Document Reviewed: 09/10/2019 Elsevier Patient Education  2021 [employer].

## 2021-01-06 ENCOUNTER — Encounter (HOSPITAL_COMMUNITY): Payer: Self-pay

## 2021-01-06 ENCOUNTER — Other Ambulatory Visit: Payer: Self-pay

## 2021-01-06 ENCOUNTER — Encounter (HOSPITAL_COMMUNITY)
Admission: RE | Admit: 2021-01-06 | Discharge: 2021-01-06 | Disposition: A | Discharge status: Home / Self Care | Payer: Medicare HMO | Source: Ambulatory Visit | Attending: Internal Medicine | Admitting: Internal Medicine

## 2021-01-06 ENCOUNTER — Other Ambulatory Visit (HOSPITAL_COMMUNITY)
Admission: RE | Admit: 2021-01-06 | Discharge: 2021-01-06 | Disposition: A | Discharge status: Home / Self Care | Payer: Medicare HMO | Source: Ambulatory Visit | Attending: Internal Medicine | Admitting: Internal Medicine

## 2021-01-06 DIAGNOSIS — Z01812 Encounter for preprocedural laboratory examination: Secondary | ICD-10-CM | POA: Insufficient documentation

## 2021-01-06 DIAGNOSIS — Z20822 Contact with and (suspected) exposure to covid-19: Secondary | ICD-10-CM | POA: Insufficient documentation

## 2021-01-06 LAB — BASIC METABOLIC PANEL
Anion gap: 10 (ref 5–15)
BUN: 21 mg/dL — ABNORMAL HIGH (ref 6–20)
CO2: 21 mmol/L — ABNORMAL LOW (ref 22–32)
Calcium: 8.9 mg/dL (ref 8.9–10.3)
Chloride: 110 mmol/L (ref 98–111)
Creatinine, Ser: 0.88 mg/dL (ref 0.44–1.00)
GFR, Estimated: >60 mL/min (ref >60)
Glucose, Bld: 120 mg/dL — ABNORMAL HIGH (ref 70–99)
Potassium: 3.3 mmol/L — ABNORMAL LOW (ref 3.5–5.1)
Sodium: 141 mmol/L (ref 135–145)

## 2021-01-07 LAB — SARS CORONAVIRUS 2 (TAT 6-24 HRS): SARS Coronavirus 2: NEGATIVE

## 2021-01-09 ENCOUNTER — Other Ambulatory Visit (HOSPITAL_COMMUNITY): Payer: Medicare HMO

## 2021-01-09 ENCOUNTER — Other Ambulatory Visit: Payer: Self-pay | Admitting: *Deleted

## 2021-01-09 MED ORDER — PRAMIPEXOLE DIHYDROCHLORIDE 0.125 MG PO TABS: ORAL_TABLET | ORAL | 1 refills | Status: AC

## 2021-01-10 ENCOUNTER — Ambulatory Visit (HOSPITAL_COMMUNITY)
Admission: RE | Admit: 2021-01-10 | Discharge: 2021-01-10 | Disposition: A | Discharge status: Home / Self Care | Payer: Medicare HMO | Attending: Internal Medicine | Admitting: Internal Medicine

## 2021-01-10 ENCOUNTER — Ambulatory Visit (HOSPITAL_COMMUNITY): Payer: Medicare HMO | Admitting: Anesthesiology

## 2021-01-10 ENCOUNTER — Encounter (HOSPITAL_COMMUNITY): Payer: Self-pay

## 2021-01-10 ENCOUNTER — Encounter (HOSPITAL_COMMUNITY)
Admission: RE | Disposition: A | Discharge status: Home / Self Care | Payer: Self-pay | Source: Home / Self Care | Attending: Internal Medicine

## 2021-01-10 DIAGNOSIS — Z923 Personal history of irradiation: Secondary | ICD-10-CM | POA: Diagnosis not present

## 2021-01-10 DIAGNOSIS — K648 Other hemorrhoids: Secondary | ICD-10-CM | POA: Diagnosis not present

## 2021-01-10 DIAGNOSIS — Z881 Allergy status to other antibiotic agents status: Secondary | ICD-10-CM | POA: Insufficient documentation

## 2021-01-10 DIAGNOSIS — R110 Nausea: Secondary | ICD-10-CM | POA: Diagnosis not present

## 2021-01-10 DIAGNOSIS — R634 Abnormal weight loss: Secondary | ICD-10-CM | POA: Insufficient documentation

## 2021-01-10 DIAGNOSIS — Z832 Family history of diseases of the blood and blood-forming organs and certain disorders involving the immune mechanism: Secondary | ICD-10-CM | POA: Diagnosis not present

## 2021-01-10 DIAGNOSIS — Z8249 Family history of ischemic heart disease and other diseases of the circulatory system: Secondary | ICD-10-CM | POA: Diagnosis not present

## 2021-01-10 DIAGNOSIS — Z8673 Personal history of transient ischemic attack (TIA), and cerebral infarction without residual deficits: Secondary | ICD-10-CM | POA: Diagnosis not present

## 2021-01-10 DIAGNOSIS — Z888 Allergy status to other drugs, medicaments and biological substances status: Secondary | ICD-10-CM | POA: Insufficient documentation

## 2021-01-10 DIAGNOSIS — Z818 Family history of other mental and behavioral disorders: Secondary | ICD-10-CM | POA: Insufficient documentation

## 2021-01-10 DIAGNOSIS — Z6834 Body mass index (BMI) 34.0-34.9, adult: Secondary | ICD-10-CM | POA: Diagnosis not present

## 2021-01-10 DIAGNOSIS — K635 Polyp of colon: Secondary | ICD-10-CM

## 2021-01-10 DIAGNOSIS — Z853 Personal history of malignant neoplasm of breast: Secondary | ICD-10-CM | POA: Diagnosis not present

## 2021-01-10 DIAGNOSIS — K2970 Gastritis, unspecified, without bleeding: Secondary | ICD-10-CM | POA: Diagnosis not present

## 2021-01-10 DIAGNOSIS — R1013 Epigastric pain: Secondary | ICD-10-CM | POA: Insufficient documentation

## 2021-01-10 DIAGNOSIS — K529 Noninfective gastroenteritis and colitis, unspecified: Secondary | ICD-10-CM | POA: Insufficient documentation

## 2021-01-10 DIAGNOSIS — Z807 Family history of other malignant neoplasms of lymphoid, hematopoietic and related tissues: Secondary | ICD-10-CM | POA: Insufficient documentation

## 2021-01-10 DIAGNOSIS — Z905 Acquired absence of kidney: Secondary | ICD-10-CM | POA: Insufficient documentation

## 2021-01-10 DIAGNOSIS — K5900 Constipation, unspecified: Secondary | ICD-10-CM | POA: Insufficient documentation

## 2021-01-10 DIAGNOSIS — Z820 Family history of epilepsy and other diseases of the nervous system: Secondary | ICD-10-CM | POA: Insufficient documentation

## 2021-01-10 DIAGNOSIS — K2950 Unspecified chronic gastritis without bleeding: Secondary | ICD-10-CM | POA: Insufficient documentation

## 2021-01-10 DIAGNOSIS — Z800 Family history of malignant neoplasm of digestive organs: Secondary | ICD-10-CM | POA: Insufficient documentation

## 2021-01-10 DIAGNOSIS — K5730 Diverticulosis of large intestine without perforation or abscess without bleeding: Secondary | ICD-10-CM | POA: Insufficient documentation

## 2021-01-10 DIAGNOSIS — Z825 Family history of asthma and other chronic lower respiratory diseases: Secondary | ICD-10-CM | POA: Insufficient documentation

## 2021-01-10 DIAGNOSIS — Z808 Family history of malignant neoplasm of other organs or systems: Secondary | ICD-10-CM | POA: Insufficient documentation

## 2021-01-10 DIAGNOSIS — Z840 Family history of diseases of the skin and subcutaneous tissue: Secondary | ICD-10-CM | POA: Insufficient documentation

## 2021-01-10 DIAGNOSIS — Z803 Family history of malignant neoplasm of breast: Secondary | ICD-10-CM | POA: Insufficient documentation

## 2021-01-10 DIAGNOSIS — Z79899 Other long term (current) drug therapy: Secondary | ICD-10-CM | POA: Insufficient documentation

## 2021-01-10 DIAGNOSIS — R1031 Right lower quadrant pain: Secondary | ICD-10-CM | POA: Insufficient documentation

## 2021-01-10 DIAGNOSIS — R1032 Left lower quadrant pain: Secondary | ICD-10-CM | POA: Insufficient documentation

## 2021-01-10 HISTORY — PX: COLONOSCOPY WITH PROPOFOL: SHX5780

## 2021-01-10 HISTORY — PX: ESOPHAGOGASTRODUODENOSCOPY (EGD) WITH PROPOFOL: SHX5813

## 2021-01-10 HISTORY — PX: BIOPSY: SHX5522

## 2021-01-10 HISTORY — PX: POLYPECTOMY: SHX5525

## 2021-01-10 SURGERY — COLONOSCOPY WITH PROPOFOL
Anesthesia: General

## 2021-01-10 MED ORDER — STERILE WATER FOR IRRIGATION IR SOLN
Status: DC | PRN
  Administered 2021-01-10: 1.5 mL

## 2021-01-10 MED ORDER — LACTATED RINGERS IV SOLN: INTRAVENOUS | Status: DC

## 2021-01-10 MED ORDER — PROPOFOL 500 MG/50ML IV EMUL
INTRAVENOUS | Status: DC | PRN
  Administered 2021-01-10: 150 ug/kg/min via INTRAVENOUS

## 2021-01-10 MED ORDER — LIDOCAINE HCL (CARDIAC) PF 100 MG/5ML IV SOSY
PREFILLED_SYRINGE | INTRAVENOUS | Status: DC | PRN
  Administered 2021-01-10: 50 mg via INTRAVENOUS

## 2021-01-10 MED ORDER — PROPOFOL 10 MG/ML IV BOLUS
INTRAVENOUS | Status: DC | PRN
  Administered 2021-01-10: 50 mg via INTRAVENOUS
  Administered 2021-01-10: 30 mg via INTRAVENOUS
  Administered 2021-01-10: 100 mg via INTRAVENOUS

## 2021-01-10 NOTE — Op Note (Signed)
[facility] Patient Name: Sarah Cooley Procedure Date: 01/10/2021 1:33 PM MRN: [id number] Date of Birth: September 17, 1966 Attending MD: Elon Alas. Abbey Chatters DO CSN: [id number] Age: 55 Admit Type: Outpatient Procedure:                Colonoscopy Indications:              Abdominal pain in the left lower quadrant, Chronic                            diarrhea, Constipation Providers:                Elon Alas. Abbey Chatters, DO, Caprice Kluver, Randa Spike,                            Technician Referring MD:              Medicines:                See the Anesthesia note for documentation of the                            administered medications Complications:            No immediate complications. Estimated Blood Loss:     Estimated blood loss was minimal. Procedure:                Pre-Anesthesia Assessment:                           - The anesthesia plan was to use monitored                            anesthesia care (MAC).                           After obtaining informed consent, the colonoscope                            was passed under direct vision. Throughout the                            procedure, the patient's blood pressure, pulse, and                            oxygen saturations were monitored continuously. The                            [id number]-HQ190L ([id number]) scope was introduced through                            the anus and advanced to the the cecum, identified                            by appendiceal orifice and ileocecal valve. The                            colonoscopy was performed without difficulty. The  patient tolerated the procedure well. The quality                            of the bowel preparation was evaluated using the                            BBPS [facility] Bowel Preparation Scale) with scores                            of: Right Colon = 2 (minor amount of residual                            staining, small fragments of stool and/or  opaque                            liquid, but mucosa seen well), Transverse Colon = 3                            (entire mucosa seen well with no residual staining,                            small fragments of stool or opaque liquid) and Left                            Colon = 3 (entire mucosa seen well with no residual                            staining, small fragments of stool or opaque                            liquid). The total BBPS score equals 8. The quality                            of the bowel preparation was good. Scope In: 1:36:56 PM Scope Out: 1:47:29 PM Scope Withdrawal Time: 0 hours 7 minutes 51 seconds  Total Procedure Duration: 0 hours 10 minutes 33 seconds  Findings:      The perianal and digital rectal examinations were normal.      Non-bleeding internal hemorrhoids were found during endoscopy.      Multiple small-mouthed diverticula were found in the sigmoid colon and       descending colon.      Three sessile polyps were found in the sigmoid colon. The polyps were 4       to 6 mm in size. These polyps were removed with a cold snare. Resection       and retrieval were complete. Impression:               - Non-bleeding internal hemorrhoids.                           - Diverticulosis in the sigmoid colon and in the  descending colon.                           - Three 4 to 6 mm polyps in the sigmoid colon,                            removed with a cold snare. Resected and retrieved. Moderate Sedation:      Per Anesthesia Care Recommendation:           - Patient has a contact number available for                            emergencies. The signs and symptoms of potential                            delayed complications were discussed with the                            patient. Return to normal activities tomorrow.                            Written discharge instructions were provided to the                            patient.                            - Resume previous diet.                           - Continue present medications.                           - Await pathology results.                           - Repeat colonoscopy in 5-10 years for surveillance                            based on pathology results.                           - Return to GI clinic in 3 months. Procedure Code(s):        --- Professional ---                           [phone], Colonoscopy, flexible; with removal of                            tumor(s), polyp(s), or other lesion(s) by snare                            technique Diagnosis Code(s):        --- Professional ---                           A63.0,  Other hemorrhoids                           K63.5, Polyp of colon                           R10.32, Left lower quadrant pain                           K52.9, Noninfective gastroenteritis and colitis,                            unspecified                           K59.00, Constipation, unspecified                           K57.30, Diverticulosis of large intestine without                            perforation or abscess without bleeding CPT copyright 2019 American Medical Association. All rights reserved. The codes documented in this report are preliminary and upon coder review may  be revised to meet current compliance requirements. Elon Alas. Abbey Chatters, DO Rupert Abbey Chatters, DO 01/10/2021 1:56:12 PM This report has been signed electronically. Number of Addenda: 0

## 2021-01-10 NOTE — Transfer of Care (Signed)
Immediate Anesthesia Transfer of Care Note  Patient: Sarah Cooley  Procedure(s) Performed: COLONOSCOPY WITH PROPOFOL (N/A ) ESOPHAGOGASTRODUODENOSCOPY (EGD) WITH PROPOFOL (N/A ) BIOPSY POLYPECTOMY  Patient Location: PACU  Anesthesia Type:General  Level of Consciousness: awake and oriented  Airway & Oxygen Therapy: Patient Spontanous Breathing  Post-op Assessment: Report given to RN and Post -op Vital signs reviewed and stable  Post vital signs: Reviewed and stable  Last Vitals:  Vitals Value Taken Time  BP 98/68 01/10/21 1353  Temp    Pulse 79 01/10/21 1354  Resp 15 01/10/21 1354  SpO2 98 % 01/10/21 1354  Vitals shown include unvalidated device data.  Last Pain:  Vitals:   01/10/21 1322  TempSrc:   PainSc: 0-No pain      Patients Stated Pain Goal: 7 (75/10/25 8527)  Complications: No complications documented.

## 2021-01-10 NOTE — Anesthesia Preprocedure Evaluation (Signed)
Anesthesia Evaluation  Patient identified by MRN, date of birth, ID band Patient awake    Reviewed: Allergy & Precautions, H&P , NPO status , Patient's Chart, lab work & pertinent test results, reviewed documented beta blocker date and time   History of Anesthesia Complications (+) PONV and history of anesthetic complications  Airway Mallampati: II  TM Distance: >3 FB Neck ROM: full    Dental no notable dental hx.    Pulmonary sleep apnea ,    Pulmonary exam normal breath sounds clear to auscultation       Cardiovascular Exercise Tolerance: Good hypertension, negative cardio ROS   Rhythm:regular Rate:Normal     Neuro/Psych  Headaches, PSYCHIATRIC DISORDERS Anxiety Depression TIA Neuromuscular disease    GI/Hepatic Neg liver ROS, hiatal hernia, GERD  Medicated,  Endo/Other  negative endocrine ROS  Renal/GU CRFRenal disease  negative genitourinary   Musculoskeletal   Abdominal   Peds  Hematology negative hematology ROS (+)   Anesthesia Other Findings   Reproductive/Obstetrics negative OB ROS                             Anesthesia Physical Anesthesia Plan  ASA: III  Anesthesia Plan: General   Post-op Pain Management:    Induction:   PONV Risk Score and Plan: Propofol infusion  Airway Management Planned:   Additional Equipment:   Intra-op Plan:   Post-operative Plan:   Informed Consent: I have reviewed the patients History and Physical, chart, labs and discussed the procedure including the risks, benefits and alternatives for the proposed anesthesia with the patient or authorized representative who has indicated his/her understanding and acceptance.     Dental Advisory Given  Plan Discussed with: CRNA  Anesthesia Plan Comments:         Anesthesia Quick Evaluation

## 2021-01-10 NOTE — H&P (Signed)
Primary Care Physician:  Pablo Lawrence, NP Primary Gastroenterologist:  Dr. Abbey Chatters  Pre-Procedure History & Physical: HPI:  Sarah Cooley is a 55 y.o. female is here for an EGD and colonoscopy to be performed alternating constipation and diarrhea, RUQ abdominal pain, nausea, and weight loss  Past Medical History:  Diagnosis Date  . Allergy    seasonal  . Anxiety   . Arthritis   . Balance problem   . Brain aneurysm    2 mm ACA region aneurysm by 09/21/15 MRA  . Breast cancer ([place])   . Breast cancer of lower-inner quadrant of right female breast ([place]) 08/26/2015  . Chronic kidney disease    acute renal failure one occasion  . Chronic low back pain 12/02/2018  . Complication of anesthesia    heart rate drops and O2 sats drop  . Constipation   . Depression   . Diplopia 10/29/2017  . Dizziness 10/29/2017  . Elevated liver function tests   . Family history of adverse reaction to anesthesia    mom has n/v  . Family history of breast cancer   . Family history of colon cancer   . Fibromyalgia   . Fibromyalgia   . Gait abnormality 10/29/2017  . Genetic testing 09/08/2015   Negative genetic testing on the Breast/High Moderate Risk panel. The Breast High/Moderate Risk gene panel offered by [facility] includes sequencing and deletion/duplication analysis of the following 9 genes: ATM, BRCA1, BRCA2, CDH1, CHEK2, PALB2, PTEN, STK11, and TP53. The report date is September 08, 2015.  The Rest of the Comprehensive Cancer panel has been reflexed and will be available in 2-3 weeks.  POLD1 c.208G>T VUS found on the Remainder of the Comprehensive cancer panel.  The Comprehensive Cancer Panel offered by [facility] includes sequencing and/or deletion duplication testing of the following 32 genes: APC, ATM, AXIN2, BARD1, BMPR1A, BRCA1, BRCA2, BRIP1, CDH1, CDK4, CDKN2A, CHEK2, EPCAM, FANCC, MLH1, MSH2, MSH6, MUTYH, NBN, PALB2, PMS2, POLD1, POLE, PTEN, RAD51C, RAD51D, SCG5/GREM1, SMAD4, STK11, TP53, VHL, and XRCC2.    The report date is 09/12/2015.   Marland Kitchen GERD (gastroesophageal reflux disease)    none recent  . H/O acute renal failure 09/22/15   admitted to [facility]  . Headache    occipital and temporal  . History of hiatal hernia   . Hyperlipemia   . Hyperlipidemia   . Hypertension   . Irritable bowel syndrome (IBS)   . Lyme disease   . MGUS (monoclonal gammopathy of unknown significance) 03/15/2016  . Neuromuscular disorder (HCC)    fibromyalgia  . Nocturnal myoclonus   . Occasional tremors   . OSA (obstructive sleep apnea)    uses cpap with humidification- auto   . Personal history of radiation therapy   . PONV (postoperative nausea and vomiting)   . Renal cell carcinoma ([place])   . TIA (transient ischemic attack) 2021  . Tremor, essential 10/29/2017    Past Surgical History:  Procedure Laterality Date  . ABDOMINAL HYSTERECTOMY     adenomyosis  . ABDOMINAL HYSTERECTOMY    . BLADDER SURGERY    . BONE GRAFT HIP ILIAC CREST    . BREAST BIOPSY Bilateral   . BREAST LUMPECTOMY    . BREAST SURGERY    . CHOLECYSTECTOMY    . COLONOSCOPY WITH PROPOFOL  05/14/2016   [facility]; entire examined colon normal.  Recommendations to repeat colonoscopy in 10 years for screening purposes.  . ESOPHAGOGASTRODUODENOSCOPY (EGD) WITH PROPOFOL  03/24/2018   [facility]; normal esophagus,  erythematous mucosa in the gastric body and antrum s/p biopsy, normal examined duodenum s/p biopsy.  Pathology with benign duodenal biopsy.  Stomach biopsies with chronic inactive gastritis, negative for H. pylori.  . FRACTURE SURGERY Right    wrist  . KNEE ARTHROSCOPY    . KNEE ARTHROSCOPY W/ ACL RECONSTRUCTION     right  . RADIOACTIVE SEED GUIDED PARTIAL MASTECTOMY WITH AXILLARY SENTINEL LYMPH NODE BIOPSY Right 10/13/2015   Procedure: RADIOACTIVE SEED GUIDED PARTIAL MASTECTOMY WITH AXILLARY SENTINEL LYMPH NODE BIOPSY;  Surgeon: Erroll Luna, MD;  Location: [facility];  Service: General;  Laterality: Right;  . right carpal and  cubital tunnel release     . ROBOTIC ASSITED PARTIAL NEPHRECTOMY Left 06/19/2018   Procedure: XI ROBOTIC ASSITED PARTIAL NEPHRECTOMY;  Surgeon: Ardis Hughs, MD;  Location: [facility];  Service: Urology;  Laterality: Left;  . WRIST SURGERY      Prior to Admission medications   Medication Sig Start Date End Date Taking? Authorizing Provider  ALPRAZolam Duanne Moron) 1 MG tablet Take 1 mg by mouth at bedtime.   Yes [provider]  diltiazem (CARDIZEM CD) 180 MG 24 hr capsule TAKE ONE CAPSULE BY MOUTH DAILY Patient taking differently: Take 180 mg by mouth daily. 10/26/20  Yes BranchAlphonse Guild, MD  FLUoxetine (PROZAC) 20 MG capsule Take 60 mg by mouth daily. 06/19/18  Yes [provider]  furosemide (LASIX) 20 MG tablet Take 20 mg by mouth daily as needed for edema.   Yes [provider]  HYDROcodone-acetaminophen (NORCO) 7.5-325 MG tablet Take 0.5 tablets by mouth 3 (three) times daily as needed for severe pain (Takes 0.5 tablet  at bedtime). 11/21/18  Yes [provider]  linaclotide (LINZESS) 72 MCG capsule Take 72 mcg by mouth daily before breakfast.   Yes [provider]  losartan (COZAAR) 25 MG tablet Take 25 mg by mouth daily. 06/10/18  Yes [provider]  ondansetron (ZOFRAN) 4 MG tablet Take 1 tablet (4 mg total) by mouth every 8 (eight) hours as needed for nausea or vomiting. 12/12/20  Yes Jodi Mourning, Kristen S, PA-C  OVER THE COUNTER MEDICATION Take 1 capsule by mouth daily as needed (nausea). Diphenhydramate Over The Counter Anti-emetic   Yes [provider]  pantoprazole (PROTONIX) 40 MG tablet Take 1 tablet (40 mg total) by mouth 2 (two) times daily. 12/12/20  Yes Harper, Kristen S, PA-C  pramipexole (MIRAPEX) 0.125 MG tablet TAKE ONE TAB BY MOUTH DAILY AT DINNERTIME AND TAKE TWO TABS BY MOUTH AT BEDTIME. PLEASE CALL [phone] TO SCHEDULE APPT. 01/09/21  Yes Suzzanne Cloud, NP  pravastatin (PRAVACHOL) 10 MG tablet Take 10 mg by mouth  daily.   Yes [provider]  propranolol (INDERAL) 60 MG tablet Take 60 mg by mouth daily.   Yes [provider]  topiramate (TOPAMAX) 50 MG tablet Take 2 in the morning, take 3 at night Patient taking differently: Take 100-150 mg by mouth See admin instructions. Take 100 mg in the morning, take 150 mg at night 06/01/20  Yes Suzzanne Cloud, NP  Vitamin D, Ergocalciferol, (DRISDOL) 50000 units CAPS capsule Take 50,000 Units by mouth every 7 (seven) days. Take 1 tablet (50,000 units) by mouth every Thursday   Yes [provider]  Wheat Dextrin (BENEFIBER PO) Take 1 Scoop by mouth daily.   Yes [provider]    Allergies as of 12/12/2020 - Review Complete 12/12/2020  Allergen Reaction Noted  . Ramipril Nausea And Vomiting and  Other (See Comments) 02/12/2012  . Minocycline hcl Hives 02/12/2012  . Other Rash 10/11/2015  . Altace [ramipril] Nausea And Vomiting and Nausea Only 01/18/2018  . Lyrica [pregabalin] Nausea And Vomiting 01/18/2018  . Adhesive [tape] Rash 10/11/2015  . Bactrim [sulfamethoxazole-trimethoprim] Other (See Comments) 10/11/2015  . Drixoral [brompheniramine-pseudoeph] Rash 02/12/2012  . Erythromycin Other (See Comments) 02/12/2012  . Minocin [minocycline hcl] Nausea And Vomiting and Rash 01/18/2018  . Septra [sulfamethoxazole-trimethoprim] Rash 01/18/2018  . Sinutab [chlorphen-pseudoephed-apap] Rash 02/12/2012    Family History  Problem Relation Age of Onset  . Hypertension Mother   . Autoimmune disease Mother        lichen planus  . Eczema Mother   . Parkinson's disease Mother   . Mental illness Mother        bipolar  . Heart disease Maternal Aunt   . Lung cancer Paternal Aunt   . Breast cancer Paternal Aunt        dx in his 27s  . Parkinsonism Paternal Aunt   . Lung cancer Paternal Grandfather   . Colon cancer Paternal Uncle        dx in his 96s  . Colon cancer Paternal Uncle        dx in his 57s  . Melanoma Father 44  .  Psoriasis Father   . Hypertension Father   . Heart disease Father   . Breast cancer Sister        dx in her 51s  . Lupus Sister   . COPD Maternal Uncle   . Skin cancer Maternal Uncle   . Colon cancer Paternal Uncle        dx in his 32s  . Mental illness Son        bipolar  . Polycystic ovary syndrome Other     Social History   Socioeconomic History  . Marital status: Married    Spouse name: Psychologist, clinical  . Number of children: 2  . Years of education: 3  . Highest education level: Not on file  Occupational History  . Occupation: physician office  Tobacco Use  . Smoking status: Never Smoker  . Smokeless tobacco: Never Used  Vaping Use  . Vaping Use: Never used  Substance and Sexual Activity  . Alcohol use: Not Currently    Comment: maybe one a year  . Drug use: Never  . Sexual activity: Yes    Birth control/protection: Surgical  Other Topics Concern  . Not on file  Social History Narrative   ** Merged History Encounter **       Lives with husband Husband on disability Children grown and moved worries about family illness- mother and sister Caffeine use:     Social Determinants of Health   Financial Resource Strain: Not on file  Food Insecurity: Not on file  Transportation Needs: Not on file  Physical Activity: Not on file  Stress: Not on file  Social Connections: Not on file  Intimate Partner Violence: Not on file    Review of Systems: See HPI, otherwise negative ROS  Physical Exam: Vital signs in last 24 hours: Temp:  [98.5 F (36.9 C)] 98.5 F (36.9 C) (03/22 1306) Pulse Rate:  [76] 76 (03/22 1306) Resp:  [14] 14 (03/22 1306) BP: (137)/(87) 137/87 (03/22 1306) SpO2:  [96 %] 96 % (03/22 1306) Weight:  [101.6 kg] 101.6 kg (03/22 1306)   General:   Alert,  Well-developed, well-nourished, pleasant and cooperative in NAD Head:  Normocephalic and atraumatic. Eyes:  Sclera clear, no icterus.   Conjunctiva pink. Ears:  Normal auditory acuity. Nose:  No  deformity, discharge,  or lesions. Mouth:  No deformity or lesions, dentition normal. Neck:  Supple; no masses or thyromegaly. Lungs:  Clear throughout to auscultation.   No wheezes, crackles, or rhonchi. No acute distress. Heart:  Regular rate and rhythm; no murmurs, clicks, rubs,  or gallops. Abdomen:  Soft, nontender and nondistended. No masses, hepatosplenomegaly or hernias noted. Normal bowel sounds, without guarding, and without rebound.   Msk:  Symmetrical without gross deformities. Normal posture. Extremities:  Without clubbing or edema. Neurologic:  Alert and  oriented x4;  grossly normal neurologically. Skin:  Intact without significant lesions or rashes. Cervical Nodes:  No significant cervical adenopathy. Psych:  Alert and cooperative. Normal mood and affect.  Impression/Plan: Sander Radon is here for an EGD and colonoscopy to be performed alternating constipation and diarrhea, RUQ abdominal pain, nausea, and weight loss  The risks of the procedure including infection, bleed, or perforation as well as benefits, limitations, alternatives and imponderables have been reviewed with the patient. Questions have been answered. All parties agreeable.

## 2021-01-10 NOTE — Anesthesia Postprocedure Evaluation (Signed)
Anesthesia Post Note  Patient: Sarah Cooley  Procedure(s) Performed: COLONOSCOPY WITH PROPOFOL (N/A ) ESOPHAGOGASTRODUODENOSCOPY (EGD) WITH PROPOFOL (N/A ) BIOPSY POLYPECTOMY  Anesthesia Type: General Anesthetic complications: no   No complications documented.   Last Vitals:  Vitals:   01/10/21 1415 01/10/21 1421  BP: 119/77 127/80  Pulse: 71 69  Resp: 14 15  Temp:  36.6 C  SpO2: 96% 97%    Last Pain:  Vitals:   01/10/21 1421  TempSrc: Oral  PainSc: 0-No pain                 Louann Sjogren

## 2021-01-10 NOTE — Anesthesia Procedure Notes (Signed)
Date/Time: 01/10/2021 1:33 PM Performed by: Orlie Dakin, CRNA Pre-anesthesia Checklist: Patient identified, Emergency Drugs available, Suction available and Patient being monitored Patient Re-evaluated:Patient Re-evaluated prior to induction Oxygen Delivery Method: Nasal cannula Induction Type: IV induction Placement Confirmation: positive ETCO2

## 2021-01-10 NOTE — Discharge Instructions (Addendum)
EGD Discharge instructions Please read the instructions outlined below and refer to this sheet in the next few weeks. These discharge instructions provide you with general information on caring for yourself after you leave the hospital. Your doctor may also give you specific instructions. While your treatment has been planned according to the most current medical practices available, unavoidable complications occasionally occur. If you have any problems or questions after discharge, please call your doctor. ACTIVITY  You may resume your regular activity but move at a slower pace for the next 24 hours.   Take frequent rest periods for the next 24 hours.   Walking will help expel (get rid of) the air and reduce the bloated feeling in your abdomen.   No driving for 24 hours (because of the anesthesia (medicine) used during the test).   You may shower.   Do not sign any important legal documents or operate any machinery for 24 hours (because of the anesthesia used during the test).  NUTRITION  Drink plenty of fluids.   You may resume your normal diet.   Begin with a light meal and progress to your normal diet.   Avoid alcoholic beverages for 24 hours or as instructed by your caregiver.  MEDICATIONS  You may resume your normal medications unless your caregiver tells you otherwise.  WHAT YOU CAN EXPECT TODAY  You may experience abdominal discomfort such as a feeling of fullness or "gas" pains.  FOLLOW-UP  Your doctor will discuss the results of your test with you.  SEEK IMMEDIATE MEDICAL ATTENTION IF ANY OF THE FOLLOWING OCCUR:  Excessive nausea (feeling sick to your stomach) and/or vomiting.   Severe abdominal pain and distention (swelling).   Trouble swallowing.   Temperature over 101 F (37.8 C).   Rectal bleeding or vomiting of blood.     Colonoscopy Discharge Instructions  Read the instructions outlined below and refer to this sheet in the next few weeks. These  discharge instructions provide you with general information on caring for yourself after you leave the hospital. Your doctor may also give you specific instructions. While your treatment has been planned according to the most current medical practices available, unavoidable complications occasionally occur.   ACTIVITY  You may resume your regular activity, but move at a slower pace for the next 24 hours.   Take frequent rest periods for the next 24 hours.   Walking will help get rid of the air and reduce the bloated feeling in your belly (abdomen).   No driving for 24 hours (because of the medicine (anesthesia) used during the test).    Do not sign any important legal documents or operate any machinery for 24 hours (because of the anesthesia used during the test).  NUTRITION  Drink plenty of fluids.   You may resume your normal diet as instructed by your doctor.   Begin with a light meal and progress to your normal diet. Heavy or fried foods are harder to digest and may make you feel sick to your stomach (nauseated).   Avoid alcoholic beverages for 24 hours or as instructed.  MEDICATIONS  You may resume your normal medications unless your doctor tells you otherwise.  WHAT YOU CAN EXPECT TODAY  Some feelings of bloating in the abdomen.   Passage of more gas than usual.   Spotting of blood in your stool or on the toilet paper.  IF YOU HAD POLYPS REMOVED DURING THE COLONOSCOPY:  No aspirin products for 7 days or as instructed.  No alcohol for 7 days or as instructed.   Eat a soft diet for the next 24 hours.  FINDING OUT THE RESULTS OF YOUR TEST Not all test results are available during your visit. If your test results are not back during the visit, make an appointment with your caregiver to find out the results. Do not assume everything is normal if you have not heard from your caregiver or the medical facility. It is important for you to follow up on all of your test results.   SEEK IMMEDIATE MEDICAL ATTENTION IF:  You have more than a spotting of blood in your stool.   Your belly is swollen (abdominal distention).   You are nauseated or vomiting.   You have a temperature over 101.   You have abdominal pain or discomfort that is severe or gets worse throughout the day.   Your EGD revealed a mild amount inflammation in your stomach.  I took biopsies of this to rule out infection with a bacteria called H. pylori.  I also took duodenal biopsies to rule out celiac sprue.  Await pathology results, my office will contact you.  Continue on pantoprazole.  Avoid NSAIDs as best as you can.  Your colonoscopy revealed 3 polyps which I removed successfully.  Unsure if these are benign hyperplastic or precancerous adenomas.  Pathology will determine when we need to repeat colonoscopy.  We will call you later this week with these results.  Follow up with GI in 3 months.   I hope you have a great rest of your week!  Elon Alas. Abbey Chatters, D.O. Gastroenterology and Hepatology [facility]

## 2021-01-10 NOTE — Op Note (Signed)
[facility] Patient Name: Sarah Cooley Procedure Date: 01/10/2021 1:16 PM MRN: [id number] Date of Birth: 10/19/1966 Attending MD: Elon Alas. Abbey Chatters DO CSN: [id number] Age: 55 Admit Type: Outpatient Procedure:                Upper GI endoscopy Indications:              Epigastric abdominal pain, Abdominal pain in the                            right lower quadrant, Nausea with vomiting Providers:                Elon Alas. Abbey Chatters, DO, Caprice Kluver, Randa Spike,                            Technician Referring MD:              Medicines:                See the Anesthesia note for documentation of the                            administered medications Complications:            No immediate complications. Estimated Blood Loss:     Estimated blood loss was minimal. Procedure:                Pre-Anesthesia Assessment:                           - The anesthesia plan was to use monitored                            anesthesia care (MAC).                           After obtaining informed consent, the endoscope was                            passed under direct vision. Throughout the                            procedure, the patient's blood pressure, pulse, and                            oxygen saturations were monitored continuously. The                            GIF-H190 ([id number]) was introduced through the                            mouth, and advanced to the second part of duodenum.                            The upper GI endoscopy was accomplished without                            difficulty. The patient  tolerated the procedure                            well. Scope In: 1:28:38 PM Scope Out: 1:31:37 PM Total Procedure Duration: 0 hours 2 minutes 59 seconds  Findings:      The Z-line was regular and was found 38 cm from the incisors.      Diffuse mild inflammation characterized by erythema was found in the       entire examined stomach. Biopsies were taken with a cold forceps  for       Helicobacter pylori testing.      The duodenal bulb, first portion of the duodenum and second portion of       the duodenum were normal. Biopsies for histology were taken with a cold       forceps for evaluation of celiac disease. Impression:               - Z-line regular, 38 cm from the incisors.                           - Gastritis. Biopsied.                           - Normal duodenal bulb, first portion of the                            duodenum and second portion of the duodenum.                            Biopsied. Moderate Sedation:      Per Anesthesia Care Recommendation:           - Patient has a contact number available for                            emergencies. The signs and symptoms of potential                            delayed complications were discussed with the                            patient. Return to normal activities tomorrow.                            Written discharge instructions were provided to the                            patient.                           - Resume previous diet.                           - Continue present medications.                           - Await pathology results.                           -  Use a proton pump inhibitor PO BID.                           - No ibuprofen, naproxen, or other non-steroidal                            anti-inflammatory drugs. Procedure Code(s):        --- Professional ---                           [phone], Esophagogastroduodenoscopy, flexible,                            transoral; with biopsy, single or multiple Diagnosis Code(s):        --- Professional ---                           K29.70, Gastritis, unspecified, without bleeding                           R10.13, Epigastric pain                           R10.31, Right lower quadrant pain                           R11.2, Nausea with vomiting, unspecified CPT copyright 2019 [employer] Association. All rights reserved. The codes  documented in this report are preliminary and upon coder review may  be revised to meet current compliance requirements. Elon Alas. Abbey Chatters, DO [place] Abbey Chatters, DO 01/10/2021 1:33:25 PM This report has been signed electronically. Number of Addenda: 0

## 2021-01-11 ENCOUNTER — Encounter (HOSPITAL_COMMUNITY)
Admission: RE | Admit: 2021-01-11 | Discharge: 2021-01-11 | Disposition: A | Discharge status: Home / Self Care | Payer: Medicare HMO | Source: Ambulatory Visit | Attending: Oncology | Admitting: Oncology

## 2021-01-11 DIAGNOSIS — D472 Monoclonal gammopathy: Secondary | ICD-10-CM | POA: Diagnosis not present

## 2021-01-11 MED ORDER — TECHNETIUM TC 99M MEDRONATE IV KIT
20.0000 | PACK | Freq: Once | INTRAVENOUS | Status: AC | PRN
  Administered 2021-01-11: 21.5 via INTRAVENOUS

## 2021-01-12 LAB — SURGICAL PATHOLOGY

## 2021-01-16 ENCOUNTER — Other Ambulatory Visit: Payer: Self-pay | Admitting: Oncology

## 2021-01-16 ENCOUNTER — Other Ambulatory Visit (HOSPITAL_COMMUNITY): Payer: Self-pay | Admitting: Surgery

## 2021-01-16 ENCOUNTER — Other Ambulatory Visit: Payer: Self-pay

## 2021-01-16 ENCOUNTER — Inpatient Hospital Stay (HOSPITAL_COMMUNITY): Payer: Medicare HMO | Attending: Hematology

## 2021-01-16 DIAGNOSIS — D472 Monoclonal gammopathy: Secondary | ICD-10-CM | POA: Insufficient documentation

## 2021-01-16 DIAGNOSIS — E538 Deficiency of other specified B group vitamins: Secondary | ICD-10-CM

## 2021-01-16 DIAGNOSIS — Z171 Estrogen receptor negative status [ER-]: Secondary | ICD-10-CM

## 2021-01-16 DIAGNOSIS — C50311 Malignant neoplasm of lower-inner quadrant of right female breast: Secondary | ICD-10-CM

## 2021-01-16 LAB — COMPREHENSIVE METABOLIC PANEL
ALT: 25 U/L (ref 0–44)
AST: 21 U/L (ref 15–41)
Albumin: 3.9 g/dL (ref 3.5–5.0)
Alkaline Phosphatase: 59 U/L (ref 38–126)
Anion gap: 10 (ref 5–15)
BUN: 20 mg/dL (ref 6–20)
CO2: 18 mmol/L — ABNORMAL LOW (ref 22–32)
Calcium: 9 mg/dL (ref 8.9–10.3)
Chloride: 107 mmol/L (ref 98–111)
Creatinine, Ser: 0.85 mg/dL (ref 0.44–1.00)
GFR, Estimated: >60 mL/min (ref >60)
Glucose, Bld: 103 mg/dL — ABNORMAL HIGH (ref 70–99)
Potassium: 3.6 mmol/L (ref 3.5–5.1)
Sodium: 135 mmol/L (ref 135–145)
Total Bilirubin: 0.3 mg/dL (ref 0.3–1.2)
Total Protein: 7.2 g/dL (ref 6.5–8.1)

## 2021-01-16 LAB — CBC WITH DIFFERENTIAL/PLATELET
Abs Immature Granulocytes: 0.01 10*3/uL (ref 0.00–0.07)
Basophils Absolute: 0 10*3/uL (ref 0.0–0.1)
Basophils Relative: 1 %
Eosinophils Absolute: 0.2 10*3/uL (ref 0.0–0.5)
Eosinophils Relative: 4 %
HCT: 39.3 % (ref 36.0–46.0)
Hemoglobin: 12.9 g/dL (ref 12.0–15.0)
Immature Granulocytes: 0 %
Lymphocytes Relative: 33 %
Lymphs Abs: 2 10*3/uL (ref 0.7–4.0)
MCH: 30.9 pg (ref 26.0–34.0)
MCHC: 32.8 g/dL (ref 30.0–36.0)
MCV: 94.2 fL (ref 80.0–100.0)
Monocytes Absolute: 0.6 10*3/uL (ref 0.1–1.0)
Monocytes Relative: 10 %
Neutro Abs: 3.1 10*3/uL (ref 1.7–7.7)
Neutrophils Relative %: 52 %
Platelets: 188 10*3/uL (ref 150–400)
RBC: 4.17 MIL/uL (ref 3.87–5.11)
RDW: 12.6 % (ref 11.5–15.5)
WBC: 5.9 10*3/uL (ref 4.0–10.5)
nRBC: 0 % (ref 0.0–0.2)

## 2021-01-17 ENCOUNTER — Encounter (HOSPITAL_COMMUNITY): Payer: Self-pay | Admitting: Internal Medicine

## 2021-01-17 ENCOUNTER — Other Ambulatory Visit: Payer: Self-pay

## 2021-01-17 ENCOUNTER — Inpatient Hospital Stay (HOSPITAL_COMMUNITY): Payer: Medicare HMO | Admitting: Oncology

## 2021-01-17 VITALS — BP 126/88 | HR 69 | Temp 97.2°F | Resp 20

## 2021-01-17 DIAGNOSIS — D472 Monoclonal gammopathy: Secondary | ICD-10-CM | POA: Diagnosis not present

## 2021-01-17 DIAGNOSIS — E538 Deficiency of other specified B group vitamins: Secondary | ICD-10-CM | POA: Diagnosis not present

## 2021-01-17 MED ORDER — CYANOCOBALAMIN 1000 MCG/ML IJ SOLN
1000.0000 ug | Freq: Once | INTRAMUSCULAR | Status: AC
  Administered 2021-01-17: 1000 ug via INTRAMUSCULAR
  Filled 2021-01-17: qty 1

## 2021-01-17 MED ORDER — CYANOCOBALAMIN 1000 MCG/ML IJ SOLN: 1000.0000 ug | INTRAMUSCULAR | 5 refills | Status: DC

## 2021-01-17 NOTE — Progress Notes (Signed)
Sarah Cooley presents today for office visit and  injection per the provider's orders.  Vitamin B-12 administration without incident to Left Deltoid; injection site WNL; see MAR for injection details.  Patient remained in stable condition during administration. No questions or complaints noted at this time. Patient discharged ambulatory and in stable condition.
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
## 2021-01-23 ENCOUNTER — Other Ambulatory Visit (HOSPITAL_COMMUNITY): Payer: Medicare HMO

## 2021-01-24 ENCOUNTER — Inpatient Hospital Stay (HOSPITAL_COMMUNITY): Payer: Medicare HMO | Attending: Oncology

## 2021-01-24 ENCOUNTER — Other Ambulatory Visit: Payer: Self-pay

## 2021-01-24 DIAGNOSIS — Z85528 Personal history of other malignant neoplasm of kidney: Secondary | ICD-10-CM | POA: Diagnosis not present

## 2021-01-24 DIAGNOSIS — Z808 Family history of malignant neoplasm of other organs or systems: Secondary | ICD-10-CM | POA: Diagnosis not present

## 2021-01-24 DIAGNOSIS — Z800 Family history of malignant neoplasm of digestive organs: Secondary | ICD-10-CM | POA: Diagnosis not present

## 2021-01-24 DIAGNOSIS — Z86000 Personal history of in-situ neoplasm of breast: Secondary | ICD-10-CM | POA: Insufficient documentation

## 2021-01-24 DIAGNOSIS — Z803 Family history of malignant neoplasm of breast: Secondary | ICD-10-CM | POA: Insufficient documentation

## 2021-01-24 DIAGNOSIS — D472 Monoclonal gammopathy: Secondary | ICD-10-CM | POA: Diagnosis not present

## 2021-01-24 DIAGNOSIS — R296 Repeated falls: Secondary | ICD-10-CM | POA: Diagnosis not present

## 2021-01-24 DIAGNOSIS — R200 Anesthesia of skin: Secondary | ICD-10-CM | POA: Insufficient documentation

## 2021-01-26 ENCOUNTER — Other Ambulatory Visit (HOSPITAL_COMMUNITY): Payer: Self-pay

## 2021-01-26 DIAGNOSIS — D472 Monoclonal gammopathy: Secondary | ICD-10-CM

## 2021-01-26 LAB — PROTEIN ELECTRO, RANDOM URINE
Albumin ELP, Urine: 100 %
Alpha-1-Globulin, U: 0 %
Alpha-2-Globulin, U: 0 %
Beta Globulin, U: 0 %
Gamma Globulin, U: 0 %
Total Protein, Urine: 4.8 mg/dL

## 2021-01-26 LAB — IMMUNOFIXATION, URINE

## 2021-01-30 ENCOUNTER — Other Ambulatory Visit: Payer: Self-pay | Admitting: Radiology

## 2021-01-30 ENCOUNTER — Inpatient Hospital Stay (HOSPITAL_COMMUNITY): Admission: RE | Admit: 2021-01-30 | Payer: Medicare HMO | Source: Ambulatory Visit

## 2021-01-30 LAB — IMMUNOFIXATION, URINE

## 2021-01-31 ENCOUNTER — Ambulatory Visit (HOSPITAL_COMMUNITY)
Admission: RE | Admit: 2021-01-31 | Discharge: 2021-01-31 | Disposition: A | Discharge status: Home / Self Care | Payer: Medicare HMO | Source: Ambulatory Visit | Attending: Oncology | Admitting: Oncology

## 2021-01-31 ENCOUNTER — Other Ambulatory Visit (HOSPITAL_COMMUNITY): Payer: Self-pay | Admitting: Oncology

## 2021-01-31 ENCOUNTER — Other Ambulatory Visit: Payer: Self-pay

## 2021-01-31 ENCOUNTER — Encounter (HOSPITAL_COMMUNITY): Payer: Self-pay

## 2021-01-31 DIAGNOSIS — Z853 Personal history of malignant neoplasm of breast: Secondary | ICD-10-CM | POA: Insufficient documentation

## 2021-01-31 DIAGNOSIS — Z881 Allergy status to other antibiotic agents status: Secondary | ICD-10-CM | POA: Diagnosis not present

## 2021-01-31 DIAGNOSIS — Z888 Allergy status to other drugs, medicaments and biological substances status: Secondary | ICD-10-CM | POA: Diagnosis not present

## 2021-01-31 DIAGNOSIS — Z85528 Personal history of other malignant neoplasm of kidney: Secondary | ICD-10-CM | POA: Diagnosis not present

## 2021-01-31 DIAGNOSIS — Z923 Personal history of irradiation: Secondary | ICD-10-CM | POA: Insufficient documentation

## 2021-01-31 DIAGNOSIS — R768 Other specified abnormal immunological findings in serum: Secondary | ICD-10-CM | POA: Diagnosis present

## 2021-01-31 DIAGNOSIS — Z79899 Other long term (current) drug therapy: Secondary | ICD-10-CM | POA: Diagnosis not present

## 2021-01-31 DIAGNOSIS — D472 Monoclonal gammopathy: Secondary | ICD-10-CM

## 2021-01-31 LAB — CBC WITH DIFFERENTIAL/PLATELET
Abs Immature Granulocytes: 0.01 10*3/uL (ref 0.00–0.07)
Basophils Absolute: 0 10*3/uL (ref 0.0–0.1)
Basophils Relative: 0 %
Eosinophils Absolute: 0.3 10*3/uL (ref 0.0–0.5)
Eosinophils Relative: 4 %
HCT: 43 % (ref 36.0–46.0)
Hemoglobin: 14.5 g/dL (ref 12.0–15.0)
Immature Granulocytes: 0 %
Lymphocytes Relative: 31 %
Lymphs Abs: 1.9 10*3/uL (ref 0.7–4.0)
MCH: 31.5 pg (ref 26.0–34.0)
MCHC: 33.7 g/dL (ref 30.0–36.0)
MCV: 93.5 fL (ref 80.0–100.0)
Monocytes Absolute: 0.7 10*3/uL (ref 0.1–1.0)
Monocytes Relative: 11 %
Neutro Abs: 3.3 10*3/uL (ref 1.7–7.7)
Neutrophils Relative %: 54 %
Platelets: 203 10*3/uL (ref 150–400)
RBC: 4.6 MIL/uL (ref 3.87–5.11)
RDW: 12.2 % (ref 11.5–15.5)
WBC: 6.1 10*3/uL (ref 4.0–10.5)
nRBC: 0 % (ref 0.0–0.2)

## 2021-01-31 LAB — PROTIME-INR
INR: 0.9 (ref 0.8–1.2)
Prothrombin Time: 11.8 seconds (ref 11.4–15.2)

## 2021-01-31 MED ORDER — LIDOCAINE HCL (PF) 1 % IJ SOLN
INTRAMUSCULAR | Status: AC | PRN
  Administered 2021-01-31: 10 mL via INTRADERMAL

## 2021-01-31 MED ORDER — NALOXONE HCL 0.4 MG/ML IJ SOLN
INTRAMUSCULAR | Status: AC
  Filled 2021-01-31: qty 1

## 2021-01-31 MED ORDER — FLUMAZENIL 0.5 MG/5ML IV SOLN
INTRAVENOUS | Status: AC
  Filled 2021-01-31: qty 5

## 2021-01-31 MED ORDER — SODIUM CHLORIDE 0.9 % IV SOLN: INTRAVENOUS | Status: DC

## 2021-01-31 MED ORDER — MIDAZOLAM HCL 2 MG/2ML IJ SOLN
INTRAMUSCULAR | Status: AC | PRN
  Administered 2021-01-31: 0.5 mg via INTRAVENOUS
  Administered 2021-01-31: 1.5 mg via INTRAVENOUS

## 2021-01-31 MED ORDER — FENTANYL CITRATE (PF) 100 MCG/2ML IJ SOLN
INTRAMUSCULAR | Status: AC | PRN
  Administered 2021-01-31 (×2): 50 ug via INTRAVENOUS

## 2021-01-31 MED ORDER — MIDAZOLAM HCL 2 MG/2ML IJ SOLN
INTRAMUSCULAR | Status: AC
  Filled 2021-01-31: qty 4

## 2021-01-31 MED ORDER — FENTANYL CITRATE (PF) 100 MCG/2ML IJ SOLN
INTRAMUSCULAR | Status: AC
  Filled 2021-01-31: qty 2

## 2021-01-31 NOTE — Discharge Instructions (Signed)
Please call Interventional Radiology clinic [phone] with any questions or concerns. ° °You may remove your dressing and shower tomorrow. ° ° °Bone Marrow Aspiration and Bone Marrow Biopsy, Adult, Care After °This sheet gives you information about how to care for yourself after your procedure. Your health care provider may also give you more specific instructions. If you have problems or questions, contact your health care provider. °What can I expect after the procedure? °After the procedure, it is common to have: °Mild pain and tenderness. °Swelling. °Bruising. °Follow these instructions at home: °Puncture site care °Follow instructions from your health care provider about how to take care of the puncture site. Make sure you: °Wash your hands with soap and water before and after you change your bandage (dressing). If soap and water are not available, use hand sanitizer. °Change your dressing as told by your health care provider. °Check your puncture site every day for signs of infection. Check for: °More redness, swelling, or pain. °Fluid or blood. °Warmth. °Pus or a bad smell.   °Activity °Return to your normal activities as told by your health care provider. Ask your health care provider what activities are safe for you. °Do not lift anything that is heavier than 10 lb (4.5 kg), or the limit that you are told, until your health care provider says that it is safe. °Do not drive for 24 hours if you were given a sedative during your procedure. °General instructions °Take over-the-counter and prescription medicines only as told by your health care provider. °Do not take baths, swim, or use a hot tub until your health care provider approves. Ask your health care provider if you may take showers. You may only be allowed to take sponge baths. °If directed, put ice on the affected area. To do this: °Put ice in a plastic bag. °Place a towel between your skin and the bag. °Leave the ice on for 20 minutes, 2-3 times a  day. °Keep all follow-up visits as told by your health care provider. This is important.   °Contact a health care provider if: °Your pain is not controlled with medicine. °You have a fever. °You have more redness, swelling, or pain around the puncture site. °You have fluid or blood coming from the puncture site. °Your puncture site feels warm to the touch. °You have pus or a bad smell coming from the puncture site. °Summary °After the procedure, it is common to have mild pain, tenderness, swelling, and bruising. °Follow instructions from your health care provider about how to take care of the puncture site and what activities are safe for you. °Take over-the-counter and prescription medicines only as told by your health care provider. °Contact a health care provider if you have any signs of infection, such as fluid or blood coming from the puncture site. °This information is not intended to replace advice given to you by your health care provider. Make sure you discuss any questions you have with your health care provider. °Document Revised: 02/24/2019 Document Reviewed: 02/24/2019 °Elsevier Patient Education © 2021 [employer] ° ° °Moderate Conscious Sedation, Adult, Care After °This sheet gives you information about how to care for yourself after your procedure. Your health care provider may also give you more specific instructions. If you have problems or questions, contact your health care provider. °What can I expect after the procedure? °After the procedure, it is common to have: °Sleepiness for several hours. °Impaired judgment for several hours. °Difficulty with balance. °Vomiting if you eat too   soon. °Follow these instructions at home: °For the time period you were told by your health care provider: °Rest. °Do not participate in activities where you could fall or become injured. °Do not drive or use machinery. °Do not drink alcohol. °Do not take sleeping pills or medicines that cause drowsiness. °Do not  make important decisions or sign legal documents. °Do not take care of children on your own.  °  °  °Eating and drinking °Follow the diet recommended by your health care provider. °Drink enough fluid to keep your urine pale yellow. °If you vomit: °Drink water, juice, or soup when you can drink without vomiting. °Make sure you have little or no nausea before eating solid foods.   °General instructions °Take over-the-counter and prescription medicines only as told by your health care provider. °Have a responsible adult stay with you for the time you are told. It is important to have someone help care for you until you are awake and alert. °Do not smoke. °Keep all follow-up visits as told by your health care provider. This is important. °Contact a health care provider if: °You are still sleepy or having trouble with balance after 24 hours. °You feel light-headed. °You keep feeling nauseous or you keep vomiting. °You develop a rash. °You have a fever. °You have redness or swelling around the IV site. °Get help right away if: °You have trouble breathing. °You have new-onset confusion at home. °Summary °After the procedure, it is common to feel sleepy, have impaired judgment, or feel nauseous if you eat too soon. °Rest after you get home. Know the things you should not do after the procedure. °Follow the diet recommended by your health care provider and drink enough fluid to keep your urine pale yellow. °Get help right away if you have trouble breathing or new-onset confusion at home. °This information is not intended to replace advice given to you by your health care provider. Make sure you discuss any questions you have with your health care provider. °Document Revised: 02/05/2020 Document Reviewed: 09/03/2019 °Elsevier Patient Education © 2021 Elsevier Inc.  °

## 2021-01-31 NOTE — Procedures (Signed)
Interventional Radiology Procedure Note  Procedure: CT guided aspirate and core biopsy of right iliac bone Complications: None EBL: None Recommendations: - Bedrest supine x 1 hrs - Hydrocodone PRN  Pain - Follow biopsy results  Signed,  Criselda Peaches, MD

## 2021-01-31 NOTE — H&P (Signed)
Referring Physician(s): Burns,Jennifer E/Katragadda,S  Supervising Physician: Jacqulynn Cadet  Patient Status:  [facility]  Chief Complaint:  "I'm here for a bone marrow biopsy"  Subjective: Patient familiar to IR service from bone marrow biopsy in 2017.  She has a history of right breast carcinoma in 2016, status post lumpectomy with radiation therapy as well as left renal carcinoma in 2019 with partial nephrectomy.  She has a history of MGUS with elevated kappa lambda light chain ratio and kappa free light chains.  She presents again today for CT-guided bone marrow biopsy for further evaluation.  Additional medical history as listed below. She denies fever,HA,CP,dyspnea, cough, abd pain, back pain,N/V or bleeding.   Past Medical History:  Diagnosis Date  . Allergy    seasonal  . Anxiety   . Arthritis   . Balance problem   . Brain aneurysm    2 mm ACA region aneurysm by 09/21/15 MRA  . Breast cancer ([place])   . Breast cancer of lower-inner quadrant of right female breast ([place]) 08/26/2015  . Chronic kidney disease    acute renal failure one occasion  . Chronic low back pain 12/02/2018  . Complication of anesthesia    heart rate drops and O2 sats drop  . Constipation   . Depression   . Diplopia 10/29/2017  . Dizziness 10/29/2017  . Elevated liver function tests   . Family history of adverse reaction to anesthesia    mom has n/v  . Family history of breast cancer   . Family history of colon cancer   . Fibromyalgia   . Fibromyalgia   . Gait abnormality 10/29/2017  . Genetic testing 09/08/2015   Negative genetic testing on the Breast/High Moderate Risk panel. The Breast High/Moderate Risk gene panel offered by [facility] includes sequencing and deletion/duplication analysis of the following 9 genes: ATM, BRCA1, BRCA2, CDH1, CHEK2, PALB2, PTEN, STK11, and TP53. The report date is September 08, 2015.  The Rest of the Comprehensive Cancer panel has been reflexed and will be available in 2-3  weeks.  POLD1 c.208G>T VUS found on the Remainder of the Comprehensive cancer panel.  The Comprehensive Cancer Panel offered by [facility] includes sequencing and/or deletion duplication testing of the following 32 genes: APC, ATM, AXIN2, BARD1, BMPR1A, BRCA1, BRCA2, BRIP1, CDH1, CDK4, CDKN2A, CHEK2, EPCAM, FANCC, MLH1, MSH2, MSH6, MUTYH, NBN, PALB2, PMS2, POLD1, POLE, PTEN, RAD51C, RAD51D, SCG5/GREM1, SMAD4, STK11, TP53, VHL, and XRCC2.   The report date is 09/12/2015.   Marland Kitchen GERD (gastroesophageal reflux disease)    none recent  . H/O acute renal failure 09/22/15   admitted to [facility]  . Headache    occipital and temporal  . History of hiatal hernia   . Hyperlipemia   . Hyperlipidemia   . Hypertension   . Irritable bowel syndrome (IBS)   . Lyme disease   . MGUS (monoclonal gammopathy of unknown significance) 03/15/2016  . Neuromuscular disorder (H[facility])    fibromyalgia  . Nocturnal myoclonus   . Occasional tremors   . OSA (obstructive sleep apnea)    uses cpap with humidification- auto   . Personal history of radiation therapy   . PONV (postoperative nausea and vomiting)   . Renal cell carcinoma ([place])   . TIA (transient ischemic attack) 2021  . Tremor, essential 10/29/2017   Past Surgical History:  Procedure Laterality Date  . ABDOMINAL HYSTERECTOMY     adenomyosis  . ABDOMINAL HYSTERECTOMY    . BIOPSY  01/10/2021   Procedure:  BIOPSY;  Surgeon: Eloise Harman, DO;  [facility];  Service: Endoscopy;;  . BLADDER SURGERY    . BONE GRAFT HIP ILIAC CREST    . BREAST BIOPSY Bilateral   . BREAST LUMPECTOMY    . BREAST SURGERY    . CHOLECYSTECTOMY    . COLONOSCOPY WITH PROPOFOL  05/14/2016   [facility]; entire examined colon normal.  Recommendations to repeat colonoscopy in 10 years for screening purposes.  . COLONOSCOPY WITH PROPOFOL N/A 01/10/2021   Procedure: COLONOSCOPY WITH PROPOFOL;  Surgeon: Eloise Harman, DO;  [facility];  Service: Endoscopy;   Laterality: N/A;  pm appt  . ESOPHAGOGASTRODUODENOSCOPY (EGD) WITH PROPOFOL  03/24/2018   [facility]; normal esophagus, erythematous mucosa in the gastric body and antrum s/p biopsy, normal examined duodenum s/p biopsy.  Pathology with benign duodenal biopsy.  Stomach biopsies with chronic inactive gastritis, negative for H. pylori.  . ESOPHAGOGASTRODUODENOSCOPY (EGD) WITH PROPOFOL N/A 01/10/2021   Procedure: ESOPHAGOGASTRODUODENOSCOPY (EGD) WITH PROPOFOL;  Surgeon: Eloise Harman, DO;  [facility];  Service: Endoscopy;  Laterality: N/A;  . FRACTURE SURGERY Right    wrist  . KNEE ARTHROSCOPY    . KNEE ARTHROSCOPY W/ ACL RECONSTRUCTION     right  . POLYPECTOMY  01/10/2021   Procedure: POLYPECTOMY;  Surgeon: Eloise Harman, DO;  [facility];  Service: Endoscopy;;  . RADIOACTIVE SEED GUIDED PARTIAL MASTECTOMY WITH AXILLARY SENTINEL LYMPH NODE BIOPSY Right 10/13/2015   Procedure: RADIOACTIVE SEED GUIDED PARTIAL MASTECTOMY WITH AXILLARY SENTINEL LYMPH NODE BIOPSY;  Surgeon: Erroll Luna, MD;  Location: [facility];  Service: General;  Laterality: Right;  . right carpal and cubital tunnel release     . ROBOTIC ASSITED PARTIAL NEPHRECTOMY Left 06/19/2018   Procedure: XI ROBOTIC ASSITED PARTIAL NEPHRECTOMY;  Surgeon: Ardis Hughs, MD;  Location: [facility];  Service: Urology;  Laterality: Left;  . WRIST SURGERY        Allergies: Ramipril, Brompheniramine-pseudoeph, Minocycline hcl, Other, Altace [ramipril], Lyrica [pregabalin], Adhesive [tape], Bactrim [sulfamethoxazole-trimethoprim], Drixoral [brompheniramine-pseudoeph], Erythromycin, Minocin [minocycline hcl], Septra [sulfamethoxazole-trimethoprim], and Sinutab [chlorphen-pseudoephed-apap]  Medications: Prior to Admission medications   Medication Sig Start Date End Date Taking? Authorizing Provider  ALPRAZolam Duanne Moron) 1 MG tablet Take 1 mg by mouth at bedtime.   Yes [provider]  cyanocobalamin (,VITAMIN  B-12,) 1000 MCG/ML injection Inject 1 mL (1,000 mcg total) into the muscle every 30 (thirty) days. 01/17/21  Yes Burns, Wandra Feinstein, NP  diltiazem (CARDIZEM CD) 180 MG 24 hr capsule TAKE ONE CAPSULE BY MOUTH DAILY Patient taking differently: Take 180 mg by mouth daily. 10/26/20  Yes BranchAlphonse Guild, MD  FLUoxetine (PROZAC) 20 MG capsule Take 60 mg by mouth daily. 06/19/18  Yes [provider]  losartan (COZAAR) 25 MG tablet Take 25 mg by mouth daily. 06/10/18  Yes [provider]  ondansetron (ZOFRAN) 4 MG tablet Take 1 tablet (4 mg total) by mouth every 8 (eight) hours as needed for nausea or vomiting. 12/12/20  Yes Erenest Rasher, PA-C  pantoprazole (PROTONIX) 40 MG tablet Take 1 tablet (40 mg total) by mouth 2 (two) times daily. 12/12/20  Yes Harper, Kristen S, PA-C  pramipexole (MIRAPEX) 0.125 MG tablet TAKE ONE TAB BY MOUTH DAILY AT DINNERTIME AND TAKE TWO TABS BY MOUTH AT BEDTIME. PLEASE CALL [phone] TO SCHEDULE APPT. 01/09/21  Yes Suzzanne Cloud, NP  pravastatin (PRAVACHOL) 10 MG tablet Take 10 mg by mouth daily.  Yes [provider]  propranolol (INDERAL) 60 MG tablet Take 60 mg by mouth daily.   Yes [provider]  topiramate (TOPAMAX) 50 MG tablet Take 2 in the morning, take 3 at night Patient taking differently: Take 100-150 mg by mouth See admin instructions. Take 100 mg in the morning, take 150 mg at night 06/01/20  Yes Suzzanne Cloud, NP  Vitamin D, Ergocalciferol, (DRISDOL) 50000 units CAPS capsule Take 50,000 Units by mouth every 7 (seven) days. Take 1 tablet (50,000 units) by mouth every Thursday   Yes [provider]  Wheat Dextrin (BENEFIBER PO) Take 1 Scoop by mouth daily.   Yes [provider]  furosemide (LASIX) 20 MG tablet Take 20 mg by mouth daily as needed for edema.    [provider]  HYDROcodone-acetaminophen (NORCO) 7.5-325 MG tablet Take 0.5 tablets by mouth 3 (three) times daily as needed for severe pain  (Takes 0.5 tablet  at bedtime). Patient not taking: Reported on 01/17/2021 11/21/18   [provider]  hyoscyamine (LEVSIN SL) 0.125 MG SL tablet Place under the tongue every 4 (four) hours. 11/28/20   [provider]  linaclotide (LINZESS) 72 MCG capsule Take 72 mcg by mouth daily before breakfast.    [provider]  OVER THE COUNTER MEDICATION Take 1 capsule by mouth daily as needed (nausea). Diphenhydramate Over The Counter Anti-emetic    [provider]  potassium chloride SA (KLOR-CON) 20 MEQ tablet Take by mouth.    [provider]     Vital Signs: BP (!) 162/104   Pulse 73   Temp 98.7 F (37.1 C) (Oral)   Resp 16   Ht _0  (1.727 m)   Wt 223 lb 15.8 oz (101.6 kg)   SpO2 100%   BMI 34.06 kg/m   Physical Exam awake/alert; chest- distant but clear BS bilat; heart- RRR; abd- soft,+BS,NT; no LE edema  Imaging: No results found.  Labs:  CBC: Recent Labs    05/25/20 0947 12/20/20 0950 01/16/21 1435 01/31/21 0730  WBC 5.1 5.3 5.9 6.1  HGB 13.3 13.2 12.9 14.5  HCT 40.6 40.6 39.3 43.0  PLT 204 190 188 203    COAGS: Recent Labs    01/31/21 0730  INR 0.9    BMP: Recent Labs    05/25/20 0947 12/20/20 0950 01/06/21 1637 01/16/21 1435  NA 139 141 141 135  K 3.6 3.3* 3.3* 3.6  CL 107 110 110 107  CO2 21* 21* 21* 18*  GLUCOSE 115* 98 120* 103*  BUN 19 21* 21* 20  CALCIUM 9.4 8.8* 8.9 9.0  CREATININE 0.89 0.98 0.88 0.85  GFRNONAA >60 >60 >60 >60  GFRAA >60  --   --   --     LIVER FUNCTION TESTS: Recent Labs    05/25/20 0947 12/12/20 1444 12/20/20 0950 01/16/21 1435  BILITOT 0.4 0.4 0.3 0.3  AST _1 ALT 48* 30* 27 25  ALKPHOS 62  --  62 59  PROT 7.5 7.6 7.2 7.2  ALBUMIN 4.1  --  3.9 3.9    Assessment and Plan: Patient familiar to IR service from bone marrow biopsy in 2017.  She has a history of right breast carcinoma in 2016, status post lumpectomy with radiation therapy as well as left renal  carcinoma in 2019 with partial nephrectomy.  She has a history of MGUS with elevated kappa lambda light chain ratio and kappa free light chains.  She presents again  today for CT-guided bone marrow biopsy for further evaluation. Risks and benefits of procedure was discussed with the patient including, but not limited to bleeding, infection, damage to adjacent structures or low yield requiring additional tests.  All of the questions were answered and there is agreement to proceed.  Consent signed and in chart.     Electronically Signed: D. Rowe Robert, PA-C 01/31/2021, 8:29 AM   I spent a total of 20 minutes at the the patient's bedside AND on the patient's hospital floor or unit, greater than 50% of which was counseling/coordinating care for CT guided bone marrow biopsy

## 2021-02-02 ENCOUNTER — Ambulatory Visit (HOSPITAL_COMMUNITY)
Admission: RE | Admit: 2021-02-02 | Discharge: 2021-02-02 | Disposition: A | Discharge status: Home / Self Care | Payer: Medicare HMO | Source: Ambulatory Visit | Attending: Oncology | Admitting: Oncology

## 2021-02-02 ENCOUNTER — Other Ambulatory Visit: Payer: Self-pay

## 2021-02-02 ENCOUNTER — Ambulatory Visit (HOSPITAL_COMMUNITY): Admission: RE | Admit: 2021-02-02 | Payer: Medicare HMO | Source: Ambulatory Visit

## 2021-02-02 DIAGNOSIS — D472 Monoclonal gammopathy: Secondary | ICD-10-CM | POA: Insufficient documentation

## 2021-02-02 LAB — SURGICAL PATHOLOGY

## 2021-02-02 LAB — GLUCOSE, CAPILLARY: Glucose-Capillary: 106 mg/dL — ABNORMAL HIGH (ref 70–99)

## 2021-02-02 MED ORDER — FLUDEOXYGLUCOSE F - 18 (FDG) INJECTION
11.6000 | Freq: Once | INTRAVENOUS | Status: AC | PRN
  Administered 2021-02-02: 11.15 via INTRAVENOUS

## 2021-02-06 ENCOUNTER — Ambulatory Visit (HOSPITAL_COMMUNITY): Payer: Medicare HMO | Admitting: Hematology

## 2021-02-09 ENCOUNTER — Other Ambulatory Visit: Payer: Self-pay

## 2021-02-09 ENCOUNTER — Inpatient Hospital Stay (HOSPITAL_BASED_OUTPATIENT_CLINIC_OR_DEPARTMENT_OTHER): Payer: Medicare HMO | Admitting: Hematology

## 2021-02-09 VITALS — BP 126/81 | HR 73 | Temp 97.8°F | Resp 18 | Wt 229.0 lb

## 2021-02-09 DIAGNOSIS — D472 Monoclonal gammopathy: Secondary | ICD-10-CM | POA: Diagnosis not present

## 2021-02-09 NOTE — Patient Instructions (Signed)
[place] at [facility] Discharge Instructions  You were seen today by Dr. Delton Coombes. He went over your recent results. Keep your appointment to have your mammogram done on June 20. Your next appointment will be with the physician assistant in 6 months for labs and follow up.   Thank you for choosing [place] at [facility] to provide your oncology and hematology care.  To afford each patient quality time with our provider, please arrive at least 15 minutes before your scheduled appointment time.   If you have a lab appointment with the [facility] please come in thru the Main Entrance and check in at the main information desk  You need to re-schedule your appointment should you arrive 10 or more minutes late.  We strive to give you quality time with our providers, and arriving late affects you and other patients whose appointments are after yours.  Also, if you no show three or more times for appointments you may be dismissed from the clinic at the providers discretion.     Again, thank you for choosing [facility].  Our hope is that these requests will decrease the amount of time that you wait before being seen by our physicians.       _____________________________________________________________  Should you have questions after your visit to [facility], please contact our office at [phone] between the hours of 8:00 a.m. and 4:30 p.m.  Voicemails left after 4:00 p.m. will not be returned until the following business day.  For prescription refill requests, have your pharmacy contact our office and allow 72 hours.    Cancer Center Support Programs:   > Cancer Support Group  2nd Tuesday of the month 1pm-2pm, Journey Room

## 2021-02-09 NOTE — Progress Notes (Signed)
[place], [place] [address]   CLINIC:  Medical Oncology/Hematology  PCP:  Pablo Lawrence, NP [address] [place] [address]  [phone]  REASON FOR VISIT:  Follow-up for MGUS  PRIOR THERAPY: Right lumpectomy and SLNB on 10/03/2015 with adjuvant radiation  CURRENT THERAPY: Surveillance  INTERVAL HISTORY:  [name], a 55 y.o. female, returns for routine follow-up for her MGUS. Arlyn was last seen by Faythe Casa, NP, on 01/17/2021.  Today she reports feeling fair. She reports having constant numbness in her hands and feet and reports having occasional swelling in her left wrist. She still has balance issues due to the numbness in her feet and falls often, so she will start PT.  She is scheduled to have her mammogram on 06/20.   REVIEW OF SYSTEMS:  Review of Systems  Constitutional: Positive for appetite change (50%) and fatigue (depleted).  Respiratory: Positive for shortness of breath (w/ exertion).   Gastrointestinal: Positive for nausea.  Musculoskeletal: Positive for gait problem (d/t peripheral neuropathy) and myalgias (5/10 generalized pain).       Occasional swelling in L wrist  Neurological: Positive for gait problem (d/t peripheral neuropathy) and numbness (hands & feet).  All other systems reviewed and are negative.   PAST MEDICAL/SURGICAL HISTORY:  Past Medical History:  Diagnosis Date  . Allergy    seasonal  . Anxiety   . Arthritis   . Balance problem   . Brain aneurysm    2 mm ACA region aneurysm by 09/21/15 MRA  . Breast cancer ([place])   . Breast cancer of lower-inner quadrant of right female breast ([facility]) 08/26/2015  . Chronic kidney disease    acute renal failure one occasion  . Chronic low back pain 12/02/2018  . Complication of anesthesia    heart rate drops and O2 sats drop  . Constipation   . Depression   . Diplopia 10/29/2017  . Dizziness 10/29/2017  . Elevated liver  function tests   . Family history of adverse reaction to anesthesia    mom has n/v  . Family history of breast cancer   . Family history of colon cancer   . Fibromyalgia   . Fibromyalgia   . Gait abnormality 10/29/2017  . Genetic testing 09/08/2015   Negative genetic testing on the Breast/High Moderate Risk panel. The Breast High/Moderate Risk gene panel offered by [facility] includes sequencing and deletion/duplication analysis of the following 9 genes: ATM, BRCA1, BRCA2, CDH1, CHEK2, PALB2, PTEN, STK11, and TP53. The report date is September 08, 2015.  The Rest of the Comprehensive Cancer panel has been reflexed and will be available in 2-3 weeks.  POLD1 c.208G>T VUS found on the Remainder of the Comprehensive cancer panel.  The Comprehensive Cancer Panel offered by [facility] includes sequencing and/or deletion duplication testing of the following 32 genes: APC, ATM, AXIN2, BARD1, BMPR1A, BRCA1, BRCA2, BRIP1, CDH1, CDK4, CDKN2A, CHEK2, EPCAM, FANCC, MLH1, MSH2, MSH6, MUTYH, NBN, PALB2, PMS2, POLD1, POLE, PTEN, RAD51C, RAD51D, SCG5/GREM1, SMAD4, STK11, TP53, VHL, and XRCC2.   The report date is 09/12/2015.   Marland Kitchen GERD (gastroesophageal reflux disease)    none recent  . H/O acute renal failure 09/22/15   admitted to [facility]  . Headache    occipital and temporal  . History of hiatal hernia   . Hyperlipemia   . Hyperlipidemia   . Hypertension   . Irritable bowel syndrome (IBS)   .  Lyme disease   . MGUS (monoclonal gammopathy of unknown significance) 03/15/2016  . Neuromuscular disorder ([facility])    fibromyalgia  . Nocturnal myoclonus   . Occasional tremors   . OSA (obstructive sleep apnea)    uses cpap with humidification- auto   . Personal history of radiation therapy   . PONV (postoperative nausea and vomiting)   . Renal cell carcinoma ([facility])   . TIA (transient ischemic attack) 2021  . Tremor, essential 10/29/2017   Past Surgical History:  Procedure Laterality Date  . ABDOMINAL HYSTERECTOMY      adenomyosis  . ABDOMINAL HYSTERECTOMY    . BIOPSY  01/10/2021   Procedure: BIOPSY;  Surgeon: Eloise Harman, DO;  [facility]: [facility];  Service: Endoscopy;;  . BLADDER SURGERY    . BONE GRAFT HIP ILIAC CREST    . BREAST BIOPSY Bilateral   . BREAST LUMPECTOMY    . BREAST SURGERY    . CHOLECYSTECTOMY    . COLONOSCOPY WITH PROPOFOL  05/14/2016   [facility]; entire examined colon normal.  Recommendations to repeat colonoscopy in 10 years for screening purposes.  . COLONOSCOPY WITH PROPOFOL N/A 01/10/2021   Procedure: COLONOSCOPY WITH PROPOFOL;  Surgeon: Eloise Harman, DO;  [facility]: [facility];  Service: Endoscopy;  Laterality: N/A;  pm appt  . ESOPHAGOGASTRODUODENOSCOPY (EGD) WITH PROPOFOL  03/24/2018   [facility]; normal esophagus, erythematous mucosa in the gastric body and antrum s/p biopsy, normal examined duodenum s/p biopsy.  Pathology with benign duodenal biopsy.  Stomach biopsies with chronic inactive gastritis, negative for H. pylori.  . ESOPHAGOGASTRODUODENOSCOPY (EGD) WITH PROPOFOL N/A 01/10/2021   Procedure: ESOPHAGOGASTRODUODENOSCOPY (EGD) WITH PROPOFOL;  Surgeon: Eloise Harman, DO;  [facility]: [facility];  Service: Endoscopy;  Laterality: N/A;  . FRACTURE SURGERY Right    wrist  . KNEE ARTHROSCOPY    . KNEE ARTHROSCOPY W/ ACL RECONSTRUCTION     right  . POLYPECTOMY  01/10/2021   Procedure: POLYPECTOMY;  Surgeon: Eloise Harman, DO;  [facility]: [facility];  Service: Endoscopy;;  . RADIOACTIVE SEED GUIDED PARTIAL MASTECTOMY WITH AXILLARY SENTINEL LYMPH NODE BIOPSY Right 10/13/2015   Procedure: RADIOACTIVE SEED GUIDED PARTIAL MASTECTOMY WITH AXILLARY SENTINEL LYMPH NODE BIOPSY;  Surgeon: Erroll Luna, MD;  Location: [facility];  Service: General;  Laterality: Right;  . right carpal and cubital tunnel release     . ROBOTIC ASSITED PARTIAL NEPHRECTOMY Left 06/19/2018   Procedure: XI ROBOTIC ASSITED PARTIAL NEPHRECTOMY;  Surgeon: Ardis Hughs, MD;  Location:  [facility];  Service: Urology;  Laterality: Left;  . WRIST SURGERY      SOCIAL HISTORY:  Social History   Socioeconomic History  . Marital status: Married    Spouse name: Psychologist, clinical  . Number of children: 2  . Years of education: 8  . Highest education level: Not on file  Occupational History  . Occupation: physician office  Tobacco Use  . Smoking status: Never Smoker  . Smokeless tobacco: Never Used  Vaping Use  . Vaping Use: Never used  Substance and Sexual Activity  . Alcohol use: Not Currently    Comment: maybe one a year  . Drug use: Never  . Sexual activity: Yes    Birth control/protection: Surgical  Other Topics Concern  . Not on file  Social History Narrative   ** Merged History Encounter **       Lives with husband Husband on disability Children grown and moved worries about family illness- mother and  sister Caffeine use:     Social Determinants of Radio broadcast assistant Strain: Not on file  Food Insecurity: Not on file  Transportation Needs: Not on file  Physical Activity: Not on file  Stress: Not on file  Social Connections: Not on file  Intimate Partner Violence: Not on file    FAMILY HISTORY:  Family History  Problem Relation Age of Onset  . Hypertension Mother   . Autoimmune disease Mother        lichen planus  . Eczema Mother   . Parkinson's disease Mother   . Mental illness Mother        bipolar  . Heart disease Maternal Aunt   . Lung cancer Paternal Aunt   . Breast cancer Paternal Aunt        dx in his 41s  . Parkinsonism Paternal Aunt   . Lung cancer Paternal Grandfather   . Colon cancer Paternal Uncle        dx in his 52s  . Colon cancer Paternal Uncle        dx in his 86s  . Melanoma Father 52  . Psoriasis Father   . Hypertension Father   . Heart disease Father   . Breast cancer Sister        dx in her 59s  . Lupus Sister   . COPD Maternal Uncle   . Skin cancer Maternal Uncle   . Colon cancer Paternal Uncle        dx in  his 53s  . Mental illness Son        bipolar  . Polycystic ovary syndrome Other     CURRENT MEDICATIONS:  Current Outpatient Medications  Medication Sig Dispense Refill  . ALPRAZolam (XANAX) 1 MG tablet Take 1 mg by mouth at bedtime.    . cyanocobalamin (,VITAMIN B-12,) 1000 MCG/ML injection Inject 1 mL (1,000 mcg total) into the muscle every 30 (thirty) days. 10 each 5  . diclofenac Sodium (VOLTAREN) 1 % GEL Apply 1 gram topically 4 times a day to affected area as needed    . diltiazem (CARDIZEM CD) 180 MG 24 hr capsule TAKE ONE CAPSULE BY MOUTH DAILY (Patient taking differently: Take 180 mg by mouth daily.) 90 capsule 3  . FLUoxetine (PROZAC) 20 MG capsule Take 60 mg by mouth daily.  12  . furosemide (LASIX) 20 MG tablet Take 20 mg by mouth daily as needed for edema.    Marland Kitchen HYDROcodone-acetaminophen (NORCO) 7.5-325 MG tablet Take 0.5 tablets by mouth 3 (three) times daily as needed for severe pain (Takes 0.5 tablet  at bedtime).    . hyoscyamine (LEVSIN SL) 0.125 MG SL tablet Place under the tongue every 4 (four) hours.    Marland Kitchen losartan (COZAAR) 25 MG tablet Take 25 mg by mouth daily.  0  . ondansetron (ZOFRAN) 4 MG tablet Take 1 tablet (4 mg total) by mouth every 8 (eight) hours as needed for nausea or vomiting. 30 tablet 0  . OVER THE COUNTER MEDICATION Take 1 capsule by mouth daily as needed (nausea). Diphenhydramate Over The Counter Anti-emetic    . pantoprazole (PROTONIX) 40 MG tablet Take 1 tablet (40 mg total) by mouth 2 (two) times daily. 60 tablet 3  . potassium chloride SA (KLOR-CON) 20 MEQ tablet Take by mouth.    . pramipexole (MIRAPEX) 0.125 MG tablet TAKE ONE TAB BY MOUTH DAILY AT DINNERTIME AND TAKE TWO TABS BY MOUTH AT BEDTIME. PLEASE CALL [phone] TO SCHEDULE APPT.  90 tablet 1  . pravastatin (PRAVACHOL) 10 MG tablet Take 10 mg by mouth daily.    . propranolol (INDERAL) 60 MG tablet Take 60 mg by mouth daily.    Marland Kitchen topiramate (TOPAMAX) 50 MG tablet Take 2 in the morning, take 3  at night (Patient taking differently: Take 100-150 mg by mouth See admin instructions. Take 100 mg in the morning, take 150 mg at night) 440 tablet 1  . Vitamin D, Ergocalciferol, (DRISDOL) 50000 units CAPS capsule Take 50,000 Units by mouth every 7 (seven) days. Take 1 tablet (50,000 units) by mouth every Thursday    . Wheat Dextrin (BENEFIBER PO) Take 1 Scoop by mouth daily.    Marland Kitchen linaclotide (LINZESS) 72 MCG capsule Take 72 mcg by mouth daily before breakfast. (Patient not taking: Reported on 02/09/2021)     No current facility-administered medications for this visit.    ALLERGIES:  Allergies  Allergen Reactions  . Ramipril Nausea And Vomiting and Other (See Comments)    Ramipril--Caused Pancreatitis  . Brompheniramine-Pseudoeph Rash  . Minocycline Hcl Hives  . Other Rash    ADHESIVE GLUE USED FOR INCISIONS  Anti-inflammatory medicine - pt stated, "Upsets my GI tract" Please use paper tape Please use paper tape Please use paper tape Patient is allergic to many adhesives   . Altace [Ramipril] Nausea And Vomiting and Nausea Only    Drug induced pancreatitis   . Lyrica [Pregabalin] Nausea And Vomiting  . Adhesive [Tape] Rash    Please use paper tape Patient is allergic to many adhesives   . Bactrim [Sulfamethoxazole-Trimethoprim] Other (See Comments)    May have caused kidney issues  . Drixoral [Brompheniramine-Pseudoeph] Rash  . Erythromycin Other (See Comments)    GI- Upset / severe abdominal pain  . Minocin [Minocycline Hcl] Nausea And Vomiting and Rash  . Septra [Sulfamethoxazole-Trimethoprim] Rash  . Sinutab [Chlorphen-Pseudoephed-Apap] Rash    PHYSICAL EXAM:  Performance status (ECOG): 1 - Symptomatic but completely ambulatory  Vitals:   02/09/21 1202  BP: 126/81  Pulse: 73  Resp: 18  Temp: 97.8 F (36.6 C)  SpO2: 100%   Wt Readings from Last 3 Encounters:  02/09/21 229 lb (103.9 kg)  01/31/21 223 lb 15.8 oz (101.6 kg)  01/10/21 223 lb 15.8 oz (101.6 kg)    Physical Exam Vitals reviewed.  Constitutional:      Appearance: Normal appearance. She is obese.  Cardiovascular:     Rate and Rhythm: Normal rate and regular rhythm.     Pulses: Normal pulses.     Heart sounds: Normal heart sounds.  Pulmonary:     Effort: Pulmonary effort is normal.     Breath sounds: Normal breath sounds.  Neurological:     General: No focal deficit present.     Mental Status: She is alert and oriented to person, place, and time.  Psychiatric:        Mood and Affect: Mood normal.        Behavior: Behavior normal.     LABORATORY DATA:  I have reviewed the labs as listed.  CBC Latest Ref Rng & Units 01/31/2021 01/16/2021 12/20/2020  WBC 4.0 - 10.5 K/uL 6.1 5.9 5.3  Hemoglobin 12.0 - 15.0 g/dL 14.5 12.9 13.2  Hematocrit 36.0 - 46.0 % 43.0 39.3 40.6  Platelets 150 - 400 K/uL 203 188 190   CMP Latest Ref Rng & Units 01/16/2021 01/06/2021 12/20/2020  Glucose 70 - 99 mg/dL 103(H) 120(H) 98  BUN 6 - 20 mg/dL 20  21(H) 21(H)  Creatinine 0.44 - 1.00 mg/dL 0.85 0.88 0.98  Sodium 135 - 145 mmol/L 135 141 141  Potassium 3.5 - 5.1 mmol/L 3.6 3.3(L) 3.3(L)  Chloride 98 - 111 mmol/L 107 110 110  CO2 22 - 32 mmol/L 18(L) 21(L) 21(L)  Calcium 8.9 - 10.3 mg/dL 9.0 8.9 8.8(L)  Total Protein 6.5 - 8.1 g/dL 7.2 - 7.2  Total Bilirubin 0.3 - 1.2 mg/dL 0.3 - 0.3  Alkaline Phos 38 - 126 U/L 59 - 62  AST 15 - 41 U/L 21 - 22  ALT 0 - 44 U/L 25 - 27      Component Value Date/Time   RBC 4.60 01/31/2021 0730   MCV 93.5 01/31/2021 0730   MCV 95.0 08/31/2015 1217   MCH 31.5 01/31/2021 0730   MCHC 33.7 01/31/2021 0730   RDW 12.2 01/31/2021 0730   RDW 13.5 08/31/2015 1217   LYMPHSABS 1.9 01/31/2021 0730   LYMPHSABS 2.3 08/31/2015 1217   MONOABS 0.7 01/31/2021 0730   MONOABS 0.5 08/31/2015 1217   EOSABS 0.3 01/31/2021 0730   EOSABS 0.1 08/31/2015 1217   BASOSABS 0.0 01/31/2021 0730   BASOSABS 0.0 08/31/2015 1217    DIAGNOSTIC IMAGING:  I have independently reviewed the scans  and discussed with the patient. NM Bone Scan Whole Body  Result Date: 01/13/2021 CLINICAL DATA:  Hematologic malignancy. History of breast carcinoma in renal cell carcinoma EXAM: NUCLEAR MEDICINE WHOLE BODY BONE SCAN TECHNIQUE: Whole body anterior and posterior images were obtained approximately 3 hours after intravenous injection of radiopharmaceutical. RADIOPHARMACEUTICALS:  85.6 millicurie mCi [id number] MDP IV COMPARISON:  CT abdomen and pelvis November 25, 2020 FINDINGS: Areas of increased uptake in the right shoulder, wrists, knees, and midfoot regions are likely of arthropathic etiology. Elsewhere, the distribution of bony uptake of radiotracer is unremarkable. Kidneys are noted in the flank positions bilaterally. IMPRESSION: Probable arthropathy at several joints sites. No findings felt to be indicative of bony metastatic disease. Electronically Signed   By: Lowella Grip III M.D.   On: 01/13/2021 09:07   NM PET Image Initial (PI) Whole Body  Result Date: 02/03/2021 CLINICAL DATA:  Initial treatment strategy for mono clonal gammopathy of uncertain significance. Prior history of right breast cancer and history of renal cancer. EXAM: NUCLEAR MEDICINE PET WHOLE BODY TECHNIQUE: 11.2 mCi F-18 FDG was injected intravenously. Full-ring PET imaging was performed from the head to foot after the radiotracer. CT data was obtained and used for attenuation correction and anatomic localization. Fasting blood glucose: 106 mg/dl COMPARISON:  Multiple exams, including CT abdomen 11/25/2020 FINDINGS: Mediastinal blood pool activity: SUV max 2.8 HEAD/NECK: No significant abnormal hypermetabolic activity in this region. Incidental CT findings: none CHEST: No significant abnormal hypermetabolic activity in this region. Incidental CT findings: Clips are noted in the right breast on image 117 series 4. ABDOMEN/PELVIS: No significant abnormal hypermetabolic activity in this region. Incidental CT findings:  Cholecystectomy. Postoperative findings in the left mid kidney with mild adjacent perirenal scarring but no compelling evidence of recurrence. SKELETON: Prior right ACL repair with faint activity along the tibial tunnel thought to be incidental. No hypermetabolic lesion identified. Incidental CT findings: none EXTREMITIES: No significant abnormal hypermetabolic activity in this region. Incidental CT findings: none IMPRESSION: 1. No findings of active malignancy. 2. Postoperative findings along the left mid kidney and in the right breast. Electronically Signed   By: Van Clines M.D.   On: 02/03/2021 15:21   CT BIOPSY  Result Date: 01/31/2021 INDICATION: 55 year old female  with monoclonal gammopathy of undetermined significance and elevated kappa lambda light chain ratio. She presents for CT-guided bone marrow biopsy. EXAM: CT GUIDED BONE MARROW ASPIRATION AND CORE BIOPSY Interventional Radiologist:  Criselda Peaches, MD MEDICATIONS: None. ANESTHESIA/SEDATION: Moderate (conscious) sedation was employed during this procedure. A total of 2 milligrams versed and 100 micrograms fentanyl were administered intravenously. The patient's level of consciousness and vital signs were monitored continuously by radiology nursing throughout the procedure under my direct supervision. Total monitored sedation time: 10 minutes FLUOROSCOPY TIME:  None. COMPLICATIONS: None immediate. Estimated blood loss: <25 mL PROCEDURE: Informed written consent was obtained from the patient after a thorough discussion of the procedural risks, benefits and alternatives. All questions were addressed. Maximal Sterile Barrier Technique was utilized including caps, mask, sterile gowns, sterile gloves, sterile drape, hand hygiene and skin antiseptic. A timeout was performed prior to the initiation of the procedure. The patient was positioned prone and non-contrast localization CT was performed of the pelvis to demonstrate the iliac marrow  spaces. Maximal barrier sterile technique utilized including caps, mask, sterile gowns, sterile gloves, large sterile drape, hand hygiene, and betadine prep. Under sterile conditions and local anesthesia, an 11 gauge coaxial bone biopsy needle was advanced into the right iliac marrow space. Needle position was confirmed with CT imaging. Initially, bone marrow aspiration was performed. Next, the 11 gauge outer cannula was utilized to obtain a right iliac bone marrow core biopsy. Needle was removed. Hemostasis was obtained with compression. The patient tolerated the procedure well. Samples were prepared with the cytotechnologist. IMPRESSION: Technically successful CT-guided aspiration and core biopsy of the right iliac bone. Electronically Signed   By: Jacqulynn Cadet M.D.   On: 01/31/2021 10:56   CT BONE MARROW BIOPSY & ASPIRATION  Result Date: 01/31/2021 INDICATION: 55 year old female with monoclonal gammopathy of undetermined significance and elevated kappa lambda light chain ratio. She presents for CT-guided bone marrow biopsy. EXAM: CT GUIDED BONE MARROW ASPIRATION AND CORE BIOPSY Interventional Radiologist:  Criselda Peaches, MD MEDICATIONS: None. ANESTHESIA/SEDATION: Moderate (conscious) sedation was employed during this procedure. A total of 2 milligrams versed and 100 micrograms fentanyl were administered intravenously. The patient's level of consciousness and vital signs were monitored continuously by radiology nursing throughout the procedure under my direct supervision. Total monitored sedation time: 10 minutes FLUOROSCOPY TIME:  None. COMPLICATIONS: None immediate. Estimated blood loss: <25 mL PROCEDURE: Informed written consent was obtained from the patient after a thorough discussion of the procedural risks, benefits and alternatives. All questions were addressed. Maximal Sterile Barrier Technique was utilized including caps, mask, sterile gowns, sterile gloves, sterile drape, hand hygiene and  skin antiseptic. A timeout was performed prior to the initiation of the procedure. The patient was positioned prone and non-contrast localization CT was performed of the pelvis to demonstrate the iliac marrow spaces. Maximal barrier sterile technique utilized including caps, mask, sterile gowns, sterile gloves, large sterile drape, hand hygiene, and betadine prep. Under sterile conditions and local anesthesia, an 11 gauge coaxial bone biopsy needle was advanced into the right iliac marrow space. Needle position was confirmed with CT imaging. Initially, bone marrow aspiration was performed. Next, the 11 gauge outer cannula was utilized to obtain a right iliac bone marrow core biopsy. Needle was removed. Hemostasis was obtained with compression. The patient tolerated the procedure well. Samples were prepared with the cytotechnologist. IMPRESSION: Technically successful CT-guided aspiration and core biopsy of the right iliac bone. Electronically Signed   By: Jacqulynn Cadet M.D.   On: 01/31/2021  10:56     ASSESSMENT:  1.  Right breast high-grade DCIS: -Status post right lumpectomy and sentinel lymph node biopsy on 10/03/2015 by Dr. Brantley Stage.  0.4 cm high-grade DCIS, 0/1 lymph nodes positive, ER/PR negative. -She underwent radiation therapy in the adjuvant setting. -On 05/01/2017 she underwent left breast biopsy upper outer quadrant-fibroadenoma.  Left breast upper inner quadrant biopsy showed fibrocystic change. -Today's physical examination did not show any palpable masses.  Stable lumpectomy scar in the inferior margin of the areola. -Last mammogram on 04/05/2020 was BI-RADS Category 2 benign. -Genetic testing was negative.   2.  Elevated free light chain ratio: -She has elevated free kappa light chains with elevated ratio.  SPEP and immunofixation were negative. - Bone marrow biopsy was negative for plasma cell neoplasm. - PET scan on 02/02/2021 did not show any lytic lesions.  3.  Left kidney  clear-cell renal cell carcinoma: -Status post partial nephrectomy on 06/19/2018, RCC, clear cell type, grade 2, 1.5 cm, pT1a, negative margins.   PLAN:  1. Right breast high-grade DCIS: - No evidence of recurrence at this time. - Recommend mammogram after 04/10/2021.  2. Elevated free light chain ratio: -Recently her kappa light chains and ratio has shot up significantly.  We reviewed results of PET scan from 02/02/2021 which was negative. - We also reviewed bone marrow biopsy findings which were negative for plasma cell disorder. - She has neuropathy which is stable. - We will consider repeating myeloma labs in 6 months and follow-up.  3. Left kidney clear-cell renal cell carcinoma: - PET scan on 02/02/2021 did not reveal any evidence of recurrence. - Continue follow-up with urology.  Orders placed this encounter:  Orders Placed This Encounter  Procedures  . CBC with Differential/Platelet  . Comprehensive metabolic panel  . Kappa/lambda light chains  . Immunofixation electrophoresis  . Protein electrophoresis, serum     Derek Jack, MD [place] [phone]   I, Milinda Antis, am acting as a scribe for Dr. Sanda Linger.  I, Derek Jack MD, have reviewed the above documentation for accuracy and completeness, and I agree with the above.

## 2021-02-10 ENCOUNTER — Encounter (HOSPITAL_COMMUNITY): Payer: Self-pay | Admitting: Hematology

## 2021-02-13 LAB — SURGICAL PATHOLOGY

## 2021-02-23 ENCOUNTER — Encounter: Payer: Self-pay | Admitting: Gastroenterology

## 2021-03-06 ENCOUNTER — Other Ambulatory Visit: Payer: Self-pay | Admitting: Gastroenterology

## 2021-03-06 DIAGNOSIS — R110 Nausea: Secondary | ICD-10-CM

## 2021-03-07 NOTE — Telephone Encounter (Signed)
Please call patient to see if she needs refill on Zofran. Hopefully, she is feeling improved since her last visit.

## 2021-03-08 ENCOUNTER — Other Ambulatory Visit: Payer: Self-pay | Admitting: Neurology

## 2021-03-08 NOTE — Telephone Encounter (Signed)
Phoned and spoke with the pt advised yes she needed a refill. She was on the books to be seen in June but got a letter that her appt had been moved to Sept 12th @ 3 pm. States she is having another gallbladder attack with a lot of nausea, even though she said she doesn't a gallbladder.

## 2021-03-08 NOTE — Telephone Encounter (Signed)
Noted. Rx sent.   Please have patient placed on cancellation list for sooner appointment.

## 2021-03-09 NOTE — Telephone Encounter (Signed)
Phoned and advised the pt that her Rx has been phoned in

## 2021-03-13 ENCOUNTER — Encounter (HOSPITAL_COMMUNITY): Payer: Self-pay | Admitting: Hematology

## 2021-03-26 ENCOUNTER — Other Ambulatory Visit: Payer: Self-pay | Admitting: Neurology

## 2021-04-10 ENCOUNTER — Ambulatory Visit (HOSPITAL_COMMUNITY): Payer: Medicare HMO

## 2021-04-12 ENCOUNTER — Other Ambulatory Visit: Payer: Self-pay | Admitting: Gastroenterology

## 2021-04-12 ENCOUNTER — Ambulatory Visit: Payer: Medicare HMO | Admitting: Gastroenterology

## 2021-04-12 DIAGNOSIS — K219 Gastro-esophageal reflux disease without esophagitis: Secondary | ICD-10-CM

## 2021-04-12 DIAGNOSIS — R1010 Upper abdominal pain, unspecified: Secondary | ICD-10-CM

## 2021-05-25 ENCOUNTER — Telehealth (HOSPITAL_COMMUNITY): Payer: Self-pay | Admitting: *Deleted

## 2021-05-25 NOTE — Telephone Encounter (Signed)
Husband COVID positive and patient is now exhibiting symptoms.  Has not tested yet. Is opposed to taking paxlovid and was inquiring about monoclonal antibodies if she were to show positive.  Will be following up with PCP.

## 2021-05-25 NOTE — Telephone Encounter (Signed)
Strike note!  Made in error.

## 2021-06-12 ENCOUNTER — Ambulatory Visit (HOSPITAL_COMMUNITY): Payer: Medicare HMO

## 2021-07-03 ENCOUNTER — Ambulatory Visit: Payer: Medicare HMO | Admitting: Gastroenterology

## 2021-08-04 ENCOUNTER — Other Ambulatory Visit: Payer: Self-pay

## 2021-08-04 ENCOUNTER — Inpatient Hospital Stay (HOSPITAL_COMMUNITY): Payer: Medicare HMO | Attending: Hematology

## 2021-08-04 DIAGNOSIS — Z905 Acquired absence of kidney: Secondary | ICD-10-CM | POA: Diagnosis not present

## 2021-08-04 DIAGNOSIS — E538 Deficiency of other specified B group vitamins: Secondary | ICD-10-CM | POA: Diagnosis not present

## 2021-08-04 DIAGNOSIS — Z801 Family history of malignant neoplasm of trachea, bronchus and lung: Secondary | ICD-10-CM | POA: Diagnosis not present

## 2021-08-04 DIAGNOSIS — D472 Monoclonal gammopathy: Secondary | ICD-10-CM | POA: Insufficient documentation

## 2021-08-04 DIAGNOSIS — Z86000 Personal history of in-situ neoplasm of breast: Secondary | ICD-10-CM | POA: Diagnosis present

## 2021-08-04 DIAGNOSIS — Z800 Family history of malignant neoplasm of digestive organs: Secondary | ICD-10-CM | POA: Insufficient documentation

## 2021-08-04 DIAGNOSIS — Z85528 Personal history of other malignant neoplasm of kidney: Secondary | ICD-10-CM | POA: Diagnosis present

## 2021-08-04 DIAGNOSIS — Z803 Family history of malignant neoplasm of breast: Secondary | ICD-10-CM | POA: Insufficient documentation

## 2021-08-04 DIAGNOSIS — Z808 Family history of malignant neoplasm of other organs or systems: Secondary | ICD-10-CM | POA: Diagnosis not present

## 2021-08-04 LAB — CBC WITH DIFFERENTIAL/PLATELET
Abs Immature Granulocytes: 0.01 10*3/uL (ref 0.00–0.07)
Basophils Absolute: 0 10*3/uL (ref 0.0–0.1)
Basophils Relative: 0 %
Eosinophils Absolute: 0.1 10*3/uL (ref 0.0–0.5)
Eosinophils Relative: 2 %
HCT: 41.6 % (ref 36.0–46.0)
Hemoglobin: 14 g/dL (ref 12.0–15.0)
Immature Granulocytes: 0 %
Lymphocytes Relative: 30 %
Lymphs Abs: 1.7 10*3/uL (ref 0.7–4.0)
MCH: 32.1 pg (ref 26.0–34.0)
MCHC: 33.7 g/dL (ref 30.0–36.0)
MCV: 95.4 fL (ref 80.0–100.0)
Monocytes Absolute: 0.5 10*3/uL (ref 0.1–1.0)
Monocytes Relative: 9 %
Neutro Abs: 3.2 10*3/uL (ref 1.7–7.7)
Neutrophils Relative %: 59 %
Platelets: 192 10*3/uL (ref 150–400)
RBC: 4.36 MIL/uL (ref 3.87–5.11)
RDW: 13.1 % (ref 11.5–15.5)
WBC: 5.6 10*3/uL (ref 4.0–10.5)
nRBC: 0 % (ref 0.0–0.2)

## 2021-08-04 LAB — COMPREHENSIVE METABOLIC PANEL
ALT: 29 U/L (ref 0–44)
AST: 23 U/L (ref 15–41)
Albumin: 4.2 g/dL (ref 3.5–5.0)
Alkaline Phosphatase: 69 U/L (ref 38–126)
Anion gap: 7 (ref 5–15)
BUN: 20 mg/dL (ref 6–20)
CO2: 22 mmol/L (ref 22–32)
Calcium: 9.1 mg/dL (ref 8.9–10.3)
Chloride: 110 mmol/L (ref 98–111)
Creatinine, Ser: 0.96 mg/dL (ref 0.44–1.00)
GFR, Estimated: >60 mL/min (ref >60)
Glucose, Bld: 97 mg/dL (ref 70–99)
Potassium: 3.4 mmol/L — ABNORMAL LOW (ref 3.5–5.1)
Sodium: 139 mmol/L (ref 135–145)
Total Bilirubin: 0.4 mg/dL (ref 0.3–1.2)
Total Protein: 7.7 g/dL (ref 6.5–8.1)

## 2021-08-07 ENCOUNTER — Other Ambulatory Visit: Payer: Self-pay | Admitting: Urology

## 2021-08-07 LAB — PROTEIN ELECTROPHORESIS, SERUM
A/G Ratio: 1.1 (ref 0.7–1.7)
Albumin ELP: 3.9 g/dL (ref 2.9–4.4)
Alpha-1-Globulin: 0.2 g/dL (ref 0.0–0.4)
Alpha-2-Globulin: 0.8 g/dL (ref 0.4–1.0)
Beta Globulin: 1.3 g/dL (ref 0.7–1.3)
Gamma Globulin: 1.1 g/dL (ref 0.4–1.8)
Globulin, Total: 3.4 g/dL (ref 2.2–3.9)
Total Protein ELP: 7.3 g/dL (ref 6.0–8.5)

## 2021-08-07 LAB — KAPPA/LAMBDA LIGHT CHAINS
Kappa free light chain: 35.5 mg/L — ABNORMAL HIGH (ref 3.3–19.4)
Kappa, lambda light chain ratio: 2.02 — ABNORMAL HIGH (ref 0.26–1.65)
Lambda free light chains: 17.6 mg/L (ref 5.7–26.3)

## 2021-08-09 LAB — IMMUNOFIXATION ELECTROPHORESIS
IgA: 513 mg/dL — ABNORMAL HIGH (ref 87–352)
IgG (Immunoglobin G), Serum: 1009 mg/dL (ref 586–1602)
IgM (Immunoglobulin M), Srm: 86 mg/dL (ref 26–217)
Total Protein ELP: 7.4 g/dL (ref 6.0–8.5)

## 2021-08-11 ENCOUNTER — Other Ambulatory Visit: Payer: Self-pay

## 2021-08-11 ENCOUNTER — Inpatient Hospital Stay (HOSPITAL_COMMUNITY): Payer: Medicare HMO | Admitting: Physician Assistant

## 2021-08-11 VITALS — BP 133/91 | HR 72 | Temp 98.4°F | Resp 18 | Wt 236.5 lb

## 2021-08-11 DIAGNOSIS — D472 Monoclonal gammopathy: Secondary | ICD-10-CM | POA: Diagnosis not present

## 2021-08-11 DIAGNOSIS — C50311 Malignant neoplasm of lower-inner quadrant of right female breast: Secondary | ICD-10-CM | POA: Diagnosis not present

## 2021-08-11 DIAGNOSIS — C642 Malignant neoplasm of left kidney, except renal pelvis: Secondary | ICD-10-CM

## 2021-08-11 DIAGNOSIS — Z171 Estrogen receptor negative status [ER-]: Secondary | ICD-10-CM

## 2021-08-11 NOTE — Progress Notes (Signed)
[facility] [address] [place], [place] [address]   CLINIC:  Medical Oncology/Hematology  PCP:  Pablo Lawrence, NP [address] [place] [address] [phone]   REASON FOR VISIT:  Follow-up for MGUS, history of breast cancer, history of left kidney clear-cell renal cell carcinoma  PRIOR THERAPY: - Right breast cancer: Lumpectomy and SLN biopsy, radiation - Left kidney clear-cell renal cell carcinoma: Partial nephrectomy - MGUS: None  CURRENT THERAPY: - Right breast cancer: Surveillance - Left kidney clear-cell renal cell carcinoma: Surveillance - MGUS: Surveillance  BRIEF ONCOLOGIC HISTORY:  Oncology History Overview Note  Stage 0: Right breast DCIS, high-grade, ER/PR negative Clear Cell RCC   Breast cancer of lower-inner quadrant of right female breast ([place])  08/19/2015 Breast US   Masslike asymmetry within the lower inner quadrant of the right breast, at posterior depth, with associated microcalcifications which are new.    08/23/2015 Initial Biopsy   AT LEAST HIGH DUCTAL CARCINOMA IN SITU   08/23/2015 Receptors her2   Estrogen Receptor: 0%, NEGATIVE Progesterone Receptor: 0%, NEGATIVE   08/26/2015 Initial Diagnosis   Breast cancer of lower-inner quadrant of right female breast ([place])   08/31/2015 Procedure   [facility] negative.  Genes tested include: ATM, BRCA1, BRCA2, CDH1, CHEK2, PALB2, PTEN, & TP53.    10/13/2015 Pathologic Stage   pTis, pN0: Stage 0    10/13/2015 Surgery   Right lumpectomy & SLNB ([name]). High grade DCIS, 0.4 cm. Negative margins.  1 right axillary SLN neg. ER/PR repeated and both remain negative.    11/17/2015 - 12/21/2015 Radiation Therapy   Treated at [facility] [place]). Right breast: Total dose 50 Gy in 25 fractions. 3-D tangents; 6 MV photons.    08/22/2018 Genetic Testing   Negative genetic testing on the CancerNExt Expanded+RNA insight panel.  The CancerNext-Expanded gene panel offered by  [facility] and includes sequencing and rearrangement analysis for the following 67 genes: AIP, ALK, APC*, ATM*, BAP1, BARD1, BLM, BMPR1A, BRCA1*, BRCA2*, BRIP1*, CDH1*, CDK4, CDKN1B, CDKN2A, CHEK2*, DICER1, FANCC, FH, FLCN, GALNT12, HOXB13, MAX, MEN1, MET, MLH1*, MRE11A, MSH2*, MSH6*, MUTYH*, NBN, NF1*, NF2, PALB2*, PHOX2B, PMS2*, POLD1, POLE, POT1, PRKAR1A, PTCH1, PTEN*, RAD50, RAD51C*, RAD51D*, RB1, RET, SDHA, SDHAF2, SDHB, SDHC, SDHD, SMAD4, SMARCA4, SMARCB1, SMARCE1, STK11, SUFU, TMEM127, TP53*, TSC1, TSC2, VHL and XRCC2 (sequencing and deletion/duplication); MITF (sequencing only); EPCAM and GREM1 (deletion/duplication only). DNA and RNA analyses performed for * genes. The report date is 08/22/2018.   Renal cancer, left ([place])  07/31/2018 Initial Diagnosis   Renal cancer, left ([place])   08/22/2018 Genetic Testing   Negative genetic testing on the CancerNExt Expanded+RNA insight panel.  The CancerNext-Expanded gene panel offered by [facility] and includes sequencing and rearrangement analysis for the following 67 genes: AIP, ALK, APC*, ATM*, BAP1, BARD1, BLM, BMPR1A, BRCA1*, BRCA2*, BRIP1*, CDH1*, CDK4, CDKN1B, CDKN2A, CHEK2*, DICER1, FANCC, FH, FLCN, GALNT12, HOXB13, MAX, MEN1, MET, MLH1*, MRE11A, MSH2*, MSH6*, MUTYH*, NBN, NF1*, NF2, PALB2*, PHOX2B, PMS2*, POLD1, POLE, POT1, PRKAR1A, PTCH1, PTEN*, RAD50, RAD51C*, RAD51D*, RB1, RET, SDHA, SDHAF2, SDHB, SDHC, SDHD, SMAD4, SMARCA4, SMARCB1, SMARCE1, STK11, SUFU, TMEM127, TP53*, TSC1, TSC2, VHL and XRCC2 (sequencing and deletion/duplication); MITF (sequencing only); EPCAM and GREM1 (deletion/duplication only). DNA and RNA analyses performed for * genes. The report date is 08/22/2018.     CANCER STAGING: Cancer Staging Breast cancer of lower-inner quadrant of right female breast [facility]) Staging form: Breast, AJCC 7th Edition - Clinical stage from 08/31/2015: Stage 0 (  Tis (DCIS), N0, M0) - Unsigned - Pathologic stage from 10/13/2015: Stage 0 (Tis  (DCIS), N0, cM0) - Signed by Holley Bouche, NP on 02/09/2016   INTERVAL HISTORY:  Ms. MARZELL ALLEMAND, a 55 y.o. female, returns for routine follow-up of her MGUS, right-sided breast cancer, and history of left kidney renal clear cell carcinoma. Vala was last seen on 02/09/2021 by Dr. Delton Coombes.   At today's visit, she reports feeling somewhat poorly.  She denies any recent hospitalizations, surgeries, or changes in her baseline health status.  Most recent mammogram (07/11/2021 at [facility]) BI-RADS Category 2, benign. She has not noticed any new breast lumps or other concerning lumps or masses. Admits to mild dyspnea on exertion secondary to physical deconditioning.  No chest pain. Reports chronic abdominal pain related to her IBS. No new onset bone pain, but she does have chronic osteoarthritis and pain related to foot fracture. No new neurological deficits.  She does have chronic peripheral neuropathy. No B symptoms such as fever, chills, night sweats, unintentional weight loss.  Regarding her history of left renal clear cell carcinoma, she saw her PCP on 07/24/2021 due to the development of gross hematuria, bilateral flank pain (R > L), and suprapubic pressure.  CT of abdomen/pelvis (07/24/2021 at [facility]) showed 18 mm enhancing solid lesion projecting off the right posterior midpole cortex of right kidney.  MRI abdomen (07/28/2021 at [facility]) showed 1.8 x 1.7 cm right renal lesion that has a significant internal heterogeneity and appears to have mild enhancement; lesion was present dating back to 01/30/2018 and appeared more homogeneously cystic at that time; lesion may reflect a papillary renal cell carcinoma per radiologist interpretation.  She has already been seen by urologist (Dr. Louis Meckel), and is scheduled for right partial nephrectomy on 10/06/2021.    She reports 40% energy and 60% appetite.  She is maintaining stable weight at this time.   REVIEW OF  SYSTEMS:  Review of Systems  Constitutional:  Positive for appetite change and fatigue. Negative for chills, diaphoresis, fever and unexpected weight change.  HENT:   Negative for lump/mass and nosebleeds.   Eyes:  Negative for eye problems.  Respiratory:  Positive for shortness of breath (With exertion). Negative for cough and hemoptysis.   Cardiovascular:  Negative for chest pain, leg swelling and palpitations.  Gastrointestinal:  Positive for constipation, diarrhea and nausea. Negative for abdominal pain, blood in stool and vomiting.  Genitourinary:  Positive for hematuria and pelvic pain.   Skin: Negative.   Neurological:  Positive for headaches and numbness. Negative for dizziness and light-headedness.  Hematological:  Does not bruise/bleed easily.  Psychiatric/Behavioral:  Positive for depression and sleep disturbance. The patient is nervous/anxious.    PAST MEDICAL/SURGICAL HISTORY:  Past Medical History:  Diagnosis Date   Allergy    seasonal   Anxiety    Arthritis    Balance problem    Brain aneurysm    2 mm ACA region aneurysm by 09/21/15 MRA   Breast cancer [facility])    Breast cancer of lower-inner quadrant of right female breast ([place]) 08/26/2015   Chronic kidney disease    acute renal failure one occasion   Chronic low back pain 3/82/5053   Complication of anesthesia    heart rate drops and O2 sats drop   Constipation    Depression    Diplopia 10/29/2017   Dizziness 10/29/2017   Elevated liver function tests    Family history of adverse reaction to anesthesia  mom has n/v   Family history of breast cancer    Family history of colon cancer    Fibromyalgia    Fibromyalgia    Gait abnormality 10/29/2017   Genetic testing 09/08/2015   Negative genetic testing on the Breast/High Moderate Risk panel. The Breast High/Moderate Risk gene panel offered by [facility] includes sequencing and deletion/duplication analysis of the following 9 genes: ATM, BRCA1, BRCA2, CDH1, CHEK2, PALB2,  PTEN, STK11, and TP53. The report date is September 08, 2015.  The Rest of the Comprehensive Cancer panel has been reflexed and will be available in 2-3 weeks.  POLD1 c.208G>T VUS found on the Remainder of the Comprehensive cancer panel.  The Comprehensive Cancer Panel offered by [facility] includes sequencing and/or deletion duplication testing of the following 32 genes: APC, ATM, AXIN2, BARD1, BMPR1A, BRCA1, BRCA2, BRIP1, CDH1, CDK4, CDKN2A, CHEK2, EPCAM, FANCC, MLH1, MSH2, MSH6, MUTYH, NBN, PALB2, PMS2, POLD1, POLE, PTEN, RAD51C, RAD51D, SCG5/GREM1, SMAD4, STK11, TP53, VHL, and XRCC2.   The report date is 09/12/2015.    GERD (gastroesophageal reflux disease)    none recent   H/O acute renal failure 09/22/15   admitted to [facility]   Headache    occipital and temporal   History of hiatal hernia    Hyperlipemia    Hyperlipidemia    Hypertension    Irritable bowel syndrome (IBS)    Lyme disease    MGUS (monoclonal gammopathy of unknown significance) 03/15/2016   Neuromuscular disorder (HCC)    fibromyalgia   Nocturnal myoclonus    Occasional tremors    OSA (obstructive sleep apnea)    uses cpap with humidification- auto    Personal history of radiation therapy    PONV (postoperative nausea and vomiting)    Renal cell carcinoma (HCC)    TIA (transient ischemic attack) 2021   Tremor, essential 10/29/2017   Past Surgical History:  Procedure Laterality Date   ABDOMINAL HYSTERECTOMY     adenomyosis   ABDOMINAL HYSTERECTOMY     BIOPSY  01/10/2021   Procedure: BIOPSY;  Surgeon: Eloise Harman, DO;  [facility];  Service: Endoscopy;;   BLADDER SURGERY     BONE GRAFT HIP ILIAC CREST     BREAST BIOPSY Bilateral    BREAST LUMPECTOMY     BREAST SURGERY     CHOLECYSTECTOMY     COLONOSCOPY WITH PROPOFOL  05/14/2016   [facility]; entire examined colon normal.  Recommendations to repeat colonoscopy in 10 years for screening purposes.   COLONOSCOPY WITH PROPOFOL N/A 01/10/2021    Procedure: COLONOSCOPY WITH PROPOFOL;  Surgeon: Eloise Harman, DO;  [facility];  Service: Endoscopy;  Laterality: N/A;  pm appt   ESOPHAGOGASTRODUODENOSCOPY (EGD) WITH PROPOFOL  03/24/2018   [facility]; normal esophagus, erythematous mucosa in the gastric body and antrum s/p biopsy, normal examined duodenum s/p biopsy.  Pathology with benign duodenal biopsy.  Stomach biopsies with chronic inactive gastritis, negative for H. pylori.   ESOPHAGOGASTRODUODENOSCOPY (EGD) WITH PROPOFOL N/A 01/10/2021   Procedure: ESOPHAGOGASTRODUODENOSCOPY (EGD) WITH PROPOFOL;  Surgeon: Eloise Harman, DO;  [facility];  Service: Endoscopy;  Laterality: N/A;   FRACTURE SURGERY Right    wrist   KNEE ARTHROSCOPY     KNEE ARTHROSCOPY W/ ACL RECONSTRUCTION     right   POLYPECTOMY  01/10/2021   Procedure: POLYPECTOMY;  Surgeon: Eloise Harman, DO;  [facility];  Service: Endoscopy;;   RADIOACTIVE SEED GUIDED PARTIAL MASTECTOMY WITH AXILLARY  SENTINEL LYMPH NODE BIOPSY Right 10/13/2015   Procedure: RADIOACTIVE SEED GUIDED PARTIAL MASTECTOMY WITH AXILLARY SENTINEL LYMPH NODE BIOPSY;  Surgeon: Erroll Luna, MD;  Location: [facility];  Service: General;  Laterality: Right;   right carpal and cubital tunnel release      ROBOTIC ASSITED PARTIAL NEPHRECTOMY Left 06/19/2018   Procedure: XI ROBOTIC ASSITED PARTIAL NEPHRECTOMY;  Surgeon: Ardis Hughs, MD;  Location: [facility];  Service: Urology;  Laterality: Left;   WRIST SURGERY      SOCIAL HISTORY:  Social History   Socioeconomic History   Marital status: Married    Spouse name: [name]   Number of children: 2   Years of education: 12   Highest education level: Not on file  Occupational History   Occupation: physician office  Tobacco Use   Smoking status: Never   Smokeless tobacco: Never  Vaping Use   Vaping Use: Never used  Substance and Sexual Activity   Alcohol use: Not Currently    Comment: maybe one a year   Drug use:  Never   Sexual activity: Yes    Birth control/protection: Surgical  Other Topics Concern   Not on file  Social History Narrative   ** Merged History Encounter **       Lives with husband Husband on disability Children grown and moved worries about family illness- mother and sister Caffeine use:     Social Determinants of Radio broadcast assistant Strain: Not on file  Food Insecurity: Not on file  Transportation Needs: Not on file  Physical Activity: Not on file  Stress: Not on file  Social Connections: Not on file  Intimate Partner Violence: Not on file    FAMILY HISTORY:  Family History  Problem Relation Age of Onset   Hypertension Mother    Autoimmune disease Mother        lichen planus   Eczema Mother    Parkinson's disease Mother    Mental illness Mother        bipolar   Heart disease Maternal Aunt    Lung cancer Paternal Aunt    Breast cancer Paternal Aunt        dx in his 52s   Parkinsonism Paternal Aunt    Lung cancer Paternal Grandfather    Colon cancer Paternal Uncle        dx in his 38s   Colon cancer Paternal Uncle        dx in his 13s   Melanoma Father 25   Psoriasis Father    Hypertension Father    Heart disease Father    Breast cancer Sister        dx in her 28s   Lupus Sister    COPD Maternal Uncle    Skin cancer Maternal Uncle    Colon cancer Paternal Uncle        dx in his 4s   Mental illness Son        bipolar   Polycystic ovary syndrome Other     CURRENT MEDICATIONS:  Current Outpatient Medications  Medication Sig Dispense Refill   ALPRAZolam (XANAX) 1 MG tablet Take 1 mg by mouth at bedtime.     cyanocobalamin (,VITAMIN B-12,) 1000 MCG/ML injection Inject 1 mL (1,000 mcg total) into the muscle every 30 (thirty) days. 10 each 5   diclofenac Sodium (VOLTAREN) 1 % GEL Apply 1 gram topically 4 times a day to affected area as needed     diltiazem (CARDIZEM CD) 180  MG 24 hr capsule TAKE ONE CAPSULE BY MOUTH DAILY (Patient taking  differently: Take 180 mg by mouth daily.) 90 capsule 3   FLUoxetine (PROZAC) 20 MG capsule Take 60 mg by mouth daily.  12   furosemide (LASIX) 20 MG tablet Take 20 mg by mouth daily as needed for edema.     HYDROcodone-acetaminophen (NORCO) 7.5-325 MG tablet Take 0.5 tablets by mouth 3 (three) times daily as needed for severe pain (Takes 0.5 tablet  at bedtime).     hyoscyamine (LEVSIN SL) 0.125 MG SL tablet Place under the tongue every 4 (four) hours.     linaclotide (LINZESS) 72 MCG capsule Take 72 mcg by mouth daily before breakfast. (Patient not taking: Reported on 02/09/2021)     losartan (COZAAR) 25 MG tablet Take 25 mg by mouth daily.  0   ondansetron (ZOFRAN) 4 MG tablet TAKE 1 TABLET BY MOUTH EVERY 8 HOURS AS NEEDED FOR FOR NAUSEA OR VOMITING 20 tablet 1   OVER THE COUNTER MEDICATION Take 1 capsule by mouth daily as needed (nausea). Diphenhydramate Over The Counter Anti-emetic     pantoprazole (PROTONIX) 40 MG tablet TAKE ONE TABLET BY MOUTH TWICE A DAY 180 tablet 3   potassium chloride SA (KLOR-CON) 20 MEQ tablet Take by mouth.     pramipexole (MIRAPEX) 0.125 MG tablet TAKE ONE TAB BY MOUTH DAILY AT DINNERTIME AND TAKE TWO TABS BY MOUTH AT BEDTIME. PLEASE CALL [phone] TO SCHEDULE APPT. 90 tablet 1   pravastatin (PRAVACHOL) 10 MG tablet Take 10 mg by mouth daily.     propranolol (INDERAL) 60 MG tablet Take 60 mg by mouth daily.     topiramate (TOPAMAX) 50 MG tablet Take 2 in the morning, take 3 at night (Patient taking differently: Take 100-150 mg by mouth See admin instructions. Take 100 mg in the morning, take 150 mg at night) 440 tablet 1   Vitamin D, Ergocalciferol, (DRISDOL) 50000 units CAPS capsule Take 50,000 Units by mouth every 7 (seven) days. Take 1 tablet (50,000 units) by mouth every Thursday     Wheat Dextrin (BENEFIBER PO) Take 1 Scoop by mouth daily.     No current facility-administered medications for this visit.    ALLERGIES:  Allergies  Allergen Reactions   Ramipril  Nausea And Vomiting and Other (See Comments)    Ramipril--Caused Pancreatitis   Brompheniramine-Pseudoeph Rash   Minocycline Hcl Hives   Other Rash    ADHESIVE GLUE USED FOR INCISIONS  Anti-inflammatory medicine - pt stated, "Upsets my GI tract" Please use paper tape Please use paper tape Please use paper tape Patient is allergic to many adhesives    Altace [Ramipril] Nausea And Vomiting and Nausea Only    Drug induced pancreatitis    Lyrica [Pregabalin] Nausea And Vomiting   Adhesive [Tape] Rash    Please use paper tape Patient is allergic to many adhesives    Bactrim [Sulfamethoxazole-Trimethoprim] Other (See Comments)    May have caused kidney issues   Drixoral [Brompheniramine-Pseudoeph] Rash   Erythromycin Other (See Comments)    GI- Upset / severe abdominal pain   Minocin [Minocycline Hcl] Nausea And Vomiting and Rash   Septra [Sulfamethoxazole-Trimethoprim] Rash   Sinutab [Chlorphen-Pseudoephed-Apap] Rash    PHYSICAL EXAM:  Performance status (ECOG): 1 - Symptomatic but completely ambulatory  There were no vitals filed for this visit. Wt Readings from Last 3 Encounters:  02/09/21 229 lb (103.9 kg)  01/31/21 223 lb 15.8 oz (101.6 kg)  01/10/21 223 lb 15.8  oz (101.6 kg)   Physical Exam Constitutional:      Appearance: Normal appearance. She is obese.  HENT:     Head: Normocephalic and atraumatic.     Mouth/Throat:     Mouth: Mucous membranes are moist.  Eyes:     Extraocular Movements: Extraocular movements intact.     Pupils: Pupils are equal, round, and reactive to light.  Cardiovascular:     Rate and Rhythm: Normal rate and regular rhythm.     Pulses: Normal pulses.     Heart sounds: Normal heart sounds.  Pulmonary:     Effort: Pulmonary effort is normal.     Breath sounds: Normal breath sounds.  Chest:       Comments: Lumpectomy scar beneath right nipple with associated skin puckering.  No discrete nodules or masses palpated in bilateral breast  tissue.  No palpable axillary, supraclavicular, or pectoral lymphadenopathy. Abdominal:     General: Bowel sounds are normal.     Palpations: Abdomen is soft.     Tenderness: There is no abdominal tenderness.  Musculoskeletal:        General: No swelling.     Right lower leg: No edema.     Left lower leg: No edema.  Lymphadenopathy:     Cervical: No cervical adenopathy.  Skin:    General: Skin is warm and dry.  Neurological:     General: No focal deficit present.     Mental Status: She is alert and oriented to person, place, and time.  Psychiatric:        Mood and Affect: Mood normal.        Behavior: Behavior normal.     LABORATORY DATA:  I have reviewed the labs as listed.  CBC Latest Ref Rng & Units 08/04/2021 01/31/2021 01/16/2021  WBC 4.0 - 10.5 K/uL 5.6 6.1 5.9  Hemoglobin 12.0 - 15.0 g/dL 14.0 14.5 12.9  Hematocrit 36.0 - 46.0 % 41.6 43.0 39.3  Platelets 150 - 400 K/uL 192 203 188   CMP Latest Ref Rng & Units 08/04/2021 01/16/2021 01/06/2021  Glucose 70 - 99 mg/dL 97 103(H) 120(H)  BUN 6 - 20 mg/dL 20 20 21(H)  Creatinine 0.44 - 1.00 mg/dL 0.96 0.85 0.88  Sodium 135 - 145 mmol/L 139 135 141  Potassium 3.5 - 5.1 mmol/L 3.4(L) 3.6 3.3(L)  Chloride 98 - 111 mmol/L 110 107 110  CO2 22 - 32 mmol/L 22 18(L) 21(L)  Calcium 8.9 - 10.3 mg/dL 9.1 9.0 8.9  Total Protein 6.5 - 8.1 g/dL 7.7 7.2 -  Total Bilirubin 0.3 - 1.2 mg/dL 0.4 0.3 -  Alkaline Phos 38 - 126 U/L 69 59 -  AST 15 - 41 U/L 23 21 -  ALT 0 - 44 U/L 29 25 -    DIAGNOSTIC IMAGING:  I have independently reviewed the scans and discussed with the patient. No results found.   ASSESSMENT & PLAN: 1.  Right breast high-grade DCIS: -Status post right lumpectomy and sentinel lymph node biopsy on 10/03/2015 by Dr. Brantley Stage.  0.4 cm high-grade DCIS, 0/1 lymph nodes positive, ER/PR negative. - She underwent radiation therapy in the adjuvant setting. - On 05/01/2017 she underwent left breast biopsy upper outer  quadrant-fibroadenoma.  Left breast upper inner quadrant biopsy showed fibrocystic change. - Genetic testing was negative. - Most recent mammogram (07/11/2021 at [facility]) BI-RADS Category 2, benign. -Most recent labs (08/04/2021): Unremarkable CBC and CMP - Physical exam did not reveal any discrete breast lumps or masses.  No axillary lymphadenopathy. - PLAN: Patient instructed to continue annual mammograms via PCP (next due September 2023) - Continue annual breast exam and labs at follow-up visits (next due October 2023)   2.  Elevated free light chain ratio, likely secondary to polyclonal gammopathy - She had elevated free kappa light chains with elevated ratio in August 2020, but with marked increase in light chain ratio 12/20/2020, with ratio increased to 5.26 (was previously 1.77). - Due to rapidly increased free light chain ratio in March 2022, patient had extensive work-up in April 2022 including bone marrow biopsy (negative for plasma cell neoplasm) and PET scan (negative for any lytic lesions).  Urine immunofixation and UPEP were unremarkable.  Bone scan was negative for bony metastases. - Most recent labs (08/04/2021): Free light chains have returned to their previous baseline, with kappa light chains 35.5, lambda 17.6, and light chain ratio 2.02.  Immunofixation shows polyclonal increase in 1 or more immunoglobulins.  SPEP negative for M spike - No CRAB features: Creatinine 0.96, calcium 9.1, Hgb 14.0 - No new bony pains or B symptoms. - Suspect abnormal light chains in the setting of polyclonal gammopathy, likely reactive.  Patient does not have any official autoimmune or rheumatoid diagnoses, but has been diagnosed with fibromyalgia.  She believes that she may have an underlying autoimmune disease that has not yet been diagnosed, based on her chronically elevated ESR and CRP, as well as strong family history of various autoimmune diseases. - PLAN: Free light chain ratios have returned  to their previous levels, no sign of any plasma cell abnormality at this time.  We will repeat MGUS/myeloma labs at follow-up visit in 1 year (October 2023).  3.  History of left renal clear cell carcinoma (s/p left partial nephrectomy), with likely recurrent right renal cell carcinoma - Status post partial nephrectomy on 06/19/2018, RCC, clear cell type, grade 2, 1.5 cm, pT1a, negative margins. - Developed gross hematuria, bilateral flank pain, and suprapubic pressure in October 2022 - CT of abdomen/pelvis (07/24/2021 at [facility]) showed 18 mm enhancing solid lesion projecting off the right posterior midpole cortex of right kidney. - MRI abdomen (07/28/2021 at [facility]) showed 1.8 x 1.7 cm right renal lesion that has a significant internal heterogeneity and appears to have mild enhancement; lesion was present dating back to 01/30/2018 and appeared more homogeneously cystic at that time; lesion may reflect a papillary renal cell carcinoma per radiologist interpretation. - She has already been seen by urologist (Dr. Louis Meckel), and is scheduled for right partial nephrectomy on 10/06/2021.   - PLAN: Continue follow-up with urology for upcoming partial right nephrectomy. - RTC in January 2022 for follow-up visit with Dr. Delton Coombes, 1 month after partial right nephrectomy   4.  B12 deficiency: - Most recent B12 (12/20/2020) showed vitamin B12 deficiency at 179 - Patient currently takes monthly B12 injections at home - PLAN: Continue monthly B12 injections.  We will check B12 and methylmalonic acid at follow-up visit in 1 year (October 2023)   PLAN SUMMARY & DISPOSITION: - Follow-up with DR. [facility] for possible recurrent right renal cell cancer in January 2023 - Follow-up with Tarri Abernethy PA-C for MGUS and history of breast cancer in October 2023.  All questions were answered. The patient knows to call the clinic with any problems, questions or concerns.  Medical decision  making: Moderate  Time spent on visit: I spent 25 minutes counseling the patient face to face. The total time spent in the appointment was  40 minutes and more than 50% was on counseling.   Harriett Rush, PA-C  08/11/2021 2:52 PM

## 2021-08-11 NOTE — Patient Instructions (Signed)
[place] at [facility] Discharge Instructions  You were seen today by Tarri Abernethy PA-C to address the following issues:  HISTORY OF BREAST CANCER: No abnormalities on recent mammogram or on physical exam.  Continue annual mammograms via your primary care provider.  We will continue annual visits for breast exam and ongoing surveillance of your breast cancer history. ELEVATED LIGHT CHAINS: You have previously had elevations in some abnormal proteins.  We have ruled out any cancerous cause of this, but suspect that this is related to underlying inflammation and possible unknown autoimmune/rheumatologic disease. KIDNEY CANCER: Continue follow-up with your urologist for scheduled kidney surgery in December.  We will make an appointment for you to see Dr. Delton Coombes about 1 month after your surgery. B12 DEFICIENCY: Continue monthly B12 injections at home.    Thank you for choosing [place] at [facility] to provide your oncology and hematology care.  To afford each patient quality time with our provider, please arrive at least 15 minutes before your scheduled appointment time.   If you have a lab appointment with the [place] please come in thru the Main Entrance and check in at the main information desk.  You need to re-schedule your appointment should you arrive 10 or more minutes late.  We strive to give you quality time with our providers, and arriving late affects you and other patients whose appointments are after yours.  Also, if you no show three or more times for appointments you may be dismissed from the clinic at the providers discretion.     Again, thank you for choosing [facility].  Our hope is that these requests will decrease the amount of time that you wait before being seen by our physicians.       _____________________________________________________________  Should you have questions after your visit to  [facility], please contact our office at [phone] and follow the prompts.  Our office hours are 8:00 a.m. and 4:30 p.m. Monday - Friday.  Please note that voicemails left after 4:00 p.m. may not be returned until the following business day.  We are closed weekends and major holidays.  You do have access to a nurse 24-7, just call the main number to the clinic [phone] and do not press any options, hold on the line and a nurse will answer the phone.    For prescription refill requests, have your pharmacy contact our office and allow 72 hours.    Due to Covid, you will need to wear a mask upon entering the hospital. If you do not have a mask, a mask will be given to you at the Main Entrance upon arrival. For doctor visits, patients may have 1 support person age 19 or older with them. For treatment visits, patients can not have anyone with them due to social distancing guidelines and our immunocompromised population.

## 2021-08-17 ENCOUNTER — Encounter (HOSPITAL_COMMUNITY): Payer: Self-pay | Admitting: Hematology

## 2021-09-05 ENCOUNTER — Other Ambulatory Visit: Payer: Self-pay | Admitting: Urology

## 2021-09-22 NOTE — Progress Notes (Addendum)
Anesthesia Review:  PCP: Colurtney Keatts LOV 08/29/2021  Cardiologist : Neuro- LOv 09/20/21- Dr Lynnette Caffey for tremors  Chest x-ray : EKG :09/26/21  Echo : Stress test:2019 Cardiac Cath :  Activity level:  Sleep Study/ CPAP : Fasting Blood Sugar :      / Checks Blood Sugar -- times a day:   Blood Thinner/ Instructions /Last Dose: ASA / Instructions/ Last Dose :   On 09/26/21 called and LVMM for Darrel Reach at [facility] regarding clear liquid status day before surgery ? Asked for call back .  Otherwise will be no solid foods after midnite nite before surgeyr.  Covid test- 10/03/2021.   Hx of right foot fracture surgery uses right foot stimulator 3 hours every day pt states will not use at this surgery she has already spoken with MD about this.  Urine culture done 09/26/2021 routed to DR [facility].

## 2021-09-25 NOTE — Progress Notes (Signed)
DUE TO COVID-19 ONLY ONE VISITOR IS ALLOWED TO COME WITH YOU AND STAY IN THE WAITING ROOM ONLY DURING PRE OP AND PROCEDURE DAY OF SURGERY. THE 1 VISITOR  MAY VISIT WITH YOU AFTER SURGERY IN YOUR PRIVATE ROOM DURING VISITING HOURS ONLY!  YOU NEED TO HAVE A COVID 19 TEST ON___12/13/2022 ____ @_______ , THIS TEST MUST BE DONE BEFORE SURGERY,  COVID TESTING SITE IS AT [place]. PLEASE REMAIN IN YOUR CAR THIS IS A DRIVER UP TEST. AFTER YOUR COVID TEST PLEASE WEAR A MASK OUT IN PUBLIC AND SOCIAL DISTANCE AND [place] YOUR HANDS FREQUENTLY. PLEASE ASK ALL YOUR CLOSE CONTACTS TO WEAR A MASK OUT IN PUBLIC AND SOCIAL DISTANCE AND [place] HANDS FREQUENTLY ALSO.               Sarah Cooley  09/25/2021   Your procedure is scheduled on:          10/06/2021   Report to [facility] Main  Entrance   Report to admitting at  [place] AM     Call this number if you have problems the morning of surgery [phone]    Remember: Do not eat food , candy gum or mints :After Midnight. You may have clear liquids from midnight until __  0430am    CLEAR LIQUID DIET   Foods Allowed                                                                       Coffee and tea, regular and decaf                              Plain Jell-O any favor except red or purple                                            Fruit ices (not with fruit pulp)                                      Iced Popsicles                                     Carbonated beverages, regular and diet                                    Cranberry, grape and apple juices Sports drinks like Gatorade Lightly seasoned clear broth or consume(fat free) Sugar   _____________________________________________________________________    BRUSH YOUR TEETH MORNING OF SURGERY AND RINSE YOUR MOUTH OUT, NO CHEWING GUM CANDY OR MINTS.     Take these medicines the morning of surgery with A SIP OF WATER:   protonix   DO NOT TAKE ANY DIABETIC  MEDICATIONS DAY OF YOUR SURGERY  You may not have any metal on your body including hair pins and              piercings  Do not wear jewelry, make-up, lotions, powders or perfumes, deodorant             Do not wear nail polish on your fingernails.  Do not shave  48 hours prior to surgery.              Men may shave face and neck.   Do not bring valuables to the hospital. [place].  Contacts, dentures or bridgework may not be worn into surgery.  Leave suitcase in the car. After surgery it may be brought to your room.     Patients discharged the day of surgery will not be allowed to drive home. IF YOU ARE HAVING SURGERY AND GOING HOME THE SAME DAY, YOU MUST HAVE AN ADULT TO DRIVE YOU HOME AND BE WITH YOU FOR 24 HOURS. YOU MAY GO HOME BY TAXI OR UBER OR ORTHERWISE, BUT AN ADULT MUST ACCOMPANY YOU HOME AND STAY WITH YOU FOR 24 HOURS.  Name and phone number of your driver:  Special Instructions: N/A              Please read over the following fact sheets you were given: _____________________________________________________________________  [facility] - Preparing for Surgery Before surgery, you can play an important role.  Because skin is not sterile, your skin needs to be as free of germs as possible.  You can reduce the number of germs on your skin by washing with CHG (chlorahexidine gluconate) soap before surgery.  CHG is an antiseptic cleaner which kills germs and bonds with the skin to continue killing germs even after washing. Please DO NOT use if you have an allergy to CHG or antibacterial soaps.  If your skin becomes reddened/irritated stop using the CHG and inform your nurse when you arrive at Short Stay. Do not shave (including legs and underarms) for at least 48 hours prior to the first CHG shower.  You may shave your face/neck. Please follow these instructions carefully:  1.  Shower with CHG Soap the night  before surgery and the  morning of Surgery.  2.  If you choose to wash your hair, wash your hair first as usual with your  normal  shampoo.  3.  After you shampoo, rinse your hair and body thoroughly to remove the  shampoo.                           4.  Use CHG as you would any other liquid soap.  You can apply chg directly  to the skin and wash                       Gently with a scrungie or clean washcloth.  5.  Apply the CHG Soap to your body ONLY FROM THE NECK DOWN.   Do not use on face/ open                           Wound or open sores. Avoid contact with eyes, ears mouth and genitals (private parts).  Wash face,  Genitals (private parts) with your normal soap.             6.  Wash thoroughly, paying special attention to the area where your surgery  will be performed.  7.  Thoroughly rinse your body with warm water from the neck down.  8.  DO NOT shower/wash with your normal soap after using and rinsing off  the CHG Soap.                9.  Pat yourself dry with a clean towel.            10.  Wear clean pajamas.            11.  Place clean sheets on your bed the night of your first shower and do not  sleep with pets. Day of Surgery : Do not apply any lotions/deodorants the morning of surgery.  Please wear clean clothes to the hospital/surgery center.  FAILURE TO FOLLOW THESE INSTRUCTIONS MAY RESULT IN THE CANCELLATION OF YOUR SURGERY PATIENT SIGNATURE_________________________________  NURSE SIGNATURE__________________________________  ________________________________________________________________________

## 2021-09-26 ENCOUNTER — Encounter (HOSPITAL_COMMUNITY)
Admission: RE | Admit: 2021-09-26 | Discharge: 2021-09-26 | Disposition: A | Discharge status: Home / Self Care | Payer: Medicare HMO | Source: Ambulatory Visit | Attending: Urology | Admitting: Urology

## 2021-09-26 ENCOUNTER — Encounter (HOSPITAL_COMMUNITY): Payer: Self-pay

## 2021-09-26 ENCOUNTER — Other Ambulatory Visit: Payer: Self-pay

## 2021-09-26 DIAGNOSIS — Z01818 Encounter for other preprocedural examination: Secondary | ICD-10-CM | POA: Insufficient documentation

## 2021-09-26 HISTORY — DX: Tremor, unspecified: R25.1

## 2021-09-26 HISTORY — DX: Polyneuropathy, unspecified: G62.9

## 2021-09-26 HISTORY — DX: Dyspnea, unspecified: R06.00

## 2021-09-26 LAB — COMPREHENSIVE METABOLIC PANEL
ALT: 34 U/L (ref 0–44)
AST: 27 U/L (ref 15–41)
Albumin: 4.2 g/dL (ref 3.5–5.0)
Alkaline Phosphatase: 71 U/L (ref 38–126)
Anion gap: 6 (ref 5–15)
BUN: 16 mg/dL (ref 6–20)
CO2: 24 mmol/L (ref 22–32)
Calcium: 9.3 mg/dL (ref 8.9–10.3)
Chloride: 109 mmol/L (ref 98–111)
Creatinine, Ser: 0.76 mg/dL (ref 0.44–1.00)
GFR, Estimated: >60 mL/min (ref >60)
Glucose, Bld: 92 mg/dL (ref 70–99)
Potassium: 3.4 mmol/L — ABNORMAL LOW (ref 3.5–5.1)
Sodium: 139 mmol/L (ref 135–145)
Total Bilirubin: 0.3 mg/dL (ref 0.3–1.2)
Total Protein: 7.8 g/dL (ref 6.5–8.1)

## 2021-09-26 LAB — CBC
HCT: 43.8 % (ref 36.0–46.0)
Hemoglobin: 14.8 g/dL (ref 12.0–15.0)
MCH: 31.9 pg (ref 26.0–34.0)
MCHC: 33.8 g/dL (ref 30.0–36.0)
MCV: 94.4 fL (ref 80.0–100.0)
Platelets: 199 10*3/uL (ref 150–400)
RBC: 4.64 MIL/uL (ref 3.87–5.11)
RDW: 12.5 % (ref 11.5–15.5)
WBC: 6.7 10*3/uL (ref 4.0–10.5)
nRBC: 0 % (ref 0.0–0.2)

## 2021-09-27 LAB — URINE CULTURE: Culture: <10000 — AB

## 2021-10-03 ENCOUNTER — Other Ambulatory Visit: Payer: Self-pay | Admitting: Urology

## 2021-10-03 ENCOUNTER — Encounter (HOSPITAL_COMMUNITY): Payer: Self-pay | Admitting: Hematology

## 2021-10-04 LAB — SARS CORONAVIRUS 2 (TAT 6-24 HRS): SARS Coronavirus 2: NEGATIVE

## 2021-10-05 NOTE — Anesthesia Preprocedure Evaluation (Addendum)
Anesthesia Evaluation  Patient identified by MRN, date of birth, ID band Patient awake    Reviewed: Allergy & Precautions, NPO status , Patient's Chart, lab work & pertinent test results  History of Anesthesia Complications (+) PONV and history of anesthetic complications  Airway Mallampati: II  TM Distance: >3 FB Neck ROM: Full    Dental no notable dental hx.    Pulmonary sleep apnea ,    Pulmonary exam normal breath sounds clear to auscultation       Cardiovascular hypertension, Pt. on medications Normal cardiovascular exam Rhythm:Regular Rate:Normal  Normal sinus rhythm Minimal voltage criteria for LVH, may be normal variant ( R in aVL ) Nonspecific T wave abnormality Abnormal ECG Confirmed by Ida Rogue [phone]) on 09/26/2021 1:02:18 PM   Neuro/Psych  Headaches, PSYCHIATRIC DISORDERS Anxiety Depression H/o brain aneurysm Tremors Diplopia  TIA   GI/Hepatic hiatal hernia, GERD  ,  Endo/Other    Renal/GU Renal InsufficiencyRenal disease (renal cell cancer)     Musculoskeletal  (+) Arthritis , Osteoarthritis,  Fibromyalgia -  Abdominal   Peds  Hematology   Anesthesia Other Findings H/o breast ca  Reproductive/Obstetrics negative OB ROS                          Anesthesia Physical Anesthesia Plan  ASA: 3  Anesthesia Plan: General   Post-op Pain Management:    Induction: Intravenous  PONV Risk Score and Plan: 3 and Treatment may vary due to age or medical condition, Midazolam, Scopolamine patch - Pre-op, Ondansetron and Dexamethasone  Airway Management Planned: Oral ETT  Additional Equipment:   Intra-op Plan:   Post-operative Plan: Extubation in OR  Informed Consent: I have reviewed the patients History and Physical, chart, labs and discussed the procedure including the risks, benefits and alternatives for the proposed anesthesia with the patient or authorized  representative who has indicated his/her understanding and acceptance.     Dental advisory given  Plan Discussed with: Anesthesiologist, Surgeon and CRNA  Anesthesia Plan Comments:        Anesthesia Quick Evaluation

## 2021-10-06 ENCOUNTER — Observation Stay (HOSPITAL_COMMUNITY)
Admission: RE | Admit: 2021-10-06 | Discharge: 2021-10-07 | Disposition: A | Discharge status: Home / Self Care | Payer: Medicare HMO | Attending: Urology | Admitting: Urology

## 2021-10-06 ENCOUNTER — Ambulatory Visit (HOSPITAL_COMMUNITY): Payer: Medicare HMO | Admitting: Anesthesiology

## 2021-10-06 ENCOUNTER — Other Ambulatory Visit: Payer: Self-pay

## 2021-10-06 ENCOUNTER — Ambulatory Visit (HOSPITAL_COMMUNITY): Payer: Medicare HMO | Admitting: Physician Assistant

## 2021-10-06 ENCOUNTER — Encounter (HOSPITAL_COMMUNITY): Payer: Self-pay | Admitting: Urology

## 2021-10-06 ENCOUNTER — Encounter (HOSPITAL_COMMUNITY)
Admission: RE | Disposition: A | Discharge status: Home / Self Care | Payer: Self-pay | Source: Home / Self Care | Attending: Urology

## 2021-10-06 DIAGNOSIS — Z79899 Other long term (current) drug therapy: Secondary | ICD-10-CM | POA: Diagnosis not present

## 2021-10-06 DIAGNOSIS — I10 Essential (primary) hypertension: Secondary | ICD-10-CM | POA: Diagnosis not present

## 2021-10-06 DIAGNOSIS — C641 Malignant neoplasm of right kidney, except renal pelvis: Secondary | ICD-10-CM | POA: Diagnosis not present

## 2021-10-06 DIAGNOSIS — N2889 Other specified disorders of kidney and ureter: Secondary | ICD-10-CM | POA: Diagnosis present

## 2021-10-06 HISTORY — PX: ROBOTIC ASSITED PARTIAL NEPHRECTOMY: SHX6087

## 2021-10-06 HISTORY — PX: CYSTOSCOPY W/ RETROGRADES: SHX1426

## 2021-10-06 LAB — TYPE AND SCREEN
ABO/RH(D): A POS
Antibody Screen: NEGATIVE

## 2021-10-06 LAB — BASIC METABOLIC PANEL
Anion gap: 7 (ref 5–15)
BUN: 16 mg/dL (ref 6–20)
CO2: 24 mmol/L (ref 22–32)
Calcium: 8.8 mg/dL — ABNORMAL LOW (ref 8.9–10.3)
Chloride: 108 mmol/L (ref 98–111)
Creatinine, Ser: 1.16 mg/dL — ABNORMAL HIGH (ref 0.44–1.00)
GFR, Estimated: 56 mL/min — ABNORMAL LOW (ref >60)
Glucose, Bld: 157 mg/dL — ABNORMAL HIGH (ref 70–99)
Potassium: 4 mmol/L (ref 3.5–5.1)
Sodium: 139 mmol/L (ref 135–145)

## 2021-10-06 LAB — CBC
HCT: 44.2 % (ref 36.0–46.0)
Hemoglobin: 14.5 g/dL (ref 12.0–15.0)
MCH: 31.7 pg (ref 26.0–34.0)
MCHC: 32.8 g/dL (ref 30.0–36.0)
MCV: 96.5 fL (ref 80.0–100.0)
Platelets: 187 10*3/uL (ref 150–400)
RBC: 4.58 MIL/uL (ref 3.87–5.11)
RDW: 12.6 % (ref 11.5–15.5)
WBC: 11.9 10*3/uL — ABNORMAL HIGH (ref 4.0–10.5)
nRBC: 0 % (ref 0.0–0.2)

## 2021-10-06 SURGERY — NEPHRECTOMY, PARTIAL, ROBOT-ASSISTED
Anesthesia: General | Site: Renal | Laterality: Right

## 2021-10-06 MED ORDER — BUPIVACAINE LIPOSOME 1.3 % IJ SUSP
INTRAMUSCULAR | Status: AC
  Filled 2021-10-06: qty 20

## 2021-10-06 MED ORDER — BUPIVACAINE-EPINEPHRINE 0.5% -1:200000 IJ SOLN
INTRAMUSCULAR | Status: DC | PRN
  Administered 2021-10-06 (×2): 15 mL

## 2021-10-06 MED ORDER — PROPOFOL 10 MG/ML IV BOLUS
INTRAVENOUS | Status: AC
  Filled 2021-10-06: qty 20

## 2021-10-06 MED ORDER — ONDANSETRON HCL 4 MG/2ML IJ SOLN
INTRAMUSCULAR | Status: AC
  Filled 2021-10-06: qty 2

## 2021-10-06 MED ORDER — MIDAZOLAM HCL 2 MG/2ML IJ SOLN
INTRAMUSCULAR | Status: DC | PRN
Start: 2021-10-06 — End: 2021-10-06
  Administered 2021-10-06: 2 mg via INTRAVENOUS

## 2021-10-06 MED ORDER — MIDAZOLAM HCL 2 MG/2ML IJ SOLN
INTRAMUSCULAR | Status: AC
  Filled 2021-10-06: qty 2

## 2021-10-06 MED ORDER — SODIUM CHLORIDE 0.9 % IV SOLN
2.0000 g | INTRAVENOUS | Status: AC
  Administered 2021-10-06: 2 g via INTRAVENOUS
  Filled 2021-10-06: qty 20

## 2021-10-06 MED ORDER — ONDANSETRON HCL 4 MG/2ML IJ SOLN
INTRAMUSCULAR | Status: DC | PRN
  Administered 2021-10-06: 4 mg via INTRAVENOUS

## 2021-10-06 MED ORDER — SUGAMMADEX SODIUM 500 MG/5ML IV SOLN
INTRAVENOUS | Status: AC
  Filled 2021-10-06: qty 5

## 2021-10-06 MED ORDER — ACETAMINOPHEN 500 MG PO TABS
1000.0000 mg | ORAL_TABLET | Freq: Once | ORAL | Status: AC
  Administered 2021-10-06: 1000 mg via ORAL
  Filled 2021-10-06: qty 2

## 2021-10-06 MED ORDER — PROPRANOLOL HCL ER 80 MG PO CP24
80.0000 mg | ORAL_CAPSULE | Freq: Every day | ORAL | Status: DC
  Administered 2021-10-06: 80 mg via ORAL
  Filled 2021-10-06 (×2): qty 1

## 2021-10-06 MED ORDER — HYDRALAZINE HCL 20 MG/ML IJ SOLN: 5.0000 mg | INTRAMUSCULAR | Status: DC | PRN

## 2021-10-06 MED ORDER — LACTATED RINGERS IR SOLN
Status: DC | PRN
  Administered 2021-10-06: 1

## 2021-10-06 MED ORDER — ORAL CARE MOUTH RINSE: 15.0000 mL | Freq: Once | OROMUCOSAL | Status: AC

## 2021-10-06 MED ORDER — ROCURONIUM BROMIDE 10 MG/ML (PF) SYRINGE
PREFILLED_SYRINGE | INTRAVENOUS | Status: AC
  Filled 2021-10-06: qty 10

## 2021-10-06 MED ORDER — BUPIVACAINE HCL (PF) 0.5 % IJ SOLN
INTRAMUSCULAR | Status: AC
  Filled 2021-10-06: qty 30

## 2021-10-06 MED ORDER — PRIMIDONE 50 MG PO TABS
50.0000 mg | ORAL_TABLET | Freq: Every day | ORAL | Status: DC
  Administered 2021-10-06: 50 mg via ORAL
  Filled 2021-10-06 (×2): qty 1

## 2021-10-06 MED ORDER — OXYCODONE HCL 5 MG PO TABS: 5.0000 mg | ORAL_TABLET | ORAL | 0 refills | Status: AC | PRN

## 2021-10-06 MED ORDER — DEXAMETHASONE SODIUM PHOSPHATE 10 MG/ML IJ SOLN
INTRAMUSCULAR | Status: DC | PRN
  Administered 2021-10-06: 8 mg via INTRAVENOUS

## 2021-10-06 MED ORDER — TOPIRAMATE 25 MG PO TABS
50.0000 mg | ORAL_TABLET | Freq: Two times a day (BID) | ORAL | Status: DC
  Administered 2021-10-06 – 2021-10-07 (×2): 50 mg via ORAL
  Filled 2021-10-06 (×2): qty 2

## 2021-10-06 MED ORDER — ONDANSETRON HCL 4 MG/2ML IJ SOLN: 4.0000 mg | Freq: Four times a day (QID) | INTRAMUSCULAR | Status: DC | PRN

## 2021-10-06 MED ORDER — PROPOFOL 10 MG/ML IV BOLUS
INTRAVENOUS | Status: DC | PRN
  Administered 2021-10-06: 150 mg via INTRAVENOUS

## 2021-10-06 MED ORDER — LIDOCAINE 2% (20 MG/ML) 5 ML SYRINGE
INTRAMUSCULAR | Status: DC | PRN
  Administered 2021-10-06: 80 mg via INTRAVENOUS

## 2021-10-06 MED ORDER — PRAMIPEXOLE DIHYDROCHLORIDE 0.25 MG PO TABS
0.2500 mg | ORAL_TABLET | Freq: Every day | ORAL | Status: DC
  Administered 2021-10-06: 0.25 mg via ORAL
  Filled 2021-10-06 (×2): qty 1

## 2021-10-06 MED ORDER — HEMOSTATIC AGENTS (NO CHARGE) OPTIME
TOPICAL | Status: DC | PRN
  Administered 2021-10-06: 1 via TOPICAL

## 2021-10-06 MED ORDER — PHENYLEPHRINE 40 MCG/ML (10ML) SYRINGE FOR IV PUSH (FOR BLOOD PRESSURE SUPPORT)
PREFILLED_SYRINGE | INTRAVENOUS | Status: AC
  Filled 2021-10-06: qty 10

## 2021-10-06 MED ORDER — PANTOPRAZOLE SODIUM 40 MG IV SOLR
40.0000 mg | INTRAVENOUS | Status: DC
  Administered 2021-10-06: 40 mg via INTRAVENOUS
  Filled 2021-10-06: qty 40

## 2021-10-06 MED ORDER — ROCURONIUM BROMIDE 10 MG/ML (PF) SYRINGE
PREFILLED_SYRINGE | INTRAVENOUS | Status: DC | PRN
  Administered 2021-10-06: 20 mg via INTRAVENOUS
  Administered 2021-10-06: 80 mg via INTRAVENOUS
  Administered 2021-10-06: 10 mg via INTRAVENOUS
  Administered 2021-10-06: 20 mg via INTRAVENOUS

## 2021-10-06 MED ORDER — BUPIVACAINE LIPOSOME 1.3 % IJ SUSP
INTRAMUSCULAR | Status: DC | PRN
  Administered 2021-10-06: 20 mL

## 2021-10-06 MED ORDER — SUGAMMADEX SODIUM 200 MG/2ML IV SOLN
INTRAVENOUS | Status: DC | PRN
  Administered 2021-10-06: 200 mg via INTRAVENOUS

## 2021-10-06 MED ORDER — LACTATED RINGERS IV SOLN: INTRAVENOUS | Status: DC

## 2021-10-06 MED ORDER — FENTANYL CITRATE PF 50 MCG/ML IJ SOSY: 25.0000 ug | PREFILLED_SYRINGE | INTRAMUSCULAR | Status: DC | PRN

## 2021-10-06 MED ORDER — DIAZEPAM 2 MG PO TABS
10.0000 mg | ORAL_TABLET | Freq: Every day | ORAL | Status: DC
  Administered 2021-10-06: 10 mg via ORAL
  Filled 2021-10-06: qty 5

## 2021-10-06 MED ORDER — CHLORHEXIDINE GLUCONATE 0.12 % MT SOLN
15.0000 mL | Freq: Once | OROMUCOSAL | Status: AC
  Administered 2021-10-06: 15 mL via OROMUCOSAL

## 2021-10-06 MED ORDER — FENTANYL CITRATE (PF) 100 MCG/2ML IJ SOLN
INTRAMUSCULAR | Status: AC
  Filled 2021-10-06: qty 2

## 2021-10-06 MED ORDER — OXYCODONE HCL 5 MG PO TABS
5.0000 mg | ORAL_TABLET | ORAL | Status: DC | PRN
Start: 2021-10-06 — End: 2021-10-07
  Administered 2021-10-06 – 2021-10-07 (×3): 10 mg via ORAL
  Filled 2021-10-06 (×3): qty 2

## 2021-10-06 MED ORDER — DOCUSATE SODIUM 100 MG PO CAPS
100.0000 mg | ORAL_CAPSULE | Freq: Two times a day (BID) | ORAL | Status: DC
  Administered 2021-10-07: 100 mg via ORAL
  Filled 2021-10-06 (×2): qty 1

## 2021-10-06 MED ORDER — PHENYLEPHRINE 40 MCG/ML (10ML) SYRINGE FOR IV PUSH (FOR BLOOD PRESSURE SUPPORT)
PREFILLED_SYRINGE | INTRAVENOUS | Status: DC | PRN
  Administered 2021-10-06: 80 ug via INTRAVENOUS

## 2021-10-06 MED ORDER — DEXAMETHASONE SODIUM PHOSPHATE 10 MG/ML IJ SOLN
INTRAMUSCULAR | Status: AC
  Filled 2021-10-06: qty 1

## 2021-10-06 MED ORDER — ONDANSETRON HCL 4 MG/2ML IJ SOLN: 4.0000 mg | Freq: Once | INTRAMUSCULAR | Status: DC | PRN

## 2021-10-06 MED ORDER — EPHEDRINE 5 MG/ML INJ
INTRAVENOUS | Status: AC
  Filled 2021-10-06: qty 5

## 2021-10-06 MED ORDER — EPHEDRINE SULFATE-NACL 50-0.9 MG/10ML-% IV SOSY
PREFILLED_SYRINGE | INTRAVENOUS | Status: DC | PRN
  Administered 2021-10-06: 5 mg via INTRAVENOUS

## 2021-10-06 MED ORDER — SODIUM CHLORIDE 0.45 % IV SOLN: INTRAVENOUS | Status: DC

## 2021-10-06 MED ORDER — OXYCODONE HCL 5 MG/5ML PO SOLN: 5.0000 mg | Freq: Once | ORAL | Status: DC | PRN

## 2021-10-06 MED ORDER — SCOPOLAMINE 1 MG/3DAYS TD PT72
1.0000 | MEDICATED_PATCH | TRANSDERMAL | Status: DC
  Administered 2021-10-06: 1.5 mg via TRANSDERMAL
  Filled 2021-10-06: qty 1

## 2021-10-06 MED ORDER — HYDROMORPHONE HCL 1 MG/ML IJ SOLN
0.5000 mg | INTRAMUSCULAR | Status: DC | PRN
  Administered 2021-10-06: 0.5 mg via INTRAVENOUS
  Filled 2021-10-06: qty 0.5

## 2021-10-06 MED ORDER — STERILE WATER FOR IRRIGATION IR SOLN
Status: DC | PRN
  Administered 2021-10-06: 3000 mL via INTRAVESICAL
  Administered 2021-10-06: 1000 mL via INTRAVESICAL

## 2021-10-06 MED ORDER — FENTANYL CITRATE (PF) 100 MCG/2ML IJ SOLN
INTRAMUSCULAR | Status: DC | PRN
  Administered 2021-10-06 (×3): 50 ug via INTRAVENOUS

## 2021-10-06 MED ORDER — AMISULPRIDE (ANTIEMETIC) 5 MG/2ML IV SOLN: 10.0000 mg | Freq: Once | INTRAVENOUS | Status: DC | PRN

## 2021-10-06 MED ORDER — VENLAFAXINE HCL ER 37.5 MG PO CP24
37.5000 mg | ORAL_CAPSULE | Freq: Every day | ORAL | Status: DC
  Administered 2021-10-06: 37.5 mg via ORAL
  Filled 2021-10-06: qty 1

## 2021-10-06 MED ORDER — OXYCODONE HCL 5 MG PO TABS: 5.0000 mg | ORAL_TABLET | Freq: Once | ORAL | Status: DC | PRN

## 2021-10-06 MED ORDER — ACETAMINOPHEN 10 MG/ML IV SOLN
1000.0000 mg | Freq: Four times a day (QID) | INTRAVENOUS | Status: AC
  Administered 2021-10-06 – 2021-10-07 (×4): 1000 mg via INTRAVENOUS
  Filled 2021-10-06 (×4): qty 100

## 2021-10-06 MED ORDER — LIDOCAINE HCL (PF) 2 % IJ SOLN
INTRAMUSCULAR | Status: AC
  Filled 2021-10-06: qty 5

## 2021-10-06 MED ORDER — BUPIVACAINE-EPINEPHRINE (PF) 0.5% -1:200000 IJ SOLN
INTRAMUSCULAR | Status: AC
  Filled 2021-10-06: qty 30

## 2021-10-06 MED ORDER — FENTANYL CITRATE PF 50 MCG/ML IJ SOSY
PREFILLED_SYRINGE | INTRAMUSCULAR | Status: AC
  Filled 2021-10-06: qty 2

## 2021-10-06 SURGICAL SUPPLY — 97 items
ADH SKN CLS APL DERMABOND .7 (GAUZE/BANDAGES/DRESSINGS)
AGENT HMST KT MTR STRL THRMB (HEMOSTASIS) ×2
APL ESCP 34 STRL LF DISP (HEMOSTASIS) ×2
APL PRP STRL LF DISP 70% ISPRP (MISCELLANEOUS) ×2
APL SRG 38 LTWT LNG FL B (MISCELLANEOUS)
APPLICATOR ARISTA FLEXITIP XL (MISCELLANEOUS) IMPLANT
APPLICATOR SURGIFLO ENDO (HEMOSTASIS) ×3 IMPLANT
BAG COUNTER SPONGE SURGICOUNT (BAG) IMPLANT
BAG SPEC RTRVL LRG 6X4 10 (ENDOMECHANICALS) ×2
BAG SPNG CNTER NS LX DISP (BAG)
BAG URO CATCHER STRL LF (MISCELLANEOUS) ×2 IMPLANT
CATH URETL OPEN 5X70 (CATHETERS) ×2 IMPLANT
CHLORAPREP W/TINT 26 (MISCELLANEOUS) ×3 IMPLANT
CLIP LIGATING HEM O LOK PURPLE (MISCELLANEOUS) ×3 IMPLANT
CLIP LIGATING HEMO LOK XL GOLD (MISCELLANEOUS) IMPLANT
CLIP LIGATING HEMO O LOK GREEN (MISCELLANEOUS) ×6 IMPLANT
CLOTH BEACON ORANGE TIMEOUT ST (SAFETY) ×3 IMPLANT
COVER SURGICAL LIGHT HANDLE (MISCELLANEOUS) ×3 IMPLANT
COVER TIP SHEARS 8 DVNC (MISCELLANEOUS) ×2 IMPLANT
COVER TIP SHEARS 8MM DA VINCI (MISCELLANEOUS) ×3
CUTTER ECHEON FLEX ENDO 45 340 (ENDOMECHANICALS) IMPLANT
DECANTER SPIKE VIAL GLASS SM (MISCELLANEOUS) ×3 IMPLANT
DERMABOND ADVANCED (GAUZE/BANDAGES/DRESSINGS)
DERMABOND ADVANCED .7 DNX12 (GAUZE/BANDAGES/DRESSINGS) ×2 IMPLANT
DRAIN CHANNEL 15F RND FF 3/16 (WOUND CARE) ×3 IMPLANT
DRAPE ARM DVNC X/XI (DISPOSABLE) ×8 IMPLANT
DRAPE COLUMN DVNC XI (DISPOSABLE) ×2 IMPLANT
DRAPE DA VINCI XI ARM (DISPOSABLE) ×12
DRAPE DA VINCI XI COLUMN (DISPOSABLE) ×3
DRAPE INCISE IOBAN 66X45 STRL (DRAPES) ×3 IMPLANT
DRAPE SHEET LG 3/4 BI-LAMINATE (DRAPES) ×3 IMPLANT
DRSG COVADERM PLUS 2X2 (GAUZE/BANDAGES/DRESSINGS) ×1 IMPLANT
DRSG IV TEGADERM 3.5X4.5 STRL (GAUZE/BANDAGES/DRESSINGS) ×5 IMPLANT
DRSG TEGADERM 4X4.75 (GAUZE/BANDAGES/DRESSINGS) ×1 IMPLANT
ELECT PENCIL ROCKER SW 15FT (MISCELLANEOUS) ×3 IMPLANT
ELECT REM PT RETURN 15FT ADLT (MISCELLANEOUS) ×3 IMPLANT
EVACUATOR SILICONE 100CC (DRAIN) ×3 IMPLANT
GAUZE 4X4 16PLY ~~LOC~~+RFID DBL (SPONGE) ×1 IMPLANT
GLOVE SURG ENC MOIS LTX SZ6.5 (GLOVE) ×3 IMPLANT
GLOVE SURG ENC MOIS LTX SZ7.5 (GLOVE) ×3 IMPLANT
GLOVE SURG ENC TEXT LTX SZ7.5 (GLOVE) ×6 IMPLANT
GOWN STRL REUS W/TWL LRG LVL3 (GOWN DISPOSABLE) ×4 IMPLANT
GOWN STRL REUS W/TWL XL LVL3 (GOWN DISPOSABLE) ×6 IMPLANT
GUIDEWIRE STR DUAL SENSOR (WIRE) ×2 IMPLANT
HEMOSTAT ARISTA ABSORB 3G PWDR (HEMOSTASIS) IMPLANT
HOLDER FOLEY CATH W/STRAP (MISCELLANEOUS) ×1 IMPLANT
IRRIG SUCT STRYKERFLOW 2 WTIP (MISCELLANEOUS) ×3
IRRIGATION SUCT STRKRFLW 2 WTP (MISCELLANEOUS) ×2 IMPLANT
IV LACTATED RINGERS 1000ML (IV SOLUTION) ×1 IMPLANT
KIT BASIN OR (CUSTOM PROCEDURE TRAY) ×3 IMPLANT
KIT TURNOVER KIT A (KITS) IMPLANT
LASER FIB FLEXIVA PULSE ID 365 (Laser) IMPLANT
LASER FIB FLEXIVA PULSE ID 550 (Laser) IMPLANT
LASER FIB FLEXIVA PULSE ID 910 (Laser) IMPLANT
LOOP VESSEL MAXI BLUE (MISCELLANEOUS) ×3 IMPLANT
MANIFOLD NEPTUNE II (INSTRUMENTS) ×3 IMPLANT
MARKER SKIN DUAL TIP RULER LAB (MISCELLANEOUS) ×3 IMPLANT
NDL INSUFFLATION 14GA 120MM (NEEDLE) IMPLANT
NEEDLE INSUFFLATION 14GA 120MM (NEEDLE) IMPLANT
NS IRRIG 1000ML POUR BTL (IV SOLUTION) ×2 IMPLANT
PACK CYSTO (CUSTOM PROCEDURE TRAY) ×2 IMPLANT
PAD POSITIONING PINK XL (MISCELLANEOUS) ×3 IMPLANT
PENCIL SMOKE EVACUATOR (MISCELLANEOUS) IMPLANT
POUCH SPECIMEN RETRIEVAL 10MM (ENDOMECHANICALS) ×3 IMPLANT
PROTECTOR NERVE ULNAR (MISCELLANEOUS) ×5 IMPLANT
RELOAD STAPLE 45 2.6 WHT THIN (STAPLE) IMPLANT
SCISSORS LAP 5X35 DISP (ENDOMECHANICALS) ×1 IMPLANT
SEAL CANN UNIV 5-8 DVNC XI (MISCELLANEOUS) ×8 IMPLANT
SEAL XI 5MM-8MM UNIVERSAL (MISCELLANEOUS) ×12
SET TUBE SMOKE EVAC HIGH FLOW (TUBING) ×3 IMPLANT
SOLUTION ELECTROLUBE (MISCELLANEOUS) ×3 IMPLANT
STAPLE RELOAD 45 WHT (STAPLE) IMPLANT
STAPLE RELOAD 45MM WHITE (STAPLE)
SURGIFLO W/THROMBIN 8M KIT (HEMOSTASIS) ×3 IMPLANT
SUT ETHILON 3 0 PS 1 (SUTURE) ×3 IMPLANT
SUT MNCRL AB 4-0 PS2 18 (SUTURE) ×6 IMPLANT
SUT V-LOC BARB 180 2/0GR6 GS22 (SUTURE) ×6
SUT VIC AB 0 CT1 27 (SUTURE) ×3
SUT VIC AB 0 CT1 27XBRD ANTBC (SUTURE) ×2 IMPLANT
SUT VIC AB 0 CT1 36 (SUTURE) ×1 IMPLANT
SUT VIC AB 0 CT2 27 (SUTURE) ×1 IMPLANT
SUT VICRYL 0 UR6 27IN ABS (SUTURE) IMPLANT
SUT VLOC BARB 180 ABS3/0GR12 (SUTURE) ×6
SUTURE V-LC BRB 180 2/0GR6GS22 (SUTURE) ×2 IMPLANT
SUTURE VLOC BRB 180 ABS3/0GR12 (SUTURE) ×2 IMPLANT
TOWEL OR 17X26 10 PK STRL BLUE (TOWEL DISPOSABLE) ×3 IMPLANT
TOWEL OR NON WOVEN STRL DISP B (DISPOSABLE) ×3 IMPLANT
TRACTIP FLEXIVA PULS ID 200XHI (Laser) IMPLANT
TRACTIP FLEXIVA PULSE ID 200 (Laser)
TRAY FOLEY MTR SLVR 16FR STAT (SET/KITS/TRAYS/PACK) ×3 IMPLANT
TRAY LAPAROSCOPIC (CUSTOM PROCEDURE TRAY) ×3 IMPLANT
TROCAR BLADELESS OPT 5 100 (ENDOMECHANICALS) ×1 IMPLANT
TROCAR ENDOPATH XCEL 12X100 BL (ENDOMECHANICALS) IMPLANT
TROCAR XCEL 12X100 BLDLESS (ENDOMECHANICALS) ×3 IMPLANT
TUBING CONNECTING 10 (TUBING) ×3 IMPLANT
WATER STERILE IRR 1000ML POUR (IV SOLUTION) ×3 IMPLANT
WATER STERILE IRR 3000ML UROMA (IV SOLUTION) ×1 IMPLANT

## 2021-10-06 NOTE — Anesthesia Procedure Notes (Addendum)
Procedure Name: Intubation Date/Time: 10/06/2021 7:35 AM Performed by: Niel Hummer, CRNA Pre-anesthesia Checklist: Patient identified, Emergency Drugs available, Suction available and Patient being monitored Patient Re-evaluated:Patient Re-evaluated prior to induction Oxygen Delivery Method: Circle system utilized Preoxygenation: Pre-oxygenation with 100% oxygen Induction Type: IV induction Ventilation: Mask ventilation without difficulty Laryngoscope Size: Mac and 4 Grade View: Grade I Tube type: Oral Tube size: 7.0 mm Number of attempts: 1 Airway Equipment and Method: Stylet Placement Confirmation: ETT inserted through vocal cords under direct vision, positive ETCO2 and breath sounds checked- equal and bilateral Secured at: 24 cm Tube secured with: Tape Dental Injury: Teeth and Oropharynx as per pre-operative assessment

## 2021-10-06 NOTE — Anesthesia Postprocedure Evaluation (Signed)
Anesthesia Post Note  Patient: Sander Radon  Procedure(s) Performed: XI ROBOTIC ASSITED RIGHT PARTIAL NEPHRECTOMY/ LYSIS OF ADHESIONS MORE THAN 2 MINUTES (Right: Renal) FLEXIBLE CYSTOSCOPY (Bilateral: Bladder)     Patient location during evaluation: PACU Anesthesia Type: General Level of consciousness: awake Pain management: pain level controlled Vital Signs Assessment: post-procedure vital signs reviewed and stable Respiratory status: spontaneous breathing and respiratory function stable Cardiovascular status: stable Postop Assessment: no apparent nausea or vomiting Anesthetic complications: no   No notable events documented.  Last Vitals:  Vitals:   10/06/21 1335 10/06/21 1400  BP: (!) 140/92 (!) 139/91  Pulse: 75 79  Resp: 15 15  Temp: 36.6 C   SpO2: 98% 98%    Last Pain:  Vitals:   10/06/21 1335  TempSrc:   PainSc: Asleep   Pain Goal:                   Merlinda Frederick

## 2021-10-06 NOTE — Transfer of Care (Signed)
Immediate Anesthesia Transfer of Care Note  Patient: Sarah Cooley  Procedure(s) Performed: XI ROBOTIC ASSITED RIGHT PARTIAL NEPHRECTOMY/ LYSIS OF ADHESIONS MORE THAN 45 MINUTES (Right: Renal) FLEXIBLE CYSTOSCOPY (Bilateral: Bladder)  Patient Location: PACU  Anesthesia Type:General  Level of Consciousness: drowsy  Airway & Oxygen Therapy: Patient Spontanous Breathing and Patient connected to face mask oxygen  Post-op Assessment: Report given to RN, Post -op Vital signs reviewed and stable and Patient moving all extremities X 4  Post vital signs: Reviewed and stable  Last Vitals:  Vitals Value Taken Time  BP 135/84   Temp    Pulse 71 10/06/21 1118  Resp 14 10/06/21 1118  SpO2 100 % 10/06/21 1118  Vitals shown include unvalidated device data.  Last Pain:  Vitals:   10/06/21 0606  TempSrc: Oral  PainSc:          Complications: No notable events documented.

## 2021-10-06 NOTE — Interval H&P Note (Signed)
History and Physical Interval Note:  10/06/2021 7:23 AM  Sarah Cooley  has presented today for surgery, with the diagnosis of RIGHT RENAL MASS.  The various methods of treatment have been discussed with the patient and family. After consideration of risks, benefits and other options for treatment, the patient has consented to  Procedure(s): XI ROBOTIC ASSITED RIGHT PARTIAL NEPHRECTOMY (Right) CYSTOSCOPY WITH RETROGRADE PYELOGRAM (Bilateral) as a surgical intervention.  The patient's history has been reviewed, patient examined, no change in status, stable for surgery.  I have reviewed the patient's chart and labs.  Questions were answered to the patient's satisfaction.     Ardis Hughs

## 2021-10-06 NOTE — Discharge Instructions (Signed)

## 2021-10-06 NOTE — H&P (Signed)
f/u for kidney cancer  HPI: Sarah Cooley is a 55 year-old female established patient who is here for kidney cancer follow-up.  The patient is s/p left partial nephrectomy. Her surgery was done approximately 06/19/2018.   Path: T1a RCC - clear cell, 1.5 cm, Furhman II/IV. The resection margins were negative.   Her last CT scan was on 02/02/2021. The results of the CT scan demonstrated 41mm enhancing lesion in right posterior mid-pole.   The last CXR was approximately 12/21/2018. The CXR demonstrated NED.   The patient had labs prior to her office visit today. Pertinent labs: BUN/Cr: 3/22- 20/0.85.   She is not having flank pain. She has had blood in her urine recently. She has not recently had unwanted weight loss. She is not having normal bowel movements. She does have a good appetite. She is having pain in new locations. The patient isnot complaining of incisional hernia or pain.   Hemorrhagic cystitis in 3/21 (cysto negative)   Patient presents today for concern of a potential new finding of an enhancing renal mass in the right mid pole posterior region. This was initially noted as a hyperdense cyst, but on most recent imaging appears to be an enhancing mass, likely papillary type. She had an MRI to confirm this 1 week ago. It measures 1.8 cm. July   Intv: Patient presents today for further discussion regarding upcoming right partial nephrectomy. No voiding symptoms or pain. No hematuria. Doing well otherwise.     ALLERGIES: Adhesive Adhesive Glue Altace - pancreatitis lyrica - Dizziness, Nausea, Headache Minocin - Hives Septra - acute renal failure Shrimp - Anaphylaxis, face swelling and eye swelling    MEDICATIONS: Alprazolam 1 mg tablet  Diltiazem 24Hr Er (Cd) 180 mg capsule, ext release 24 hr  Ergocalciferol  Fluoxetine Hcl 60 mg tablet  Furosemide 20 mg tablet  Hydrocodone-Acetaminophen 7.5 mg-325 mg tablet  Hyoscyamine Sulfate  Losartan Potassium 50 mg tablet  Metamucil   Mirapex  Ondansetron Hcl  Pantoprazole Sodium 40 mg tablet, delayed release  Potassium Chloride  Pravastatin Sodium 10 mg tablet  Propranolol Hcl 60 mg tablet  Topamax 50 mg tablet  Vitamin B12  Vitamin D  Voltaren Arthritis Pain 1 % gel     GU PSH: Cystoscopy - 10/19/2019 Locm 300-399Mg /Ml Iodine,1Ml - 2020 Partial nephrectomy (laparoscopic), Left - 2019       PSH Notes: R knee ACL and Meniscus repair  R wrist graft  right breast lumpectomy with subsequent radiation  spark sling   NON-GU PSH: Carpal tunnel surgery Cholecystectomy (laparoscopic) Hysterectomy/bladder Repair     GU PMH: Renal cell carcinoma, left - 08/01/2021, - 10/19/2019, - 2020, - 4132 Right uncertain neoplasm of kidney - 08/01/2021 Acute Cystitis/UTI - 2021 Gross hematuria - 10/19/2019 Benign Neo Kidney, Unspec - 2019, - 2019, - 2019 Kidney Failure Unspec      PMH Notes: heart palpitations occasionally  fibromyalgia  chronic fatigue  IGM  AV malformation followed by Dr. Floyde Parkins last eval within a year    NON-GU PMH: Anxiety Arrhythmia Arthritis Breast Cancer, History Depression GERD Hypercholesterolemia Hypertension Lyme disease, unspecified Sleep Apnea    FAMILY HISTORY: 1 Daughter - No Family History 1 son - No Family History Breast Cancer - Aunt, Sister Diabetes - Aunt lupus - Mother stroke - Mother, Grandmother   SOCIAL HISTORY: Marital Status: Married Preferred Language: English; Race: White Current Smoking Status: Patient has never smoked.   Tobacco Use Assessment Completed: Used Tobacco in last 30 days? Does not  use smokeless tobacco. Has never drank.  Does not use drugs. Drinks 1 caffeinated drink per day. Has not had a blood transfusion. Patient's occupation Publishing copy.    REVIEW OF SYSTEMS:    GU Review Female:   Patient denies frequent urination, hard to postpone urination, burning /pain with urination, get up at night to urinate, leakage of  urine, stream starts and stops, trouble starting your stream, have to strain to urinate, and being pregnant.  Gastrointestinal (Upper):   Patient denies nausea, vomiting, and indigestion/ heartburn.  Gastrointestinal (Lower):   Patient denies diarrhea and constipation.  Constitutional:   Patient denies fever, night sweats, weight loss, and fatigue.  Skin:   Patient denies skin rash/ lesion and itching.  Eyes:   Patient denies blurred vision and double vision.  Ears/ Nose/ Throat:   Patient denies sore throat and sinus problems.  Hematologic/Lymphatic:   Patient denies swollen glands and easy bruising.  Cardiovascular:   Patient denies leg swelling and chest pains.  Respiratory:   Patient denies cough and shortness of breath.  Endocrine:   Patient denies excessive thirst.  Musculoskeletal:   Patient denies back pain and joint pain.  Neurological:   Patient denies headaches and dizziness.  Psychologic:   Patient denies anxiety and depression.   VITAL SIGNS: None   MULTI-SYSTEM PHYSICAL EXAMINATION:    Constitutional: Well-nourished. No physical deformities. Normally developed. Good grooming.  Respiratory: Normal breath sounds. No labored breathing, no use of accessory muscles.   Cardiovascular: Regular rate and rhythm. No murmur, no gallop. Normal temperature, normal extremity pulses, no swelling, no varicosities.      Complexity of Data:  Source Of History:  Patient  Records Review:   Pathology Reports, Previous Doctor Records, Previous Patient Records, POC Tool  Urine Test Review:   Urinalysis  X-Ray Review: C.T. Abdomen/Pelvis: Reviewed Films. Discussed With Patient.     PROCEDURES: None   ASSESSMENT:      ICD-10 Details  1 GU:   Renal cell carcinoma, right - C64.1   2   Renal cell carcinoma, left - C64.2    PLAN:           Document Letter(s):  Created for Patient: Clinical Summary    The patient has been given the natural history of renal cancer, treatment options, and I  recommended surgical extirpation for this patient. I went over the robotic-assisted laparoscopic partial nephrectomy approach. I described for the patient the procedure in detail including port placement. I detailed the postoperative course including the fact that the patient would have both a drain and a Foley catheter following the surgery. I told the patient that most often patients are discharged on postoperative day one or 2. I then detailed the expected recovery time, I told the patient that he would not be able to lift anything greater than 20 pounds for 4 weeks. I also went over the risks and benefits of this operation in great detail. We discussed the risk of injury to surrounding structures, major blood vessels and nerves, bleeding, infection, loss of kidney, and the risk of recurrent cancer.         Notes:   Has had previous partial on left and tolerated it well.

## 2021-10-06 NOTE — Op Note (Signed)
Preoperative diagnosis:  right renal mass   Postoperative diagnosis:  same  Gross hematuria  Procedure: Robotic assisted laparoscopic right partial nephrectomy cystoscopy  Surgeon: Ardis Hughs, MD 1st assistant: Aldine Contes, MD  Anesthesia: General  Complications: None  Intraoperative findings:  #1.  Cystoscopy demonstrated normal bladder mucosa with orthotopic ureteral orifice ease.  There were no significant abnormalities. #2. Warm ischemia time 11 minutes #3.  The tumor was isolated on the posterior mid pole aspect and identified with the intraoperative ultrasound to confirm the lesion.  EBL: 5 cc  Specimens: right renal mass   Indication:  Sarah Cooley is a 55 y.o. patient with right renal mass and a history of gross hematuria.  After reviewing the management options for treatment, he elected to proceed with the above surgical procedure(s). We have discussed the potential benefits and risks of the procedure, side effects of the proposed treatment, the likelihood of the patient achieving the goals of the procedure, and any potential problems that might occur during the procedure or recuperation. Informed consent has been obtained.   Description of procedure:  An assistant was required for this surgical procedure.  The duties of the assistant included but were not limited to suctioning, passing suture, camera manipulation, retraction. This procedure would not be able to be performed without an Environmental consultant.  The patient was taken to the operating room and a general anesthetic was administered.  For positioning her for the partial nephrectomy we did perform cystoscopy using a flexible cystoscope.  This was done in the routine sterile fashion, and the findings are noted as above.  The patient was given preoperative antibiotics, placed in the right modified flank position with care to pad all potential pressure points, and prepped and draped in the usual sterile fashion.  Next a preoperative timeout was performed.  A site was selected on the right side of the umbilicus for placement of the camera port. This was placed using a standard modified Hassan technique with entry into the peritoneum with a 10 mm 0 deg laparoscope with a visual obturator. We entered the peritoneum without incident and established pneumoperitoneum.  The camera was then used to inspect the abdomen and there was no evidence of any intra-abdominal injuries or other abnormalities. The remaining abdominal ports were then placed. 8 mm robotic ports were placed in the right upper quadrant, right lower quadrant, and far right lateral abdominal wall. A 12 mm port was placed in the upper midline for laparoscopic assistance. Lysis of adhesions were performed at the midline to allow placement of the 12 mm assistant port. All ports were placed under direct vision without difficulty. The surgical cart was then docked.   Utilizing the cautery scissors, the white line of Toldt was incised allowing the colon to be mobilized medially and the plane between the mesocolon and the anterior layer of Gerotas fascia to be developed and the kidney to be exposed.  The ureter and gonadal vein were identified inferiorly and the ureter was lifted anteriorly off the psoas muscle.  Dissection proceeded superiorly along the gonadal vein until the renal vein was identified.  The renal hilum was then carefully isolated with a combination of blunt and sharp dissection allowing the renal arterial and venous structures to be separated and isolated in preparation for renal hilar vessel clamping.  Attention turned to the kidney and the perinephric fat surrounding the renal mass was removed and the kidney was mobilized sufficiently for exposure and resection of the renal mass.  The margins were then demarcated using intraoperative ultrasound.  Once the renal mass was properly isolated, preparations were made for resection of the tumor.    The  renal artery was then clamped with a bulldog clamp.  The tumor was then excised with cold scissor dissection along with an adequate visible gross margin of normal renal parenchyma. The tumor appeared to be excised without any gross violation of the tumor. The renal collecting system was not entered during removal of the tumor.  A running 3-0 V-lock suture was then brought through the capsule of the kidney and run along the base of the renal defect to provide hemostasis and close any entry into the renal collecting system if present. Weck clips were used to secure this suture outside the renal capsule at the proximal and distal ends. The bulldog clamps were then removed from the renal hilar vessel. A running 2-0 V lock suture was then used to close the capsule of the kidney using a sliding clip technique which resulted in excellent hemostasis. An additional hemostatic agent (Surgiflo) was then placed into the renal defect. Surgicel was then placed over the defect.    Total warm renal ischemia time was  11 minutes. The renal tumor resection site was examined. Hemostasis appeared adequate.   The kidney was placed back into its normal anatomic position and covered with perinephric fat as needed.  A # 70 Blake drain was then brought through the lateral lower port site and positioned in the perinephric space.  It was secured to the skin with a nylon suture. The surgical robotic cart was undocked.  The renal tumor specimen was removed intact within an endopouch retrieval bag via the camera port sites.  The camera port site and the other 12 mm port site were then closed at the fascial level with 0-vicryl suture.  All other laparoscopic/robotic ports were removed under direct vision and the pneumoperitoneum let down with inspection of the operative field performed and hemostasis again confirmed. All incision sites were then injected with local anesthetic and reapproximated at the skin level with 4-0 monocryl subcuticular  closures. Dermabond was applied to the skin.  The patient tolerated the procedure well and without complications.  The patient was able to be extubated and transferred to the recovery unit in satisfactory condition.  Ardis Hughs, M.D.

## 2021-10-07 ENCOUNTER — Encounter (HOSPITAL_COMMUNITY): Payer: Self-pay | Admitting: Urology

## 2021-10-07 DIAGNOSIS — C641 Malignant neoplasm of right kidney, except renal pelvis: Secondary | ICD-10-CM | POA: Diagnosis not present

## 2021-10-07 LAB — CBC
HCT: 38.5 % (ref 36.0–46.0)
Hemoglobin: 12.8 g/dL (ref 12.0–15.0)
MCH: 31.3 pg (ref 26.0–34.0)
MCHC: 33.2 g/dL (ref 30.0–36.0)
MCV: 94.1 fL (ref 80.0–100.0)
Platelets: 168 10*3/uL (ref 150–400)
RBC: 4.09 MIL/uL (ref 3.87–5.11)
RDW: 12.2 % (ref 11.5–15.5)
WBC: 11.6 10*3/uL — ABNORMAL HIGH (ref 4.0–10.5)
nRBC: 0 % (ref 0.0–0.2)

## 2021-10-07 LAB — BASIC METABOLIC PANEL
Anion gap: 7 (ref 5–15)
BUN: 12 mg/dL (ref 6–20)
CO2: 23 mmol/L (ref 22–32)
Calcium: 8.5 mg/dL — ABNORMAL LOW (ref 8.9–10.3)
Chloride: 108 mmol/L (ref 98–111)
Creatinine, Ser: 0.97 mg/dL (ref 0.44–1.00)
GFR, Estimated: >60 mL/min (ref >60)
Glucose, Bld: 119 mg/dL — ABNORMAL HIGH (ref 70–99)
Potassium: 3.7 mmol/L (ref 3.5–5.1)
Sodium: 138 mmol/L (ref 135–145)

## 2021-10-07 LAB — CREATININE, FLUID (PLEURAL, PERITONEAL, JP DRAINAGE): Creat, Fluid: 1 mg/dL

## 2021-10-07 NOTE — Progress Notes (Signed)
AVS given to patient and explained at the bedside. Medications and follow up appointments have been explained with pt verbalizing understanding.  

## 2021-10-07 NOTE — Progress Notes (Signed)
1 Day Post-Op Subjective: Pain controlled, no nausea or emesis.  Passing flatus, no bowel movement.  Tolerating clears.  Tolerating Foley.  Afebrile.  Objective: Vital signs in last 24 hours: Temp:  [97.7 F (36.5 C)-98.2 F (36.8 C)] 98 F (36.7 C) (12/17 0502) Pulse Rate:  [69-81] 69 (12/17 0502) Resp:  [10-18] 10 (12/17 0502) BP: (112-145)/(71-97) 114/71 (12/17 0502) SpO2:  [96 %-100 %] 97 % (12/17 0502)  Intake/Output from previous day: 12/16 0701 - 12/17 0700 In: 1423 [I.V.:1223; IV Piggyback:200] Out: 2880 [Urine:2675; Drains:155; Blood:50] Intake/Output this shift: Total I/O In: -  Out: 1420 [Urine:1420]  UOP: 1.4 L clear yellow JP: 55 mL SS  Physical Exam:  General: Alert and oriented CV: RRR Lungs: Clear Abdomen: Soft, ND, ATTP; inc c/d/I Ext: NT, No erythema  Lab Results: Recent Labs    10/06/21 1202 10/07/21 0431  HGB 14.5 12.8  HCT 44.2 38.5   BMET Recent Labs    10/06/21 1202 10/07/21 0431  NA 139 138  K 4.0 3.7  CL 108 108  CO2 24 23  GLUCOSE 157* 119*  BUN 16 12  CREATININE 1.16* 0.97  CALCIUM 8.8* 8.5*     Studies/Results: No results found.  Assessment/Plan: Right renal mass: Robotic assisted laparoscopic right partial nephrectomy 10/06/2021.  Path pending.  -Pain control -Advance to regular diet -Reviewed labs, appropriate.  Hemoglobin is appropriate at 12.8.  Creatinine is 0.97. -Void trial -Check JP creatinine.  Will remove prior to discharge if sero-equivalent. -OOB, amb, IS -She is motivated for discharge today.  Likely discharge after tolerating diet and JP removed.   LOS: 0 days   Matt R. Ann Bohne MD 10/07/2021, 10:04 AM [facility]  Pager: [phone]

## 2021-10-07 NOTE — Care Management (Signed)
°  Transition of Care [facility]) Screening Note   Patient Details  Name: Sarah Cooley Date of Birth: 1966-05-05   Transition of Care [facility]) CM/SW Contact:    Purcell Mouton, RN Phone Number: 10/07/2021, 12:02 PM    Transition of Care Department [facility]) has reviewed patient and no TOC needs have been identified at this time. We will continue to monitor patient advancement through interdisciplinary progression rounds. If new patient transition needs arise, please place a TOC consult.

## 2021-10-07 NOTE — Discharge Summary (Signed)
Date of admission: 10/06/2021  Date of discharge: 10/07/2021  Admission diagnosis: Right renal mass  Discharge diagnosis: Right renal mass  Secondary diagnoses: None  History and Physical: For full details, please see admission history and physical. Briefly, Sarah Cooley is a 55 y.o. year old patient with a right renal mass s/p right robotic assisted laparoscopic partial nephrectomy on 10/06/2021.  Hospital Course: The patient recovered in the usual expected fashion.  She had her diet advanced slowly.  Initially managed with IV pain control, then transitioned to PO meds when she was tolerating oral intake.  Her labs were stable throughout the hospital course.  She was discharged to home on POD#1.  At the time of discharge she was tolerating a regular diet, passing flatus, ambulating, had adequate pain control and was agreeable to discharge. Her foley was removed. Her JP creatinine was consistent with serum and was removed prior to discharge. Follow up as scheduled.    Laboratory values:  Recent Labs    10/06/21 1202 10/07/21 0431  HGB 14.5 12.8  HCT 44.2 38.5   Recent Labs    10/06/21 1202 10/07/21 0431  CREATININE 1.16* 0.97    Disposition: Home  Discharge instruction: The patient was instructed to be ambulatory but told to refrain from heavy lifting, strenuous activity, or driving.  Discharge medications:  Allergies as of 10/07/2021       Reactions   Ramipril Nausea And Vomiting, Other (See Comments)   Ramipril--Caused Pancreatitis   Shellfish-derived Products Anaphylaxis   Brompheniramine-pseudoeph Rash   Minocycline Hcl Hives   Other Rash   ADHESIVE GLUE USED FOR INCISIONS Anti-inflammatory medicine - pt stated, "Upsets my GI tract" Please use paper tape Allergic to Dermabond and viccryl sutures per pt    Lyrica [pregabalin] Nausea And Vomiting   Adhesive [tape] Rash   Please use paper tape Patient is allergic to many adhesives    Bactrim  [sulfamethoxazole-trimethoprim] Other (See Comments)   May have caused kidney issues   Erythromycin Other (See Comments)   GI- Upset / severe abdominal pain   Sinutab [chlorphen-pseudoephed-apap] Rash        Medication List     STOP taking these medications    HYDROcodone-acetaminophen 7.5-325 MG tablet Commonly known as: NORCO       TAKE these medications    acetaminophen 500 MG tablet Commonly known as: TYLENOL Take 1,000 mg by mouth every 6 (six) hours as needed for moderate pain.   ALPRAZolam 1 MG tablet Commonly known as: XANAX Take 1 mg by mouth daily as needed for anxiety.   BENEFIBER PO Take 1 Scoop by mouth daily.   cyanocobalamin 1000 MCG/ML injection Commonly known as: (VITAMIN B-12) Inject 1 mL (1,000 mcg total) into the muscle every 30 (thirty) days.   Dialyvite Vitamin D 5000 125 MCG (5000 UT) capsule Generic drug: Cholecalciferol Take 5,000 Units by mouth daily.   diazepam 10 MG tablet Commonly known as: VALIUM Take 10 mg by mouth at bedtime.   diclofenac Sodium 1 % Gel Commonly known as: VOLTAREN 1 application 4 (four) times daily as needed (pain).   diltiazem 180 MG 24 hr capsule Commonly known as: CARDIZEM CD TAKE ONE CAPSULE BY MOUTH DAILY What changed: when to take this   hyoscyamine 0.125 MG tablet Commonly known as: LEVSIN Take 0.125 mg by mouth 3 (three) times daily before meals.   levocetirizine 5 MG tablet Commonly known as: XYZAL Take 5 mg by mouth daily as needed for allergies.  losartan 25 MG tablet Commonly known as: COZAAR Take 25 mg by mouth daily.   ondansetron 4 MG tablet Commonly known as: ZOFRAN TAKE 1 TABLET BY MOUTH EVERY 8 HOURS AS NEEDED FOR FOR NAUSEA OR VOMITING   oxyCODONE 5 MG immediate release tablet Commonly known as: Roxicodone Take 1-2 tablets (5-10 mg total) by mouth every 4 (four) hours as needed.   pantoprazole 40 MG tablet Commonly known as: PROTONIX TAKE ONE TABLET BY MOUTH TWICE A DAY    pramipexole 0.125 MG tablet Commonly known as: MIRAPEX TAKE ONE TAB BY MOUTH DAILY AT DINNERTIME AND TAKE TWO TABS BY MOUTH AT BEDTIME. PLEASE CALL [phone] TO SCHEDULE APPT.   pravastatin 10 MG tablet Commonly known as: PRAVACHOL Take 10 mg by mouth at bedtime.   primidone 50 MG tablet Commonly known as: MYSOLINE Take 50 mg by mouth at bedtime.   propranolol ER 80 MG 24 hr capsule Commonly known as: INDERAL LA Take 80 mg by mouth at bedtime.   topiramate 50 MG tablet Commonly known as: TOPAMAX Take 2 in the morning, take 3 at night   venlafaxine XR 37.5 MG 24 hr capsule Commonly known as: EFFEXOR-XR Take 37.5 mg by mouth at bedtime.        Followup:   Follow-up Information     Hollace Hayward, NP Follow up on 10/20/2021.   Why: 1:45pm Contact information: [address] [place] [address] [phone]                 Matt R. [facility]  Pager: [phone]

## 2021-10-09 LAB — SURGICAL PATHOLOGY

## 2021-10-19 ENCOUNTER — Encounter (HOSPITAL_COMMUNITY): Payer: Self-pay | Admitting: Hematology

## 2021-10-22 ENCOUNTER — Other Ambulatory Visit: Payer: Self-pay | Admitting: Cardiology

## 2021-11-07 ENCOUNTER — Other Ambulatory Visit: Payer: Self-pay | Admitting: Urology

## 2021-11-07 DIAGNOSIS — C649 Malignant neoplasm of unspecified kidney, except renal pelvis: Secondary | ICD-10-CM

## 2021-11-09 ENCOUNTER — Ambulatory Visit (HOSPITAL_COMMUNITY): Payer: Medicare HMO | Admitting: Hematology

## 2021-11-19 ENCOUNTER — Other Ambulatory Visit: Payer: Self-pay | Admitting: Cardiology

## 2021-11-20 NOTE — Telephone Encounter (Signed)
Patient last seen 04/26/2020 - attempted to contact patient to make appointment but voice mail not set up.

## 2022-04-12 ENCOUNTER — Other Ambulatory Visit (HOSPITAL_COMMUNITY): Payer: Self-pay | Admitting: Oncology

## 2022-08-17 LAB — UIFE/LIGHT CHAINS/TP QN, 24-HR UR
FR KAPPA LT CH,24HR: 28.69 mg/(24.h)
FR LAMBDA LT CH,24HR: 3.39 mg/(24.h)
Free Kappa Lt Chains,Ur: 11.1 mg/L (ref 1.17–86.46)
Free Kappa/Lambda Ratio: 8.47 (ref 1.83–14.26)
Free Lambda Lt Chains,Ur: 1.31 mg/L (ref 0.27–15.21)
Total Protein, Urine-Ur/day: 155 mg/(24.h) — ABNORMAL HIGH (ref 30–150)
Total Protein, Urine: 6 mg/dL

## 2023-06-17 ENCOUNTER — Ambulatory Visit (HOSPITAL_COMMUNITY)
Admission: RE | Admit: 2023-06-17 | Discharge: 2023-06-17 | Disposition: A | Discharge status: Home / Self Care | Payer: Medicare HMO | Source: Ambulatory Visit | Attending: Urology | Admitting: Urology

## 2023-06-17 ENCOUNTER — Other Ambulatory Visit (HOSPITAL_COMMUNITY): Payer: Self-pay | Admitting: Urology

## 2023-06-17 DIAGNOSIS — C642 Malignant neoplasm of left kidney, except renal pelvis: Secondary | ICD-10-CM | POA: Diagnosis present

## 2024-06-04 ENCOUNTER — Other Ambulatory Visit: Payer: Self-pay
